# Patient Record
Sex: Male | Born: 1955 | Race: Black or African American | Hispanic: No | Marital: Single | Smoking: Former smoker
Health system: Southern US, Community
[De-identification: ages and names within clinical notes are randomized; demographics above are authoritative.]

## PROBLEM LIST (undated history)

## (undated) DIAGNOSIS — Z741 Need for assistance with personal care: Secondary | ICD-10-CM

## (undated) DIAGNOSIS — J154 Pneumonia due to other streptococci: Secondary | ICD-10-CM

## (undated) DIAGNOSIS — I739 Peripheral vascular disease, unspecified: Secondary | ICD-10-CM

## (undated) DIAGNOSIS — I509 Heart failure, unspecified: Secondary | ICD-10-CM

## (undated) DIAGNOSIS — E875 Hyperkalemia: Secondary | ICD-10-CM

## (undated) DIAGNOSIS — Z9119 Patient's noncompliance with other medical treatment and regimen: Secondary | ICD-10-CM

## (undated) DIAGNOSIS — I639 Cerebral infarction, unspecified: Secondary | ICD-10-CM

## (undated) DIAGNOSIS — N400 Enlarged prostate without lower urinary tract symptoms: Secondary | ICD-10-CM

## (undated) DIAGNOSIS — R339 Retention of urine, unspecified: Secondary | ICD-10-CM

## (undated) DIAGNOSIS — J189 Pneumonia, unspecified organism: Secondary | ICD-10-CM

## (undated) DIAGNOSIS — F1910 Other psychoactive substance abuse, uncomplicated: Secondary | ICD-10-CM

## (undated) DIAGNOSIS — N179 Acute kidney failure, unspecified: Secondary | ICD-10-CM

## (undated) DIAGNOSIS — Z993 Dependence on wheelchair: Secondary | ICD-10-CM

## (undated) DIAGNOSIS — Z91199 Patient's noncompliance with other medical treatment and regimen due to unspecified reason: Secondary | ICD-10-CM

## (undated) DIAGNOSIS — I10 Essential (primary) hypertension: Secondary | ICD-10-CM

## (undated) DIAGNOSIS — I96 Gangrene, not elsewhere classified: Secondary | ICD-10-CM

## (undated) DIAGNOSIS — D649 Anemia, unspecified: Secondary | ICD-10-CM

## (undated) DIAGNOSIS — E785 Hyperlipidemia, unspecified: Secondary | ICD-10-CM

## (undated) DIAGNOSIS — Z89619 Acquired absence of unspecified leg above knee: Secondary | ICD-10-CM

## (undated) HISTORY — DX: Retention of urine, unspecified: R33.9

## (undated) HISTORY — DX: Hyperkalemia: E87.5

## (undated) HISTORY — PX: JOINT REPLACEMENT: SHX530

## (undated) HISTORY — DX: Acute kidney failure, unspecified: N17.9

## (undated) HISTORY — DX: Gangrene, not elsewhere classified: I96

## (undated) HISTORY — DX: Anemia, unspecified: D64.9

## (undated) HISTORY — PX: TOTAL HIP ARTHROPLASTY: SHX124

## (undated) HISTORY — DX: Pneumonia due to other streptococci: J15.4

## (undated) HISTORY — DX: Hyperlipidemia, unspecified: E78.5

## (undated) HISTORY — DX: Peripheral vascular disease, unspecified: I73.9

---

## 2000-08-25 ENCOUNTER — Encounter: Admission: RE | Admit: 2000-08-25 | Discharge: 2000-08-25 | Payer: Self-pay | Admitting: Internal Medicine

## 2001-05-18 ENCOUNTER — Emergency Department (HOSPITAL_COMMUNITY): Admission: EM | Admit: 2001-05-18 | Discharge: 2001-05-18 | Payer: Self-pay | Admitting: Emergency Medicine

## 2002-03-18 ENCOUNTER — Emergency Department (HOSPITAL_COMMUNITY): Admission: EM | Admit: 2002-03-18 | Discharge: 2002-03-18 | Payer: Self-pay | Admitting: Emergency Medicine

## 2002-03-18 ENCOUNTER — Encounter: Payer: Self-pay | Admitting: Emergency Medicine

## 2003-03-03 ENCOUNTER — Emergency Department (HOSPITAL_COMMUNITY): Admission: EM | Admit: 2003-03-03 | Discharge: 2003-03-04 | Payer: Self-pay

## 2003-03-23 ENCOUNTER — Emergency Department (HOSPITAL_COMMUNITY): Admission: EM | Admit: 2003-03-23 | Discharge: 2003-03-23 | Payer: Self-pay | Admitting: Emergency Medicine

## 2003-10-31 ENCOUNTER — Emergency Department (HOSPITAL_COMMUNITY): Admission: EM | Admit: 2003-10-31 | Discharge: 2003-10-31 | Payer: Self-pay | Admitting: Emergency Medicine

## 2004-01-24 ENCOUNTER — Emergency Department (HOSPITAL_COMMUNITY): Admission: EM | Admit: 2004-01-24 | Discharge: 2004-01-24 | Payer: Self-pay | Admitting: Emergency Medicine

## 2006-03-13 ENCOUNTER — Emergency Department (HOSPITAL_COMMUNITY): Admission: EM | Admit: 2006-03-13 | Discharge: 2006-03-13 | Payer: Self-pay | Admitting: Emergency Medicine

## 2006-12-09 ENCOUNTER — Emergency Department (HOSPITAL_COMMUNITY): Admission: EM | Admit: 2006-12-09 | Discharge: 2006-12-09 | Payer: Self-pay | Admitting: Emergency Medicine

## 2007-01-03 ENCOUNTER — Emergency Department (HOSPITAL_COMMUNITY): Admission: EM | Admit: 2007-01-03 | Discharge: 2007-01-03 | Payer: Self-pay | Admitting: Family Medicine

## 2007-02-19 ENCOUNTER — Emergency Department (HOSPITAL_COMMUNITY): Admission: EM | Admit: 2007-02-19 | Discharge: 2007-02-19 | Payer: Self-pay | Admitting: Emergency Medicine

## 2007-03-02 ENCOUNTER — Emergency Department (HOSPITAL_COMMUNITY): Admission: EM | Admit: 2007-03-02 | Discharge: 2007-03-02 | Payer: Self-pay | Admitting: Family Medicine

## 2007-08-28 ENCOUNTER — Emergency Department (HOSPITAL_COMMUNITY): Admission: EM | Admit: 2007-08-28 | Discharge: 2007-08-28 | Payer: Self-pay | Admitting: Emergency Medicine

## 2007-11-28 ENCOUNTER — Emergency Department (HOSPITAL_COMMUNITY): Admission: EM | Admit: 2007-11-28 | Discharge: 2007-11-28 | Payer: Self-pay | Admitting: Emergency Medicine

## 2010-01-21 ENCOUNTER — Inpatient Hospital Stay (HOSPITAL_COMMUNITY)
Admission: EM | Admit: 2010-01-21 | Discharge: 2010-02-05 | Payer: Self-pay | Source: Home / Self Care | Attending: Internal Medicine | Admitting: Internal Medicine

## 2010-01-22 ENCOUNTER — Encounter (INDEPENDENT_AMBULATORY_CARE_PROVIDER_SITE_OTHER): Payer: Self-pay | Admitting: Internal Medicine

## 2010-03-02 NOTE — Consult Note (Addendum)
NAME:  Jeremy, Howell NO.:  [id number]  MEDICAL RECORD NO.:  [id number]           PATIENT TYPE:  LOCATION:                                 FACILITY:  PHYSICIAN:  Bevelyn Buckles. Bensimhon, MDDATE OF BIRTH:  Apr 04, 1955  DATE OF CONSULTATION:  01/22/2010 DATE OF DISCHARGE:                                CONSULTATION   PRIMARY CARE PHYSICIAN:  Fleet Contras, MD  PRIMARY CARDIOLOGIST:  None.  CHIEF COMPLAINT:  Possible MI.  HISTORY OF PRESENT ILLNESS:  Jeremy Howell is a 55 year old male with no previous history of coronary artery disease.  He has had an abnormal EKG for several years.  He was admitted yesterday with a CVA.  His troponin was elevated, so Cardiology was asked to evaluate him.  Jeremy Howell is awake and seems to say yes or no answers to questions. However, the answers are not necessarily responsive to the questions. He does deny chest pain or shortness of breath.  He denies any pain at all.  He responds to some simple commands.  Most of the data was obtained from the chart and some of it from the patient.  Family is not present.  PAST MEDICAL HISTORY: 1. Hypertension. 2. Polysubstance abuse with cocaine, heroin, THC, and alcohol. 3. History of tobacco use. 4. History of noncompliance with medications.  SURGICAL HISTORY:  Hip replacement.  ALLERGIES:  No known drug allergies per old records.  CURRENT MEDICATIONS: 1. Aspirin 325 mg daily. 2. Metoprolol 25 mg b.i.d. 3. Avelox 400 mg daily. 4. IV nitroglycerin at 45 mcg per minute. 5. Thiamine 50 mg IV daily.  SOCIAL HISTORY:  He lives in [place] with family.  He is unemployed. He has a greater than 30 pack-year history of tobacco use as well as ongoing alcohol or drug abuse.  FAMILY HISTORY:  The patient indicates that both his parents are deceased and neither of his parents nor any siblings have heart disease.  REVIEW OF SYSTEMS:  Essentially not obtainable because of the  patient's condition.  PHYSICAL EXAMINATION:  VITAL SIGNS:  Temperature 99.2, blood pressure 178/88, pulse 64, respiratory rate 14, O2 saturation 98% on room air. GENERAL:  He is a slender [place] American male who responds to verbal stimulus. HEENT:  Essentially normal with the exception of a possible left facial droop. NECK:  There is no lymphadenopathy, thyromegaly, or bruit, and JVD is approximately 8 cm. CVA:  His heart is regular in rate and rhythm with an S1 and S2 and no significant murmur, rub, or gallop is noted.  Distal pulses are intact in all four extremities. LUNGS:  Clear to auscultation anteriorly. SKIN:  No rashes or lesions are noted. ABDOMEN:  Soft and nontender with active bowel sounds. EXTREMITIES:  There is no cyanosis, clubbing, or edema noted. MUSCULOSKELETAL:  There is no joint deformity or effusions. NEUROLOGIC:  He is awake and moves all four extremities.  He indicated that he did not know where he was.  There is possibly a slight left facial droop and he seems to have left-sided weakness.  Chest x-ray, possible infiltrate in the left lower lobe but no  edema.  CT of the head shows a relatively acute infarct in the left lateral temporal lobes with mass effect as well as a small focus of encephalomalacia at the high right parietal lobe that likely reflects a prior infarct.  EKG, sinus rhythm, rate 60, with deeper T-wave inversions from an EKG dated 2009.  LABORATORY VALUES:  Hemoglobin 14.2, hematocrit 43.6, WBCs 3.7, platelets 282,000.  Sodium 135, potassium 3.5, chloride 104, CO2 24, BUN 11, creatinine 1.4, glucose 81.  EtOH 18.  Urine drug screen positive for cocaine, opiates, and THC.  CK-MB #1 85/4.0, then 79/2.8; troponin-I 0.38, then 0.54.  Total cholesterol 189, triglycerides 55, HDL 42 LDL 136.  IMPRESSION:  Jeremy Howell was seen today by Dr. Gala Romney, the patient evaluated and he data reviewed.  He is a 55 year old male with a history of  hypertension and polysubstance abuse who is admitted with an acute cerebrovascular accident.  His troponin is mildly elevated.  His EKG shows left ventricular hypertrophy with repolarization changes.  He has marked worsening of T-waves and we suspect that the troponin increase in the T-waves are secondary to the cerebrovascular accident though we cannot fully exclude some myocardial ischemia.  That said, he is chest pain free so we would not pursue invasive workup in the setting of a CVA.  He has been appropriately placed on aspirin.  A beta-blocker is also appropriate, but Coreg or labetalol would be better because they have both alpha and beta-blocking effects.  We will therefore change him to Coreg.  We will follow up on the echo that has already been ordered and add a statin.  Consideration can be given to a Lexiscan Myoview prior to discharge if he complains of ischemic symptoms or his enzymes show additional increases.    Jeremy Demark, PA-C   ______________________________ Bevelyn Buckles. Bensimhon, MD   RB/MEDQ  D:  01/22/2010  T:  01/23/2010  Job:  [id number]  Electronically Signed by Jeremy Demark PA-C on 02/20/2010 05:01:28 PM Electronically Signed by Arvilla Meres MD on 03/02/2010 07:12:46 PM

## 2010-04-14 ENCOUNTER — Ambulatory Visit: Payer: Medicare Other

## 2010-04-14 ENCOUNTER — Ambulatory Visit: Payer: Medicare Other | Attending: Neurology | Admitting: Rehabilitative and Restorative Service Providers"

## 2010-04-14 DIAGNOSIS — Z5189 Encounter for other specified aftercare: Secondary | ICD-10-CM | POA: Insufficient documentation

## 2010-04-14 DIAGNOSIS — M6281 Muscle weakness (generalized): Secondary | ICD-10-CM | POA: Insufficient documentation

## 2010-04-14 DIAGNOSIS — I69998 Other sequelae following unspecified cerebrovascular disease: Secondary | ICD-10-CM | POA: Insufficient documentation

## 2010-04-14 DIAGNOSIS — M2420 Disorder of ligament, unspecified site: Secondary | ICD-10-CM | POA: Insufficient documentation

## 2010-04-14 DIAGNOSIS — M2550 Pain in unspecified joint: Secondary | ICD-10-CM | POA: Insufficient documentation

## 2010-04-14 DIAGNOSIS — I69920 Aphasia following unspecified cerebrovascular disease: Secondary | ICD-10-CM | POA: Insufficient documentation

## 2010-04-14 DIAGNOSIS — R269 Unspecified abnormalities of gait and mobility: Secondary | ICD-10-CM | POA: Insufficient documentation

## 2010-04-14 DIAGNOSIS — M2560 Stiffness of unspecified joint, not elsewhere classified: Secondary | ICD-10-CM | POA: Insufficient documentation

## 2010-04-14 DIAGNOSIS — M629 Disorder of muscle, unspecified: Secondary | ICD-10-CM | POA: Insufficient documentation

## 2010-04-15 ENCOUNTER — Ambulatory Visit: Payer: Medicare Other | Admitting: Occupational Therapy

## 2010-04-21 LAB — BLOOD GAS, ARTERIAL
Acid-base deficit: 0.9 mmol/L (ref 0.0–2.0)
Drawn by: 129711
O2 Saturation: 97.3 %
TCO2: 23.5 mmol/L (ref 0–100)
pO2, Arterial: 90.9 mmHg (ref 80.0–100.0)

## 2010-04-21 LAB — GLUCOSE, CAPILLARY
Comment 2: 282828
Glucose-Capillary: 100 mg/dL — ABNORMAL HIGH (ref 70–99)
Glucose-Capillary: 101 mg/dL — ABNORMAL HIGH (ref 70–99)
Glucose-Capillary: 103 mg/dL — ABNORMAL HIGH (ref 70–99)
Glucose-Capillary: 107 mg/dL — ABNORMAL HIGH (ref 70–99)
Glucose-Capillary: 109 mg/dL — ABNORMAL HIGH (ref 70–99)
Glucose-Capillary: 112 mg/dL — ABNORMAL HIGH (ref 70–99)
Glucose-Capillary: 113 mg/dL — ABNORMAL HIGH (ref 70–99)
Glucose-Capillary: 114 mg/dL — ABNORMAL HIGH (ref 70–99)
Glucose-Capillary: 116 mg/dL — ABNORMAL HIGH (ref 70–99)
Glucose-Capillary: 117 mg/dL — ABNORMAL HIGH (ref 70–99)
Glucose-Capillary: 131 mg/dL — ABNORMAL HIGH (ref 70–99)
Glucose-Capillary: 154 mg/dL — ABNORMAL HIGH (ref 70–99)
Glucose-Capillary: 256 mg/dL — ABNORMAL HIGH (ref 70–99)
Glucose-Capillary: 74 mg/dL (ref 70–99)
Glucose-Capillary: 78 mg/dL (ref 70–99)
Glucose-Capillary: 82 mg/dL (ref 70–99)
Glucose-Capillary: 87 mg/dL (ref 70–99)
Glucose-Capillary: 90 mg/dL (ref 70–99)
Glucose-Capillary: 96 mg/dL (ref 70–99)
Glucose-Capillary: 97 mg/dL (ref 70–99)
Glucose-Capillary: 99 mg/dL (ref 70–99)

## 2010-04-21 LAB — DIFFERENTIAL
Basophils Absolute: 0 10*3/uL (ref 0.0–0.1)
Eosinophils Relative: 2 % (ref 0–5)
Lymphocytes Relative: 38 % (ref 12–46)
Lymphs Abs: 1.4 10*3/uL (ref 0.7–4.0)
Monocytes Absolute: 0.4 10*3/uL (ref 0.1–1.0)
Monocytes Relative: 12 % (ref 3–12)

## 2010-04-21 LAB — CARDIAC PANEL(CRET KIN+CKTOT+MB+TROPI)
CK, MB: 1.8 ng/mL (ref 0.3–4.0)
CK, MB: 1.9 ng/mL (ref 0.3–4.0)
CK, MB: 1.9 ng/mL (ref 0.3–4.0)
CK, MB: 2.8 ng/mL (ref 0.3–4.0)
Relative Index: INVALID (ref 0.0–2.5)
Relative Index: INVALID (ref 0.0–2.5)
Total CK: 78 U/L (ref 7–232)
Total CK: 79 U/L (ref 7–232)
Troponin I: 0.32 ng/mL — ABNORMAL HIGH (ref 0.00–0.06)
Troponin I: 0.54 ng/mL (ref 0.00–0.06)

## 2010-04-21 LAB — BASIC METABOLIC PANEL
BUN: 12 mg/dL (ref 6–23)
BUN: 26 mg/dL — ABNORMAL HIGH (ref 6–23)
BUN: 28 mg/dL — ABNORMAL HIGH (ref 6–23)
BUN: 8 mg/dL (ref 6–23)
CO2: 21 mEq/L (ref 19–32)
CO2: 21 mEq/L (ref 19–32)
CO2: 22 mEq/L (ref 19–32)
CO2: 24 mEq/L (ref 19–32)
CO2: 24 mEq/L (ref 19–32)
CO2: 24 mEq/L (ref 19–32)
CO2: 27 mEq/L (ref 19–32)
Calcium: 8.9 mg/dL (ref 8.4–10.5)
Calcium: 9.2 mg/dL (ref 8.4–10.5)
Calcium: 9.3 mg/dL (ref 8.4–10.5)
Calcium: 9.5 mg/dL (ref 8.4–10.5)
Calcium: 9.6 mg/dL (ref 8.4–10.5)
Calcium: 9.6 mg/dL (ref 8.4–10.5)
Chloride: 105 mEq/L (ref 96–112)
Chloride: 108 mEq/L (ref 96–112)
Chloride: 109 mEq/L (ref 96–112)
Chloride: 111 mEq/L (ref 96–112)
Chloride: 112 mEq/L (ref 96–112)
Creatinine, Ser: 1.03 mg/dL (ref 0.4–1.5)
Creatinine, Ser: 1.18 mg/dL (ref 0.4–1.5)
Creatinine, Ser: 1.22 mg/dL (ref 0.4–1.5)
Creatinine, Ser: 1.33 mg/dL (ref 0.4–1.5)
Creatinine, Ser: 1.36 mg/dL (ref 0.4–1.5)
Creatinine, Ser: 1.72 mg/dL — ABNORMAL HIGH (ref 0.4–1.5)
Creatinine, Ser: 1.76 mg/dL — ABNORMAL HIGH (ref 0.4–1.5)
GFR calc Af Amer: 49 mL/min — ABNORMAL LOW (ref >60)
GFR calc Af Amer: 50 mL/min — ABNORMAL LOW (ref >60)
GFR calc Af Amer: >60 mL/min (ref >60)
GFR calc Af Amer: >60 mL/min (ref >60)
GFR calc non Af Amer: 40 mL/min — ABNORMAL LOW (ref >60)
GFR calc non Af Amer: >60 mL/min (ref >60)
Glucose, Bld: 100 mg/dL — ABNORMAL HIGH (ref 70–99)
Glucose, Bld: 104 mg/dL — ABNORMAL HIGH (ref 70–99)
Glucose, Bld: 81 mg/dL (ref 70–99)
Glucose, Bld: 87 mg/dL (ref 70–99)
Glucose, Bld: 93 mg/dL (ref 70–99)
Glucose, Bld: 95 mg/dL (ref 70–99)
Potassium: 3.7 mEq/L (ref 3.5–5.1)
Potassium: 4.1 mEq/L (ref 3.5–5.1)
Sodium: 140 mEq/L (ref 135–145)
Sodium: 143 mEq/L (ref 135–145)
Sodium: 146 mEq/L — ABNORMAL HIGH (ref 135–145)

## 2010-04-21 LAB — CBC
HCT: 48.7 % (ref 39.0–52.0)
HCT: 50.2 % (ref 39.0–52.0)
HCT: 43.6 % (ref 39.0–52.0)
Hemoglobin: 16.8 g/dL (ref 13.0–17.0)
MCH: 27.1 pg (ref 26.0–34.0)
MCH: 27.2 pg (ref 26.0–34.0)
MCH: 27.4 pg (ref 26.0–34.0)
MCHC: 33.5 g/dL (ref 30.0–36.0)
MCHC: 34 g/dL (ref 30.0–36.0)
MCHC: 34.2 g/dL (ref 30.0–36.0)
MCHC: 34.2 g/dL (ref 30.0–36.0)
MCV: 80.1 fL (ref 78.0–100.0)
MCV: 80.2 fL (ref 78.0–100.0)
MCV: 80.3 fL (ref 78.0–100.0)
MCV: 81.6 fL (ref 78.0–100.0)
Platelets: 319 10*3/uL (ref 150–400)
Platelets: 337 10*3/uL (ref 150–400)
Platelets: 366 10*3/uL (ref 150–400)
Platelets: 282 10*3/uL (ref 150–400)
RBC: 5.83 MIL/uL — ABNORMAL HIGH (ref 4.22–5.81)
RBC: 6.2 MIL/uL — ABNORMAL HIGH (ref 4.22–5.81)
RBC: 5.34 MIL/uL (ref 4.22–5.81)
RDW: 14.4 % (ref 11.5–15.5)
RDW: 14.4 % (ref 11.5–15.5)
RDW: 14.5 % (ref 11.5–15.5)
WBC: 4.4 10*3/uL (ref 4.0–10.5)
WBC: 5.8 10*3/uL (ref 4.0–10.5)
WBC: 3.7 10*3/uL — ABNORMAL LOW (ref 4.0–10.5)

## 2010-04-21 LAB — COMPREHENSIVE METABOLIC PANEL
AST: 17 U/L (ref 0–37)
Albumin: 3.6 g/dL (ref 3.5–5.2)
Chloride: 104 mEq/L (ref 96–112)
Creatinine, Ser: 1.4 mg/dL (ref 0.4–1.5)
GFR calc Af Amer: >60 mL/min (ref >60)
Total Bilirubin: 0.4 mg/dL (ref 0.3–1.2)

## 2010-04-21 LAB — URINE CULTURE
Colony Count: NO GROWTH
Culture  Setup Time: 201112140049
Culture: NO GROWTH

## 2010-04-21 LAB — CULTURE, BLOOD (ROUTINE X 2)
Culture  Setup Time: 201112141949
Culture: NO GROWTH

## 2010-04-21 LAB — TROPONIN I: Troponin I: 0.38 ng/mL — ABNORMAL HIGH (ref 0.00–0.06)

## 2010-04-21 LAB — LACTIC ACID, PLASMA: Lactic Acid, Venous: 2 mmol/L (ref 0.5–2.2)

## 2010-04-21 LAB — APTT: aPTT: 31 seconds (ref 24–37)

## 2010-04-21 LAB — LIPID PANEL
HDL: 42 mg/dL (ref >39)
Total CHOL/HDL Ratio: 4.5 RATIO
VLDL: 11 mg/dL (ref 0–40)

## 2010-04-21 LAB — VITAMIN B12: Vitamin B-12: 1023 pg/mL — ABNORMAL HIGH (ref 211–911)

## 2010-04-21 LAB — RAPID URINE DRUG SCREEN, HOSP PERFORMED
Cocaine: POSITIVE — AB
Opiates: POSITIVE — AB

## 2010-04-21 LAB — POCT I-STAT 3, VENOUS BLOOD GAS (G3P V)
pCO2, Ven: 42 mmHg — ABNORMAL LOW (ref 45.0–50.0)
pH, Ven: 7.386 — ABNORMAL HIGH (ref 7.250–7.300)

## 2010-04-21 LAB — MRSA PCR SCREENING: MRSA by PCR: NEGATIVE

## 2010-04-21 LAB — URINALYSIS, ROUTINE W REFLEX MICROSCOPIC
Glucose, UA: NEGATIVE mg/dL
Ketones, ur: NEGATIVE mg/dL
pH: 5.5 (ref 5.0–8.0)

## 2010-04-21 LAB — MAGNESIUM: Magnesium: 2.3 mg/dL (ref 1.5–2.5)

## 2010-04-21 LAB — HEPATITIS C ANTIBODY: HCV Ab: NEGATIVE

## 2010-04-21 LAB — CK TOTAL AND CKMB (NOT AT ARMC): CK, MB: 4 ng/mL (ref 0.3–4.0)

## 2010-04-21 LAB — TSH: TSH: 1.296 u[IU]/mL (ref 0.350–4.500)

## 2010-04-21 LAB — HEPATITIS B CORE ANTIBODY, TOTAL: Hep B Core Total Ab: NEGATIVE

## 2010-04-25 ENCOUNTER — Ambulatory Visit (HOSPITAL_COMMUNITY)
Admission: RE | Admit: 2010-04-25 | Discharge: 2010-04-25 | Disposition: A | Discharge status: Home / Self Care | Payer: Medicare Other | Source: Ambulatory Visit | Attending: Internal Medicine | Admitting: Internal Medicine

## 2010-04-25 ENCOUNTER — Other Ambulatory Visit (HOSPITAL_COMMUNITY): Payer: Self-pay | Admitting: Internal Medicine

## 2010-04-25 DIAGNOSIS — M7989 Other specified soft tissue disorders: Secondary | ICD-10-CM | POA: Insufficient documentation

## 2010-04-25 DIAGNOSIS — M79609 Pain in unspecified limb: Secondary | ICD-10-CM | POA: Insufficient documentation

## 2010-04-25 DIAGNOSIS — R52 Pain, unspecified: Secondary | ICD-10-CM

## 2010-04-25 DIAGNOSIS — M2540 Effusion, unspecified joint: Secondary | ICD-10-CM

## 2010-04-28 ENCOUNTER — Ambulatory Visit: Payer: Medicare Other

## 2010-04-28 ENCOUNTER — Ambulatory Visit: Payer: Medicare Other | Admitting: Occupational Therapy

## 2010-04-28 ENCOUNTER — Ambulatory Visit: Payer: Medicare Other | Admitting: Rehabilitative and Restorative Service Providers"

## 2010-04-29 ENCOUNTER — Ambulatory Visit: Payer: Medicare Other

## 2010-04-29 ENCOUNTER — Ambulatory Visit: Payer: Medicare Other | Admitting: Rehabilitative and Restorative Service Providers"

## 2010-04-29 ENCOUNTER — Ambulatory Visit: Payer: Medicare Other | Admitting: Occupational Therapy

## 2010-05-05 ENCOUNTER — Ambulatory Visit: Payer: Medicare Other | Admitting: Rehabilitative and Restorative Service Providers"

## 2010-05-05 ENCOUNTER — Ambulatory Visit: Payer: Medicare Other | Admitting: Occupational Therapy

## 2010-05-05 ENCOUNTER — Ambulatory Visit: Payer: Medicare Other | Admitting: Speech Pathology

## 2010-05-06 ENCOUNTER — Ambulatory Visit: Payer: Medicare Other | Admitting: Rehabilitative and Restorative Service Providers"

## 2010-05-06 ENCOUNTER — Ambulatory Visit: Payer: Medicare Other

## 2010-05-06 ENCOUNTER — Ambulatory Visit: Payer: Medicare Other | Admitting: Occupational Therapy

## 2010-05-07 ENCOUNTER — Encounter: Payer: Medicare Other | Admitting: Occupational Therapy

## 2010-05-12 ENCOUNTER — Ambulatory Visit: Payer: Medicare Other

## 2010-05-12 ENCOUNTER — Ambulatory Visit: Payer: Medicare Other | Attending: Neurology | Admitting: Rehabilitative and Restorative Service Providers"

## 2010-05-12 ENCOUNTER — Ambulatory Visit: Payer: Medicare Other | Admitting: Occupational Therapy

## 2010-05-12 DIAGNOSIS — M2560 Stiffness of unspecified joint, not elsewhere classified: Secondary | ICD-10-CM | POA: Insufficient documentation

## 2010-05-12 DIAGNOSIS — M629 Disorder of muscle, unspecified: Secondary | ICD-10-CM | POA: Insufficient documentation

## 2010-05-12 DIAGNOSIS — Z5189 Encounter for other specified aftercare: Secondary | ICD-10-CM | POA: Insufficient documentation

## 2010-05-12 DIAGNOSIS — M2550 Pain in unspecified joint: Secondary | ICD-10-CM | POA: Insufficient documentation

## 2010-05-12 DIAGNOSIS — I69998 Other sequelae following unspecified cerebrovascular disease: Secondary | ICD-10-CM | POA: Insufficient documentation

## 2010-05-12 DIAGNOSIS — R269 Unspecified abnormalities of gait and mobility: Secondary | ICD-10-CM | POA: Insufficient documentation

## 2010-05-12 DIAGNOSIS — I69920 Aphasia following unspecified cerebrovascular disease: Secondary | ICD-10-CM | POA: Insufficient documentation

## 2010-05-12 DIAGNOSIS — M2420 Disorder of ligament, unspecified site: Secondary | ICD-10-CM | POA: Insufficient documentation

## 2010-05-12 DIAGNOSIS — M6281 Muscle weakness (generalized): Secondary | ICD-10-CM | POA: Insufficient documentation

## 2010-05-14 ENCOUNTER — Encounter: Payer: Medicare Other | Admitting: Occupational Therapy

## 2010-05-14 ENCOUNTER — Ambulatory Visit: Payer: Medicare Other | Admitting: Physical Therapy

## 2010-05-19 ENCOUNTER — Ambulatory Visit: Payer: Medicare Other | Admitting: Occupational Therapy

## 2010-05-19 ENCOUNTER — Ambulatory Visit: Payer: Medicare Other

## 2010-05-19 ENCOUNTER — Ambulatory Visit: Payer: Medicare Other | Admitting: Rehabilitative and Restorative Service Providers"

## 2010-05-27 ENCOUNTER — Encounter: Payer: Medicare Other | Admitting: Occupational Therapy

## 2010-05-27 ENCOUNTER — Ambulatory Visit: Payer: Medicare Other | Admitting: Rehabilitative and Restorative Service Providers"

## 2010-05-27 ENCOUNTER — Ambulatory Visit: Payer: Medicare Other

## 2010-05-27 ENCOUNTER — Ambulatory Visit: Payer: Medicare Other | Admitting: Occupational Therapy

## 2010-05-29 ENCOUNTER — Ambulatory Visit: Payer: Medicare Other

## 2010-05-29 ENCOUNTER — Ambulatory Visit: Payer: Medicare Other | Admitting: Occupational Therapy

## 2010-05-30 ENCOUNTER — Ambulatory Visit: Payer: Medicare Other | Admitting: Rehabilitative and Restorative Service Providers"

## 2010-06-03 ENCOUNTER — Ambulatory Visit: Payer: Medicare Other | Admitting: Rehabilitative and Restorative Service Providers"

## 2010-06-03 ENCOUNTER — Ambulatory Visit: Payer: Medicare Other | Admitting: Occupational Therapy

## 2010-06-03 ENCOUNTER — Ambulatory Visit: Payer: Medicare Other

## 2010-06-06 ENCOUNTER — Ambulatory Visit: Payer: Medicare Other | Admitting: Rehabilitative and Restorative Service Providers"

## 2010-06-06 ENCOUNTER — Ambulatory Visit: Payer: Medicare Other | Admitting: Occupational Therapy

## 2010-06-09 ENCOUNTER — Ambulatory Visit: Payer: Medicare Other | Admitting: Rehabilitative and Restorative Service Providers"

## 2010-06-09 ENCOUNTER — Ambulatory Visit: Payer: Medicare Other | Admitting: Speech Pathology

## 2010-06-09 ENCOUNTER — Encounter: Payer: Medicare Other | Admitting: Occupational Therapy

## 2010-06-13 ENCOUNTER — Ambulatory Visit: Payer: Medicare Other | Attending: Neurology

## 2010-06-13 ENCOUNTER — Encounter: Payer: Medicare Other | Admitting: Occupational Therapy

## 2010-06-13 ENCOUNTER — Ambulatory Visit: Payer: Medicare Other | Admitting: Rehabilitative and Restorative Service Providers"

## 2010-06-13 DIAGNOSIS — M2550 Pain in unspecified joint: Secondary | ICD-10-CM | POA: Insufficient documentation

## 2010-06-13 DIAGNOSIS — M2420 Disorder of ligament, unspecified site: Secondary | ICD-10-CM | POA: Insufficient documentation

## 2010-06-13 DIAGNOSIS — M629 Disorder of muscle, unspecified: Secondary | ICD-10-CM | POA: Insufficient documentation

## 2010-06-13 DIAGNOSIS — R269 Unspecified abnormalities of gait and mobility: Secondary | ICD-10-CM | POA: Insufficient documentation

## 2010-06-13 DIAGNOSIS — M2560 Stiffness of unspecified joint, not elsewhere classified: Secondary | ICD-10-CM | POA: Insufficient documentation

## 2010-06-13 DIAGNOSIS — I69998 Other sequelae following unspecified cerebrovascular disease: Secondary | ICD-10-CM | POA: Insufficient documentation

## 2010-06-13 DIAGNOSIS — I69920 Aphasia following unspecified cerebrovascular disease: Secondary | ICD-10-CM | POA: Insufficient documentation

## 2010-06-13 DIAGNOSIS — M6281 Muscle weakness (generalized): Secondary | ICD-10-CM | POA: Insufficient documentation

## 2010-06-13 DIAGNOSIS — Z5189 Encounter for other specified aftercare: Secondary | ICD-10-CM | POA: Insufficient documentation

## 2010-06-16 ENCOUNTER — Ambulatory Visit: Payer: Medicare Other | Admitting: Rehabilitative and Restorative Service Providers"

## 2010-06-16 ENCOUNTER — Encounter: Payer: Medicare Other | Admitting: Occupational Therapy

## 2010-06-16 ENCOUNTER — Ambulatory Visit: Payer: Medicare Other

## 2010-06-18 ENCOUNTER — Encounter: Payer: Medicare Other | Admitting: Occupational Therapy

## 2010-06-18 ENCOUNTER — Ambulatory Visit: Payer: Medicare Other | Admitting: Rehabilitative and Restorative Service Providers"

## 2010-06-18 ENCOUNTER — Ambulatory Visit: Payer: Medicare Other

## 2010-11-07 LAB — POCT I-STAT, CHEM 8
BUN: 9
Calcium, Ion: 1.12
Chloride: 107
HCT: 52
Potassium: 4
Sodium: 142

## 2010-11-07 LAB — CBC
Hemoglobin: 15.8
MCHC: 33.2
RDW: 15.5

## 2010-11-07 LAB — POCT CARDIAC MARKERS
CKMB, poc: <1 — ABNORMAL LOW
Myoglobin, poc: 31.4
Operator id: 294521
Troponin i, poc: <0.05

## 2010-11-07 LAB — DIFFERENTIAL
Basophils Absolute: 0
Basophils Relative: 1
Lymphocytes Relative: 33
Monocytes Absolute: 0.3
Neutro Abs: 2.5
Neutrophils Relative %: 55

## 2010-11-18 LAB — CBC
MCHC: 33.2
Platelets: 375
RDW: 14.2

## 2010-11-18 LAB — RAPID URINE DRUG SCREEN, HOSP PERFORMED
Amphetamines: NOT DETECTED
Barbiturates: NOT DETECTED
Benzodiazepines: NOT DETECTED
Cocaine: POSITIVE — AB
Opiates: POSITIVE — AB
Tetrahydrocannabinol: NOT DETECTED

## 2010-11-18 LAB — POCT I-STAT CREATININE: Operator id: 294501

## 2010-11-18 LAB — DIFFERENTIAL
Basophils Absolute: 0
Basophils Relative: 1
Lymphocytes Relative: 42
Monocytes Absolute: 0.4
Neutro Abs: 1.6 — ABNORMAL LOW
Neutrophils Relative %: 41 — ABNORMAL LOW

## 2010-11-18 LAB — URINALYSIS, ROUTINE W REFLEX MICROSCOPIC
Bilirubin Urine: NEGATIVE
Glucose, UA: NEGATIVE
Hgb urine dipstick: NEGATIVE
Specific Gravity, Urine: 1.018

## 2010-11-18 LAB — I-STAT 8, (EC8 V) (CONVERTED LAB)
BUN: 17
Bicarbonate: 29.2 — ABNORMAL HIGH
Glucose, Bld: 89
HCT: 47
Hemoglobin: 16
Operator id: 294501
Sodium: 140
TCO2: 31
pCO2, Ven: 56.4 — ABNORMAL HIGH

## 2010-11-18 LAB — POCT CARDIAC MARKERS
CKMB, poc: 1.1
Myoglobin, poc: 48.4
Operator id: 294501
Troponin i, poc: <0.05

## 2010-11-18 LAB — B-NATRIURETIC PEPTIDE (CONVERTED LAB): Pro B Natriuretic peptide (BNP): 131 — ABNORMAL HIGH

## 2011-02-11 ENCOUNTER — Other Ambulatory Visit: Payer: Self-pay

## 2011-02-11 ENCOUNTER — Encounter: Payer: Self-pay | Admitting: *Deleted

## 2011-02-11 ENCOUNTER — Emergency Department (HOSPITAL_COMMUNITY): Payer: Medicare Other

## 2011-02-11 ENCOUNTER — Inpatient Hospital Stay (HOSPITAL_COMMUNITY)
Admission: EM | Admit: 2011-02-11 | Discharge: 2011-02-15 | DRG: 305 | Disposition: A | Discharge status: Home / Self Care | Payer: Medicare Other | Source: Ambulatory Visit | Attending: Internal Medicine | Admitting: Internal Medicine

## 2011-02-11 DIAGNOSIS — F1210 Cannabis abuse, uncomplicated: Secondary | ICD-10-CM | POA: Diagnosis present

## 2011-02-11 DIAGNOSIS — R4701 Aphasia: Secondary | ICD-10-CM

## 2011-02-11 DIAGNOSIS — F1010 Alcohol abuse, uncomplicated: Secondary | ICD-10-CM | POA: Diagnosis present

## 2011-02-11 DIAGNOSIS — I69920 Aphasia following unspecified cerebrovascular disease: Secondary | ICD-10-CM

## 2011-02-11 DIAGNOSIS — I161 Hypertensive emergency: Secondary | ICD-10-CM

## 2011-02-11 DIAGNOSIS — R7303 Prediabetes: Secondary | ICD-10-CM

## 2011-02-11 DIAGNOSIS — E876 Hypokalemia: Secondary | ICD-10-CM | POA: Diagnosis not present

## 2011-02-11 DIAGNOSIS — F1110 Opioid abuse, uncomplicated: Secondary | ICD-10-CM | POA: Diagnosis present

## 2011-02-11 DIAGNOSIS — E119 Type 2 diabetes mellitus without complications: Secondary | ICD-10-CM | POA: Diagnosis present

## 2011-02-11 DIAGNOSIS — I69959 Hemiplegia and hemiparesis following unspecified cerebrovascular disease affecting unspecified side: Secondary | ICD-10-CM

## 2011-02-11 DIAGNOSIS — I10 Essential (primary) hypertension: Principal | ICD-10-CM | POA: Diagnosis present

## 2011-02-11 DIAGNOSIS — I69320 Aphasia following cerebral infarction: Secondary | ICD-10-CM

## 2011-02-11 DIAGNOSIS — E785 Hyperlipidemia, unspecified: Secondary | ICD-10-CM | POA: Diagnosis present

## 2011-02-11 DIAGNOSIS — Z9119 Patient's noncompliance with other medical treatment and regimen: Secondary | ICD-10-CM

## 2011-02-11 DIAGNOSIS — I2510 Atherosclerotic heart disease of native coronary artery without angina pectoris: Secondary | ICD-10-CM | POA: Diagnosis present

## 2011-02-11 DIAGNOSIS — I5042 Chronic combined systolic (congestive) and diastolic (congestive) heart failure: Secondary | ICD-10-CM

## 2011-02-11 DIAGNOSIS — I428 Other cardiomyopathies: Secondary | ICD-10-CM | POA: Diagnosis present

## 2011-02-11 DIAGNOSIS — F1410 Cocaine abuse, uncomplicated: Secondary | ICD-10-CM | POA: Diagnosis present

## 2011-02-11 DIAGNOSIS — Z91199 Patient's noncompliance with other medical treatment and regimen due to unspecified reason: Secondary | ICD-10-CM

## 2011-02-11 HISTORY — DX: Essential (primary) hypertension: I10

## 2011-02-11 HISTORY — DX: Cerebral infarction, unspecified: I63.9

## 2011-02-11 HISTORY — DX: Patient's noncompliance with other medical treatment and regimen: Z91.19

## 2011-02-11 HISTORY — DX: Patient's noncompliance with other medical treatment and regimen due to unspecified reason: Z91.199

## 2011-02-11 HISTORY — DX: Other psychoactive substance abuse, uncomplicated: F19.10

## 2011-02-11 LAB — CBC
HCT: 39 % (ref 39.0–52.0)
Hemoglobin: 13 g/dL (ref 13.0–17.0)
MCH: 26.7 pg (ref 26.0–34.0)
MCHC: 33.3 g/dL (ref 30.0–36.0)
MCV: 80.1 fL (ref 78.0–100.0)
Platelets: 313 10*3/uL (ref 150–400)
RBC: 5.35 MIL/uL (ref 4.22–5.81)
RDW: 15.1 % (ref 11.5–15.5)
WBC: 4.6 10*3/uL (ref 4.0–10.5)

## 2011-02-11 LAB — BASIC METABOLIC PANEL
BUN: 14 mg/dL (ref 6–23)
CO2: 27 mEq/L (ref 19–32)
Chloride: 102 mEq/L (ref 96–112)
Creatinine, Ser: 1.17 mg/dL (ref 0.50–1.35)
Glucose, Bld: 92 mg/dL (ref 70–99)
Potassium: 3.7 mEq/L (ref 3.5–5.1)

## 2011-02-11 LAB — ETHANOL: Alcohol, Ethyl (B): <11 mg/dL (ref 0–11)

## 2011-02-11 LAB — PROTIME-INR
INR: 0.96 (ref 0.00–1.49)
Prothrombin Time: 13 seconds (ref 11.6–15.2)

## 2011-02-11 LAB — DIFFERENTIAL
Basophils Relative: 1 % (ref 0–1)
Eosinophils Absolute: 0.3 10*3/uL (ref 0.0–0.7)
Eosinophils Relative: 5 % (ref 0–5)
Monocytes Absolute: 0.5 10*3/uL (ref 0.1–1.0)
Monocytes Relative: 8 % (ref 3–12)
Neutro Abs: 3.3 10*3/uL (ref 1.7–7.7)

## 2011-02-11 LAB — CREATININE, SERUM
Creatinine, Ser: 0.94 mg/dL (ref 0.50–1.35)
GFR calc Af Amer: >90 mL/min (ref >90)

## 2011-02-11 LAB — RAPID URINE DRUG SCREEN, HOSP PERFORMED: Opiates: POSITIVE — AB

## 2011-02-11 MED ORDER — NICARDIPINE HCL IN NACL 20-0.86 MG/200ML-% IV SOLN
5.0000 mg/h | INTRAVENOUS | Status: DC
  Administered 2011-02-11: 5 mg/h via INTRAVENOUS
  Administered 2011-02-12: 2.5 mg/h via INTRAVENOUS
  Administered 2011-02-12 (×2): 5 mg/h via INTRAVENOUS
  Administered 2011-02-12: 2.5 mg/h via INTRAVENOUS
  Administered 2011-02-13: 5 mg/h via INTRAVENOUS
  Filled 2011-02-11 (×6): qty 200

## 2011-02-11 MED ORDER — THIAMINE HCL 100 MG/ML IJ SOLN
INTRAVENOUS | Status: DC
  Administered 2011-02-11 – 2011-02-13 (×2): via INTRAVENOUS
  Filled 2011-02-11 (×8): qty 1000

## 2011-02-11 MED ORDER — NICARDIPINE HCL IN NACL 20-0.86 MG/200ML-% IV SOLN
5.0000 mg/h | Freq: Once | INTRAVENOUS | Status: AC
  Filled 2011-02-11: qty 200

## 2011-02-11 MED ORDER — SODIUM CHLORIDE 0.9 % IV SOLN: 250.0000 mL | INTRAVENOUS | Status: DC | PRN

## 2011-02-11 MED ORDER — HEPARIN SODIUM (PORCINE) 5000 UNIT/ML IJ SOLN
5000.0000 [IU] | Freq: Three times a day (TID) | INTRAMUSCULAR | Status: DC
  Administered 2011-02-12: 5000 [IU] via SUBCUTANEOUS
  Filled 2011-02-11 (×4): qty 1

## 2011-02-11 MED ORDER — ASPIRIN 325 MG PO TABS
325.0000 mg | ORAL_TABLET | Freq: Every day | ORAL | Status: DC
  Administered 2011-02-11 – 2011-02-14 (×4): 325 mg via ORAL
  Filled 2011-02-11 (×5): qty 1

## 2011-02-11 MED ORDER — ENOXAPARIN SODIUM 40 MG/0.4ML ~~LOC~~ SOLN
40.0000 mg | SUBCUTANEOUS | Status: DC
  Administered 2011-02-11 – 2011-02-14 (×4): 40 mg via SUBCUTANEOUS
  Filled 2011-02-11 (×5): qty 0.4

## 2011-02-11 MED ORDER — ASPIRIN 300 MG RE SUPP: 300.0000 mg | RECTAL | Status: DC

## 2011-02-11 MED ORDER — ASPIRIN 300 MG RE SUPP
300.0000 mg | Freq: Every day | RECTAL | Status: DC
  Filled 2011-02-11 (×2): qty 1

## 2011-02-11 MED ORDER — NICARDIPINE HCL IN NACL 20-0.86 MG/200ML-% IV SOLN
5.0000 mg/h | Freq: Once | INTRAVENOUS | Status: AC
  Administered 2011-02-11 (×2): 5 mg/h via INTRAVENOUS
  Filled 2011-02-11: qty 200

## 2011-02-11 MED ORDER — SENNOSIDES-DOCUSATE SODIUM 8.6-50 MG PO TABS
1.0000 | ORAL_TABLET | Freq: Every evening | ORAL | Status: DC | PRN
  Administered 2011-02-11: 1 via ORAL
  Filled 2011-02-11: qty 1

## 2011-02-11 MED ORDER — ASPIRIN 81 MG PO CHEW: 324.0000 mg | CHEWABLE_TABLET | ORAL | Status: DC

## 2011-02-11 MED ORDER — INSULIN ASPART 100 UNIT/ML ~~LOC~~ SOLN
0.0000 [IU] | Freq: Three times a day (TID) | SUBCUTANEOUS | Status: DC
  Filled 2011-02-11: qty 3

## 2011-02-11 NOTE — ED Provider Notes (Signed)
History     CSN: [id number]  Arrival date & time 02/11/11  4098   First MD Initiated Contact with Patient 02/11/11 1731      Chief Complaint  Patient presents with  . Altered Mental Status    (Consider location/radiation/quality/duration/timing/severity/associated sxs/prior treatment) HPI To ed for eval of confusion and ams per [facility]. Pt was there, per ems, for appt today and sent here for further eval. Pt has know right side cva with unknown deficits  Past Medical History  Diagnosis Date  . Coronary artery disease   . Stroke     History reviewed. No pertinent past surgical history.  History reviewed. No pertinent family history.  History  Substance Use Topics  . Smoking status: Not on file  . Smokeless tobacco: Not on file  . Alcohol Use:       Review of Systems  Unable to perform ROS: Unstable vital signs    Allergies  Review of patient's allergies indicates not on file.  Home Medications   Current Outpatient Rx  Name Route Sig Dispense Refill  . OXYCODONE-ACETAMINOPHEN 5-325 MG PO TABS Oral Take 1 tablet by mouth every 4 (four) hours as needed.      Marland Kitchen SPIRONOLACTONE 25 MG PO TABS Oral Take 25 mg by mouth daily.        BP 178/89  Pulse 91  Temp(Src) 97.9 F (36.6 C) (Oral)  Resp 16  SpO2 99%  Physical Exam  Nursing note and vitals reviewed. Constitutional: He appears well-developed and well-nourished. No distress.  HENT:  Head: Normocephalic and atraumatic.  Eyes: Pupils are equal, round, and reactive to light.  Neck: Normal range of motion.  Cardiovascular: Normal rate and intact distal pulses.          Date: 02/11/2011  Rate: 87  Rhythm: normal sinus rhythm  QRS Axis: normal  Intervals: normal  ST/T Wave abnormalities: nonspecific T wave changes  Conduction Disutrbances:none:   Old EKG Reviewed: unchanged        Pulmonary/Chest: No respiratory distress.  Abdominal: Normal appearance. He exhibits no distension.    Musculoskeletal: Normal range of motion.  Neurological: He is alert. GCS eye subscore is 4. GCS verbal subscore is 3. GCS motor subscore is 6.       The patient's speech pattern is in comprehensible.  He also has some right-sided weakness which appears to be old from his previous stroke.  Time onset of these findings is unknown  Skin: Skin is warm and dry. No rash noted.  Psychiatric: He has a normal mood and affect. His behavior is normal.    ED Course  Procedures (including critical care time) Patient started on nicardipine drip for blood pressure control Labs Reviewed  BASIC METABOLIC PANEL - Abnormal; Notable for the following:    GFR calc non Af Amer 68 (*)    GFR calc Af Amer 79 (*)    All other components within normal limits  CBC  DIFFERENTIAL  URINE RAPID DRUG SCREEN (HOSP PERFORMED)  PROTIME-INR  ETHANOL   Ct Head Wo Contrast  02/11/2011  *RADIOLOGY REPORT*  Clinical Data: Confusion with altered mental status and memory loss.  CT HEAD WITHOUT CONTRAST  Technique:  Contiguous axial images were obtained from the base of the skull through the vertex without contrast.  Comparison: 01/28/2010 and 01/28/2010 head CTs.  Findings: There is a large area of encephalomalacia within the left temporal and frontal lobes related to the previously demonstrated infarct.  This demonstrates expected evolution  without definite extension.  There is no evidence of acute intracranial hemorrhage, mass lesion, brain edema or extra-axial fluid collection.  There is mild ventriculomegaly.  No midline shift is demonstrated.  The visualized paranasal sinuses remain clear.  The calvarium is intact.  IMPRESSION:  1.  No acute intracranial findings identified. 2.  Left temporal frontal encephalomalacia related to previously demonstrated infarct.  Original Report Authenticated By: Gerrianne Scale, M.D.     1. Hypertensive emergency       MDM   CRITICAL CARE Performed by: Nelia Shi   Total  critical care time: 45 min  Critical care time was exclusive of separately billable procedures and treating other patients.  Critical care was necessary to treat or prevent imminent or life-threatening deterioration.  Critical care was time spent personally by me on the following activities: development of treatment plan with patient and/or surrogate as well as nursing, discussions with consultants, evaluation of patient's response to treatment, examination of patient, obtaining history from patient or surrogate, ordering and performing treatments and interventions, ordering and review of laboratory studies, ordering and review of radiographic studies, pulse oximetry and re-evaluation of patient's condition.       Nelia Shi, MD 02/14/11 1329

## 2011-02-11 NOTE — H&P (Signed)
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  Apparently he went to be seen at an outside clinic today for these symptoms and was sent here by EMS for further evaluation.  The onset of symptoms is unknonwn.  Upon arrival he was in good spirits but had difficulty communicating.  His BP was 212/122.  A stat head CT showed no new changes and he was started on a nicardipine gtt.  Lines / Drains:   Cultures / Sepsis markers:   Antibiotics:   Tests / Events: 02/11/11 CT Head: NAICP, left temporal frontal encephalomalacia from prior infarct     Past Medical History  Diagnosis Date  . Coronary artery disease   . Stroke    History reviewed. No pertinent past surgical history. Prior to Admission medications   Medication Sig Start Date End Date Taking? Authorizing Provider  oxyCODONE-acetaminophen (PERCOCET) 5-325 MG per tablet Take 1 tablet by mouth every 4 (four) hours as needed.     Yes Historical Provider, MD  spironolactone (ALDACTONE) 25 MG tablet Take 25 mg by mouth daily.     Yes Historical Provider, MD   Not on File History reviewed. No pertinent family history. Social History  does not have a smoking history on file. He does not have any smokeless tobacco history on file. His alcohol and drug histories not on file.  Review Of Systems   Cannot obtain due to aphasia  Vital Signs:   Filed Vitals:   02/11/11 1900 02/11/11 1915 02/11/11 1930 02/11/11 1945  BP: 170/94 182/89 195/93 178/89  Pulse: 84 94  91  Temp:      TempSrc:      Resp: 30 15 17 16   SpO2: 100% 100%  99%    Physical Examination: Gen: good spirits, no acute distress HEENT: NCAT, PERRL, EOMi, OP clear,  Neck: supple without masses PULM: CTA B CV: RRR, no mgr, no JVD AB: BS+, soft, nontender, no hsm Ext: warm, no edema, no  clubbing, no cyanosis Derm: no rash or skin breakdown Neuro: awake and alert, speaks in one word responses, cannot answer questions completely, the few words he does speak are clear, CN II-XII intact, 5/5 L arm and leg, 4/5 right Psyche: Normal mood and affect  Labs and Imaging:   CBC    Component Value Date/Time   WBC 5.8 02/11/2011 1734   RBC 4.87 02/11/2011 1734   HGB 13.0 02/11/2011 1734   HCT 39.0 02/11/2011 1734   PLT 336 02/11/2011 1734   MCV 80.1 02/11/2011 1734   MCH 26.7 02/11/2011 1734   MCHC 33.3 02/11/2011 1734   RDW 15.2 02/11/2011 1734   LYMPHSABS 1.6 02/11/2011 1734   MONOABS 0.5 02/11/2011 1734   EOSABS 0.3 02/11/2011 1734   BASOSABS 0.0 02/11/2011 1734    BMET    Component Value Date/Time   NA 139 02/11/2011 1734   K 3.7 02/11/2011 1734   CL 102 02/11/2011 1734   CO2 27 02/11/2011 1734   GLUCOSE 92 02/11/2011 1734   BUN 14 02/11/2011 1734   CREATININE 1.17 02/11/2011 1734   CALCIUM 9.5 02/11/2011 1734   GFRNONAA 68* 02/11/2011 1734   GFRAA 79* 02/11/2011 1734   UDS: negative for cocaine   Assessment and Plan:  56 y/o male with a history of HTN, DM, prior  L MCA stroke presented for evaluation of new onset aphasia of uncertain time of onset.  Found to be profoundly hypertensive.  Currently without clear new motor deficits.  Ddx of symptoms is hypertensive emergency vs. new ischaemic stroke.  Hypertensive emergency (02/11/2011)   Assessment: likely cause of neurologic symptoms, due to medication non-compliance   Plan:  -nicardipine gtt started in ED has BP at goal for now -maintain on nicardipine gtt overnight, then transition to oral meds in AM  Aphasia (02/11/2011)   Assessment: neurology consulting now,  Symptoms likely due to hypertensive emergency, will discuss possibility of new ischaemic stroke with them   Plan:  -per neurology -for now npo, will determine speech/pt/ot needs based on neurology recs  Diabetes mellitus (02/11/2011)   Assessment:    Plan:  -ssi and cbg  Cardiomyopathy  (02/11/2011)   Assessment: per notes due to alcohol abuse (LVEF 20-25% per 01/2010 study)   Plan:  -monitor volume status closely  Alcohol abuse   Assessment: not intoxicated or withdrawing now   Plan: -banana bag -monitor for withdrawal    Best practices / Disposition: ICU status on PCCM service, neurology consulting  Feeding/protein malnutrition: npo Analgesia: n/a Sedation: n/a Thromboprophylaxis: sub q hep HOB >30 degrees Ulcer prophylaxis: n/a Glucose control/hyperglycemia: ssi + cbg  The patient is critically ill with multiple organ systems failure and requires high complexity decision making for assessment and support, frequent evaluation and titration of therapies, application of advanced monitoring technologies and extensive interpretation of multiple databases. Critical Care Time devoted to patient care services described in this note is 45 minutes.  Heber Elmo, M.D. Pulmonary and Critical Care Medicine [facility] Pager: [phone]  02/11/2011, 8:20 PM

## 2011-02-11 NOTE — ED Notes (Signed)
Pateint is CAO to baseline.  Patient with previous stroke, understands what is being said to him, has some residual on the right side of body with some mild aphasia.  Does speak with yes and no answers.  Report received from [place], [place].

## 2011-02-11 NOTE — Consult Note (Signed)
STROKE CONSULT.  Reason for Consult: patient presented with aphasia and right arm weakness, clumsiness. Stroke patient, who was 12 month ago  admitted with  Left MCA stroke . New stroke or expansion of stroke ?  Referring Physician: ED, Dr .Radford Pax.   CC:  Patient is non fluent and points top his right arm, stuttering, repeatedly stating" It's not , not right'  HPI: Jeremy Howell is a afro-american patient with a history of stroke in i12-11, a  56 y.o. male , who resides now in a residential home. He presented with extreme hypertension. CT not indicative of new stroke, nor bleed. Patient is alert and cooperative.   Past Medical History  Diagnosis Date  . Coronary artery disease   . Stroke, left MCA Polysubstance abuse - cocaine, cannabis, ETOH and heroin  Hypertension, malignant       History reviewed. Last admission in 01-21-10 to Dr Concepcion Elk for malignant hypertension and left MCA stroke, no surgeries listed.  History reviewed. No family history is available, no member of the family here.   Social History:  does not have a smoking or history of smokeless tobacco history on file. His alcohol and drug histories is reported in the last admission .   Medications:  Incomplete information .  ROS:  patient is non-fluent and indicated by gestures that his right arm is weaker, denies pain. BP initially 212 mmHg  Systolic  ,. Lower now on IV cardene.   Blood pressure 178/89, pulse 91, temperature 97.9 F (36.6 C), temperature source Oral, resp. rate 16, SpO2 99.00%. Patient is well nourished , but has poor dentition, large gaps,  There is mild right facial drrop , no drooling.  can move neck and head , full ROM.  has LCTA and no edema, all peripheral pulses were present,. No rash.    Neurologic Examination: Mental Status: Alert, oriented, - Speech non  fluent -evidence of expressive  Aphasia, but follows verbal commands indicating  That no sensory a[phasia is present.  Head nodding and  shaking to questions seems appropriate . Able to follow 1-2  step commands without difficulty. Cranial Nerves: Discs flat bilaterally. Visual fields limited on the right , but has full Extraocular movements, intact pupillary  reaction bilaterally.  Smile is not symmetric. Right lower facial droop, not involving the forehead.   delayed gag, uvula deviated .  Right  shoulder shrug weaker , arm extension and grip weaker. Unable to extend at the elbow , keeps  arm flexed. Wrist drop. Weakness of finger extension.    tongue extension Motor:  Right upper extremity weakness with spasticity , likely  Long standing. Sensory: Pinprick and light touch intact throughout the left , seems to be extinct on the right . Deep Tendon Reflexes: 2+ right, 1 plus left , upgoing toe on the right .  Deferred gait and station.   Results for orders placed during the hospital encounter of 02/11/11 (from the past 48 hour(s))  BASIC METABOLIC PANEL     Status: Abnormal   Collection Time   02/11/11  5:34 PM      Component Value Range Comment   Sodium 139  135 - 145 (mEq/L)    Potassium 3.7  3.5 - 5.1 (mEq/L)    Chloride 102  96 - 112 (mEq/L)    CO2 27  19 - 32 (mEq/L)    Glucose, Bld 92  70 - 99 (mg/dL)    BUN 14  6 - 23 (mg/dL)    Creatinine, Ser  1.17  0.50 - 1.35 (mg/dL)    Calcium 9.5  8.4 - 10.5 (mg/dL)    GFR calc non Af Amer 68 (*) >90 (mL/min)    GFR calc Af Amer 79 (*) >90 (mL/min)   CBC     Status: Normal   Collection Time   02/11/11  5:34 PM      Component Value Range Comment   WBC 5.8  4.0 - 10.5 (K/uL)    RBC 4.87  4.22 - 5.81 (MIL/uL)    Hemoglobin 13.0  13.0 - 17.0 (g/dL)    HCT 16.1  09.6 - 04.5 (%)    MCV 80.1  78.0 - 100.0 (fL)    MCH 26.7  26.0 - 34.0 (pg)    MCHC 33.3  30.0 - 36.0 (g/dL)    RDW 40.9  81.1 - 91.4 (%)    Platelets 336  150 - 400 (K/uL)   DIFFERENTIAL     Status: Normal   Collection Time   02/11/11  5:34 PM      Component Value Range Comment   Neutrophils Relative 58  43 -  77 (%)    Neutro Abs 3.3  1.7 - 7.7 (K/uL)    Lymphocytes Relative 28  12 - 46 (%)    Lymphs Abs 1.6  0.7 - 4.0 (K/uL)    Monocytes Relative 8  3 - 12 (%)    Monocytes Absolute 0.5  0.1 - 1.0 (K/uL)    Eosinophils Relative 5  0 - 5 (%)    Eosinophils Absolute 0.3  0.0 - 0.7 (K/uL)    Basophils Relative 1  0 - 1 (%)    Basophils Absolute 0.0  0.0 - 0.1 (K/uL)   URINE RAPID DRUG SCREEN (HOSP PERFORMED)     Status: Abnormal   Collection Time   02/11/11  7:26 PM      Component Value Range Comment   Opiates POSITIVE (*) NONE DETECTED     Cocaine NONE DETECTED  NONE DETECTED     Benzodiazepines NONE DETECTED  NONE DETECTED     Amphetamines NONE DETECTED  NONE DETECTED     Tetrahydrocannabinol NONE DETECTED  NONE DETECTED     Barbiturates NONE DETECTED  NONE DETECTED      Ct Head Wo Contrast  02/11/2011  *RADIOLOGY REPORT*  Clinical Data: Confusion with altered mental status and memory loss.  CT HEAD WITHOUT CONTRAST  Technique:  Contiguous axial images were obtained from the base of the skull through the vertex without contrast.  Comparison: 01/28/2010 and 01/28/2010 head CTs.  Findings: There is a large area of encephalomalacia within the left temporal and frontal lobes related to the previously demonstrated infarct.  This demonstrates expected evolution without definite extension.  There is no evidence of acute intracranial hemorrhage, mass lesion, brain edema or extra-axial fluid collection.  There is mild ventriculomegaly.  No midline shift is demonstrated.  The visualized paranasal sinuses remain clear.  The calvarium is intact.  IMPRESSION:  1.  No acute intracranial findings identified. 2.  Left temporal frontal encephalomalacia related to previously demonstrated infarct.  Original Report Authenticated By: Gerrianne Scale, M.D.     Assessment/Plan: [facility].   Patient Active Hospital Problem List: Hypertensive emergency (02/11/2011)   Assessment:  keep on iv medication for BP control,  Goal is BP of 160 systolic or less, with  Transfer to oral medications. hypertensive crisis is  likely the cause for the exacerbation of his old  stroke symptoms.  Diabetes mellitus (02/11/2011)   Assessment: internal medicine - stroke risk factor.   Plan: carb modified diet . Cardiomyopathy (02/11/2011)   Assessment: order echocardiogram, consider TEE only  if MRI confirms a new stroke.    Aphasia (02/11/2011)   Assessment:  Preexisting , patient was non-fluent  at his last admission in Dec 2011 to Dr Concepcion Elk .    Plan: MRI needed to confirm no new stroke occurred  - otherwise ST assessment for home ST.     Amahia Madonia ,M.D.  [facility] for STROKE MD  02/11/2011, 8:37 PM

## 2011-02-11 NOTE — ED Notes (Signed)
To ed for eval of confusion and ams per [facility]. Pt was there, per ems, for appt today and sent here for further eval. Pt has know right side cva with unknown deficits.

## 2011-02-12 ENCOUNTER — Inpatient Hospital Stay (HOSPITAL_COMMUNITY): Payer: Medicare Other

## 2011-02-12 ENCOUNTER — Encounter (HOSPITAL_COMMUNITY): Payer: Self-pay

## 2011-02-12 DIAGNOSIS — A65 Nonvenereal syphilis: Secondary | ICD-10-CM

## 2011-02-12 LAB — GLUCOSE, CAPILLARY
Glucose-Capillary: 105 mg/dL — ABNORMAL HIGH (ref 70–99)
Glucose-Capillary: 96 mg/dL (ref 70–99)
Glucose-Capillary: 89 mg/dL (ref 70–99)

## 2011-02-12 LAB — BASIC METABOLIC PANEL
Calcium: 9.6 mg/dL (ref 8.4–10.5)
GFR calc Af Amer: >90 mL/min (ref >90)
GFR calc non Af Amer: >90 mL/min (ref >90)
Glucose, Bld: 84 mg/dL (ref 70–99)
Potassium: 3.4 mEq/L — ABNORMAL LOW (ref 3.5–5.1)
Sodium: 140 mEq/L (ref 135–145)

## 2011-02-12 LAB — CBC
Hemoglobin: 14.8 g/dL (ref 13.0–17.0)
MCH: 26.2 pg (ref 26.0–34.0)
MCHC: 32.9 g/dL (ref 30.0–36.0)
Platelets: 351 10*3/uL (ref 150–400)

## 2011-02-12 LAB — LIPID PANEL
LDL Cholesterol: 163 mg/dL — ABNORMAL HIGH (ref 0–99)
VLDL: 19 mg/dL (ref 0–40)

## 2011-02-12 LAB — CARDIAC PANEL(CRET KIN+CKTOT+MB+TROPI)
CK, MB: 3.2 ng/mL (ref 0.3–4.0)
CK, MB: 2.7 ng/mL (ref 0.3–4.0)
Relative Index: 2.1 (ref 0.0–2.5)
Relative Index: 2.9 — ABNORMAL HIGH (ref 0.0–2.5)
Total CK: 117 U/L (ref 7–232)
Troponin I: <0.3 ng/mL (ref <0.30)
Troponin I: <0.3 ng/mL (ref <0.30)
Troponin I: <0.3 ng/mL (ref <0.30)

## 2011-02-12 LAB — HEMOGLOBIN A1C: Hgb A1c MFr Bld: 5.9 % — ABNORMAL HIGH (ref <5.7)

## 2011-02-12 LAB — MRSA PCR SCREENING: MRSA by PCR: NEGATIVE

## 2011-02-12 MED ORDER — HYDROCHLOROTHIAZIDE 25 MG PO TABS
25.0000 mg | ORAL_TABLET | Freq: Every day | ORAL | Status: DC
  Administered 2011-02-12 (×2): 25 mg via ORAL
  Filled 2011-02-12 (×2): qty 1

## 2011-02-12 MED ORDER — HYDROCHLOROTHIAZIDE 25 MG PO TABS
25.0000 mg | ORAL_TABLET | Freq: Every day | ORAL | Status: DC
  Administered 2011-02-13: 25 mg via ORAL
  Filled 2011-02-12 (×2): qty 1

## 2011-02-12 MED ORDER — OLMESARTAN MEDOXOMIL 40 MG PO TABS
40.0000 mg | ORAL_TABLET | Freq: Every day | ORAL | Status: DC
  Administered 2011-02-12 – 2011-02-14 (×3): 40 mg via ORAL
  Filled 2011-02-12 (×4): qty 1

## 2011-02-12 MED ORDER — FOLIC ACID 1 MG PO TABS
1.0000 mg | ORAL_TABLET | Freq: Every day | ORAL | Status: DC
  Administered 2011-02-12 – 2011-02-14 (×3): 1 mg via ORAL
  Filled 2011-02-12 (×4): qty 1

## 2011-02-12 MED ORDER — OLMESARTAN-AMLODIPINE-HCTZ 40-10-25 MG PO TABS: 1.0000 | ORAL_TABLET | Freq: Every day | ORAL | Status: DC

## 2011-02-12 MED ORDER — CARVEDILOL 12.5 MG PO TABS
12.5000 mg | ORAL_TABLET | Freq: Two times a day (BID) | ORAL | Status: DC
  Administered 2011-02-12 – 2011-02-15 (×6): 12.5 mg via ORAL
  Filled 2011-02-12 (×9): qty 1

## 2011-02-12 MED ORDER — AMLODIPINE BESYLATE 10 MG PO TABS
10.0000 mg | ORAL_TABLET | Freq: Every day | ORAL | Status: DC
  Administered 2011-02-12 – 2011-02-14 (×3): 10 mg via ORAL
  Filled 2011-02-12 (×5): qty 1

## 2011-02-12 MED ORDER — FUROSEMIDE 20 MG PO TABS
20.0000 mg | ORAL_TABLET | Freq: Every day | ORAL | Status: DC
  Administered 2011-02-12 – 2011-02-14 (×3): 20 mg via ORAL
  Filled 2011-02-12 (×4): qty 1

## 2011-02-12 NOTE — Progress Notes (Signed)
  Echocardiogram 2D Echocardiogram has been performed.  Jeremy Howell 02/12/2011, 10:19 AM

## 2011-02-12 NOTE — Progress Notes (Signed)
Stroke Team Progress Note  SUBJECTIVE  Jeremy Howell is a 56 y.o. male whose reason for Consult: patient presented with aphasia and right arm weakness, clumsiness in the setting of significantly elevated blood pressure. 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 New stroke or expansion of stroke ?   His therapists are at the bedside. Overall he feels his condition is unchanged.his blood pressure has been controlled now on IV Cardizem drip. Physical therapy is currently evaluating him for swallow assessment   OBJECTIVE Most recent Vital Signs: Temp: 97.7 F (36.5 C) (01/03 0700) Temp src: Oral (01/03 0700) BP: 144/82 mmHg (01/03 0700) Pulse Rate: 76  (01/03 0700) Respiratory Rate: 15 O2 Saturation: 99%  CBG (last 3)  No results found for this basename: GLUCAP:3 in the last 72 hours Intake/Output from previous day: 01/02 0701 - 01/03 0700 In: 700 [I.V.:700] Out: 1050 [Urine:1050]  IV Fluid Intake:     . niCARDipine 5 mg/hr (02/12/11 0657)  . banana bag IV fluid 1000 mL 100 mL/hr at 02/12/11 0600   Medications   . aspirin  300 mg Rectal Daily   Or  . aspirin  325 mg Oral Daily  . enoxaparin  40 mg Subcutaneous Q24H  . heparin  5,000 Units Subcutaneous Q8H  . insulin aspart  0-15 Units Subcutaneous TID WC  . niCARDipine  5 mg/hr Intravenous Once  . niCARDipine  5 mg/hr Intravenous Once  . DISCONTD: aspirin  324 mg Oral NOW  . DISCONTD: aspirin  300 mg Rectal NOW   Diet:  NPO  Activity:  Up with assistance to BR DVT Prophylaxis:  Heparin 5000 units sq tid and Lovenox 40 mg sq daily   Studies: CBC    Component Value Date/Time   WBC 4.1 02/12/2011 0420   RBC 5.65 02/12/2011 0420   HGB 14.8 02/12/2011 0420   HCT 45.0 02/12/2011 0420   PLT 351 02/12/2011 0420   MCV 79.6 02/12/2011 0420   MCH 26.2 02/12/2011 0420   MCHC 32.9 02/12/2011 0420   RDW 15.2 02/12/2011 0420   LYMPHSABS 1.6 02/11/2011 1734   MONOABS 0.5 02/11/2011 1734   EOSABS 0.3 02/11/2011 1734   BASOSABS 0.0 02/11/2011 1734   CMP    Component Value Date/Time   NA 140 02/12/2011 0420   K 3.4* 02/12/2011 0420   CL 101 02/12/2011 0420   CO2 27 02/12/2011 0420   GLUCOSE 84 02/12/2011 0420   BUN 9 02/12/2011 0420   CREATININE 0.87 02/12/2011 0420   CALCIUM 9.6 02/12/2011 0420   PROT 7.3 01/21/2010 2303   ALBUMIN 3.6 01/21/2010 2303   AST 17 01/21/2010 2303   ALT 11 01/21/2010 2303   ALKPHOS 79 01/21/2010 2303   BILITOT 0.4 01/21/2010 2303   GFRNONAA >90 02/12/2011 0420   GFRAA >90 02/12/2011 0420   COAGS Lab Results  Component Value Date   INR 0.96 02/11/2011   INR 1.04 01/21/2010   Lipid Panel    Component Value Date/Time   CHOL 236* 02/12/2011 0420   TRIG 97 02/12/2011 0420   HDL 54 02/12/2011 0420   CHOLHDL 4.4 02/12/2011 0420   VLDL 19 02/12/2011 0420   LDLCALC 163* 02/12/2011 0420   HgbA1C  Lab Results  Component Value Date   HGBA1C  Value: 5.8 (NOTE)  According to the ADA Clinical Practice Recommendations for 2011, when HbA1c is used as a screening test:   >=6.5%   Diagnostic of Diabetes Mellitus           (if abnormal result  is confirmed)  5.7-6.4%   Increased risk of developing Diabetes Mellitus  References:Diagnosis and Classification of Diabetes Mellitus,Diabetes Care,2011,34(Suppl 1):S62-S69 and Standards of Medical Care in         Diabetes - 2011,Diabetes Care,2011,34  (Suppl 1):S11-S61.* 01/22/2010   Urine Drug Screen     Component Value Date/Time   LABOPIA POSITIVE* 02/11/2011 1926   COCAINSCRNUR NONE DETECTED 02/11/2011 1926   LABBENZ NONE DETECTED 02/11/2011 1926   AMPHETMU NONE DETECTED 02/11/2011 1926   THCU NONE DETECTED 02/11/2011 1926   LABBARB NONE DETECTED 02/11/2011 1926    Alcohol Level    Component Value Date/Time   ETH <11 02/11/2011 2014     CT of the brain   1.  No acute intracranial findings identified. 2.  Left temporal frontal encephalomalacia related to previously demonstrated infarct.   MRI of the  brain  ordered    MRA of the brain  ordered  2D Echocardiogram  ordered  Carotid Doppler  ordered    CXR   Progressive cardiomegaly.  No acute abnormality.  EKG ordered .   Physical Exam   Present middle-aged [place] American gentleman currently not in distress. Sitting up in bed. Afebrile. Neck is supple without bruit. Head is not traumatic. Cardiac exam no murmur or gallop. Lungs are clear to auscultation. Neurological Exam :  Awake alert significant expressive greater than receptive aphasia. Speaks only short sentences and a few words. Significant word hesitancy and paraphasic errors. Comprehension also significantly impaired. Unable to repeat on name.Eye movements are full range without nystagmus. Blinks to threat bilaterally.mild right lower facial weakness. Spastic right hemiparesis with grade 3/5 strength in the right upper extremity and 4/5 strength in right lower extremity. Increased tone on the right. Good strength on the left side. Coordination is impaired on the right. Sensation is preserved. Gait was not tested.  ASSESSMENT Jeremy Howell is a 56 y.o. male with a aphasia and right arm hemiparesis, secondary to new stroke versus worsening of old stroke symptoms in the setting of hypertensive emergency. On aspirin 325 mg orally every day for secondary stroke prevention.  Hypertensive urgency on Cardene drip  Hyperlipidemia  Stroke risk factors:  hyperlipidemia, hypertension and coronary artery disease and previous stroke  Hospital day # 1  TREATMENT/PLAN Continue aspirin 325 mg orally every day for secondary stroke prevention. Diet as tolerated. Out of bed. Recommend starting oral antihypertensives and weaning Cardene. Check MRI.physical, occupational and speech therapy consults. Transfer to the floor when he is off the Cardene drip  SHARON BIBY, AVNP, ANP-BC, GNP-BC [facility] Pager: [phone] 02/12/2011 8:13 AM  Dr. Delia Heady, Stroke Center  Medical Director, has personally reviewed chart, pertinent data, examined the patient and developed the plan of care.

## 2011-02-12 NOTE — Progress Notes (Signed)
Occupational Therapy Evaluation Patient Details Name: Jeremy Howell MRN: [id number] DOB: [birth year] Today's Date: 02/12/2011  Problem List:  Patient Active Problem List  Diagnoses  . Hypertensive emergency  . Diabetes mellitus  . Cardiomyopathy  . Aphasia    Past Medical History:  Past Medical History  Diagnosis Date  . Coronary artery disease   . Stroke    Past Surgical History: History reviewed. No pertinent past surgical history.  OT Assessment/Plan/Recommendation OT Assessment Clinical Impression Statement: 56 yo male s/p Lt MCA with Rt hemiplegia with worsing strength and aphagia due to hypertension. Pt could benefit from skilled OT acutely to maximize independence for d/c home. OT Recommendation/Assessment: Patient will need skilled OT in the acute care venue OT Problem List: Decreased strength;Decreased range of motion;Impaired balance (sitting and/or standing);Decreased activity tolerance;Decreased coordination;Decreased cognition;Decreased safety awareness;Decreased knowledge of precautions;Impaired UE functional use;Impaired tone OT Therapy Diagnosis : Generalized weakness;Hemiplegia dominant side OT Plan OT Frequency: Min 2X/week OT Treatment/Interventions: Self-care/ADL training;Therapeutic exercise;Neuromuscular education;DME and/or AE instruction;Therapeutic activities;Patient/family education;Balance training OT Recommendation Follow Up Recommendations: Home health OT Equipment Recommended: None recommended by PT Individuals Consulted Consulted and Agree with Results and Recommendations: Patient unable/family or caregiver not available OT Goals Acute Rehab OT Goals OT Goal Formulation: Patient unable to participate in goal setting Time For Goal Achievement: 2 weeks ADL Goals Pt Will Perform Grooming: with modified independence;Sitting at sink Pt Will Perform Upper Body Bathing: with modified independence;Sit to stand from chair;Supported Pt Will Perform Lower  Body Bathing: with set-up;Sit to stand from chair Miscellaneous OT Goals Miscellaneous OT Goal #1: Pt will perform bed mobility S level with HOB flat as precursor to ADLS  OT Evaluation Precautions/Restrictions  Precautions Precautions: Fall;Other (comment) (Left LE lift shoe.) Required Braces or Orthoses: No Other Brace/Splint: orthotic shoe Lt LE Restrictions Weight Bearing Restrictions: No Prior Functioning Home Living Lives With: Other (Comment) Romeo Apple and Steward Drone (unable to determine relationship to pt).) Receives Help From:  (Unable to determine secondary to aphasia.) Type of Home:  (Unable to determine secondary to aphasia.) Additional Comments: previous living situation unknown and pt unable to verbalize details due to dysphagic Prior Function Level of Independence:  (Unable to determine secondary to aphasia.) ADL ADL Eating/Feeding: Performed;Set up Where Assessed - Eating/Feeding: Edge of bed Grooming: Performed;Set up;Wash/dry face Grooming Details (indicate cue type and reason): pt using Lt UE  Where Assessed - Grooming: Supine, head of bed up Upper Body Dressing: Performed;Minimal assistance Where Assessed - Upper Body Dressing: Sitting, chair;Unsupported Toilet Transfer: Performed;Minimal assistance Toilet Transfer Method: Freight forwarder Equipment: Regular height toilet;Other (comment) (standing to attempt to void bladder) Toileting - Clothing Manipulation: Performed;Moderate assistance Where Assessed - Toileting Clothing Manipulation: Standing Equipment Used: Other (comment) (hand held A) ADL Comments: Pt requesting to void bladder. Pt unable to hold urinal with Rt UE. Pt agreeable to bathroom transfer. pt standing at toilet without void. Pt verbalizing and with facial expressions of frustration with inability to void. Pt with leg length discrepancy on Lt LE. Pt ambulating with limp Vision/Perception  Vision - History Baseline Vision: No visual  deficits Patient Visual Report: No change from baseline Vision - Assessment Eye Alignment: Within Functional Limits Vision Assessment: Vision tested Ocular Range of Motion: Within Functional Limits Visual Fields: No apparent deficits Additional Comments: pt required tactile cue to prevent head movement. pt following with eyes only with tactile input. Cognition Cognition Arousal/Alertness: Awake/alert Overall Cognitive Status: History of cognitive impairments History of Cognitive Impairment: Appears at baseline functioning Orientation Level: Oriented  to person;Oriented to situation Sensation/Coordination Sensation Light Touch: Not tested Stereognosis: Not tested Hot/Cold: Not tested Proprioception: Not tested Coordination Gross Motor Movements are Fluid and Coordinated: Yes Fine Motor Movements are Fluid and Coordinated: Not tested Extremity Assessment RUE Assessment RUE Assessment: Not tested RUE Tone RUE Tone: Moderate RUE Tone Comments: pt with gross grasp with digits in extension, wrist in neutral with deficits present. Pt PROM into wrist flexion  however PROm unable to achieve wrist extension. PROM digit flexion WFL, Pt with AROM 90 degrees shoulder flexion, Abduction AAROM 90 degrees LUE Assessment LUE Assessment: Not tested Mobility  Bed Mobility Bed Mobility: Yes Supine to Sit: 5: Supervision;HOB flat Supine to Sit Details (indicate cue type and reason): Verbal cues for sequence. Sitting - Scoot to Edge of Bed: 4: Min assist Sitting - Scoot to [place] of Bed Details (indicate cue type and reason): Assist to weight shift and reciprocally scoot to EOB. Transfers Transfers: Yes Sit to Stand: 4: Min assist;From bed;From chair/3-in-1 (2 trials.) Sit to Stand Details (indicate cue type and reason): Assist to shift weight anterior over BOS with guarding for balance.  Cues for hand placement. Stand to Sit: 4: Min assist;To chair/3-in-1 (2 trials.) Stand to Sit Details: Assist  to slow descent to chair with cues for sequence. Exercises   End of Session OT - End of Session Equipment Utilized During Treatment: Gait belt Activity Tolerance: Patient tolerated treatment well Patient left: in chair;with call bell in reach (pt able to demonstrate proper use of call bell) Nurse Communication: Mobility status for transfers;Mobility status for ambulation General Behavior During Session: [facility] for tasks performed Cognition: Impaired, at baseline   Lucile Shutters 02/12/2011, 9:19 AM  Pager: [phone]

## 2011-02-12 NOTE — Progress Notes (Signed)
Speech Language Pathology Clinical/Bedside Swallow Evaluation Patient Details  Name: Jeremy Howell MRN: [id number] DOB: 25-Mar-1955 Today's Date: 02/12/2011  Past Medical History:  Past Medical History  Diagnosis Date  . Coronary artery disease   . Stroke     HPI:  56 y/o male with DM, HTN and a history of an old L MCA stroke in 01/2010 presented today for aphasia.  Apparently he went to be seen at an outside clinic today for these symptoms and was sent to [facility] for further evaluation.      Assessment/Recommendations/Treatment Plan  SLP Assessment Clinical Impression Statement: Demonstrate a functional oral-pharyngeal swallow without any overt clinical indicators of a dysphagia and/or aspiration. Risk for Aspiration: None Other Related Risk Factors: Previous CVA  Recommendations 1.  Regular & Thin liquids 2.  Whole meds with liquid Supervision: Patient able to self feed;Intermittent supervision to cue for compensatory strategies Compensations: Slow rate;Small sips/bites Postural Changes and/or Swallow Maneuvers: Seated upright 90 degrees;Upright 30-60 min after meal Oral Care Recommendations: Oral care BID;Staff/trained caregiver to provide oral care Follow up Recommendations: None  Individuals Consulted Consulted and Agree with Results and Recommendations: Patient;MD;RN  Swallow Evaluation Prior Functional Status   General  Date of Onset: 02/11/11 Type of Study: Bedside swallow evaluation Diet Prior to this Study: NPO;IV Temperature Spikes Noted: No Respiratory Status: Room air History of Intubation: No Behavior/Cognition: Alert;Cooperative;Other (comment) (Baseline aphasia) Oral Cavity - Dentition: Poor condition;Missing dentition Vision: Functional for self-feeding Patient Positioning: Upright in bed Baseline Vocal Quality: Normal Volitional Cough: Strong Volitional Swallow: Unable to elicit (likely due to apraxia) Ice chips: Not tested  Oral Motor/Sensory  Function  Overall Oral Motor/Sensory Function: Appears within functional limits for tasks assessed  Consistency Results  Ice Chips Ice chips: Not tested  Thin Liquid Thin Liquid: Within functional limits Presentation: Cup;Self Fed;Straw  Nectar Thick Liquid Nectar Thick Liquid: Not tested  Honey Thick Liquid Honey Thick Liquid: Not tested  Puree Puree: Within functional limits Presentation: Self Fed;Spoon  Solid Solid: Within functional limits Presentation: Self Dewaine Oats, M.S.,CCC-SLP Pager [phone] 02/12/2011,8:54 AM

## 2011-02-12 NOTE — Progress Notes (Signed)
Physical Therapy Evaluation Patient Details Name: Jeremy Howell MRN: [id number] DOB: 22-Mar-1955 Today's Date: 02/12/2011  Problem List:  Patient Active Problem List  Diagnoses  . Hypertensive emergency  . Diabetes mellitus  . Cardiomyopathy  . Aphasia    Past Medical History:  Past Medical History  Diagnosis Date  . Coronary artery disease   . Stroke    Past Surgical History: History reviewed. No pertinent past surgical history.  PT Assessment/Plan/Recommendation PT Assessment Clinical Impression Statement: Pt is a 56 y/o male admitted with exaccerbation of premorbid aphasia and right sided strength deficits in the setting of hypertension.  Pt also has the below PT problem list and would benefit from acute PT to maximize independence prior to d/c. PT Recommendation/Assessment: Patient will need skilled PT in the acute care venue PT Problem List: Decreased activity tolerance;Decreased balance;Decreased mobility;Decreased cognition Barriers to Discharge: Other (comment) (Unsure of d/c environment and support.) PT Therapy Diagnosis : Hemiplegia non-dominant side PT Plan PT Frequency: Min 3X/week PT Treatment/Interventions: Gait training;Functional mobility training;Therapeutic activities;Balance training;Cognitive remediation;Neuromuscular re-education;Patient/family education PT Recommendation Follow Up Recommendations: Home health PT;24 hour supervision/assistance Equipment Recommended: None recommended by PT PT Goals  Acute Rehab PT Goals PT Goal Formulation: With patient Time For Goal Achievement: 7 days Pt will go Supine/Side to Sit: with modified independence PT Goal: Supine/Side to Sit - Progress: Not met Pt will go Sit to Supine/Side: with modified independence PT Goal: Sit to Supine/Side - Progress: Not met Pt will go Sit to Stand: with modified independence PT Goal: Sit to Stand - Progress: Not met Pt will go Stand to Sit: with modified independence PT Goal: Stand  to Sit - Progress: Not met Pt will Ambulate: >150 feet;with modified independence;with least restrictive assistive device PT Goal: Ambulate - Progress: Not met  PT Evaluation Precautions/Restrictions  Precautions Precautions: Fall;Other (comment) (Left LE lift shoe.) Required Braces or Orthoses: No Other Brace/Splint: orthotic shoe Lt LE Restrictions Weight Bearing Restrictions: No Prior Functioning  Home Living Lives With: Other (Comment) Romeo Apple and Steward Drone (unable to determine relationship to pt).) Receives Help From:  (Unable to determine secondary to aphasia.) Type of Home:  (Unable to determine secondary to aphasia.) Additional Comments: previous living situation unknown and pt unable to verbalize details due to dysphagic Prior Function Level of Independence:  (Unable to determine secondary to aphasia.) Cognition Cognition Arousal/Alertness: Awake/alert Overall Cognitive Status: History of cognitive impairments History of Cognitive Impairment: Appears at baseline functioning Orientation Level: Oriented to person;Oriented to situation Sensation/Coordination Sensation Light Touch: Not tested Stereognosis: Not tested Hot/Cold: Not tested Proprioception: Not tested Coordination Gross Motor Movements are Fluid and Coordinated: Yes Fine Motor Movements are Fluid and Coordinated: Not tested Extremity Assessment RUE Assessment RUE Assessment: Not tested LUE Assessment LUE Assessment: Not tested RLE Assessment RLE Assessment: Within Functional Limits LLE Assessment LLE Assessment: Within Functional Limits Pain 0/10 with evaluation. Mobility (including Balance) Bed Mobility Bed Mobility: Yes Supine to Sit: 5: Supervision;HOB flat Supine to Sit Details (indicate cue type and reason): Verbal cues for sequence. Sitting - Scoot to Edge of Bed: 4: Min assist Sitting - Scoot to [place] of Bed Details (indicate cue type and reason): Assist to weight shift and reciprocally scoot to  EOB. Transfers Transfers: Yes Sit to Stand: 4: Min assist;From bed;From chair/3-in-1 (2 trials.) Sit to Stand Details (indicate cue type and reason): Assist to shift weight anterior over BOS with guarding for balance.  Cues for hand placement. Stand to Sit: 4: Min assist;To chair/3-in-1 (2 trials.) Stand to  Sit Details: Assist to slow descent to chair with cues for sequence. Ambulation/Gait Ambulation/Gait: Yes Ambulation/Gait Assistance: 4: Min assist Ambulation/Gait Assistance Details (indicate cue type and reason): Assist for balance due to leg length discrepancy prior to knowledge of lift shoe.  Cues for posture and safety. Ambulation Distance (Feet): 22 Feet (2 feet and then 20 feet.) Assistive device: None Gait Pattern: Decreased step length - left;Decreased stance time - right;Right flexed knee in stance;Trunk flexed Stairs: No Wheelchair Mobility Wheelchair Mobility: No  Posture/Postural Control Posture/Postural Control: No significant limitations Balance Balance Assessed: Yes Static Sitting Balance Static Sitting - Balance Support: No upper extremity supported;Feet supported Static Sitting - Level of Assistance: 7: Independent Static Sitting - Comment/# of Minutes: 10 End of Session PT - End of Session Equipment Utilized During Treatment: Gait belt Activity Tolerance: Patient tolerated treatment well Patient left: in chair;with call bell in reach Nurse Communication: Mobility status for transfers;Mobility status for ambulation General Behavior During Session: [facility] for tasks performed Cognition: Impaired, at baseline Co-evaluation with OT.  Markelle Najarian M 02/12/2011, 9:12 AM  02/12/2011 Cephus Shelling, PT, DPT [phone]

## 2011-02-12 NOTE — H&P (Signed)
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  Apparently he went to be seen at an outside clinic today for these symptoms and was sent here by EMS for further evaluation.  The onset of symptoms is unknonwn.  Upon arrival he was in good spirits but had difficulty communicating.  His BP was 212/122.  A stat head CT showed no new changes and he was started on a nicardipine gtt.  Lines / Drains:   Cultures / Sepsis markers: Anti-infectives    None       Antibiotics: Results for orders placed during the hospital encounter of 02/11/11  MRSA PCR SCREENING     Status: Normal   Collection Time   02/11/11 11:05 PM      Component Value Range Status Comment   MRSA by PCR NEGATIVE  NEGATIVE  Final      Tests / Events: 02/11/11 CT Head: NAICP, left temporal frontal encephalomalacia from prior infarct    OVERNIGHT/INTERVAL HX Still on cardene gtt - bp rises when he comes off Still aphasic and communicating through gestures Taking POs - cleared by speehc for swallow  Vital Signs:   Filed Vitals:   02/12/11 1030 02/12/11 1100 02/12/11 1130 02/12/11 1200  BP: 137/95 188/98 149/84 135/93  Pulse: 88 132 97 76  Temp:  98.5 F (36.9 C)    TempSrc:  Oral    Resp: 15 23 18 15   Height:      Weight:      SpO2: 100% 99% 100% 100%    Physical Examination: Gen: good spirits, no acute distress HEENT: NCAT, PERRL, EOMi, OP clear,  Neck: supple without masses PULM: CTA B CV: RRR, no mgr, no JVD AB: BS+, soft, nontender, no hsm Ext: warm, no edema, no clubbing, no cyanosis Derm: no rash or skin breakdown Neuro: awake and alert, speaks in one word responses, cannot answer questions completely, the few words he does speak are clear, see deetaled exam by neuro Psyche: Normal mood and affect  Labs and Imaging:   CBC   Component Value Date/Time   WBC 4.1 02/12/2011 0420   RBC 5.65 02/12/2011 0420   HGB 14.8 02/12/2011 0420   HCT 45.0 02/12/2011 0420   PLT 351 02/12/2011 0420   MCV 79.6 02/12/2011 0420   MCH 26.2 02/12/2011 0420   MCHC 32.9 02/12/2011 0420   RDW 15.2 02/12/2011 0420   LYMPHSABS 1.6 02/11/2011 1734   MONOABS 0.5 02/11/2011 1734   EOSABS 0.3 02/11/2011 1734   BASOSABS 0.0 02/11/2011 1734    BMET    Component Value Date/Time   NA 140 02/12/2011 0420   K 3.4* 02/12/2011 0420   CL 101 02/12/2011 0420   CO2 27 02/12/2011 0420   GLUCOSE 84 02/12/2011 0420   BUN 9 02/12/2011 0420   CREATININE 0.87 02/12/2011 0420   CALCIUM 9.6 02/12/2011 0420   GFRNONAA >90 02/12/2011 0420   GFRAA >90 02/12/2011 0420   Lab Results  Component Value Date   CKTOTAL 111 02/12/2011   CKMB 3.2 02/12/2011   TROPONINI <0.30 02/12/2011     UDS: negative for cocaine  MRI pending today  Assessment and Plan:  56 y/o male with a history of HTN, DM, prior L MCA stroke presented for evaluation of new onset aphasia of uncertain time of  onset.  Found to be profoundly hypertensive.  Currently without clear new motor deficits.  Ddx of symptoms is hypertensive emergency vs. new ischaemic stroke.  Hypertensive emergency (02/11/2011)   Assessment: likely cause of neurologic symptoms, due to medication non-compliance   Plan:  -nicardipine gtt started in ED has BP at goal for now -maintain on nicardipine gtt overnight - start home oral meds first on 02/12/11 AM   Aphasia (02/11/2011)   Assessment: neurology consulting now,  Symptoms likely due to hypertensive emergency, will discuss possibility of new ischaemic stroke with them   Plan:  -per neurology -cleared to take pos  Diabetes mellitus (02/11/2011)   Assessment:    Plan:  -ssi and cbg  Cardiomyopathy (02/11/2011)   Assessment: per notes due to alcohol abuse (LVEF 20-25% per 01/2010 study). Trop negative\  .   Plan:  -monitor volume status closely -repeat echo  Alcohol abuse   Assessment: not  intoxicated or withdrawing now   Plan: -banana bag -monitor for withdrawal    Best practices / Disposition: ICU status on PCCM service, neurology consulting  Feeding/protein malnutrition: npo Analgesia: n/a Sedation: n/a Thromboprophylaxis: sub q hep HOB >30 degrees Ulcer prophylaxis: n/a Glucose control/hyperglycemia: ssi + cbg  The patient is critically ill with multiple organ systems failure and requires high complexity decision making for assessment and support, frequent evaluation and titration of therapies, application of advanced monitoring technologies and extensive interpretation of multiple databases. Critical Care Time devoted to patient care services described in this note is 35 minutes.   Dr. Kalman Shan, M.D., [facility].C.P Pulmonary and Critical Care Medicine Staff Physician [facility] Pager: [phone], If no answer or between  15:00h - 7:00h: call [phone]     02/12/2011, 12:46 PM

## 2011-02-12 NOTE — Progress Notes (Signed)
*  PRELIMINARY RESULTS*  Carotid Doppler has been performed. Bilateral:  No evidence of hemodynamically significant internal carotid artery stenosis.   Vertebral artery flow is antegrade.      Farrel Demark RDMS 02/12/2011, 10:03 AM

## 2011-02-13 ENCOUNTER — Encounter (HOSPITAL_COMMUNITY): Payer: Self-pay | Admitting: Physician Assistant

## 2011-02-13 DIAGNOSIS — I428 Other cardiomyopathies: Secondary | ICD-10-CM

## 2011-02-13 DIAGNOSIS — R4701 Aphasia: Secondary | ICD-10-CM

## 2011-02-13 DIAGNOSIS — I10 Essential (primary) hypertension: Secondary | ICD-10-CM

## 2011-02-13 LAB — BASIC METABOLIC PANEL
BUN: 13 mg/dL (ref 6–23)
CO2: 27 mEq/L (ref 19–32)
Calcium: 9.9 mg/dL (ref 8.4–10.5)
Chloride: 99 mEq/L (ref 96–112)
Creatinine, Ser: 1.06 mg/dL (ref 0.50–1.35)
Glucose, Bld: 88 mg/dL (ref 70–99)

## 2011-02-13 LAB — CBC
HCT: 46.5 % (ref 39.0–52.0)
MCHC: 34.6 g/dL (ref 30.0–36.0)
Platelets: 376 10*3/uL (ref 150–400)
RDW: 14.7 % (ref 11.5–15.5)
WBC: 4.3 10*3/uL (ref 4.0–10.5)

## 2011-02-13 LAB — GLUCOSE, CAPILLARY
Glucose-Capillary: 96 mg/dL (ref 70–99)
Glucose-Capillary: 87 mg/dL (ref 70–99)

## 2011-02-13 LAB — CARDIAC PANEL(CRET KIN+CKTOT+MB+TROPI)
CK, MB: 2.5 ng/mL (ref 0.3–4.0)
Total CK: 89 U/L (ref 7–232)
Troponin I: <0.3 ng/mL (ref <0.30)

## 2011-02-13 LAB — MAGNESIUM: Magnesium: 2.2 mg/dL (ref 1.5–2.5)

## 2011-02-13 MED ORDER — LABETALOL HCL 5 MG/ML IV SOLN
20.0000 mg | INTRAVENOUS | Status: DC | PRN
  Filled 2011-02-13: qty 4

## 2011-02-13 MED ORDER — NICARDIPINE HCL IN NACL 20-0.86 MG/200ML-% IV SOLN
5.0000 mg/h | INTRAVENOUS | Status: DC
  Filled 2011-02-13: qty 200

## 2011-02-13 MED ORDER — POTASSIUM CHLORIDE CRYS ER 20 MEQ PO TBCR
40.0000 meq | EXTENDED_RELEASE_TABLET | Freq: Once | ORAL | Status: AC
  Administered 2011-02-13: 40 meq via ORAL

## 2011-02-13 MED ORDER — POTASSIUM CHLORIDE CRYS ER 20 MEQ PO TBCR
EXTENDED_RELEASE_TABLET | ORAL | Status: AC
  Administered 2011-02-13: 40 meq via ORAL
  Filled 2011-02-13: qty 2

## 2011-02-13 MED ORDER — SPIRONOLACTONE 25 MG PO TABS
25.0000 mg | ORAL_TABLET | Freq: Every day | ORAL | Status: DC
  Administered 2011-02-14: 25 mg via ORAL
  Filled 2011-02-13 (×2): qty 1

## 2011-02-13 MED ORDER — THIAMINE HCL 100 MG/ML IJ SOLN
INTRAVENOUS | Status: DC
  Administered 2011-02-14: 02:00:00 via INTRAVENOUS
  Filled 2011-02-13 (×3): qty 1000

## 2011-02-13 MED ORDER — SODIUM CHLORIDE 0.9 % IV SOLN
INTRAVENOUS | Status: DC
  Administered 2011-02-13 – 2011-02-14 (×3): via INTRAVENOUS

## 2011-02-13 NOTE — Progress Notes (Signed)
Stroke Team Progress Note  SUBJECTIVE  Jeremy Howell is a 56 y.o. male whose reason for Consult: patient presented with aphasia and right arm weakness, clumsiness in the setting of significantly elevated blood pressure. 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 New stroke or expansion of stroke ?   His therapists are at the bedside. Overall he feels his condition is unchanged.his blood pressure has been controlled now on IV Cardizem drip.    OBJECTIVE Most recent Vital Signs: Temp: 98.3 F (36.8 C) (01/04 0700) Temp src: Oral (01/04 0700) BP: 140/102 mmHg (01/04 0700) Pulse Rate: 85  (01/04 0700) Respiratory Rate: 17 O2 Saturation: 96%  CBG (last 3)   Basename 02/12/11 2146 02/12/11 1719 02/12/11 1144  GLUCAP 89 96 105*   Intake/Output from previous day: 01/03 0701 - 01/04 0700 In: 2037.5 [P.O.:600; I.V.:1437.5] Out: 2575 [Urine:2575]  IV Fluid Intake:     . niCARDipine 5 mg/hr (02/13/11 0700)  . banana bag IV fluid 1000 mL 100 mL/hr at 02/13/11 0137   Medications   . amLODipine  10 mg Oral Daily  . aspirin  300 mg Rectal Daily   Or  . aspirin  325 mg Oral Daily  . carvedilol  12.5 mg Oral BID WC  . enoxaparin  40 mg Subcutaneous Q24H  . folic acid  1 mg Oral Daily  . furosemide  20 mg Oral Daily  . hydrochlorothiazide  25 mg Oral Daily  . hydrochlorothiazide  25 mg Oral Daily  . insulin aspart  0-15 Units Subcutaneous TID WC  . olmesartan  40 mg Oral Daily  . DISCONTD: heparin  5,000 Units Subcutaneous Q8H  . DISCONTD: Olmesartan-Amlodipine-HCTZ  1 tablet Oral Daily  PRN sodium chloride, senna-docusate  Diet:  Cardiac thin Activity:  Up with assistance DVT Prophylaxis:  Lovenox 40 mg sq daily   Studies: CBC    Component Value Date/Time   WBC 4.3 02/13/2011 0350   RBC 5.94* 02/13/2011 0350   HGB 16.1 02/13/2011 0350   HCT 46.5 02/13/2011 0350   PLT 376 02/13/2011 0350   MCV 78.3 02/13/2011 0350   MCH 27.1 02/13/2011 0350   MCHC 34.6  02/13/2011 0350   RDW 14.7 02/13/2011 0350   LYMPHSABS 1.6 02/11/2011 1734   MONOABS 0.5 02/11/2011 1734   EOSABS 0.3 02/11/2011 1734   BASOSABS 0.0 02/11/2011 1734   CMP    Component Value Date/Time   NA 137 02/13/2011 0350   K 3.0* 02/13/2011 0350   CL 99 02/13/2011 0350   CO2 27 02/13/2011 0350   GLUCOSE 88 02/13/2011 0350   BUN 13 02/13/2011 0350   CREATININE 1.06 02/13/2011 0350   CALCIUM 9.9 02/13/2011 0350   PROT 7.3 01/21/2010 2303   ALBUMIN 3.6 01/21/2010 2303   AST 17 01/21/2010 2303   ALT 11 01/21/2010 2303   ALKPHOS 79 01/21/2010 2303   BILITOT 0.4 01/21/2010 2303   GFRNONAA 77* 02/13/2011 0350   GFRAA 89* 02/13/2011 0350   COAGS Lab Results  Component Value Date   INR 0.96 02/11/2011   INR 1.04 01/21/2010   Lipid Panel    Component Value Date/Time   CHOL 236* 02/12/2011 0420   TRIG 97 02/12/2011 0420   HDL 54 02/12/2011 0420   CHOLHDL 4.4 02/12/2011 0420   VLDL 19 02/12/2011 0420   LDLCALC 163* 02/12/2011 0420   HgbA1C  Lab Results  Component Value Date   HGBA1C 5.9* 02/12/2011   Urine Drug Screen  Component Value Date/Time   LABOPIA POSITIVE* 02/11/2011 1926   COCAINSCRNUR NONE DETECTED 02/11/2011 1926   LABBENZ NONE DETECTED 02/11/2011 1926   AMPHETMU NONE DETECTED 02/11/2011 1926   THCU NONE DETECTED 02/11/2011 1926   LABBARB NONE DETECTED 02/11/2011 1926    Alcohol Level    Component Value Date/Time   ETH <11 02/11/2011 2014     Results for orders placed during the hospital encounter of 02/11/11 (from the past 24 hour(s))  CARDIAC PANEL(CRET KIN+CKTOT+MB+TROPI)     Status: Abnormal   Collection Time   02/12/11  9:00 AM      Component Value Range   Total CK 111  7 - 232 (U/L)   CK, MB 3.2  0.3 - 4.0 (ng/mL)   Troponin I <0.30  <0.30 (ng/mL)   Relative Index 2.9 (*) 0.0 - 2.5   GLUCOSE, CAPILLARY     Status: Abnormal   Collection Time   02/12/11 11:44 AM      Component Value Range   Glucose-Capillary 105 (*) 70 - 99 (mg/dL)  GLUCOSE, CAPILLARY     Status: Normal   Collection Time    02/12/11  5:19 PM      Component Value Range   Glucose-Capillary 96  70 - 99 (mg/dL)   Comment 1 Documented in Chart     Comment 2 Notify RN    CARDIAC PANEL(CRET KIN+CKTOT+MB+TROPI)     Status: Normal   Collection Time   02/12/11  9:12 PM      Component Value Range   Total CK 117  7 - 232 (U/L)   CK, MB 2.7  0.3 - 4.0 (ng/mL)   Troponin I <0.30  <0.30 (ng/mL)   Relative Index 2.3  0.0 - 2.5   GLUCOSE, CAPILLARY     Status: Normal   Collection Time   02/12/11  9:46 PM      Component Value Range   Glucose-Capillary 89  70 - 99 (mg/dL)   Comment 1 Notify RN     Comment 2 Documented in Chart    CBC     Status: Abnormal   Collection Time   02/13/11  3:50 AM      Component Value Range   WBC 4.3  4.0 - 10.5 (K/uL)   RBC 5.94 (*) 4.22 - 5.81 (MIL/uL)   Hemoglobin 16.1  13.0 - 17.0 (g/dL)   HCT 16.1  09.6 - 04.5 (%)   MCV 78.3  78.0 - 100.0 (fL)   MCH 27.1  26.0 - 34.0 (pg)   MCHC 34.6  30.0 - 36.0 (g/dL)   RDW 40.9  81.1 - 91.4 (%)   Platelets 376  150 - 400 (K/uL)  CARDIAC PANEL(CRET KIN+CKTOT+MB+TROPI)     Status: Normal   Collection Time   02/13/11  3:50 AM      Component Value Range   Total CK 89  7 - 232 (U/L)   CK, MB 2.5  0.3 - 4.0 (ng/mL)   Troponin I <0.30  <0.30 (ng/mL)   Relative Index RELATIVE INDEX IS INVALID  0.0 - 2.5   LACTIC ACID, PLASMA     Status: Normal   Collection Time   02/13/11  3:50 AM      Component Value Range   Lactic Acid, Venous 0.8  0.5 - 2.2 (mmol/L)  BASIC METABOLIC PANEL     Status: Abnormal   Collection Time   02/13/11  3:50 AM      Component Value Range   Sodium  137  135 - 145 (mEq/L)   Potassium 3.0 (*) 3.5 - 5.1 (mEq/L)   Chloride 99  96 - 112 (mEq/L)   CO2 27  19 - 32 (mEq/L)   Glucose, Bld 88  70 - 99 (mg/dL)   BUN 13  6 - 23 (mg/dL)   Creatinine, Ser 1.61  0.50 - 1.35 (mg/dL)   Calcium 9.9  8.4 - 09.6 (mg/dL)   GFR calc non Af Amer 77 (*) >90 (mL/min)   GFR calc Af Amer 89 (*) >90 (mL/min)  MAGNESIUM     Status: Normal   Collection Time    02/13/11  3:50 AM      Component Value Range   Magnesium 2.2  1.5 - 2.5 (mg/dL)  PHOSPHORUS     Status: Normal   Collection Time   02/13/11  3:50 AM      Component Value Range   Phosphorus 3.4  2.3 - 4.6 (mg/dL)   CT of the brain   1.  No acute intracranial findings identified. 2.  Left temporal frontal encephalomalacia related to previously demonstrated infarct.   MRI of the brain  No new stroke this admission. Motion degraded exam.  Previously noted large left hemispheric infarct has progressed as expected now with a broad area of encephalomalacia involving portion of the left temporal lobe, left periopercular region and left parietal lobe with mild dilation of the left lateral ventricle.  Questionable tiny acute infarct medial aspect of the right thalamus (series 4 image 13).  Remote right parietal lobe small infarct.  Mild small vessel disease type changes.   MRA of the brain   Exam limited by motion with suggestion of intracranial atherosclerotic type changes as detailed above.   2D Echocardiogram  EF 30-35% with no source of embolus.   Carotid Doppler  No internal carotid artery stenosis bilaterally. Vertebrals with antegrade flow bilaterally.    CXR   Progressive cardiomegaly.  No acute abnormality.  EKG normal sinus rhythm, ischemic changes noted in T wave, LVH, probable left atrial abnormality.  Physical Exam   Present middle-aged [place] American gentleman currently not in distress. Sitting up in bed. Afebrile. Neck is supple without bruit. Head is not traumatic. Cardiac exam no murmur or gallop. Lungs are clear to auscultation. Neurological Exam :  Awake alert significant expressive greater than receptive aphasia. Speaks only short sentences and a few words. Significant word hesitancy and paraphasic errors. Comprehension also significantly impaired. Unable to repeat on name.Eye movements are full range without nystagmus. Blinks to threat bilaterally.mild right lower facial  weakness. Spastic right hemiparesis with grade 3/5 strength in the right upper extremity and 4/5 strength in right lower extremity. Increased tone on the right. Good strength on the left side. Coordination is impaired on the right. Sensation is preserved. Gait was not tested.  ASSESSMENT [name] is a 56 y.o. male with a aphasia and right arm hemiparesis, secondary worsening of old deficits from large left brain infarct 12 mos ago in the setting of hypertensive emergency. No new stroke. On aspirin 325 mg orally every day for secondary stroke prevention.  Hypertensive urgency on Cardene drip  Hyperlipidemia  Stroke risk factors:  hyperlipidemia, hypertension and coronary artery disease and previous stroke  Hospital day # 2  TREATMENT/PLAN Continue aspirin 325 mg orally every day for secondary stroke prevention. Diet as tolerated. Out of bed. Recommend starting oral antihypertensives and weaning Cardene. Transfer to the floor when he is off the Cardene drip.Mobilize out of bed  and therapy consults.Sign off call for questions.  Joaquin Music, ANP-BC, GNP-BC [facility] Pager: [phone] 02/13/2011 8:19 AM  Dr. Delia Heady, Stroke Center Medical Director, has personally reviewed chart, pertinent data, examined the patient and developed the plan of care.

## 2011-02-13 NOTE — Consult Note (Signed)
CARDIOLOGY CONSULT NOTE  Patient ID: Jeremy Howell, MRN: [id number], DOB/AGE: 07-06-1955 56 y.o. Admit date: 02/11/2011 Date of Consult: 02/13/2011  Primary Physician: Dorrene German, MD, MD Primary Cardiologist: Never seen in office, but saw Dr. Gala Romney in consult in 2011 and would benefit from CHF MD  Chief Complaint: difficulty talking Reason for Consultation: assist with CHF regimen, currently clinically compensated, here for acute CVA and hypertensive urgency  HPI: 56 y/o M with hx of cardiomyopathy, polysubstance abuse, DM, CVA presented intiially to an outside clinic with difficulty speaking & was found at [facility] to have hypertensive urgency (212/122). CT head was negative for acute abnormality, however follow-up MRI showed worsening of old left brain infarct on MRI this admission. 2D echo was obtained showing EF of 30-35% with no RWMA. Previous EF was 20-25% in 01/2010. BNP is 182. He is currently felt to be euvolemic but we have been asked to see him regarding CHF control. Current regimen includes ASA, Norvasc, Coreg 12.5mg  bid, Lasix 20mg  daily, HCTZ 25mg  daily, Benicar 40mg  daily. Blood pressure has improved from admit to 139/98 today (diastolics still running 90's-1teens).  Past Medical History  Diagnosis Date  . Cardiomyopathy     EF 20-25% by echo 01/2010 with severe LVH felt secondary to substance abuse (no ischemic workup)  . 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 Diabetes mellitus   . HTN (hypertension)   . Polysubstance abuse     Reported hx of cocaine, THC, heroin, EtOH abuse  . Noncompliance       Surgical History: History reviewed. No pertinent past surgical history.   Home Meds: Medication Sig  carvedilol (COREG) 12.5 MG tablet Take 12.5 mg by mouth 2 (two) times daily with a meal.    cloNIDine (CATAPRES - DOSED IN MG/24 HR) 0.3 mg/24hr Place 1 patch onto the skin once a week.    folic acid  (FOLVITE) 1 MG tablet Take 1 mg by mouth daily.    furosemide (LASIX) 20 MG tablet Take 20 mg by mouth daily.    hydrochlorothiazide (HYDRODIURIL) 25 MG tablet Take 25 mg by mouth daily.    Olmesartan-Amlodipine-HCTZ (TRIBENZOR) 40-10-25 MG TABS Take 1 tablet by mouth daily.    oxyCODONE-acetaminophen (PERCOCET) 5-325 MG per tablet Take 1 tablet by mouth every 4 (four) hours as needed.   spironolactone (ALDACTONE) 25 MG tablet Take 25 mg by mouth daily.      Inpatient Medications:    . amLODipine  10 mg Oral Daily  . aspirin  325 mg Oral Daily  . carvedilol  12.5 mg Oral BID WC  . enoxaparin  40 mg Subcutaneous Q24H  . folic acid  1 mg Oral Daily  . furosemide  20 mg Oral Daily  . hydrochlorothiazide  25 mg Oral Daily  . hydrochlorothiazide  25 mg Oral Daily  . insulin aspart  0-15 Units Subcutaneous TID WC  . olmesartan  40 mg Oral Daily  . potassium chloride  40 mEq Oral Once  . banana bag IV fluid 1000 mL   Intravenous Q24H  . DISCONTD: aspirin  300 mg Rectal Daily    Allergies:  Allergies  Allergen Reactions  . Almond Oil Shortness Of Breath and Swelling    History   Social History  . Marital Status: Single    Spouse Name: N/A    Number of Children: N/A  . Years of Education: N/A  Occupational History  . Not on file.   Social History Main Topics  . Smoking status: Current Everyday Smoker    Types: Cigarettes  . Smokeless tobacco: Not on file   Comment: declined  . Alcohol Use: Yes  . Drug Use: Not on file     + previous history  . Sexually Active: Not on file   Fam Hx: Denies any known hx of heart disease in family   Review of Systems: difficult given aphasia but does answer "no" to the following: General: negative for chills, fever, or weight changes.  Cardiovascular: negative for chest pain, edema, orthopnea, palpitations, paroxysmal nocturnal dyspnea, shortness of breath or dyspnea on exertion Dermatological: negative for rash Respiratory: negative  for cough, wheezing Urologic: negative for hematuria Abdominal: negative for nausea, vomiting, diarrhea, bright red blood per rectum, melena, or hematemesis Neurologic: negative for visual changes, syncope. Otherwise see above All other systems reviewed and are otherwise negative except as noted above.  Labs:  [facility] 02/13/11 0350 02/12/11 2112 02/12/11 0900 02/11/11 2339  CKTOTAL 89 117 111 135  CKMB 2.5 2.7 3.2 2.9  TROPONINI <0.30 <0.30 <0.30 <0.30   Lab Results  Component Value Date   WBC 4.3 02/13/2011   HGB 16.1 02/13/2011   HCT 46.5 02/13/2011   MCV 78.3 02/13/2011   PLT 376 02/13/2011    Lab 02/13/11 0350  NA 137  K 3.0*  CL 99  CO2 27  BUN 13  CREATININE 1.06  CALCIUM 9.9  PROT --  BILITOT --  ALKPHOS --  ALT --  AST --  GLUCOSE 88   Lab Results  Component Value Date   CHOL 236* 02/12/2011   HDL 54 02/12/2011   LDLCALC 782* 02/12/2011   TRIG 97 02/12/2011   Radiology/Studies:  1. Chest 2 View 02/11/2011    IMPRESSION: Progressive cardiomegaly.  No acute abnormality.    2. Ct Head Wo Contrast 02/11/2011 IMPRESSION:  1.  No acute intracranial findings identified. 2.  Left temporal frontal encephalomalacia related to previously demonstrated infarct.    3. MR Brain Wo Contrast 02/12/2011   IMPRESSION: Motion degraded exam.  Previously noted large left hemispheric infarct has progressed as expected now with a broad area of encephalomalacia involving portion of the left temporal lobe, left periopercular region and left parietal lobe with mild dilation of the left lateral ventricle.  Questionable tiny acute infarct medial aspect of the right thalamus (series 4 image 13).  Remote right parietal lobe small infarct.  Mild small vessel disease type changes.    4. Mr Maxine Glenn Head/brain Wo Cm 02/12/2011  IMPRESSION: Exam limited by motion with suggestion of intracranial atherosclerotic type changes as detailed above.    5. 2D echo 02/12/11 - Left ventricle: The cavity size was normal. Wall  thickness was increased in a pattern of severe LVH. Systolic function was moderately to severely reduced. The estimated ejection fraction was in the range of 30% to 35%. Wall motion was normal; there were no regional wall motion abnormalities. Doppler parameters are consistent with abnormal left ventricular relaxation (grade 1 diastolic dysfunction). - Left atrium: The atrium was mildly dilated. Impressions: - No cardiac source of emboli was indentified.  EKG: NSR LVH TWI I, II, III, avF, V5-V6 with ST depression V5-V6. No prior to compare to but prior consult notes allude to previous deep TW inversion in precordial leads.  Physical Exam: Blood pressure 139/98, pulse 86, temperature 98.4 F (36.9 C), temperature source Oral, resp. rate 16, height 6' (1.829  m), weight 135 lb 9.3 oz (61.5 kg), SpO2 97.00%. General: Well developed thin AAM in no acute distress. Head: Normocephalic, atraumatic, sclera non-icteric, no xanthomas, nares are without discharge.  Neck: Negative for carotid bruits. JVD not elevated. Lungs: Clear bilaterally to auscultation without wheezes, rales, or rhonchi. Breathing is unlabored. Heart: RRR with S1 S2. No murmurs, rubs, or gallops appreciated. Abdomen: Soft, non-tender, non-distended with normoactive bowel sounds. No hepatomegaly. No rebound/guarding. No obvious abdominal masses. Msk:  Strength and tone appear normal for age. Extremities: No clubbing or cyanosis. No edema.  Distal pedal pulses are 2+ and equal bilaterally. Neuro: Appears A+Ox3 with use of writing the answers. He is aphasic and answers some questions inappropriately (i.e. With repetition or completely unrelated words) and others he is briefly understandable. He does fully follow commands correctly. Strength is decreased RUE but does move all extremities spontaneously.     Assessment and Plan:   1. Aphasia felt by neurology secondary to worsening of old deficits of prior large brain infarct of hypertensive  emergency - he has baseline aphasia. On ASA for secondary stroke prevention. No embolic source on TTE.  2. Hypertensive urgency - being titrated off nicardipine gtt with improved pressures from admission although diastolics are still high.  3. Cardiomyopathy - currently well compensated. Continue ARB, Lasix, BB (Coreg chosen previously with hx of cocaine but UDS negative this time - has room for uptitration given that he is not acutely decompensated). Consider substitution of spironolactone for HCTZ given decreased EF.   4. Hypokalemia - primary team has been managing this AM.  Signed, Ronie Spies PA-C 02/13/2011, 2:24 PM   As above; patient seen and examined; 56 yo male with PMH CVA, cardiomyopathy, hypertension, DM for evaluation of cardiomyopathy. Admitted with worsening aphasia and hypertensive urgency. Echo shows EF 30-35 (previous EF 20-25). Unable to obtain history as patient aphasic. Chest CTA; CV RRR s4 ext no edema. Enzymes negative. Would continue ARB and coreg (increase as needed for BP); dc HCTZ and add spironolactone 25 mg daily. Continue norvasc. If BP not controlled with above measures, add hydralazine nitrates. FU CHF clinic. Olga Millers

## 2011-02-13 NOTE — Progress Notes (Signed)
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  Apparently he went to be seen at an outside clinic today for these symptoms and was sent here by EMS for further evaluation.  The onset of symptoms is unknonwn.  Upon arrival he was in good spirits but had difficulty communicating.  His BP was 212/122.  A stat head CT showed no new changes and he was started on a nicardipine gtt.  Lines / Drains:   Cultures / Sepsis markers: Anti-infectives    None       Antibiotics: Results for orders placed during the hospital encounter of 02/11/11  MRSA PCR SCREENING     Status: Normal   Collection Time   02/11/11 11:05 PM      Component Value Range Status Comment   MRSA by PCR NEGATIVE  NEGATIVE  Final      Tests / Events: 02/11/11 CT Head: NAICP, left temporal frontal encephalomalacia from prior infarct 02/12/11 - still on cardene. Cleared by speech for swallo    OVERNIGHT/INTERVAL HX Off cardene gtt since 10am today On PO meds for bp with prn labetelol - bp ok right now Baseline aphasia + Swalliowing oka Wants to go home unclear why   Vital Signs:   Filed Vitals:   02/13/11 0900 02/13/11 0944 02/13/11 1000 02/13/11 1100  BP: 146/90 150/83 136/86 154/89  Pulse: 86  71 76  Temp:      TempSrc:      Resp: 18  16 16   Height:      Weight:      SpO2: 97%  99% 97%    Physical Examination: Gen: good spirits, no acute distress HEENT: NCAT, PERRL, EOMi, OP clear,  Neck: supple without masses PULM: CTA B CV: RRR, no mgr, no JVD AB: BS+, soft, nontender, no hsm Ext: warm, no edema, no clubbing, no cyanosis Derm: no rash or skin breakdown Neuro: awake and alert, speaks in one word responses, cannot answer questions completely, the few words he does speak are clear, see deetaled exam by neuro consult Psyche: Normal  mood and affect  Labs and Imaging:   CBC    Component Value Date/Time   WBC 4.3 02/13/2011 0350   RBC 5.94* 02/13/2011 0350   HGB 16.1 02/13/2011 0350   HCT 46.5 02/13/2011 0350   PLT 376 02/13/2011 0350   MCV 78.3 02/13/2011 0350   MCH 27.1 02/13/2011 0350   MCHC 34.6 02/13/2011 0350   RDW 14.7 02/13/2011 0350   LYMPHSABS 1.6 02/11/2011 1734   MONOABS 0.5 02/11/2011 1734   EOSABS 0.3 02/11/2011 1734   BASOSABS 0.0 02/11/2011 1734    BMET    Component Value Date/Time   NA 137 02/13/2011 0350   K 3.0* 02/13/2011 0350   CL 99 02/13/2011 0350   CO2 27 02/13/2011 0350   GLUCOSE 88 02/13/2011 0350   BUN 13 02/13/2011 0350   CREATININE 1.06 02/13/2011 0350   CALCIUM 9.9 02/13/2011 0350   GFRNONAA 77* 02/13/2011 0350   GFRAA 89* 02/13/2011 0350   Lab Results  Component Value Date   CKTOTAL 89 02/13/2011   CKMB 2.5 02/13/2011   TROPONINI <0.30 02/13/2011     UDS: negative for cocaine  MRI pending today  Assessment and Plan:  56 y/o male with a history of HTN,  DM, prior L MCA stroke presented for evaluation of new onset aphasia of uncertain time of onset.  Found to be profoundly hypertensive.  Currently without clear new motor deficits.  Ddx of symptoms is hypertensive emergency vs. new ischaemic stroke. Neuro consult feels is hypertensive emergency. APhasia is old  Hypertensive emergency (02/11/2011)   Assessment:  due to medication non-compliance. On 02/13/11 - off cardene gtt few hours aago   Plan:  - Po meds - prn iV labetelol  Aphasia (02/11/2011)   Assessment: neurology consulting No new stroke. Cleared to take po on 02/12/11. Neuro signed off 02/13/11 PLAn  monitor  Diabetes mellitus (02/11/2011)   Assessment:    Plan:  -ssi and cbg  Cardiomyopathy (02/11/2011)   Assessment: per notes due to alcohol abuse (LVEF 20-25% per 01/2010 study). Trop negative. ECHO 02/12/11:   Systolic function was moderately to severely reduced. The estimated ejection fraction was in the range of 30% to 35%. Wall motion was normal. It  appears this is baseline. Currently euvolemic, Known Gate patient .   Plan:  -cards consult [facility] called: might benefit from chf service wit bp and chf control  Alcohol abuse   Assessment: not intoxicated or withdrawing now   Plan: -banana bag -monitor for withdrawal    Best practices / Disposition: Counseled him against dc: he agrees to stay 1-2 more days. Move to step down. [facility] team 8 to pick up 02/14/11 - d/w Dr Butler [place]  Feeding/protein malnutrition: po feeds Analgesia: n/a Sedation: n/a Thromboprophylaxis: sub q lovenox HOB >30 degrees Ulcer prophylaxis: n/a Glucose control/hyperglycemia: ssi + cbg   Dr. Kalman Shan, M.D., [facility].C.P Pulmonary and Critical Care Medicine Staff Physician [facility] Pager: [phone], If no answer or between  15:00h - 7:00h: call [phone]     02/13/2011, 12:23 PM

## 2011-02-13 NOTE — Procedures (Signed)
EEG ID:  [id number]  HISTORY:  A 56 years old man referred for rule out seizures per report.  MEDICATIONS:  No anticonvulsant medications per report.  CONDITION OF RECORDING:  This 16-lead EEG was recorded with the patient in awake and drowsy states.  Background rhythms: background patterns in wakefulness were well organized with a well-sustained posterior dominant rhythm of 9.5 Hz, symmetrical and reactive to eye opening and closing. Drowsiness was associated with mild change in voltage and slowing frequencies.  Abnormal potentials: continuous left frontal slowing was noted.  No epileptiform activity was noted.  ACTIVATION PROCEDURES:  Hyperventilation was not performed.  Photic stimulation did not activate tracing.  EKG:  Single-channel of EKG monitoring was unremarkable.  IMPRESSION:  This is an abnormal awake and drowsy EEG due to the presence of continuous left frontal slowing.  The finding may be suggestive of an underlying structural or functional abnormality of an etiology in that head region.  Clinical correlation suggested.          ______________________________ Carmell Austria, MD    [id number] D:  02/12/2011 15:21:07  T:  02/12/2011 22:11:21  Job #:  [id number]

## 2011-02-13 NOTE — Progress Notes (Signed)
Speech Language/Pathology Speech Language Pathology Evaluation Patient Details Name: Jeremy Howell MRN: [id number] DOB: Oct 02, 1955 Today's Date: 02/13/2011  Problem List:  Patient Active Problem List  Diagnoses  . Hypertensive emergency  . Diabetes mellitus  . Cardiomyopathy  . Aphasia   Past Medical History:  Past Medical History  Diagnosis Date  . Coronary artery disease   . Stroke    Past Surgical History: History reviewed. No pertinent past surgical history.  SLP Assessment/Plan/Recommendation Assessment Clinical Impression Statement: Pt. exhibits mod-severe expressive (Broca's) aphasia with dysfluent speech, difficutly with visual confrontational naming and automatic speech tasks and receptive language with difficutly comprehending yes/no questions (biographical, environmental) and simple language/commands.  Pt. expression is stimulable with sentence and phonemic cues .  Written cues were not effective to assist in auditory comprehension.  SLP was unable to ascertain the severity of current language deficits compared to  CVA in Dec.Pt. would benefit from ST services. SLP Recommendation/Assessment: Patient will need skilled Speech Lanaguage Pathology Services in the acute care venue to address identified deficits Problem List: Reading comprehension;Auditory comprehension;Written expression;Verbal expression;Problem Solving Therapy Diagnosis: Aphasia;Cognitive Impairments (? oral apraxia) Type of Aphasia: Broca's (expressive) Plan Speech Therapy Frequency: min 2x/week Duration: 2 weeks Treatment/Interventions: Environmental controls;Language facilitation;Cueing hierarchy;Internal/external aids;Cognitive reorganization;Functional tasks;Patient/family education;Compensatory strategies;SLP instruction and feedback;Multimodal communcation approach Potential to Achieve Goals: Good SLP Recommendations Follow up Recommendations: Home health SLP Equipment Recommended: None recommended  by PT Individuals Consulted Consulted and Agree with Results and Recommendations: Patient  SLP Goals  SLP Goals Potential to Achieve Goals: Good SLP Goal #1: Pt. will use a variety of strategies to express basic wants/needs with mod verbal/visual cues SLP Goal #2: Pt. will improve accuracy of basic information (yes/no) with moder verbal/visual cues SLP Goal #3: Pt. increase comprehension for basic commands for functional ADL's with mod verbal/visual cues.  SLP Evaluation Prior Functioning Cognitive/Linguistic Baseline: Baseline deficits (STROKE 12/12.  SUSPECT POSSIBLE DEFICITS) Type of Home:  (Unable to determine secondary to aphasia.) Lives With: Other (Comment) Romeo Apple and Steward Drone (unable to determine relationship to pt).) Receives Help From:  (Unable to determine secondary to aphasia.)  Cognition Arousal/Alertness: Awake/alert Orientation Level: Oriented to person;Oriented to situation Attention: Focused;Sustained Focused Attention: Appears intact Sustained Attention: Appears intact Memory: Appears intact (NEED TO ASSESS FURTHER) Awareness: Impaired Awareness Impairment: Anticipatory impairment Problem Solving: Impaired Problem Solving Impairment: Other (comment) (SUSPECT DIFFICULTY WITH COMPLEX INFO) Behaviors: Other (comment) (MILD, APPROPRIATE FRUSTRATION WITH VERBAL DIFFICULTIES) Safety/Judgment: Other (comment) (NEED TO FURTHER ASSESS)  Comprehension Auditory Comprehension Overall Auditory Comprehension: Impaired Yes/No Questions: Impaired Basic Biographical Questions: 0-25% accurate Basic Immediate Environment Questions: 50-74% accurate Commands: Impaired One Step Basic Commands: Other (comment) (DIFFICULTY WITH VISUAL CUE/IMITATION) Conversation: Simple EffectiveTechniques: Extra processing time;Repetition;Visual/Gestural cues Visual Recognition/Discrimination Discrimination: Not tested Reading Comprehension Reading Status: Not  tested  Expression Expression Primary Mode of Expression: Verbal Verbal Expression Overall Verbal Expression: Other (comment) (RECENT CVA WITH EXPRESSIVE APHASIA) Initiation: No impairment Automatic Speech: Counting;Day of week;Month of year;Singing (STATED NAME INDEP, COUNT W/ INITIATION/IMITATION CUE) Level of Generative/Spontaneous Verbalization: Phrase;Word Repetition: Impaired Level of Impairment: Word level Naming: Impairment Responsive: 0-25% accurate Confrontation: Impaired (DIFFICULTY WITH VISUAL CONFRONTATION NAMING) Verbal Errors: Echolalia;Aware of errors;Other (comment) (AWARE OF ERRORS 75% OF THE TIME) Pragmatics: No impairment Effective Techniques: Sentence completion;Phonemic cues Other Verbal Expression Comments: Accurately stated first and last name.  Able to state " I have trouble talking sometimes"  with extra time, deliberate speech Written Expression Written Expression: Not tested  Oral/Motor Oral Motor/Sensory Function Overall Oral Motor/Sensory Function: Appears  within functional limits for tasks assessed Labial ROM: Reduced right Labial Symmetry: Abnormal symmetry right Lingual ROM: Other (Comment) (IMPRECISION) Motor Speech Respiration: Within functional limits Phonation: Normal Resonance: Within functional limits Articulation: Within functional limitis Intelligibility: Intelligible Motor Speech Errors: Inconsistent (DIFFICULTY WITH ORAL-MOTOR MVMTS)  Breck Coons Rozalia Dino M.Ed [employer] [phone]  02/13/2011

## 2011-02-14 LAB — BASIC METABOLIC PANEL
CO2: 26 mEq/L (ref 19–32)
Calcium: 9.5 mg/dL (ref 8.4–10.5)
Creatinine, Ser: 1.03 mg/dL (ref 0.50–1.35)
Glucose, Bld: 75 mg/dL (ref 70–99)
Sodium: 137 mEq/L (ref 135–145)

## 2011-02-14 LAB — PHOSPHORUS: Phosphorus: 3.2 mg/dL (ref 2.3–4.6)

## 2011-02-14 MED ORDER — ACETAMINOPHEN 325 MG PO TABS: 650.0000 mg | ORAL_TABLET | Freq: Four times a day (QID) | ORAL | Status: DC | PRN

## 2011-02-14 NOTE — Progress Notes (Signed)
Subjective: Not communicated with me. Not even nodding his head. RN states he is angry about not being able to leave the hospital. He complained of chest pain to RN earlier and and EKG was done. This is unchanged from prior EKG and currently he will not communicate to me whether his pain has resolved.   Objective: Blood pressure 140/97, pulse 71, temperature 98 F (36.7 C), temperature source Oral, resp. rate 13, height 6' (1.829 m), weight 61.3 kg (135 lb 2.3 oz), SpO2 98.00%. Weight change: -0.2 kg (-7.1 oz)  Intake/Output Summary (Last 24 hours) at 02/14/11 1416 Last data filed at 02/14/11 1300  Gross per 24 hour  Intake 2556.66 ml  Output   1625 ml  Net 931.66 ml    Physical Exam: General appearance: alert, no distress and uncooperative Lungs: clear to auscultation bilaterally Heart: regular rate and rhythm, S1, S2 normal, no murmur, click, rub or gallop Abdomen: soft, non-tender; bowel sounds normal; no masses,  no organomegaly Extremities: extremities normal, atraumatic, no cyanosis or edema  Lab Results:  Basename 02/14/11 0600 02/13/11 0350  NA 137 137  K 3.5 3.0*  CL 102 99  CO2 26 27  GLUCOSE 75 88  BUN 15 13  CREATININE 1.03 1.06  CALCIUM 9.5 9.9  MG 1.8 2.2  PHOS 3.2 3.4   No results found for this basename: AST:2,ALT:2,ALKPHOS:2,BILITOT:2,PROT:2,ALBUMIN:2 in the last 72 hours No results found for this basename: LIPASE:2,AMYLASE:2 in the last 72 hours  Basename 02/13/11 0350 02/12/11 0420 02/11/11 1734  WBC 4.3 4.1 --  NEUTROABS -- -- 3.3  HGB 16.1 14.8 --  HCT 46.5 45.0 --  MCV 78.3 79.6 --  PLT 376 351 --    Basename 02/13/11 0350 02/12/11 2112 02/12/11 0900  CKTOTAL 89 117 111  CKMB 2.5 2.7 3.2  CKMBINDEX -- -- --  TROPONINI <0.30 <0.30 <0.30   No components found with this basename: POCBNP:3 No results found for this basename: DDIMER:2 in the last 72 hours  Basename 02/12/11 0420  HGBA1C 5.9*    Basename 02/12/11 0420  CHOL 236*  HDL 54    LDLCALC 163*  TRIG 97  CHOLHDL 4.4  LDLDIRECT --   No results found for this basename: TSH,T4TOTAL,FREET3,T3FREE,THYROIDAB in the last 72 hours No results found for this basename: VITAMINB12:2,FOLATE:2,FERRITIN:2,TIBC:2,IRON:2,RETICCTPCT:2 in the last 72 hours  Micro Results: Recent Results (from the past 240 hour(s))  MRSA PCR SCREENING     Status: Normal   Collection Time   02/11/11 11:05 PM      Component Value Range Status Comment   MRSA by PCR NEGATIVE  NEGATIVE  Final     Studies/Results: Dg Chest 2 View  02/11/2011  *RADIOLOGY REPORT*  Clinical Data: Stroke.  Aphasia.  CHEST - 2 VIEW  Comparison: 01/25/2010.  Findings: Enlarged cardiac silhouette with an interval increase in size.  Clear lungs with normal vascularity.  Mild scoliosis.  IMPRESSION: Progressive cardiomegaly.  No acute abnormality.  Original Report Authenticated By: Darrol Angel, M.D.   Ct Head Wo Contrast  02/11/2011  *RADIOLOGY REPORT*  Clinical Data: Confusion with altered mental status and memory loss.  CT HEAD WITHOUT CONTRAST  Technique:  Contiguous axial images were obtained from the base of the skull through the vertex without contrast.  Comparison: 01/28/2010 and 01/28/2010 head CTs.  Findings: There is a large area of encephalomalacia within the left temporal and frontal lobes related to the previously demonstrated infarct.  This demonstrates expected evolution without definite extension.  There is  no evidence of acute intracranial hemorrhage, mass lesion, brain edema or extra-axial fluid collection.  There is mild ventriculomegaly.  No midline shift is demonstrated.  The visualized paranasal sinuses remain clear.  The calvarium is intact.  IMPRESSION:  1.  No acute intracranial findings identified. 2.  Left temporal frontal encephalomalacia related to previously demonstrated infarct.  Original Report Authenticated By: Gerrianne Scale, M.D.   Mr Brain Wo Contrast  02/12/2011  *RADIOLOGY REPORT*  Clinical Data:   Receptive aphasia.   Right-sided weakness.  MRI HEAD WITHOUT CONTRAST MRA HEAD WITHOUT CONTRAST  Technique: Multiplanar, multiecho pulse sequences of the brain and surrounding structures were obtained according to standard protocol without intravenous contrast.  Angiographic images of the head were obtained using MRA technique without contrast.  Comparison: 02/11/2011 head CT.  01/23/2010 brain MR.  MRI HEAD  Findings:  Motion degraded exam.  Previously noted large left hemispheric infarct has progressed as expected now with a broad area of encephalomalacia involving portion of the left temporal lobe, left periopercular region and left parietal lobe with mild dilation of the left lateral ventricle.  Questionable tiny acute infarct medial aspect of the right thalamus (series 4 image 13).  Remote right parietal lobe small infarct.  Mild small vessel disease type changes.  Global atrophy without hydrocephalus.  No intracranial mass lesion detected on this unenhanced motion degraded exam.  Evaluation of the cervical medullary junction and upper cervical spine is limited by motion.  Suggestion of mild spinal stenosis upper cervical spine.  IMPRESSION: Motion degraded exam.  Previously noted large left hemispheric infarct has progressed as expected now with a broad area of encephalomalacia involving portion of the left temporal lobe, left periopercular region and left parietal lobe with mild dilation of the left lateral ventricle.  Questionable tiny acute infarct medial aspect of the right thalamus (series 4 image 13).  Remote right parietal lobe small infarct.  Mild small vessel disease type changes.  MRA HEAD  Findings: Motion degraded exam.  Secondary to the motion, grading stenosis accurately or evaluating for a small aneurysm is limited.  Findings raise possibility of stenosis involving; portion of the cavernous and supraclinoid aspect of the internal carotid artery bilaterally, proximal M1 segment of the right  middle cerebral artery and A1/A2  segment of the left anterior cerebral artery.  Decreased number of visualized left middle cerebral artery branches consistent with the patient's remote left hemispheric infarct.  Markedly limited evaluation suggests stenosis of the distal vertebral arteries and proximal basilar artery.  Poor delineation of the PICAs and AICAs.  Moderate stenosis superior cerebellar artery bilaterally more notable on the left.  Moderate stenosis proximal left posterior cerebral artery.  IMPRESSION: Exam limited by motion with suggestion of intracranial atherosclerotic type changes as detailed above.  Original Report Authenticated By: Fuller Canada, M.D.   Mr Mra Head/brain Wo Cm  02/12/2011  *RADIOLOGY REPORT*  Clinical Data:  Receptive aphasia.   Right-sided weakness.  MRI HEAD WITHOUT CONTRAST MRA HEAD WITHOUT CONTRAST  Technique: Multiplanar, multiecho pulse sequences of the brain and surrounding structures were obtained according to standard protocol without intravenous contrast.  Angiographic images of the head were obtained using MRA technique without contrast.  Comparison: 02/11/2011 head CT.  01/23/2010 brain MR.  MRI HEAD  Findings:  Motion degraded exam.  Previously noted large left hemispheric infarct has progressed as expected now with a broad area of encephalomalacia involving portion of the left temporal lobe, left periopercular region and left parietal lobe with  mild dilation of the left lateral ventricle.  Questionable tiny acute infarct medial aspect of the right thalamus (series 4 image 13).  Remote right parietal lobe small infarct.  Mild small vessel disease type changes.  Global atrophy without hydrocephalus.  No intracranial mass lesion detected on this unenhanced motion degraded exam.  Evaluation of the cervical medullary junction and upper cervical spine is limited by motion.  Suggestion of mild spinal stenosis upper cervical spine.  IMPRESSION: Motion degraded exam.   Previously noted large left hemispheric infarct has progressed as expected now with a broad area of encephalomalacia involving portion of the left temporal lobe, left periopercular region and left parietal lobe with mild dilation of the left lateral ventricle.  Questionable tiny acute infarct medial aspect of the right thalamus (series 4 image 13).  Remote right parietal lobe small infarct.  Mild small vessel disease type changes.  MRA HEAD  Findings: Motion degraded exam.  Secondary to the motion, grading stenosis accurately or evaluating for a small aneurysm is limited.  Findings raise possibility of stenosis involving; portion of the cavernous and supraclinoid aspect of the internal carotid artery bilaterally, proximal M1 segment of the right middle cerebral artery and A1/A2  segment of the left anterior cerebral artery.  Decreased number of visualized left middle cerebral artery branches consistent with the patient's remote left hemispheric infarct.  Markedly limited evaluation suggests stenosis of the distal vertebral arteries and proximal basilar artery.  Poor delineation of the PICAs and AICAs.  Moderate stenosis superior cerebellar artery bilaterally more notable on the left.  Moderate stenosis proximal left posterior cerebral artery.  IMPRESSION: Exam limited by motion with suggestion of intracranial atherosclerotic type changes as detailed above.  Original Report Authenticated By: Fuller Canada, M.D.    Medications: Scheduled Meds:   . amLODipine  10 mg Oral Daily  . aspirin  325 mg Oral Daily  . carvedilol  12.5 mg Oral BID WC  . enoxaparin  40 mg Subcutaneous Q24H  . folic acid  1 mg Oral Daily  . furosemide  20 mg Oral Daily  . insulin aspart  0-15 Units Subcutaneous TID WC  . olmesartan  40 mg Oral Daily  . banana bag IV fluid 1000 mL   Intravenous Q24H  . spironolactone  25 mg Oral Daily  . DISCONTD: hydrochlorothiazide  25 mg Oral Daily  . DISCONTD: hydrochlorothiazide  25 mg Oral  Daily   Continuous Infusions:   . sodium chloride Stopped (02/14/11 0153)  . niCARDipine Stopped (02/13/11 1015)   PRN Meds:.sodium chloride, acetaminophen, labetalol, senna-docusate  Assessment/Plan:  56 y/o presenting to ER on 1/2 for aphasia. BP was 212/122. He was admitted to ICU and started on Nicardipine. He was transitioned off on 1/4 and transferred to the stepdown unit.    *Hypertensive emergency- Nicardipine drip stopped. Medications adjusted by Cardiology. BP currently stable.  If it remains stable over the night, he can go home tomorrow AM.    Diabetes mellitus?- A1c is 5.9. His sugars have been controlled. Will stop accuchecks.    Cardiomyopathy- EF 30-25% w/ Grade 1 Diastolic dysf and left atrial dilatation.    Aphasia and right hemiparesis- He has had a prior left sided CVA. Per Dr Pearlean Brownie, this appears to be a new infarct vs worsening of old CVA.  - Daily ASA 325 is recommended - TEE is negative.  -Carotid dopplers negative for significant stenosis.  - HHPT/OT/SLP recommended- will discuss w/ case manager.   LOS: 3 days  [facility] [phone] 02/14/2011, 2:16 PM

## 2011-02-14 NOTE — Progress Notes (Signed)
CARE MANAGEMENT NOTE 02/14/2011  Patient:  Jeremy Howell, Jeremy Howell   Account Number:  [id number]  Date Initiated:  02/12/2011  Documentation initiated by:  [facility]  Subjective/Objective Assessment:   Admitted with hypertensive crisis.     Action/Plan:   Anticipated DC Date:  02/16/2011   Anticipated DC Plan:  HOME W HOME HEALTH SERVICES      DC Planning Services  CM consult      Choice offered to / List presented to:             Status of service:  In process, will continue to follow Medicare Important Message given?   (If response is "NO", the following Medicare IM given date fields will be blank) Date Medicare IM given:   Date Additional Medicare IM given:    Discharge Disposition:    Per UR Regulation:  Reviewed for med. necessity/level of care/duration of stay  Comments:  02/14/2011 1700 Spoke to pt and states he lives at home with his wife. Gave permission to speak to wife. Contacted wife at [phone], and no answer. Will attempt call back to discuss HH needs. Isidoro Donning RN CCM Case Mgmt phone [phone]

## 2011-02-15 LAB — GLUCOSE, CAPILLARY: Glucose-Capillary: 87 mg/dL (ref 70–99)

## 2011-02-15 MED ORDER — AMLODIPINE BESYLATE 10 MG PO TABS: 10.0000 mg | ORAL_TABLET | Freq: Every day | ORAL | Status: DC

## 2011-02-15 MED ORDER — OLMESARTAN MEDOXOMIL 40 MG PO TABS: 40.0000 mg | ORAL_TABLET | Freq: Every day | ORAL | Status: DC

## 2011-02-15 MED ORDER — ASPIRIN 325 MG PO TABS: 325.0000 mg | ORAL_TABLET | Freq: Every day | ORAL | Status: DC

## 2011-02-15 NOTE — Progress Notes (Signed)
   CARE MANAGEMENT NOTE 02/15/2011  Patient:  Jeremy Howell, Jeremy Howell   Account Number:  [id number]  Date Initiated:  02/12/2011  Documentation initiated by:  [facility]  Subjective/Objective Assessment:   Admitted with hypertensive crisis.     Action/Plan:   lives at home mother, and sister, Steward Drone   Anticipated DC Date:  02/16/2011   Anticipated DC Plan:  HOME W HOME HEALTH SERVICES      DC Planning Services  CM consult      [facility] Choice  HOME HEALTH   Choice offered to / List presented to:  C-5 Sibling           Status of service:  In process, will continue to follow Medicare Important Message given?   (If response is "NO", the following Medicare IM given date fields will be blank) Date Medicare IM given:   Date Additional Medicare IM given:    Discharge Disposition:    Per UR Regulation:  Reviewed for med. necessity/level of care/duration of stay  Comments:  02/15/2011 1030 Contacted emergency contact, Glyn Ade. Rico Ala is his mother and she has dementia. Sister lives with both mother and pt. She is fine [facility] Will update EPIC. Sister, Anthony Tamburo will be first contact, [phone]. Isidoro Donning RN CCM Case Mgmt phone [phone]  02/14/2011 1700 Spoke to pt and states he lives at home with his wife. Gave permission to speak to wife. Contacted wife at [phone], and no answer. Will attempt call back to discuss HH needs. Isidoro Donning RN CCM Case Mgmt phone [phone]

## 2011-02-15 NOTE — Progress Notes (Signed)
CARE MANAGEMENT NOTE 02/15/2011  Patient:  Jeremy Howell, Jeremy Howell   Account Number:  [id number]  Date Initiated:  02/12/2011  Documentation initiated by:  [facility]  Subjective/Objective Assessment:   Admitted with hypertensive crisis.     Action/Plan:   lives at home mother, and sister, Jeremy Howell   Anticipated DC Date:  02/16/2011   Anticipated DC Plan:  HOME W HOME HEALTH SERVICES      DC Planning Services  CM consult      [facility] Choice  HOME HEALTH   Choice offered to / List presented to:  C-5 Sibling        HH arranged  HH-1 RN  HH-5 SPEECH THERAPY  HH-2 PT      [facility] agency  [facility]   Status of service:  In process, will continue to follow Medicare Important Message given?   (If response is "NO", the following Medicare IM given date fields will be blank) Date Medicare IM given:   Date Additional Medicare IM given:    Discharge Disposition:  HOME W HOME HEALTH SERVICES  Per UR Regulation:  Reviewed for med. necessity/level of care/duration of stay  Comments:  02/15/2011 1500 Contacted [facility] for [facility] services for scheduled d/c today. MD wants pt set up with HF clinic. CM will notify CHF CM on 02/16/2011 for appt with HF clinic. Isidoro Donning RN CCM Case Mgmt phone [phone]  02/15/2011 1030 Contacted emergency contact, Jeremy Howell. Rico Ala is his mother and she has dementia. Sister lives with both mother and pt. She is fine [facility] for [facility]. Will update EPIC. Sister, Jeremy Howell will be first contact, [phone]. Isidoro Donning RN CCM Case Mgmt phone [phone]  02/14/2011 1700 Spoke to pt and states he lives at home with his wife. Gave permission to speak to wife. Contacted wife at [phone], and no answer. Will attempt call back to discuss HH needs. Isidoro Donning RN CCM Case Mgmt phone [phone]

## 2011-02-15 NOTE — Progress Notes (Signed)
   Subjective: Awake, does not speak. Denies active chest pain by shaking his head.   Objective: Temp:  [97.7 F (36.5 C)-98.8 F (37.1 C)] 97.7 F (36.5 C) (01/06 0415) Pulse Rate:  [48-85] 66  (01/06 0756) Resp:  [12-17] 15  (01/06 0415) BP: (134-171)/(81-107) 152/99 mmHg (01/06 0415) SpO2:  [92 %-100 %] 97 % (01/06 0415) Weight:  [132 lb 11.5 oz (60.2 kg)] 132 lb 11.5 oz (60.2 kg) (01/06 0107)  I/O last 3 completed shifts: In: 3356.7 [P.O.:360; I.V.:2996.7] Out: 2750 [Urine:2750]  Exam -   General - NAD.  Lungs - Decreased but clear, nonlabored.  Cardiac - Regular rate and rhythm, no S3.  Extremities - No pitting.  Testing -   Lab Results  Component Value Date   WBC 4.3 02/13/2011   HGB 16.1 02/13/2011   HCT 46.5 02/13/2011   MCV 78.3 02/13/2011   PLT 376 02/13/2011    Lab Results  Component Value Date   CREATININE 1.03 02/14/2011   BUN 15 02/14/2011   NA 137 02/14/2011   K 3.5 02/14/2011   CL 102 02/14/2011   CO2 26 02/14/2011    Lab Results  Component Value Date   CKTOTAL 89 02/13/2011   CKMB 2.5 02/13/2011   TROPONINI <0.30 02/13/2011    ECG - Recent tracings noted, LVH with significant repolarization abnormalities that are chronic.   Current Medications    . amLODipine  10 mg Oral Daily  . aspirin  325 mg Oral Daily  . carvedilol  12.5 mg Oral BID WC  . enoxaparin  40 mg Subcutaneous Q24H  . folic acid  1 mg Oral Daily  . furosemide  20 mg Oral Daily  . olmesartan  40 mg Oral Daily  . spironolactone  25 mg Oral Daily  . DISCONTD: insulin aspart  0-15 Units Subcutaneous TID WC  . DISCONTD: banana bag IV fluid 1000 mL   Intravenous Q24H    Assessment:  1. Hypertensive urgency, blood pressure trend improving following medication adjustments.  2. Cardiomyopathy, LVEF 30-35%. Patient has had some chest pain although cardiac markers are normal. His ECG is most consistent with LVH and significant repolarization abnormalities which are old.  3. Aphasia felt by  neurology secondary to worsening of old deficits of prior large brain infarct of hypertensive emergency - he has baseline aphasia.   Plan:  Continue present cardiac regimen for now. If discharged, please arrange to have the patient seen in the CHF clinic for ongoing evaluation and further medication adjustments.  Jonelle Sidle, M.D., F.A.C.C.

## 2011-03-17 ENCOUNTER — Other Ambulatory Visit: Payer: Self-pay

## 2011-03-17 ENCOUNTER — Emergency Department (HOSPITAL_COMMUNITY)
Admission: EM | Admit: 2011-03-17 | Discharge: 2011-03-17 | Disposition: A | Discharge status: Home / Self Care | Payer: Medicare Other | Attending: Emergency Medicine | Admitting: Emergency Medicine

## 2011-03-17 ENCOUNTER — Encounter (HOSPITAL_COMMUNITY): Payer: Self-pay | Admitting: Emergency Medicine

## 2011-03-17 DIAGNOSIS — I10 Essential (primary) hypertension: Secondary | ICD-10-CM | POA: Insufficient documentation

## 2011-03-17 DIAGNOSIS — R4701 Aphasia: Secondary | ICD-10-CM | POA: Insufficient documentation

## 2011-03-17 DIAGNOSIS — E119 Type 2 diabetes mellitus without complications: Secondary | ICD-10-CM | POA: Insufficient documentation

## 2011-03-17 DIAGNOSIS — Z8673 Personal history of transient ischemic attack (TIA), and cerebral infarction without residual deficits: Secondary | ICD-10-CM | POA: Insufficient documentation

## 2011-03-17 LAB — GLUCOSE, CAPILLARY: Glucose-Capillary: 92 mg/dL (ref 70–99)

## 2011-03-17 MED ORDER — CLONIDINE HCL 0.1 MG PO TABS
0.1000 mg | ORAL_TABLET | Freq: Once | ORAL | Status: AC
  Administered 2011-03-17: 0.1 mg via ORAL
  Filled 2011-03-17: qty 1

## 2011-03-17 MED ORDER — CARVEDILOL 25 MG PO TABS: 25.0000 mg | ORAL_TABLET | Freq: Two times a day (BID) | ORAL | Status: DC

## 2011-03-17 MED ORDER — CARVEDILOL 25 MG PO TABS
25.0000 mg | ORAL_TABLET | Freq: Two times a day (BID) | ORAL | Status: DC
  Administered 2011-03-17: 25 mg via ORAL
  Filled 2011-03-17: qty 1

## 2011-03-17 MED ORDER — CLONIDINE HCL 0.2 MG PO TABS: ORAL_TABLET | ORAL | Status: DC

## 2011-03-17 NOTE — ED Notes (Signed)
[facility] called to come pick up pt.  

## 2011-03-17 NOTE — ED Notes (Signed)
Pt has expressive aphasia has missed several MDs appt

## 2011-03-17 NOTE — ED Notes (Signed)
Pt is coming from home he lives with sister and has been having hypertension. Has scheduled 2 appoints with his pcp and hhas not been able to get there to see him. Pt has a hx of htn, cva and per family this is baseline confused at times and weakness to rt side of body. Family states that he has been taking his medications,

## 2011-03-17 NOTE — ED Provider Notes (Signed)
History     CSN: [id number]  Arrival date & time 03/17/11  1522   First MD Initiated Contact with Patient 03/17/11 1529      Chief Complaint  Patient presents with  . Hypertension    (Consider location/radiation/quality/duration/timing/severity/associated sxs/prior treatment) HPI  Patient with hx of  HTN, DM, prior L MCA stroke was seen in ER and admitted to hospital on February 12, 2010 for new onset aphasia of uncertain time of onset who found to be profoundly hypertensive with question of hypertensive emergency vs. new ischaemic stroke with dc from hospital with improved BP but ongoing aphasia secondary to ? Stroke with instructions to follow up with PCP and CHF clinic but presents to ER with concern of elevated BP and inability to follow up with PCP or CHF clinic due to transportation issues. Patient has difficulty speaking and therefore writes down contact : Ben at [phone] to obtain remainder of hx. Per Romeo Apple, Patient has no complaints but has been noticing increasing BP once again and has not been able to follow up but that social worker as been working on transportation issues. Systolic BP has been as high as 200. Patient is able to understand questioning and shake head "no" when asked ROS. He denies CP, SOB, abdominal pain, HA, dizziness, n/v.  Patient states "yes" when asked if taking BP medications. There are no identified aggravating or alleviating factors.      Past Medical History  Diagnosis Date  . Cardiomyopathy     EF 20-25% by echo 01/2010 with severe LVH felt secondary to substance abuse (no ischemic workup)  . 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 Diabetes mellitus   . HTN (hypertension)   . Polysubstance abuse     Reported hx of cocaine, THC, heroin, EtOH abuse  . Noncompliance     History reviewed. No pertinent past surgical history.  No family history on file.  History  Substance Use Topics    . Smoking status: Current Everyday Smoker    Types: Cigarettes  . Smokeless tobacco: Not on file   Comment: declined  . Alcohol Use: Yes      Review of Systems  All other systems reviewed and are negative.    Allergies  Almond oil  Home Medications   Current Outpatient Rx  Name Route Sig Dispense Refill  . ASPIRIN 325 MG PO TABS Oral Take 1 tablet (325 mg total) by mouth daily.    Marland Kitchen CARVEDILOL 12.5 MG PO TABS Oral Take 12.5 mg by mouth 2 (two) times daily with a meal.      . FOLIC ACID 1 MG PO TABS Oral Take 1 mg by mouth daily.      . FUROSEMIDE 20 MG PO TABS Oral Take 20 mg by mouth daily.      Marland Kitchen OLMESARTAN MEDOXOMIL 40 MG PO TABS Oral Take 1 tablet (40 mg total) by mouth daily. 30 tablet 0    BP 184/98  Pulse 69  Temp(Src) 98.5 F (36.9 C) (Oral)  Resp 18  SpO2 99%  Physical Exam  Nursing note and vitals reviewed. Constitutional: He is oriented to person, place, and time. He appears well-developed and well-nourished. No distress.  HENT:  Head: Normocephalic and atraumatic.  Eyes: Conjunctivae are normal.  Neck: Normal range of motion. Neck supple.  Cardiovascular: Normal rate, regular rhythm, normal heart sounds and intact distal pulses.  Exam reveals no  gallop and no friction rub.   No murmur heard. Pulmonary/Chest: Effort normal and breath sounds normal. No respiratory distress. He has no wheezes. He has no rales. He exhibits no tenderness.  Abdominal: Soft. Bowel sounds are normal. He exhibits no distension and no mass. There is no tenderness. There is no rebound and no guarding.  Musculoskeletal: Normal range of motion. He exhibits no edema and no tenderness.       4/5 strength of RUE compared to 5/5 left.   Neurological: He is alert and oriented to person, place, and time.       Aphasic slur but remaining cranial nerves grossly intact.  Skin: Skin is warm and dry. No rash noted. He is not diaphoretic. No erythema.  Psychiatric: He has a normal mood and  affect.    ED Course  Procedures (including critical care time)  I spoke with Dr. Concepcion Elk who confirmed that patient has missed the last 3 appointments but rescheduled him an appointment for this Friday at 9:15 and requests that carvedilol is increased to 25 mg BID and to add clonidine 0.1mg  TID.   Labs Reviewed  GLUCOSE, CAPILLARY   No results found.   1. Hypertension   2. Aphasia       MDM  Patient has no physical complaint with baseline unchanged aphasia but denies symptoms with HTN. I spoke with PCP who will see patient in 3 days and gave recommendations for BP medication changes. Patient voices his understanding to treatment plan and is agreeable.         Jenness Corner, [place] 03/17/11 1821

## 2011-03-17 NOTE — Discharge Instructions (Signed)
It is VERY important to follow up with Dr. Concepcion Elk in his office at 9:15 on Friday morning. Your carvedilol has been increased to 25mg  twice a day and start a new medication, clonidine 0.1 mg three times a day. Return to ER for emergent changing or worsening of symptoms.   How to Take Your Blood Pressure  These instructions are only for electronic home blood pressure machines. You will need:   An automatic or semi-automatic blood pressure machine.   Fresh batteries for the blood pressure machine.  HOW DO I USE THESE TOOLS TO CHECK MY BLOOD PRESSURE?   There are 2 numbers that make up your blood pressure. For example: 120/80.   The first number (120 in our example) is called the "systolic pressure." It is a measure of the pressure in your blood vessels when your heart is pumping blood.   The second number (80 in our example) is called the "diastolic pressure." It is a measure of the pressure in your blood vessels when your heart is resting between beats.   Before you buy a home blood pressure machine, check the size of your arm so you can buy the right size cuff. Here is how to check the size of your arm:   Use a tape measure that shows both inches and centimeters.   Wrap the tape measure around the middle upper part of your arm. You may need someone to help you measure right.   Write down your arm measurement in both inches and centimeters.   To measure your blood pressure right, it is important to have the right size cuff.   If your arm is up to 13 inches (37 to 34 centimeters), get an adult cuff size.   If your arm is 13 to 17 inches (35 to 44 centimeters), get a large adult cuff size.   If your arm is 17 to 20 inches (45 to 52 centimeters), get an adult thigh cuff.   Try to rest or relax for at least 30 minutes before you check your blood pressure.   Do not smoke.   Do not have any drinks with caffeine, such as:   Pop.   Coffee.   Tea.   Check your blood pressure in a  quiet room.   Sit down and stretch out your arm on a table. Keep your arm at about the level of your heart. Let your arm relax.  GETTING BLOOD PRESSURE READINGS  Make sure you remove any tight-fighting clothing from your arm. Wrap the cuff around your upper arm. Wrap it just above the bend, and above where you felt the pulse. You should be able to slip a finger between the cuff and your arm. If you cannot slip a finger in the cuff, it is too tight and should be removed and rewrapped.   Some units requires you to manually pump up the arm cuff.   Automatic units inflate the cuff when you press a button.   Cuff deflation is automatic in both models.   After the cuff is inflated, the unit measures your blood pressure and pulse. The readings are displayed on a monitor. Hold still and breathe normally while the cuff is inflated.   Getting a reading takes less than a minute.   Some models store readings in a memory. Some provide a printout of readings.   Get readings at different times of the day. You should wait at least 5 minutes between readings. Take readings with you to  your next doctor's visit.  Document Released: 01/09/2008 Document Revised: 10/09/2010 Document Reviewed: 01/09/2008 [facility] Patient Information 2012 [place], [place].  Hypertension Information As your heart beats, it forces blood through your arteries. This force is your blood pressure. If the pressure is too high, it is called hypertension (HTN) or high blood pressure. HTN is dangerous because you may have it and not know it. High blood pressure may mean that your heart has to work harder to pump blood. Your arteries may be narrow or stiff. The extra work puts you at risk for heart disease, stroke, and other problems.  Blood pressure consists of two numbers, a higher number over a lower, 110/72, for example. It is stated as "110 over 72." The ideal is below 120 for the top number (systolic) and under 80 for the bottom  (diastolic).  You should pay close attention to your blood pressure if you have certain conditions such as:  Heart failure.   Prior heart attack.   Diabetes   Chronic kidney disease.   Prior stroke.   Multiple risk factors for heart disease.  To see if you have HTN, your blood pressure should be measured while you are seated with your arm held at the level of the heart. It should be measured at least twice. A one-time elevated blood pressure reading (especially in the Emergency Department) does not mean that you need treatment. There may be conditions in which the blood pressure is different between your right and left arms. It is important to see your caregiver soon for a recheck. Most people have essential hypertension which means that there is not a specific cause. This type of high blood pressure may be lowered by changing lifestyle factors such as:  Stress.   Smoking.   Lack of exercise.   Excessive weight.   Drug/tobacco/alcohol use.   Eating less salt.  Most people do not have symptoms from high blood pressure until it has caused damage to the body. Effective treatment can often prevent, delay or reduce that damage. TREATMENT  Treatment for high blood pressure, when a cause has been identified, is directed at the cause. There are a large number of medications to treat HTN. These fall into several categories, and your caregiver will help you select the medicines that are best for you. Medications may have side effects. You should review side effects with your caregiver. If your blood pressure stays high after you have made lifestyle changes or started on medicines,   Your medication(s) may need to be changed.   Other problems may need to be addressed.   Be certain you understand your prescriptions, and know how and when to take your medicine.   Be sure to follow up with your caregiver within the time frame advised (usually within two weeks) to have your blood pressure  rechecked and to review your medications.   If you are taking more than one medicine to lower your blood pressure, make sure you know how and at what times they should be taken. Taking two medicines at the same time can result in blood pressure that is too low.  Document Released: 03/31/2005 Document Revised: 10/08/2010 Document Reviewed: 04/07/2007 [facility] Patient Information 2012 [place], [place].

## 2011-03-17 NOTE — ED Notes (Signed)
EAV:[id number]> Expected date:03/17/11<BR> Expected time: 3:15 PM<BR> Means of arrival:Ambulance<BR> Comments:<BR> EMS 41 GC, 55 yom/ increased BP 148/p

## 2011-03-19 NOTE — ED Provider Notes (Signed)
Medical screening examination/treatment/procedure(s) were conducted as a shared visit with non-physician practitioner(s) and myself.  I personally evaluated the patient during the encounter Pt w htn, no new neuro findings on exam. No headache.   Suzi Roots, MD 03/19/11 [phone]

## 2011-04-01 NOTE — Discharge Summary (Signed)
DISCHARGE SUMMARY  Jeremy Howell  MR#: [id number]  DOB:1955-09-15  Date of Admission: 02/11/2011 Date of Discharge: 04/01/2011  Attending Physician:Waylen Depaolo  Patient's WUJ:WJXBJYN,WGNFA A, MD, MD  Consults:Treatment Team:  [name], MD Darcella Cheshire- Neurology [facility]   Discharge Diagnoses: Principal Problem:  *Hypertensive emergency Active Problems:  Diabetes mellitus  Cardiomyopathy  h/o CVA    Discharge Medications: Medication List  As of 04/01/2011  9:41 PM   STOP taking these medications         cloNIDine 0.3 mg/24hr      hydrochlorothiazide 25 MG tablet      TRIBENZOR 40-10-25 MG Tabs         TAKE these medications         aspirin 325 MG tablet   Take 1 tablet (325 mg total) by mouth daily.      carvedilol 12.5 MG tablet   Commonly known as: COREG   Take 12.5 mg by mouth 2 (two) times daily with a meal.      folic acid 1 MG tablet   Commonly known as: FOLVITE   Take 1 mg by mouth daily.      furosemide 20 MG tablet   Commonly known as: LASIX   Take 20 mg by mouth daily.      olmesartan 40 MG tablet   Commonly known as: BENICAR   Take 1 tablet (40 mg total) by mouth daily.            Ct Head Wo Contrast  02/11/2011 *RADIOLOGY REPORT* Clinical Data: Confusion with altered mental status and memory loss. CT HEAD WITHOUT CONTRAST Technique: Contiguous axial images were obtained from the base of the skull through the vertex without contrast. Comparison: 01/28/2010 and 01/28/2010 head CTs. Findings: There is a large area of encephalomalacia within the left temporal and frontal lobes related to the previously demonstrated infarct. This demonstrates expected evolution without definite extension. There is no evidence of acute intracranial hemorrhage, mass lesion, brain edema or extra-axial fluid collection. There is mild ventriculomegaly. No midline shift is demonstrated. The visualized paranasal sinuses remain clear. The calvarium  is intact. IMPRESSION: 1. No acute intracranial findings identified. 2. Left temporal frontal encephalomalacia related to previously demonstrated infarct. Original Report Authenticated By: Gerrianne Scale, M.D.   MRI HEAD  Findings: Motion degraded exam.  Previously noted large left hemispheric infarct has progressed as  expected now with a broad area of encephalomalacia involving  portion of the left temporal lobe, left periopercular region and  left parietal lobe with mild dilation of the left lateral  ventricle.  Questionable tiny acute infarct medial aspect of the right thalamus  (series 4 image 13).  Remote right parietal lobe small infarct.  Mild small vessel disease type changes.  Global atrophy without hydrocephalus.  No intracranial mass lesion detected on this unenhanced motion  degraded exam.  Evaluation of the cervical medullary junction and upper cervical  spine is limited by motion. Suggestion of mild spinal stenosis  upper cervical spine.  IMPRESSION:  Motion degraded exam.  Previously noted large left hemispheric infarct has progressed as  expected now with a broad area of encephalomalacia involving  portion of the left temporal lobe, left periopercular region and  left parietal lobe with mild dilation of the left lateral  ventricle.  Questionable tiny acute infarct medial aspect of the right thalamus  (series 4 image 13).  Remote right parietal lobe small infarct.  Mild small vessel disease type changes.   MRA HEAD  Findings: Motion degraded exam.  Secondary to the motion, grading stenosis accurately or evaluating  for a small aneurysm is limited.  Findings raise possibility of stenosis involving; portion of the  cavernous and supraclinoid aspect of the internal carotid artery  bilaterally, proximal M1 segment of the right middle cerebral  artery and A1/A2 segment of the left anterior cerebral artery.  Decreased number of visualized left middle cerebral  artery branches  consistent with the patient's remote left hemispheric infarct.  Markedly limited evaluation suggests stenosis of the distal  vertebral arteries and proximal basilar artery. Poor delineation  of the PICAs and AICAs.  Moderate stenosis superior cerebellar artery bilaterally more  notable on the left.  Moderate stenosis proximal left posterior cerebral artery.  IMPRESSION:  Exam limited by motion with suggestion of intracranial  atherosclerotic type changes as detailed above.     Hospital Course: Principal Problem:  *Hypertensive emergency Jeremy Howell is a 56 y.o. male with a h/o left MCA ischemic infarct in the past patient presented with aphasia and right arm weakness, clumsiness in the setting of significantly elevated blood pressure.Upon arrival he was noted to have an elevated BP and was started on a Nicardipine drip by PCCM.  A neurology consult was requested and it was concluded that the patient had not had a new infarct. His prior stroke symptoms were thought to have been exacerbated due to uncontrolled/ malignant HTN. As he BP came under control, the patient came back to baseline.  He has had his medications adjusted by cardiology and BP is now controlled. He is advised to cont a 325mg  Aspirin by the Neurologist.    Day of Discharge Physical Exam: BP 152/99  Pulse 66  Temp(Src) 97.7 F (36.5 C) (Oral)  Resp 15  Ht 6' (1.829 m)  Wt 60.2 kg (132 lb 11.5 oz)  BMI 18.00 kg/m2  SpO2 97% General appearance: alert, no distress and uncooperative  Lungs: clear to auscultation bilaterally  Heart: regular rate and rhythm, S1, S2 normal, no murmur, click, rub or gallop  Abdomen: soft, non-tender; bowel sounds normal; no masses, no organomegaly  Extremities: extremities normal, atraumatic, no cyanosis or edema   No results found for this or any previous visit (from the past 24 hour(s)).  Disposition: stable for discharge   Follow-up Appts: Discharge Orders      Future Orders Please Complete By Expires   Diet - low sodium heart healthy      Increase activity slowly      Discharge instructions      Comments:   Please follow up with the CHF clinic.   Home Health      Questions: Responses:   To provide the following care/treatments PT    SLP    RN   Face-to-face encounter      Comments:   I [facility] certify that this patient is under my care and that I, or a nurse practitioner or physician's assistant working with me, had a face-to-face encounter that meets the physician face-to-face encounter requirements with this patient on 02/15/2011.       Questions: Responses:   The encounter with the patient was in whole, or in part, for the following medical condition, which is the primary reason for home health care CVA   I certify that, based on my findings, the following services are medically necessary home health services Nursing    Physical therapy    Speech language pathology   My clinical findings support the need for the  above services Complex treatment plan/patient with lack knowledge disease process and treatment    High Risk for rehospitalization   Further, I certify that my clinical findings support that this patient is homebound (i.e. absences from home require considerable and taxing effort and are for medical reasons or religious services or infrequently or of short duration when for other reasons) Unable to leave home safely without assistance   To provide the following care/treatments PT    SLP    RN      Time on Discharge: >58min  Signed: Twan Harkin 04/01/2011, 9:41 PM

## 2011-07-21 ENCOUNTER — Emergency Department (HOSPITAL_COMMUNITY): Payer: Medicare Other

## 2011-07-21 ENCOUNTER — Emergency Department (HOSPITAL_COMMUNITY)
Admission: EM | Admit: 2011-07-21 | Discharge: 2011-07-24 | Disposition: A | Discharge status: Home / Self Care | Payer: Medicare Other | Attending: Emergency Medicine | Admitting: Emergency Medicine

## 2011-07-21 ENCOUNTER — Encounter (HOSPITAL_COMMUNITY): Payer: Self-pay | Admitting: *Deleted

## 2011-07-21 DIAGNOSIS — R454 Irritability and anger: Secondary | ICD-10-CM | POA: Insufficient documentation

## 2011-07-21 DIAGNOSIS — R29898 Other symptoms and signs involving the musculoskeletal system: Secondary | ICD-10-CM | POA: Insufficient documentation

## 2011-07-21 DIAGNOSIS — Z79899 Other long term (current) drug therapy: Secondary | ICD-10-CM | POA: Insufficient documentation

## 2011-07-21 DIAGNOSIS — E119 Type 2 diabetes mellitus without complications: Secondary | ICD-10-CM | POA: Insufficient documentation

## 2011-07-21 DIAGNOSIS — I69920 Aphasia following unspecified cerebrovascular disease: Secondary | ICD-10-CM | POA: Insufficient documentation

## 2011-07-21 DIAGNOSIS — R4182 Altered mental status, unspecified: Secondary | ICD-10-CM | POA: Insufficient documentation

## 2011-07-21 DIAGNOSIS — G9389 Other specified disorders of brain: Secondary | ICD-10-CM | POA: Insufficient documentation

## 2011-07-21 DIAGNOSIS — I10 Essential (primary) hypertension: Secondary | ICD-10-CM | POA: Insufficient documentation

## 2011-07-21 LAB — COMPREHENSIVE METABOLIC PANEL
ALT: 10 U/L (ref 0–53)
AST: 14 U/L (ref 0–37)
Alkaline Phosphatase: 78 U/L (ref 39–117)
Calcium: 9.4 mg/dL (ref 8.4–10.5)
Glucose, Bld: 95 mg/dL (ref 70–99)
Potassium: 3.3 mEq/L — ABNORMAL LOW (ref 3.5–5.1)
Sodium: 138 mEq/L (ref 135–145)
Total Protein: 7.4 g/dL (ref 6.0–8.3)

## 2011-07-21 LAB — RAPID URINE DRUG SCREEN, HOSP PERFORMED
Amphetamines: NOT DETECTED
Barbiturates: NOT DETECTED
Benzodiazepines: NOT DETECTED
Cocaine: NOT DETECTED
Tetrahydrocannabinol: NOT DETECTED

## 2011-07-21 LAB — CBC
Hemoglobin: 14.1 g/dL (ref 13.0–17.0)
MCH: 27.1 pg (ref 26.0–34.0)
MCHC: 34 g/dL (ref 30.0–36.0)
Platelets: 309 10*3/uL (ref 150–400)
RBC: 5.2 MIL/uL (ref 4.22–5.81)

## 2011-07-21 MED ORDER — ASPIRIN 325 MG PO TABS
325.0000 mg | ORAL_TABLET | Freq: Every day | ORAL | Status: DC
  Administered 2011-07-21 – 2011-07-24 (×4): 325 mg via ORAL
  Filled 2011-07-21 (×4): qty 1

## 2011-07-21 MED ORDER — CLONIDINE HCL 0.1 MG PO TABS: 0.1000 mg | ORAL_TABLET | Freq: Once | ORAL | Status: DC

## 2011-07-21 MED ORDER — CLONIDINE HCL 0.1 MG PO TABS
0.2000 mg | ORAL_TABLET | Freq: Two times a day (BID) | ORAL | Status: DC
  Administered 2011-07-21 – 2011-07-24 (×7): 0.2 mg via ORAL
  Filled 2011-07-21: qty 2
  Filled 2011-07-21: qty 1
  Filled 2011-07-21 (×5): qty 2
  Filled 2011-07-21: qty 1

## 2011-07-21 MED ORDER — FUROSEMIDE 20 MG PO TABS
20.0000 mg | ORAL_TABLET | Freq: Every day | ORAL | Status: DC
  Administered 2011-07-21 – 2011-07-24 (×4): 20 mg via ORAL
  Filled 2011-07-21 (×5): qty 1

## 2011-07-21 MED ORDER — CLONIDINE HCL 0.1 MG PO TABS
0.2000 mg | ORAL_TABLET | Freq: Once | ORAL | Status: AC
  Administered 2011-07-21: 0.2 mg via ORAL
  Filled 2011-07-21: qty 2

## 2011-07-21 MED ORDER — HYDRALAZINE HCL 20 MG/ML IJ SOLN
20.0000 mg | Freq: Once | INTRAMUSCULAR | Status: AC
  Administered 2011-07-21: 20 mg via INTRAVENOUS
  Filled 2011-07-21: qty 1

## 2011-07-21 MED ORDER — FOLIC ACID 1 MG PO TABS
1.0000 mg | ORAL_TABLET | Freq: Every day | ORAL | Status: DC
  Administered 2011-07-21 – 2011-07-24 (×4): 1 mg via ORAL
  Filled 2011-07-21 (×4): qty 1

## 2011-07-21 MED ORDER — IRBESARTAN 300 MG PO TABS
300.0000 mg | ORAL_TABLET | Freq: Every day | ORAL | Status: DC
  Administered 2011-07-21 – 2011-07-24 (×4): 300 mg via ORAL
  Filled 2011-07-21 (×6): qty 1

## 2011-07-21 NOTE — ED Notes (Signed)
Belongings bags x 2 placed in locker #27.  Labeled cane is at the nurse station b/c it wont fit in the locker.

## 2011-07-21 NOTE — ED Notes (Signed)
AOZ:[id number]<VH> Expected date:07/20/11<BR> Expected time: 9:25 AM<BR> Means of arrival:<BR> Comments:<BR> hold

## 2011-07-21 NOTE — ED Notes (Addendum)
Per Effie Shy, EDP, ACT team has reviewed pt's chart and feels that he is not an appropriate patient for them at this time. Awaiting Dr. Kassie Mends to assess pt for further plan of care. Pt will stay in acute ED due to being unsteady on feet without cane, not appropriate for Psych ED.

## 2011-07-21 NOTE — ED Provider Notes (Addendum)
The patient is having issues in his current living setting. He is psychiatrically unstable for placement into a different living setting. The patient will be evaluated today by the in-house psychiatrist. At that point a disposition plan can be determined.  Flint Melter, MD 07/21/11 1018  Flint Melter, MD 07/23/11 [phone]

## 2011-07-21 NOTE — ED Notes (Signed)
Went in to assess pt and pt was leaning over in bed as if in pain. I asked where was he hurting and he pointed to the back. Felt around area and asked him about pain and then he pointed to leg. Sitter at bedside as him where he hurt and he pointed to his backside again. When asked if he had to use the bathroom pt responded yes. Sitter helped pt use bed pan. Will go back to assess.
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
## 2011-07-21 NOTE — ED Notes (Signed)
Pt answering questions in phrases; keeps saying "doesn't know about that boy" when asked why he is here; pt conversation confused; states name is Jeremy Howell; denies pain; no facial drooping noted; walking normal per pt;

## 2011-07-21 NOTE — ED Notes (Signed)
Pt. Alert, cooperative.  Unable to speak or make needs known very well.  Uses urinal without difficulty.  Sitter at bedside.  No violent behavior noted.

## 2011-07-21 NOTE — ED Notes (Signed)
Pt changed into scrubs; belongings including cane removed; pt wanded by security; D[place]en PA notified of pt elevated bp

## 2011-07-22 DIAGNOSIS — F39 Unspecified mood [affective] disorder: Secondary | ICD-10-CM

## 2011-07-22 NOTE — ED Notes (Signed)
Dr. Dierdre Highman notified of BP 172/93 and stated that we would re-check at 7:00 am.
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
## 2011-07-22 NOTE — ED Notes (Signed)
Personal belongings placed in bags and locked in cabinet at bedside (Rm5).

## 2011-07-22 NOTE — ED Notes (Signed)
CSW spoke with the pt regarding possible ALF placement. Pt has difficulty communicating, however is agreeable to ALF placement if unable to return home. Pt wrote down that he receives $950 monthly in disability and pays his sister Steward Drone $400 per month in rent to stay in the home. Pt expressed concern that his wallet is at his home along with all his belongings. Pt states he will need to have these items before going to an ALF.   ED CSW's will attempt to place pt in an ALF. Pt is currently pending placement.

## 2011-07-22 NOTE — ED Provider Notes (Signed)
8:46 AM Watching TV. Calm and cooperative. No complaints this AM  Filed Vitals:   07/22/11 0708  BP: 166/101  Pulse:   Temp:   Resp:    Will await placement  Lyanne Co, MD 07/22/11 [phone]

## 2011-07-23 MED ORDER — LORAZEPAM 1 MG PO TABS: 1.0000 mg | ORAL_TABLET | Freq: Once | ORAL | Status: DC

## 2011-07-23 MED ORDER — TUBERCULIN PPD 5 UNIT/0.1ML ID SOLN
5.0000 [IU] | Freq: Once | INTRADERMAL | Status: AC
  Administered 2011-07-23: 5 [IU] via INTRADERMAL
  Filled 2011-07-23 (×2): qty 0.1

## 2011-07-23 NOTE — ED Notes (Signed)
Patient ambulated in hall with me. He walked 11ft. Patient was independent with cane.

## 2011-07-23 NOTE — ED Notes (Signed)
[id number]<JY> Expected date:<BR> Expected time:<BR> Means of arrival:<BR> Comments:<BR> Closed

## 2011-07-23 NOTE — ED Notes (Signed)
Report received from [place], [place]

## 2011-07-23 NOTE — ED Notes (Signed)
Denies any harmful ideation towards anyone or self.

## 2011-07-23 NOTE — Progress Notes (Signed)
CSW contacted EDP and requested a PT/OT evaluation for placement. Also asked Charge Nurse to have TB test placed.  Manson Passey Shequila Neglia ANN S , MSW, LCSWA 07/23/2011 11:15 AM [phone]

## 2011-07-23 NOTE — Evaluation (Signed)
Physical Therapy Evaluation Patient Details Name: Jeremy Howell MRN: [id number] DOB: Jan 18, 1956 Today's Date: 07/23/2011 Time: 1308-6578 PT Time Calculation (min): 8 min  PT Assessment / Plan / Recommendation Clinical Impression  Pt seen in ED.  Pt doing well with mobility.  Pt able to ambulate 120 feet with straight cane modified independent level.  Pt presents with expressive difficulties but able to communicate with motions and yes/no questions.  Pt will not require f/u PT.    PT Assessment  Patent does not need any further PT services    Follow Up Recommendations  No PT follow up    Barriers to Discharge        lEquipment Recommendations  None recommended by PT    Recommendations for Other Services     Frequency      Precautions / Restrictions     Pertinent Vitals/Pain No pain      Mobility  Bed Mobility Bed Mobility: Sit to Supine Sit to Supine: 7: Independent Transfers Transfers: Sit to Stand;Stand to Sit Sit to Stand: 6: Modified independent (Device/Increase time) Stand to Sit: 6: Modified independent (Device/Increase time) Ambulation/Gait Ambulation/Gait Assistance: 6: Modified independent (Device/Increase time) Ambulation Distance (Feet): 120 Feet Assistive device: Straight cane Ambulation/Gait Assistance Details: pt does well with straight cane, reports yes to ambulation feeling normal Gait Pattern: Step-through pattern;Decreased stride length General Gait Details: LLD more obvious during gait, pt motioned to L heel and states yes to having heel lift in shoe    Exercises     PT Diagnosis:    PT Problem List:   PT Treatment Interventions:     PT Goals    Visit Information  Last PT Received On: 07/23/11 Assistance Needed: +1    Subjective Data  Subjective: "see here." (pointing to L heel and able to communicate heel lift for shoe due to LLD   Prior Functioning  Home Living Additional Comments: Please see SW notes.  SW trying to place pt in  ALF. Prior Function Level of Independence: Independent with assistive device(s) Communication Communication: Expressive difficulties    Cognition  Overall Cognitive Status: Appears within functional limits for tasks assessed/performed Arousal/Alertness: Awake/alert    Extremity/Trunk Assessment Right Upper Extremity Assessment RUE ROM/Strength/Tone: Deficits RUE ROM/Strength/Tone Deficits: Deficits since previous L MCA CVA Left Upper Extremity Assessment LUE ROM/Strength/Tone: WFL for tasks assessed Right Lower Extremity Assessment RLE ROM/Strength/Tone: Deficits RLE ROM/Strength/Tone Deficits: pt states "yes" to weakness since CVA in 2011 Left Lower Extremity Assessment LLE ROM/Strength/Tone: [facility] for tasks assessed   Balance    End of Session PT - End of Session Activity Tolerance: Patient tolerated treatment well Patient left: in bed;with call bell/phone within reach;Other (comment) (with sitter)   Jinx Gilden,KATHrine E 07/23/2011, 4:33 PM Pager: [phone]

## 2011-07-23 NOTE — ED Provider Notes (Signed)
BP 144/89  Pulse 51  Temp 97.9 F (36.6 C) (Oral)  Resp 18  SpO2 97% No new issues overnight. Recommended D/C by psychiatrist. SW working on placement.     Loren Racer, MD 07/23/11 [phone]

## 2011-07-23 NOTE — Progress Notes (Signed)
CSW has left multiple messages for patient's sisters with no return call back.  Patient is interested in a facility, however, due to the nature of this ED visit, he would have to obtain a level II PASARR which will require a face to face assessment.  CSW also requested PT/OT consult to assess patient's SNF need.  CSW's plan for patient is to discharge him home with meds to aid with anxiety, and home health who will be able to assist with placement if needed.  Patient pays $400 per month in rent, and per [place] law, patient must be given a 30 day notice.  Manson Passey Klaira Pesci ANN S , MSW, LCSWA 07/23/2011  12:15 PM [phone]

## 2011-07-23 NOTE — ED Notes (Signed)
CSW called pt's sister, Ayeden Gladman, multiple times today in order to continue discussing pt's need to discharge home. After multiple attempts with no success, CSW contacted [employer] at approximately 3:30pm to complete a welfare check to see if the pt's family was at home to receive the pt. CSW received a return call at 3:54pm from Officer Gaynell Face who was at the pt's home with his sister, Elease Hashimoto who is visiting from out of town, and pt's mother Rico Ala. Elease Hashimoto stated that the pt was not able to return home and told the police officer with the CSW on the phone, that the pt is a threat of danger to the family and that he had threatened them with hidden weapons they found in his bedroom. Elease Hashimoto also went on to deny that the pt had ever given them money monthly for rent, etc. Elease Hashimoto stated that I would need to talk with Steward Drone who was currently at work.    Approximately 4:00pm: CSW contacted LaQuisha Minor at [facility] DSS/APS to inquire as to whether the pt's case had been open and to discuss whether APS would provide assistance. Viviano Simas stated that there was no reason at this time to open an APS case, however added that if the pt was discharged home, DSS would provide "community outreach."  4:45pm: CSW contacted LaQuisha at DSS to inquire if a DSS staff member was able to meet the pt and EMS at home this evening if pt was discharged. Viviano Simas stated that this would not be possible.   5:15pm: CSW then spoke with APS Supervisor, Gerre Scull [phone] who stated that a member would not be available to come out this evening. However if pt remained in the ED overnight, APS would be able to meet the pt with [employer] at his home between 10:30am-11:00am on 07/24/11. Margo Aye stated that APS could talk with family about providing foster placement and assisting in ALF placement from home. Margo Aye shared that he or Viviano Simas would need to be contacted around 8:15am on 07/24/11 by the covering CSW, in order to coordinate discharge  plans and to discuss when [facility] would be transporting pt home. According to the off-duty [employer] officer, the non-emergency number [phone]) will need to be contacted once PT[facility] arrives on 07/24/11 to have an officer meet APS and the pt home.

## 2011-07-24 ENCOUNTER — Observation Stay (HOSPITAL_COMMUNITY)
Admission: EM | Admit: 2011-07-24 | Discharge: 2011-07-24 | Disposition: A | Discharge status: Home / Self Care | Payer: Medicare Other | Attending: Emergency Medicine | Admitting: Emergency Medicine

## 2011-07-24 ENCOUNTER — Encounter (HOSPITAL_COMMUNITY): Payer: Self-pay | Admitting: Emergency Medicine

## 2011-07-24 DIAGNOSIS — Z79899 Other long term (current) drug therapy: Secondary | ICD-10-CM | POA: Insufficient documentation

## 2011-07-24 DIAGNOSIS — Z7982 Long term (current) use of aspirin: Secondary | ICD-10-CM | POA: Insufficient documentation

## 2011-07-24 DIAGNOSIS — Z91199 Patient's noncompliance with other medical treatment and regimen due to unspecified reason: Secondary | ICD-10-CM | POA: Insufficient documentation

## 2011-07-24 DIAGNOSIS — I428 Other cardiomyopathies: Secondary | ICD-10-CM | POA: Insufficient documentation

## 2011-07-24 DIAGNOSIS — I10 Essential (primary) hypertension: Secondary | ICD-10-CM | POA: Insufficient documentation

## 2011-07-24 DIAGNOSIS — IMO0002 Reserved for concepts with insufficient information to code with codable children: Secondary | ICD-10-CM | POA: Insufficient documentation

## 2011-07-24 DIAGNOSIS — F17200 Nicotine dependence, unspecified, uncomplicated: Secondary | ICD-10-CM | POA: Insufficient documentation

## 2011-07-24 DIAGNOSIS — E119 Type 2 diabetes mellitus without complications: Secondary | ICD-10-CM | POA: Insufficient documentation

## 2011-07-24 DIAGNOSIS — T7401XA Adult neglect or abandonment, confirmed, initial encounter: Principal | ICD-10-CM | POA: Insufficient documentation

## 2011-07-24 DIAGNOSIS — Z8673 Personal history of transient ischemic attack (TIA), and cerebral infarction without residual deficits: Secondary | ICD-10-CM | POA: Insufficient documentation

## 2011-07-24 DIAGNOSIS — Z9119 Patient's noncompliance with other medical treatment and regimen: Secondary | ICD-10-CM | POA: Insufficient documentation

## 2011-07-24 LAB — BASIC METABOLIC PANEL
CO2: 25 mEq/L (ref 19–32)
Calcium: 10.1 mg/dL (ref 8.4–10.5)
Creatinine, Ser: 1.21 mg/dL (ref 0.50–1.35)
Glucose, Bld: 95 mg/dL (ref 70–99)

## 2011-07-24 LAB — DIFFERENTIAL
Basophils Absolute: 0 10*3/uL (ref 0.0–0.1)
Eosinophils Relative: 3 % (ref 0–5)
Lymphocytes Relative: 38 % (ref 12–46)
Monocytes Absolute: 0.3 10*3/uL (ref 0.1–1.0)

## 2011-07-24 LAB — CBC
HCT: 50.3 % (ref 39.0–52.0)
MCH: 26.9 pg (ref 26.0–34.0)
MCV: 80.6 fL (ref 78.0–100.0)
RDW: 16.1 % — ABNORMAL HIGH (ref 11.5–15.5)
WBC: 3.8 10*3/uL — ABNORMAL LOW (ref 4.0–10.5)

## 2011-07-24 MED ORDER — PNEUMOCOCCAL VAC POLYVALENT 25 MCG/0.5ML IJ INJ: 0.5000 mL | INJECTION | INTRAMUSCULAR | Status: DC

## 2011-07-24 NOTE — BHH Counselor (Signed)
Contacted SW-Amanda to inform her that patient is in the ED and needs follow up regarding his placement and disposition. Marchelle Folks sts that she is aware and has made calls to APS to coordinate patients discharge. Marchelle Folks is awaiting a response from APS. Writer will follow up with SW staff throughout today's shift for updates.

## 2011-07-24 NOTE — Progress Notes (Signed)
Left 2nd message for APS workers [place] Minor and [facility].  CSW to continue to follow.  Providence Crosby, LCSWA Clinical Social Work [phone]

## 2011-07-24 NOTE — Evaluation (Signed)
Occupational Therapy Evaluation Patient Details Name: Jeremy Howell MRN: [id number] DOB: 1955-08-13 Today's Date: 07/24/2011 Time: 4098-1191 OT Time Calculation (min): 22 min  OT Assessment / Plan / Recommendation Clinical Impression  This 56 year old man was brought to ED by [employer].  He has a h/o CVA 6 months ago and has had some agitation/aggressive behavior.  He needs min A for LB bathing and max A for LB dressing as he was getting frustrated.  He stand at mod I level.  He would benefit from ALF with a little ADL support to meet these needs.    OT Assessment       Follow Up Recommendations   ALF with assist for ADLs (min LB bathing; max LB dressing).     Barriers to Discharge      Equipment Recommendations   May need 3:1, depending on toilet height    Recommendations for Other Services    Frequency       Precautions / Restrictions Precautions Precautions: Fall;Other (comment) (h/o agitation) Restrictions Weight Bearing Restrictions: No   Pertinent Vitals/Pain No pain noted    ADL  Eating/Feeding: Simulated;Set up Where Assessed - Eating/Feeding: Chair Grooming: Performed;Wash/dry hands;Wash/dry face Where Assessed - Grooming: Unsupported sitting Upper Body Bathing: Performed;Set up Where Assessed - Upper Body Bathing: Unsupported sitting Lower Body Bathing: Performed;Minimal assistance Where Assessed - Lower Body Bathing: Unsupported standing Upper Body Dressing: Performed;Set up Where Assessed - Upper Body Dressing: Unsupported sitting Lower Body Dressing: Performed;Other (comment);Maximal assistance (could not don socks; difficulty putting LLE in) Where Assessed - Lower Body Dressing: Unsupported sit to stand Toileting - Clothing Manipulation and Hygiene: Simulated;Modified independent Where Assessed - Toileting Clothing Manipulation and Hygiene: Standing Transfers/Ambulation Related to ADLs: sit to stand only, mod I ADL Comments: Pt was getting frustrated with LB  dressing:  assisted himi.  He uses bil hands but L primarily.  R is partially flexed during functional activities however he can use it to tie pants.  Difficulty reaching to feet    OT Diagnosis:    OT Problem List:   OT Treatment Interventions:     OT Goals   1.  Pt will perform LB bathing with set up 2.  Pt will perform LB dressing with min A  Visit Information  Assistance Needed: +1    Subjective Data  Subjective: Agreeable to ADL   Prior Functioning  Home Living Additional Comments: for ALFplacmeent Prior Function Level of Independence:  (uknown) Communication Communication: Receptive difficulties;Expressive difficulties Dominant Hand: Left    Cognition  Overall Cognitive Status:  (had some difficulty understanding, also HOH) Arousal/Alertness: Awake/alert Orientation Level:  (unable to test) Behavior During Session:  (cooperative but some frustration as noted)    Extremity/Trunk Assessment Right Upper Extremity Assessment RUE ROM/Strength/Tone: Deficits RUE ROM/Strength/Tone Deficits: AAROM WFLs; AROM to 90 flexion; positions hand in fist but can open Left Upper Extremity Assessment LUE ROM/Strength/Tone: WFL for tasks assessed   Mobility Bed Mobility Bed Mobility: Supine to Sit Supine to Sit: 7: Independent Transfers Sit to Stand: 6: Modified independent (Device/Increase time) Stand to Sit: 6: Modified independent (Device/Increase time)   Exercise    Balance    End of Session OT - End of Session Activity Tolerance: Patient tolerated treatment well Patient left: in bed;with call bell/phone within reach   [facility] 07/24/2011, 10:00 AM Marica Otter, OTR/L [phone] 07/24/2011

## 2011-07-24 NOTE — ED Notes (Signed)
Sitter at bedside. Pt stable. Asleep. In view of Nursing station. Will cont to monitor patient
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
## 2011-07-24 NOTE — Progress Notes (Signed)
Informed by MD that Pt had returned to ED due to family members refusing to allow Pt back into the home.  Per RN, Julieta Bellini, PTAR did not await [employer]'s arrival.  As such, the family refused him.  PTAR returned Pt to ED.  Message from APS worker, Mr. Margo Aye, stating that he attempted to facilitate the Pt's return into the home but the family refused.  Mr. Margo Aye stated that the family is working diligently to locate family members/friends who may be willing to have Pt.  If they are successful, they indicated to Mr. Margo Aye that these people will pick Pt up from the ED.  Notified supervisor, [facility], of the situation.  Supervisor and MD spoke re: situation.  Plan has been made for Pt to be placed on med floor under observation status.  SW Dept will assist Pt with possible ALF placement.  LM for PASARR worker re: Pt's current status.  ED CSW, Lyla Son, to follow.  Providence Crosby, LCSWA Clinical Social Work [phone]

## 2011-07-24 NOTE — ED Notes (Signed)
3:24pm CSW was contacted by Viviano Simas Minor at [facility]. DSS/APS who stated that pt's sister Steward Drone had contacted APS and stated that she was able to locate a safe place for the pt to stay for 2 weeks. According to [place], DSS is supporting this discharge to Felix Ahmadi, caregiver at [address] in [place] for approximately 2 weeks. After that time, the pt will return to his home, as his sister Elease Hashimoto will have left the pt's home. Pt will continue to be assisted by Loann Quill. DSS to assist with placement and adult foster care programs.   CSW has updated EDP King on the new events who is in agreement with this plan. Pt's sister, Steward Drone arrived to the hospital to transport the pt to [facility] home. Pt was agreeable to this plan. CSW provided the pt and sister a list of local ALF's to assist with long term placement. CSW reviewed how placement can be found using the PCP at [facility]. CSW also provided the family with information on the [facility], as they expressed a need to have assistance in the home with their aging mother. At this time, the pt and sister identified no further needs.   CSW is signing off at this time.

## 2011-07-24 NOTE — Discharge Instructions (Signed)
Abuse and Neglect The following are signs and symptoms that may be seen in someone being abused. If you think someone is being abused, get help from a caregiver or professional near you. You may be the only protection that person has against abuse that they can not stop. PHYSICAL ABUSE is when a person is hit, slapped, beaten, burned, or otherwise physically harmed. Like other forms of abuse, physical abuse usually continues for a long time.  SEXUAL ABUSE is when a person engages in a sexual situation without permission. Sometimes this means direct sexual contact, such as intercourse, other genital contact or touching. But it can also mean that the person is made to watch sexual acts, look at another person's genitals, look at pornography or be part of the production of pornography. Children and the elderly many times are not forced into the sexual situation, but rather they are persuaded, bribed, tricked or coerced.  EMOTIONAL/ MENTAL ABUSE is when a person is regularly threatened, yelled at, humiliated, ignored, blamed or otherwise emotionally mistreated. For example, making fun of a person, calling a person names, and always finding fault are forms of emotional/mental abuse. NEGLECT is when a child's basic needs are not met. These needs include nutritious food, adequate shelter, clothing, cleanliness, emotional support, love and affection, education, safety, and medical and dental care. The following are some of the signs and symptoms that may be seen in someone being abused or neglected. If someone you know has any signs or symptoms of abuse or neglect, get help from a caregiver or professional near you. You may be the only protection that person has against abuse that they cannot stop. SIGNS AND SYMPTOMS OF ABUSE OR NEGLECT Child  Any injury (bruising, swelling, burns, broken bones) that cannot be explained.   Complaints of abdominal pain.   Agitation, anxiety, fear, hesitation to talk openly.    Wary of adult contact.   Frequent or uncontrolled urination or bowel movements.   Behavior extremes (strange, withdrawn, fearful, overly pleasing).   Afraid to go home; frightened of parents.   Reports injuries by parents.   Torn, stained or bloody underwear.   Pain or itching of the genital area.   Venereal disease, especially in pre-teens.   Pregnancy.   Unusual sexual behavior or knowledge.   Reports sexual assault by caretaker.   Lack of friends; does not want to go to school.   Consistent hunger, poor hygiene, inappropriate dress.   Lack of supervision; States there is no caretaker.   Constant fatigue or listlessness.   Unattended physical needs or medical problems.   Abandonment.   Decayed or painful teeth.   Begging, stealing food.   Loss of hair.   Alcohol or drug abuse.   Attempted suicide.  Spousal  Any injury (bruising, swelling, burns, broken bones) that cannot be explained.   Reports of sexual abuse.   Public and private demeaning actions.   Frightened behavior when with the opposite sex.   Reports of injuries by batterer.   Afraid to go home.   Drastic behavior changes in presence of batterer.  Elder  Any injury (bruising, swelling, burns, broken bones) that cannot be explained.   Difficulty walking or sitting.   Frequent hospital shopping or frequent use of emergency rooms.   Implausible stories, ambivalence, etc.   Withdrawal, fantasy, or infantile behavior.   Guilty feelings, anxieties.   Injury that has not been cared for properly.   Dehydration and/or malnourishment without illness-related cause.   Weight   loss.   Frequent or uncontrolled urination or bowel movements.   Poor skin hygiene; soiled clothing.   Evidence of inadequate/inappropriate administration of medications.   Agitation, anxiety, fear, hesitation to talk openly.  STEPS YOU CAN TAKE  Take your child out of the home if you feel that violence is  going to occur. Learn the warning signs of danger. This varies with situations but may include: use of alcohol; weapon threats; threats to your child, yourself and other family members or pets; forced sexual contact; or less frequent apologies.   Tell your doctor or nurse. They are legally obligated to report all suspected cases of abuse to the police.   If you or anyone you know is attacked or beaten, report it to the police so the abuse is documented.   Find someone you can trust and tell them what is happening. It is very important to get a child out of an abusive situation as soon as possible. They cannot protect themselves and are in danger.   It is important to have a safety plan in case you or your child is threatened:   Keep extra clothing for yourself and your children, medicines, money, important phone numbers and papers, and an extra set of car and house keys at a friend's or neighbor's house.   Tell a supportive friend or family member that you may show up at any time of day or night in an emergency.   If you do not have a close friend or family member, make a list of other safe places to go (shelters, crisis centers, etc.). Keep an abuse hotline number available. They can help you.   Many victims do not leave bad situations because they do not have money or a job. Planning ahead may help you in the future. Try to save money in a safe place. Keep your job or try to get a job. If you cannot get a job, try to obtain training you may need to prepare you for one. Social services are equipped to help you and your child. Do not stay or leave your child in an abusive situation. The result may be fatal.  TELEPHONE NUMBERS TO KEEP CLOSE AT HAND INCLUDE YOUR:   Local Social Services.   Local safe house or shelter.   Local emergency service (911 in the [place]  SEEK MEDICAL CARE IF:   Your child has new problems because of injuries.   You feel the level of danger growing that you or your  child may be abused.  SEEK IMMEDIATE MEDICAL CARE IF:   You are afraid that you or your child, or someone you know may be beaten or abused. Call emergency services (911) for help.   You or your child receives new injuries related to abuse.  Document Released: 10/22/2004 Document Revised: 01/15/2011 Document Reviewed: 06/06/2008 Exit[employer] Patient Information 2012 [employer]. 

## 2011-07-24 NOTE — Progress Notes (Signed)
LM for Laquisha Minor and [facility].  CSW to continue to follow.  Providence Crosby, LCSWA Clinical Social Work [phone]

## 2011-07-24 NOTE — Progress Notes (Signed)
Spoke with Mrs. Minor, [place] APS.  Apprised her of the situation.  Mrs. Minor to notify her supervisor, Gerre Scull, and requested that CSW contact Mr. Hall at [phone] and [employer] when [facility] has arrived at ED to transport Pt home.  Notified Pt, RN and MD.  Arranged for transportation.  Notified [place], [place] APS, and [employer] that [facility] has arrived to transport Pt home.  Pt d/c'd.  Providence Crosby, LCSWA Clinical Social Work [phone]
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
## 2011-07-24 NOTE — ED Provider Notes (Addendum)
Filed Vitals:   07/24/11 0545  BP: 161/84  Pulse: 58  Temp: 97.7 F (36.5 C)  Resp: 18  Patient is resting comfortably. Remains stable overnight without any complaints. Awaiting formal disposition from psychiatry.  Labs reviewed.  Orders reviewed and appropriate    Dayton Bailiff, MD 07/24/11 [phone]  Patient to be discharged back to home.  Dayton Bailiff, MD 07/24/11 1125

## 2011-07-24 NOTE — Discharge Instructions (Signed)
Depression, Adolescent and Adult Depression is a true and treatable medical condition. In general there are two kinds of depression:  Depression we all experience in some form. For example depression from the death of a loved one, financial distress or natural disasters will trigger or increase depression.   Clinical depression, on the other hand, appears without an apparent cause or reason. This depression is a disease. Depression may be caused by chemical imbalance in the body and brain or may come as a response to a physical illness. Alcohol and other drugs can cause depression.  DIAGNOSIS  The diagnosis of depression is usually based upon symptoms and medical history. TREATMENT  Treatments for depression fall into three categories. These are:  Drug therapy. There are many medicines that treat depression. Responses may vary and sometimes trial and error is necessary to determine the best medicines and dosage for a particular patient.   Psychotherapy, also called talking treatments, helps people resolve their problems by looking at them from a different point of view and by giving people insight into their own personal makeup. Traditional psychotherapy looks at a childhood source of a problem. Other psychotherapy will look at current conflicts and move toward solving those. If the cause of depression is drug use, counseling is available to help abstain. In time the depression will usually improve. If there were underlying causes for the chemical use, they can be addressed.   ECT (electroconvulsive therapy) or shock treatment is not as commonly used today. It is a very effective treatment for severe suicidal depression. During ECT electrical impulses are applied to the head. These impulses cause a generalized seizure. It can be effective but causes a loss of memory for recent events. Sometimes this loss of memory may include the last several months.  Treat all depression or suicide threats as  serious. Obtain professional help. Do not wait to see if serious depression will get better over time without help. Seek help for yourself or those around you. In the [place] the number to the National Suicide Help Lines With 24 Hour Help Are: 1-800-SUICIDE 1-800-784-2433 Document Released: 01/24/2000 Document Revised: 01/15/2011 Document Reviewed: 09/14/2007 ExitCare Patient Information 2012 [employer].  RESOURCE GUIDE  Chronic Pain Problems: Contact [facility]  [phone] Patients need to be referred by their primary care doctor.  Insufficient Money for Medicine: Contact United Way:  call "211" or [facility] 271-5999.  No Primary Care Doctor: - Call [facility]  832-8000 - can help you locate a primary care doctor that  accepts your insurance, provides certain services, etc. - Physician Referral Service- 1-800-533-3463  Agencies that provide inexpensive medical care: - [facility]  [phone] - [facility]  [phone] - [facility]  [phone] - [facility]  [phone] - [facility] Parenthood  [phone] - [facility]  [phone]  Medicaid-accepting [place] Providers: - [facility]- [address]  [phone], Mon-Fri 9am-7pm, Sat 9am-1pm - [facility]- [address], [address]  [phone] - [facility]- [address], [address]  [phone] - [facility]- [address] [place] [address]  [phone] - Veita B[facility]- [address], [address], [phone]  Only accepts [place] Access Medicaid patients after they have their name  applied to their card  Self Pay (no insurance) in [place]: - Sickle Cell Patients: Dr Eric Dean, [facility]  [address], [phone] - [facility]- [address]  [phone]       -     [facility] [place]- [address]       -     [facility]- see information above (Speak to Pam H if you do not have insurance)       -  [facility]- [address], [phone]       -  [facility] [place]- [address],  [phone]       -  [facility]- [address] [place] [address], [phone]       -  Dr Osei-Bonsu-  [address], [address], [place], [phone]       -  [facility]- [address], [phone]       -  [facility]- [address] [place] [address], [phone], also [address], [phone]       -    [facility]- [address], [phone], 1st & 3rd Saturday   every month, 10am-1pm  1) Find a Doctor and Pay Out of Pocket Although you won't have to find out who is covered by your insurance plan, it is a good idea to ask around and get recommendations. You will then need to call the office and see if the doctor you have chosen will accept you as a new patient and what types of options they offer for patients who are self-pay. Some doctors offer discounts or will set up payment plans for their patients who do not have insurance, but you will need to ask so you aren't surprised when you get to your appointment.  2) Contact Your Local Health Department Not all health departments have doctors that can see patients for sick visits, but many do, so it is worth a call to see if yours does. If you don't know where your local health department is, you can check in your phone book. The CDC also has a tool to help you locate your state's health department, and many state websites also have listings of all of their local health departments.  3) Find a Walk-in Clinic If your illness is not likely to be very severe or complicated, you may want to try a walk in clinic. These are popping up all over the country in pharmacies, drugstores, and shopping centers. They're usually staffed by nurse practitioners or physician assistants that have been trained to treat  common illnesses and complaints. They're usually fairly quick and inexpensive. However, if you have serious medical issues or chronic medical problems, these are probably not your best option  STD Testing - [place] [facility] [place], [facility], [address], [place], phone [phone] or 1-877-539-9860.  Monday - Friday, call for an appointment. - [place] [facility] [place], [facility], [address], [place], phone [phone] or 1-877-539-9860.  Monday - Friday, call for an appointment.  Abuse/Neglect: - [place] Child Abuse Hotline (336) 641-3795 - [place] Child Abuse Hotline 800-378-5315 (After Hours)  Emergency Shelter:   [facility] (336) 271-5985  Maternity Homes: - [facility] (336) 275-9566 - [facility] (704) 372-4663  MRSA Hotline #:   832-7006  [place] Resources  [facility] [place]  [employer] [place] [facility] [address]                 [address]         [address]  [place]                                               [place]                              [place] Phone:  [phone]                                  Phone:  [phone]                   Phone:  [phone]  [place] [facility], [phone] - [place] Services - [facility]- 1-888-581-9988       -     [facility] in [place], [address],                                  336-349-4454, Insurance  [place] Child Abuse Hotline (336) 342-1394 or (336) 342-3537 (After Hours)   Behavioral Health Services  Substance Abuse Resources: - [facility]  336-882-2125 - [facility] 336-784-9470 - [facility] 336-285-9073 - [facility] 336-845-3988 - Residential & Outpatient Substance Abuse Program   800-659-3381  Psychological Services: - [facility]  832-9600 - [facility]  [phone] - [place] [facility], [address], [place], ACCESS LINE: 1-800-853-5163 or 336-641-4981, Http://www.[facility].com/services/adult.htm  Dental Assistance  If unable to pay or uninsured, contact:  [facility] or [place] [facility] to become qualified for the adult dental clinic.  Patients with Medicaid: [facility] [address], [phone], [phone]  If unable to pay, or uninsured, contact [facility] ([phone]) or [place] [facility] ([phone] in [place], [phone] in [place]) to become qualified for the adult dental clinic  Other Low-Cost Community Dental Services: - [facility]- [address], [place], [place], [address], [phone], [address], 2nd and 4th Thursday of the month at 6:30am.  10 clients each day by appointment, can sometimes see walk-in patients if someone does not show for an appointment. - [facility]- [address], [place], [place], [address], [phone] - [facility]- [address], [place], [place], [address], [phone] - [place] [facility]- [phone] - [facility]- [phone] - [facility]- [phone]     

## 2011-07-28 NOTE — Progress Notes (Signed)
UR completed for 07/21/11-07/24/11

## 2011-08-14 ENCOUNTER — Encounter (HOSPITAL_COMMUNITY): Payer: Self-pay

## 2011-08-14 ENCOUNTER — Emergency Department (HOSPITAL_COMMUNITY): Payer: Medicare Other

## 2011-08-14 ENCOUNTER — Inpatient Hospital Stay (HOSPITAL_COMMUNITY)
Admission: EM | Admit: 2011-08-14 | Discharge: 2011-08-19 | DRG: 239 | Disposition: A | Discharge status: Skilled Nursing Facility | Payer: Medicare Other | Attending: Internal Medicine | Admitting: Internal Medicine

## 2011-08-14 DIAGNOSIS — E872 Acidosis, unspecified: Secondary | ICD-10-CM | POA: Diagnosis present

## 2011-08-14 DIAGNOSIS — I69959 Hemiplegia and hemiparesis following unspecified cerebrovascular disease affecting unspecified side: Secondary | ICD-10-CM

## 2011-08-14 DIAGNOSIS — I5022 Chronic systolic (congestive) heart failure: Secondary | ICD-10-CM

## 2011-08-14 DIAGNOSIS — Z8673 Personal history of transient ischemic attack (TIA), and cerebral infarction without residual deficits: Secondary | ICD-10-CM | POA: Diagnosis present

## 2011-08-14 DIAGNOSIS — Z96649 Presence of unspecified artificial hip joint: Secondary | ICD-10-CM

## 2011-08-14 DIAGNOSIS — I1310 Hypertensive heart and chronic kidney disease without heart failure, with stage 1 through stage 4 chronic kidney disease, or unspecified chronic kidney disease: Secondary | ICD-10-CM | POA: Diagnosis present

## 2011-08-14 DIAGNOSIS — R4701 Aphasia: Secondary | ICD-10-CM

## 2011-08-14 DIAGNOSIS — E870 Hyperosmolality and hypernatremia: Secondary | ICD-10-CM | POA: Diagnosis not present

## 2011-08-14 DIAGNOSIS — I509 Heart failure, unspecified: Secondary | ICD-10-CM

## 2011-08-14 DIAGNOSIS — I5042 Chronic combined systolic (congestive) and diastolic (congestive) heart failure: Secondary | ICD-10-CM | POA: Diagnosis present

## 2011-08-14 DIAGNOSIS — Z7982 Long term (current) use of aspirin: Secondary | ICD-10-CM

## 2011-08-14 DIAGNOSIS — Z91199 Patient's noncompliance with other medical treatment and regimen due to unspecified reason: Secondary | ICD-10-CM

## 2011-08-14 DIAGNOSIS — I69920 Aphasia following unspecified cerebrovascular disease: Secondary | ICD-10-CM

## 2011-08-14 DIAGNOSIS — Z79899 Other long term (current) drug therapy: Secondary | ICD-10-CM

## 2011-08-14 DIAGNOSIS — I161 Hypertensive emergency: Secondary | ICD-10-CM

## 2011-08-14 DIAGNOSIS — I70269 Atherosclerosis of native arteries of extremities with gangrene, unspecified extremity: Principal | ICD-10-CM | POA: Diagnosis present

## 2011-08-14 DIAGNOSIS — N19 Unspecified kidney failure: Secondary | ICD-10-CM

## 2011-08-14 DIAGNOSIS — I428 Other cardiomyopathies: Secondary | ICD-10-CM

## 2011-08-14 DIAGNOSIS — E875 Hyperkalemia: Secondary | ICD-10-CM

## 2011-08-14 DIAGNOSIS — I429 Cardiomyopathy, unspecified: Secondary | ICD-10-CM

## 2011-08-14 DIAGNOSIS — I739 Peripheral vascular disease, unspecified: Secondary | ICD-10-CM | POA: Diagnosis present

## 2011-08-14 DIAGNOSIS — I639 Cerebral infarction, unspecified: Secondary | ICD-10-CM

## 2011-08-14 DIAGNOSIS — I69320 Aphasia following cerebral infarction: Secondary | ICD-10-CM | POA: Diagnosis present

## 2011-08-14 DIAGNOSIS — F17200 Nicotine dependence, unspecified, uncomplicated: Secondary | ICD-10-CM | POA: Diagnosis present

## 2011-08-14 DIAGNOSIS — I96 Gangrene, not elsewhere classified: Secondary | ICD-10-CM

## 2011-08-14 DIAGNOSIS — Z9119 Patient's noncompliance with other medical treatment and regimen: Secondary | ICD-10-CM

## 2011-08-14 DIAGNOSIS — I10 Essential (primary) hypertension: Secondary | ICD-10-CM | POA: Diagnosis present

## 2011-08-14 DIAGNOSIS — R7303 Prediabetes: Secondary | ICD-10-CM | POA: Diagnosis present

## 2011-08-14 DIAGNOSIS — F1010 Alcohol abuse, uncomplicated: Secondary | ICD-10-CM | POA: Diagnosis present

## 2011-08-14 DIAGNOSIS — E876 Hypokalemia: Secondary | ICD-10-CM | POA: Diagnosis not present

## 2011-08-14 DIAGNOSIS — N170 Acute kidney failure with tubular necrosis: Secondary | ICD-10-CM | POA: Diagnosis present

## 2011-08-14 DIAGNOSIS — N179 Acute kidney failure, unspecified: Secondary | ICD-10-CM

## 2011-08-14 DIAGNOSIS — N183 Chronic kidney disease, stage 3 unspecified: Secondary | ICD-10-CM | POA: Diagnosis present

## 2011-08-14 DIAGNOSIS — M6282 Rhabdomyolysis: Secondary | ICD-10-CM | POA: Diagnosis present

## 2011-08-14 DIAGNOSIS — E119 Type 2 diabetes mellitus without complications: Secondary | ICD-10-CM

## 2011-08-14 LAB — URINALYSIS, ROUTINE W REFLEX MICROSCOPIC
Ketones, ur: NEGATIVE mg/dL
Nitrite: NEGATIVE
Protein, ur: 30 mg/dL — AB

## 2011-08-14 LAB — COMPREHENSIVE METABOLIC PANEL
Alkaline Phosphatase: 87 U/L (ref 39–117)
BUN: 142 mg/dL — ABNORMAL HIGH (ref 6–23)
Chloride: 98 mEq/L (ref 96–112)
GFR calc Af Amer: 5 mL/min — ABNORMAL LOW (ref >90)
GFR calc non Af Amer: 4 mL/min — ABNORMAL LOW (ref >90)
Glucose, Bld: 108 mg/dL — ABNORMAL HIGH (ref 70–99)
Potassium: >7.5 mEq/L (ref 3.5–5.1)
Total Bilirubin: 0.4 mg/dL (ref 0.3–1.2)

## 2011-08-14 LAB — PROTIME-INR: Prothrombin Time: 15.9 seconds — ABNORMAL HIGH (ref 11.6–15.2)

## 2011-08-14 LAB — DIFFERENTIAL
Eosinophils Absolute: 0 10*3/uL (ref 0.0–0.7)
Eosinophils Relative: 0 % (ref 0–5)
Lymphs Abs: 1.4 10*3/uL (ref 0.7–4.0)
Monocytes Relative: 14 % — ABNORMAL HIGH (ref 3–12)
Neutrophils Relative %: 76 % (ref 43–77)

## 2011-08-14 LAB — BASIC METABOLIC PANEL
BUN: 150 mg/dL — ABNORMAL HIGH (ref 6–23)
CO2: 14 mEq/L — ABNORMAL LOW (ref 19–32)
Calcium: 9.3 mg/dL (ref 8.4–10.5)
Creatinine, Ser: 11.68 mg/dL — ABNORMAL HIGH (ref 0.50–1.35)
GFR calc Af Amer: 5 mL/min — ABNORMAL LOW (ref >90)
GFR calc non Af Amer: 4 mL/min — ABNORMAL LOW (ref >90)
Glucose, Bld: 192 mg/dL — ABNORMAL HIGH (ref 70–99)
Potassium: >7.5 mEq/L (ref 3.5–5.1)
Sodium: 144 mEq/L (ref 135–145)

## 2011-08-14 LAB — CBC
Hemoglobin: 16.8 g/dL (ref 13.0–17.0)
MCH: 27.4 pg (ref 26.0–34.0)
MCV: 79.2 fL (ref 78.0–100.0)
RBC: 6.14 MIL/uL — ABNORMAL HIGH (ref 4.22–5.81)

## 2011-08-14 LAB — TROPONIN I: Troponin I: 0.3 ng/mL (ref <0.30)

## 2011-08-14 LAB — LACTIC ACID, PLASMA: Lactic Acid, Venous: 2.6 mmol/L — ABNORMAL HIGH (ref 0.5–2.2)

## 2011-08-14 MED ORDER — SODIUM BICARBONATE 8.4 % IV SOLN
100.0000 meq | Freq: Once | INTRAVENOUS | Status: AC
  Administered 2011-08-14: 100 meq via INTRAVENOUS
  Filled 2011-08-14 (×2): qty 50

## 2011-08-14 MED ORDER — SODIUM CHLORIDE 0.9 % IV BOLUS (SEPSIS)
500.0000 mL | Freq: Once | INTRAVENOUS | Status: AC
  Administered 2011-08-14: 500 mL via INTRAVENOUS

## 2011-08-14 MED ORDER — SODIUM BICARBONATE 8.4 % IV SOLN
50.0000 meq | Freq: Once | INTRAVENOUS | Status: AC
  Administered 2011-08-14: 50 meq via INTRAVENOUS

## 2011-08-14 MED ORDER — SODIUM CHLORIDE 0.9 % IJ SOLN
3.0000 mL | Freq: Two times a day (BID) | INTRAMUSCULAR | Status: DC
  Administered 2011-08-15 – 2011-08-19 (×8): 3 mL via INTRAVENOUS

## 2011-08-14 MED ORDER — INSULIN ASPART 100 UNIT/ML ~~LOC~~ SOLN
10.0000 [IU] | Freq: Once | SUBCUTANEOUS | Status: AC
  Administered 2011-08-14: 10 [IU] via SUBCUTANEOUS

## 2011-08-14 MED ORDER — DEXTROSE 50 % IV SOLN
1.0000 | Freq: Once | INTRAVENOUS | Status: AC
  Administered 2011-08-14: 50 mL via INTRAVENOUS
  Filled 2011-08-14: qty 50

## 2011-08-14 MED ORDER — SODIUM CHLORIDE 0.9 % IV SOLN: INTRAVENOUS | Status: DC

## 2011-08-14 MED ORDER — ACETAMINOPHEN 325 MG PO TABS
650.0000 mg | ORAL_TABLET | Freq: Four times a day (QID) | ORAL | Status: DC | PRN
  Administered 2011-08-15: 650 mg via ORAL
  Filled 2011-08-14: qty 2

## 2011-08-14 MED ORDER — ASPIRIN 325 MG PO TABS
325.0000 mg | ORAL_TABLET | Freq: Every day | ORAL | Status: DC
  Administered 2011-08-16 – 2011-08-19 (×4): 325 mg via ORAL
  Filled 2011-08-14 (×5): qty 1

## 2011-08-14 MED ORDER — HYDRALAZINE HCL 20 MG/ML IJ SOLN
10.0000 mg | Freq: Four times a day (QID) | INTRAMUSCULAR | Status: DC | PRN
  Administered 2011-08-15 – 2011-08-18 (×5): 10 mg via INTRAVENOUS
  Filled 2011-08-14 (×3): qty 0.5

## 2011-08-14 MED ORDER — MORPHINE SULFATE 2 MG/ML IJ SOLN
2.0000 mg | INTRAMUSCULAR | Status: DC | PRN
  Administered 2011-08-14 – 2011-08-15 (×2): 2 mg via INTRAVENOUS
  Filled 2011-08-14 (×2): qty 1

## 2011-08-14 MED ORDER — INSULIN ASPART 100 UNIT/ML ~~LOC~~ SOLN
10.0000 [IU] | Freq: Once | SUBCUTANEOUS | Status: AC
  Administered 2011-08-14: 10 [IU] via INTRAVENOUS
  Filled 2011-08-14: qty 1

## 2011-08-14 MED ORDER — DEXTROSE 50 % IV SOLN
INTRAVENOUS | Status: AC
  Administered 2011-08-14: 50 mL via INTRAVENOUS
  Filled 2011-08-14: qty 50

## 2011-08-14 MED ORDER — HEPARIN SODIUM (PORCINE) 5000 UNIT/ML IJ SOLN
5000.0000 [IU] | Freq: Three times a day (TID) | INTRAMUSCULAR | Status: DC
  Administered 2011-08-14: 5000 [IU] via SUBCUTANEOUS
  Filled 2011-08-14 (×5): qty 1

## 2011-08-14 MED ORDER — DEXTROSE 50 % IV SOLN
1.0000 | Freq: Once | INTRAVENOUS | Status: AC
  Administered 2011-08-14: 50 mL via INTRAVENOUS

## 2011-08-14 MED ORDER — FOLIC ACID 1 MG PO TABS
1.0000 mg | ORAL_TABLET | Freq: Every day | ORAL | Status: DC
  Administered 2011-08-14 – 2011-08-19 (×5): 1 mg via ORAL
  Filled 2011-08-14 (×6): qty 1

## 2011-08-14 MED ORDER — INSULIN ASPART 100 UNIT/ML ~~LOC~~ SOLN
SUBCUTANEOUS | Status: AC
  Filled 2011-08-14: qty 1

## 2011-08-14 MED ORDER — SODIUM CHLORIDE 0.9 % IV SOLN
INTRAVENOUS | Status: AC
  Filled 2011-08-14: qty 100

## 2011-08-14 MED ORDER — SODIUM CHLORIDE 0.9 % IV SOLN
Freq: Once | INTRAVENOUS | Status: AC
  Administered 2011-08-14: 500 mL via INTRAVENOUS

## 2011-08-14 MED ORDER — SODIUM BICARBONATE 8.4 % IV SOLN
INTRAVENOUS | Status: AC
  Filled 2011-08-14: qty 50

## 2011-08-14 MED ORDER — ALBUTEROL SULFATE (5 MG/ML) 0.5% IN NEBU: 2.5000 mg | INHALATION_SOLUTION | RESPIRATORY_TRACT | Status: DC | PRN

## 2011-08-14 MED ORDER — SODIUM POLYSTYRENE SULFONATE 15 GM/60ML PO SUSP
50.0000 g | Freq: Once | ORAL | Status: AC
  Administered 2011-08-14: 50 g via ORAL
  Filled 2011-08-14: qty 240

## 2011-08-14 MED ORDER — SODIUM CHLORIDE 0.9 % IV SOLN
INTRAVENOUS | Status: DC
  Administered 2011-08-14 – 2011-08-15 (×2): via INTRAVENOUS

## 2011-08-14 MED ORDER — SODIUM POLYSTYRENE SULFONATE 15 GM/60ML PO SUSP
30.0000 g | Freq: Once | ORAL | Status: AC
  Administered 2011-08-14: 30 g via ORAL
  Filled 2011-08-14: qty 180

## 2011-08-14 MED ORDER — SODIUM CHLORIDE 0.9 % IV SOLN
1.0000 g | Freq: Once | INTRAVENOUS | Status: AC
  Administered 2011-08-14: 1 g via INTRAVENOUS
  Filled 2011-08-14: qty 10

## 2011-08-14 MED ORDER — ACETAMINOPHEN 650 MG RE SUPP: 650.0000 mg | Freq: Four times a day (QID) | RECTAL | Status: DC | PRN

## 2011-08-14 MED ORDER — SODIUM BICARBONATE 8.4 % IV SOLN
INTRAVENOUS | Status: DC
  Administered 2011-08-14: 18:00:00 via INTRAVENOUS
  Filled 2011-08-14 (×4): qty 150

## 2011-08-14 MED ORDER — CALCIUM GLUCONATE 10 % IV SOLN
INTRAVENOUS | Status: AC
  Filled 2011-08-14: qty 10

## 2011-08-14 NOTE — Progress Notes (Signed)
VASCULAR LAB PRELIMINARY  PRELIMINARY  PRELIMINARY  PRELIMINARY  Lower extremity arterial Doppler and duplex completed.    Preliminary report: Right duplex and Doppler reveal moderate to severe disease in the leg.  Duplex imaging demonstrates diseased common femoral, superficial femoral and profunda femoral arteries.  All appear patent with the proximal popliteal artery, the distal popliteal artery appears occluded with collaterals distally.  Right ABI in the 0.45 to 0.60 range.  LEFT: The common femoral artery is patent as is the proximal profunda femoral artery.  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  No audible flow noted below the common femoral.  Pedal arteries not insonated.  Left leg exam consistent with critical, limb threatening ischemia.  Tensley Wery,  RVT 08/14/2011, 4:52 PM

## 2011-08-14 NOTE — ED Notes (Signed)
Pt on stretcher, nad noted, abc intact, will monitor pt while awaiting furthur orders. Family at bedside, and dr Lavera Guise and Georgette Dover at bedside.

## 2011-08-14 NOTE — ED Notes (Signed)
Lab called CBC clotting phlebotomy will have to re draw.

## 2011-08-14 NOTE — ED Notes (Signed)
First contact with pt. Family at bedside. Pt sitting with eyes open acknowledges when spoken to words incompreshensible. Left toes to mid foot cold dark no pp pulse. Pt ready for vascular study.

## 2011-08-14 NOTE — Consult Note (Addendum)
[facility] [place] Consult    History of Present Illness:  Patient is a 56 y.o. year old male who presents for evaluation of ischemic left foot.  Pt apparently fell several days ago.  Sister noted foot was cool with areas of "bruising" today.  Other medical problems include prior stroke with some aphasia according to sister who is his caretaker and provided most of her history. Pt complains of pain if you touch his foot or knee.  Apparently was ambulatory prior to fall but not now. He now has renal failure as well.  Other medical problems include cardiomyopathy, prior CVA with right side weakness, hypertension, diabetes.  Past Medical History  Diagnosis Date  . Cardiomyopathy     EF 20-25% by echo 01/2010 with severe LVH felt secondary to substance abuse (no ischemic workup)  . 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 Diabetes mellitus   . HTN (hypertension)   . Polysubstance abuse     Reported hx of cocaine, THC, heroin, EtOH abuse  . Noncompliance     Past Surgical History  Procedure Date  . Total hip arthroplasty      Social History History  Substance Use Topics  . Smoking status: Current Everyday Smoker -- 0.5 packs/day for 15 years    Types: Cigarettes  . Smokeless tobacco: Current User   Comment: declined  . Alcohol Use: 1.0 oz/week    2 drink(s) per week    Family History Family History  Problem Relation Age of Onset  . Stroke Brother     Allergies  Allergies  Allergen Reactions  . Almond Oil Shortness Of Breath and Swelling     Current Facility-Administered Medications  Medication Dose Route Frequency Provider Last Rate Last Dose  . 0.9 %  sodium chloride infusion   Intravenous Once Toy Baker, MD 125 mL/hr at 08/14/11 1205 500 mL at 08/14/11 1205  . 0.9 %  sodium chloride infusion   Intravenous Continuous Sorin C Laza, MD      . calcium gluconate 1 g in sodium  chloride 0.9 % 100 mL IVPB  1 g Intravenous Once Toy Baker, MD   1 g at 08/14/11 1425  . calcium gluconate 10 % injection           . dextrose 50 % solution 50 mL  1 ampule Intravenous Once Toy Baker, MD   50 mL at 08/14/11 1424  . insulin aspart (novoLOG) injection 10 Units  10 Units Subcutaneous Once Toy Baker, MD   10 Units at 08/14/11 1409  . sodium bicarbonate 150 mEq in dextrose 5 % 1,000 mL infusion   Intravenous Continuous Sorin C Laza, MD      . sodium bicarbonate injection 50 mEq  50 mEq Intravenous Once Toy Baker, MD   50 mEq at 08/14/11 1404  . sodium chloride 0.9 % bolus 500 mL  500 mL Intravenous Once Sorin C Laza, MD      . sodium chloride 0.9 % infusion           . sodium polystyrene (KAYEXALATE) 15 GM/60ML suspension 30 g  30 g Oral Once Toy Baker, MD   30 g at 08/14/11 1435   Current Outpatient Prescriptions  Medication Sig Dispense Refill  . aspirin 325 MG tablet Take 1 tablet (325 mg total) by mouth daily.      Marland Kitchen  atenolol (TENORMIN) 50 MG tablet Take 50 mg by mouth daily.      . carvedilol (COREG) 25 MG tablet Take 25 mg by mouth 2 (two) times daily with a meal.      . cloNIDine (CATAPRES) 0.1 MG tablet Take 0.1 mg by mouth 3 (three) times daily.      Marland Kitchen HYDROcodone-acetaminophen (VICODIN) 5-500 MG per tablet Take 1 tablet by mouth every 6 (six) hours as needed. pain      . spironolactone (ALDACTONE) 25 MG tablet Take 25 mg by mouth daily.      . folic acid (FOLVITE) 1 MG tablet Take 1 mg by mouth daily.        . furosemide (LASIX) 20 MG tablet Take 20 mg by mouth daily.          ROS:   General:  No weight loss, Fever, chills  HEENT: No recent headaches, no nasal bleeding, no visual changes, no sore throat  Neurologic: No dizziness, blackouts, seizures. No recent symptoms of stroke or mini- stroke. No recent episodes of slurred speech, or temporary blindness.  Cardiac: No recent episodes of chest pain/pressure, no shortness of breath at  rest.    Vascular: No history of rest pain in feet.  No history of claudication.  No history of non-healing ulcer, No history of DVT   Pulmonary: No home oxygen, no productive cough, no hemoptysis,  No asthma or wheezing  Musculoskeletal:  [ ]  Arthritis, [ ]  Low back pain,  [x ] Joint pain  Hematologic:No history of hypercoagulable state.  No history of easy bleeding.  No history of anemia  Gastrointestinal: No hematochezia or melena,  No gastroesophageal reflux, no trouble swallowing  Urinary: [ ]  chronic Kidney disease, [ ]  on HD - [ ]  MWF or [ ]  TTHS, [ ]  Burning with urination, [ ]  Frequent urination, [ ]  Difficulty urinating;   Skin: No rashes  Psychological: No history of anxiety,  No history of depression   Physical Examination  Filed Vitals:   08/14/11 1415 08/14/11 1430 08/14/11 1515 08/14/11 1545  BP: 165/87 144/83 161/87 144/94  Pulse: 70 71 69 72  Temp:      TempSrc:      Resp: 10 15 14 10   SpO2: 98% 96% 96% 97%    There is no height or weight on file to calculate BMI.  General:  Alert and oriented, no acute distress HEENT: Normal Neck: No bruit or JVD Pulmonary: Clear to auscultation bilaterally Cardiac: Regular Rate and Rhythm without murmur Abdomen: thin, Soft, non-tender, non-distended, no mass, no scars Skin: No rash, left foot mottled and cool with some mottling in calf Extremity Pulses:  2+ radial, brachial, femoral, absent dorsalis pedis, posterior tibial pulses bilaterally Musculoskeletal: No deformity or edema  Neurologic: left foot paralyzed, unable to flex toes or move ankle   ASSESSMENT: Infarcted left foot, non salvageable.  Spoke with pt's mother and sister and discussed with them that foot is not salvageable and he will required AKA.  Increased CK potassium lactate can probably all be explained by his foot.   PLAN:  Needs left AKA when hyperkalemia controlled              Will follow  [name], MD [facility]  [place] Office: [phone] Pager: [phone]   08/15/11  Left foot cool and ischemic Hyperkalemia improved For left AKA and diatek today  [name], MD [facility] [place] Office: [phone] Pager: [phone]

## 2011-08-14 NOTE — ED Provider Notes (Signed)
History     CSN: [id number]  Arrival date & time 08/14/11  1129   First MD Initiated Contact with Patient 08/14/11 1144      Chief Complaint  Patient presents with  . Fall    (Consider location/radiation/quality/duration/timing/severity/associated sxs/prior treatment) Patient is a 56 y.o. male presenting with fall. The history is provided by the patient.  Fall   patient here after a fall 3 days ago and has been unable to get out of the bed since then. Falls witnessed by family. Patient complains of left hip pain. He does have a prior history of CVA with resultant right-sided deficits. Patient also has minimal verbal skills from the prior CVA. Notes pain with movement of his left hip or left foot. Has had anorexia and decreased intake. EMS called and patient given pain medication due to his worsening left hip pain  Past Medical History  Diagnosis Date  . Cardiomyopathy     EF 20-25% by echo 01/2010 with severe LVH felt secondary to substance abuse (no ischemic workup)  . 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 Diabetes mellitus   . HTN (hypertension)   . Polysubstance abuse     Reported hx of cocaine, THC, heroin, EtOH abuse  . Noncompliance     No past surgical history on file.  No family history on file.  History  Substance Use Topics  . Smoking status: Current Everyday Smoker -- 0.5 packs/day for 15 years    Types: Cigarettes  . Smokeless tobacco: Current User   Comment: declined  . Alcohol Use: No      Review of Systems  Unable to perform ROS   Allergies  Almond oil  Home Medications   Current Outpatient Rx  Name Route Sig Dispense Refill  . ASPIRIN 325 MG PO TABS Oral Take 1 tablet (325 mg total) by mouth daily.    Marland Kitchen CARVEDILOL 12.5 MG PO TABS Oral Take 12.5 mg by mouth 2 (two) times daily with a meal.      . CLONIDINE HCL 0.2 MG PO TABS  One tablet by mouth three times a day 30 tablet 0   . FOLIC ACID 1 MG PO TABS Oral Take 1 mg by mouth daily.      . FUROSEMIDE 20 MG PO TABS Oral Take 20 mg by mouth daily.      Marland Kitchen OLMESARTAN MEDOXOMIL 40 MG PO TABS Oral Take 1 tablet (40 mg total) by mouth daily. 30 tablet 0    BP 142/78  Pulse 71  Temp 97.7 F (36.5 C) (Oral)  Resp 18  SpO2 98%  Physical Exam  Nursing note and vitals reviewed. Constitutional: He appears well-nourished. He appears cachectic.  Non-toxic appearance. He has a sickly appearance. He appears ill. No distress.  HENT:  Head: Normocephalic and atraumatic.  Eyes: Conjunctivae, EOM and lids are normal. Pupils are equal, round, and reactive to light.  Neck: Normal range of motion. Neck supple. No tracheal deviation present. No mass present.  Cardiovascular: Normal rate, regular rhythm and normal heart sounds.  Exam reveals no gallop.   No murmur heard. Pulmonary/Chest: Effort normal and breath sounds normal. No stridor. No respiratory distress. He has no decreased breath sounds. He has no wheezes. He has no rhonchi. He has no rales.  Abdominal: Soft. Normal appearance and bowel sounds are normal. He exhibits no distension. There is no tenderness. There is no  rebound and no CVA tenderness.  Musculoskeletal: Normal range of motion. He exhibits no edema and no tenderness.       Left foot bruising with cyansois and cool to touch, popliteal pulse palpable, sensation intact  Neurological: He is alert. A sensory deficit is present. No cranial nerve deficit. GCS eye subscore is 4. GCS verbal subscore is 5. GCS motor subscore is 6.       Baseline right sided weakness from prior cva  Skin: Skin is warm and dry. No abrasion and no rash noted.  Psychiatric: He has a normal mood and affect. His speech is normal and behavior is normal.    ED Course  Procedures (including critical care time)   Labs Reviewed  PROTIME-INR  APTT  CBC WITH DIFFERENTIAL  COMPREHENSIVE METABOLIC PANEL  URINALYSIS, ROUTINE W REFLEX  MICROSCOPIC  URINE CULTURE  CK  TROPONIN I  LACTIC ACID, PLASMA   No results found.   No diagnosis found.    MDM   Date: 08/14/2011  Rate:     Rhythm: normal sinus rhythm  QRS Axis: normal  Intervals: normal  ST/T Wave abnormalities: peaked t-waves  Conduction Disutrbances:nonspecific interventricular conduction delay  Narrative Interpretation:   Old EKG Reviewed: changes noted  Patient's potassium noted  And patient treated with insulin, glucose, bicarbonate, Kayexalate, calcium gluconate. Patient rechecked multiple times and is stable. Spoke with [facility] hospitalist and patient will be admitted to step down  CRITICAL CARE Performed by: Toy Baker   Total critical care time: 75  Critical care time was exclusive of separately billable procedures and treating other patients.  Critical care was necessary to treat or prevent imminent or life-threatening deterioration.  Critical care was time spent personally by me on the following activities: development of treatment plan with patient and/or surrogate as well as nursing, discussions with consultants, evaluation of patient's response to treatment, examination of patient, obtaining history from patient or surrogate, ordering and performing treatments and interventions, ordering and review of laboratory studies, ordering and review of radiographic studies, pulse oximetry and re-evaluation of patient's condition.          Toy Baker, MD 08/14/11 [phone]
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
## 2011-08-14 NOTE — ED Notes (Signed)
Report called to floor pt in doppler at this time.

## 2011-08-14 NOTE — Progress Notes (Signed)
HPI: I was asked by Dr. Lavera Guise to see Jeremy Howell who is a 57 y.o. male who presented to the emergency room with altered mental status and weakness; and was found to have a cold left lower extremity, anion gap acidosis, hyperkalemia to 7.5 meq/l and a Ck of over 13,000.  We were asked to assist with management.  Past Medical History  Diagnosis Date  . Cardiomyopathy     EF 20-25% by echo 01/2010 with severe LVH felt secondary to substance abuse (no ischemic workup)  . 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 Diabetes mellitus   . HTN (hypertension)   . Polysubstance abuse     Reported hx of cocaine, THC, heroin, EtOH abuse  . Noncompliance    Past Surgical History  Procedure Date  . Total hip arthroplasty    Social History:  reports that he has been smoking Cigarettes.  He has a 7.5 pack-year smoking history. He uses smokeless tobacco. He reports that he drinks about one ounce of alcohol per week. He reports that he does not use illicit drugs. Allergies:  Allergies  Allergen Reactions  . Almond Oil Shortness Of Breath and Swelling   Family History  Problem Relation Age of Onset  . Stroke Brother     Medications:  Scheduled:   . sodium chloride   Intravenous Once  . calcium gluconate 1 GM IV  1 g Intravenous Once  . calcium gluconate      . dextrose  1 ampule Intravenous Once  . dextrose  1 ampule Intravenous Once  . insulin aspart  10 Units Subcutaneous Once  . insulin aspart  10 Units Intravenous Once  . sodium bicarbonate  100 mEq Intravenous Once  . sodium bicarbonate  50 mEq Intravenous Once  . sodium chloride  500 mL Intravenous Once  . sodium chloride      . sodium polystyrene  30 g Oral Once   ROS: not obtainable from patient Blood pressure 144/94, pulse 72, temperature 97.9 F (36.6 C), temperature source Oral, resp. rate 10, SpO2 97.00%.  General appearance: cachectic, slowed mentation and  toxic Head: sunken features Eyes: negative Nose: no discharge Throat: lips, mucosa, and tongue normal; teeth and gums normal and poor dentition Resp: clear to auscultation bilaterally Chest wall: no tenderness Cardio: regular rate and rhythm, S1, S2 normal, no murmur, click, rub or gallop GI: soft, non-tender; bowel sounds normal; no masses,  no organomegaly Extremities: left leg cold to knee Skin: discoloration with cyanosis of LLE, reduced skin turgor Neurologic: lethargic Results for orders placed during the hospital encounter of 08/14/11 (from the past 48 hour(s))  PROTIME-INR     Status: Abnormal   Collection Time   08/14/11 12:29 PM      Component Value Range Comment   Prothrombin Time 15.9 (*) 11.6 - 15.2 seconds    INR 1.24  0.00 - 1.49   APTT     Status: Normal   Collection Time   08/14/11 12:29 PM      Component Value Range Comment   aPTT 24  24 - 37 seconds   COMPREHENSIVE METABOLIC PANEL     Status: Abnormal   Collection Time   08/14/11 12:29 PM      Component Value Range Comment   Sodium 136  135 - 145 mEq/L    Potassium >7.5 (*) 3.5 - 5.1 mEq/L    Chloride 98  96 - 112 mEq/L    CO2 13 (*) 19 - 32 mEq/L    Glucose, Bld 108 (*) 70 - 99 mg/dL    BUN 454 (*) 6 - 23 mg/dL    Creatinine, Ser 09.81 (*) 0.50 - 1.35 mg/dL    Calcium 9.8  8.4 - 19.1 mg/dL    Total Protein 8.2  6.0 - 8.3 g/dL    Albumin 3.4 (*) 3.5 - 5.2 g/dL    AST 478 (*) 0 - 37 U/L    ALT 120 (*) 0 - 53 U/L    Alkaline Phosphatase 87  39 - 117 U/L    Total Bilirubin 0.4  0.3 - 1.2 mg/dL    GFR calc non Af Amer 4 (*) >90 mL/min    GFR calc Af Amer 5 (*) >90 mL/min   CK     Status: Abnormal   Collection Time   08/14/11 12:29 PM      Component Value Range Comment   Total CK 13842 (*) 7 - 232 U/L RESULTS CONFIRMED BY MANUAL DILUTION  LACTIC ACID, PLASMA     Status: Abnormal   Collection Time   08/14/11 12:29 PM      Component Value Range Comment   Lactic Acid, Venous 2.6 (*) 0.5 - 2.2 mmol/L     URINALYSIS, ROUTINE W REFLEX MICROSCOPIC     Status: Abnormal   Collection Time   08/14/11 12:39 PM      Component Value Range Comment   Color, Urine YELLOW  YELLOW    APPearance CLEAR  CLEAR    Specific Gravity, Urine 1.021  1.005 - 1.030    pH 5.0  5.0 - 8.0    Glucose, UA NEGATIVE  NEGATIVE mg/dL    Hgb urine dipstick LARGE (*) NEGATIVE    Bilirubin Urine SMALL (*) NEGATIVE    Ketones, ur NEGATIVE  NEGATIVE mg/dL    Protein, ur 30 (*) NEGATIVE mg/dL    Urobilinogen, UA 0.2  0.0 - 1.0 mg/dL    Nitrite NEGATIVE  NEGATIVE    Leukocytes, UA TRACE (*) NEGATIVE   URINE MICROSCOPIC-ADD ON     Status: Abnormal   Collection Time   08/14/11 12:39 PM      Component Value Range Comment   Squamous Epithelial / LPF FEW (*) RARE    WBC, UA 0-2  <3 WBC/hpf    RBC / HPF 0-2  <3 RBC/hpf    Bacteria, UA RARE  RARE    Urine-Other AMORPHOUS URATES/PHOSPHATES     TROPONIN I     Status: Abnormal   Collection Time   08/14/11 12:45 PM      Component Value Range Comment   Troponin I 0.30 (*) <0.30 ng/mL   CBC     Status: Abnormal   Collection Time   08/14/11  1:35 PM      Component Value Range Comment   WBC 13.5 (*) 4.0 - 10.5 K/uL    RBC 6.14 (*) 4.22 - 5.81 MIL/uL    Hemoglobin 16.8  13.0 - 17.0 g/dL    HCT 29.5  62.1 - 30.8 %    MCV 79.2  78.0 - 100.0 fL    MCH 27.4  26.0 - 34.0 pg    MCHC 34.6  30.0 - 36.0 g/dL    RDW 65.7 (*) 84.6 - 15.5 %    Platelets 179  150 - 400 K/uL   DIFFERENTIAL     Status: Abnormal   Collection Time  08/14/11  1:35 PM      Component Value Range Comment   Neutrophils Relative 76  43 - 77 %    Neutro Abs 10.3 (*) 1.7 - 7.7 K/uL    Lymphocytes Relative 10 (*) 12 - 46 %    Lymphs Abs 1.4  0.7 - 4.0 K/uL    Monocytes Relative 14 (*) 3 - 12 %    Monocytes Absolute 1.9 (*) 0.1 - 1.0 K/uL    Eosinophils Relative 0  0 - 5 %    Eosinophils Absolute 0.0  0.0 - 0.7 K/uL    Basophils Relative 0  0 - 1 %    Basophils Absolute 0.0  0.0 - 0.1 K/uL   BASIC METABOLIC PANEL      Status: Abnormal   Collection Time   08/14/11  3:00 PM      Component Value Range Comment   Sodium 137  135 - 145 mEq/L    Potassium >7.5 (*) 3.5 - 5.1 mEq/L    Chloride 98  96 - 112 mEq/L    CO2 14 (*) 19 - 32 mEq/L    Glucose, Bld 192 (*) 70 - 99 mg/dL    BUN 161 (*) 6 - 23 mg/dL    Creatinine, Ser 09.60 (*) 0.50 - 1.35 mg/dL    Calcium 9.3  8.4 - 45.4 mg/dL    GFR calc non Af Amer 4 (*) >90 mL/min    GFR calc Af Amer 5 (*) >90 mL/min    Dg Chest 1 View  08/14/2011  *RADIOLOGY REPORT*  Clinical Data: Injury  CHEST - 1 VIEW  Comparison: 02/11/2011  Findings: Mild cardiomegaly.  Clear lungs.  No definite acute bony deformity.  Lytic bone lesion in the proximal humerus is not significantly changed.  IMPRESSION: Cardiomegaly without edema.  No active cardiopulmonary disease.  Original Report Authenticated By: Donavan Burnet, M.D.   Dg Pelvis 1-2 Views  08/14/2011  *RADIOLOGY REPORT*  Clinical Data: Pelvic pain  PELVIS - 1-2 VIEW  Comparison: 03/23/2003  Findings: There is been previous acetabular reconstruction and ORIF of a proximal humeral fracture.  There is advanced degenerative arthropathy of the hip and the hip joint may be actually fused.  I do not see any acute finding in that region.  The right hip shows small marginal osteophytes but no significant disc space narrowing. Sacroiliac joints and symphysis pubis appear normal.  IMPRESSION: No acute finding.  Postsurgical and post-traumatic deformity of the left hip region with degenerative change and possible fusion.  Original Report Authenticated By: Thomasenia Sales, M.D.    Assessment:  1 Hyperkalemia due to ischemia 2 Acute Renal Failure due to dehydration and rhabdomyolysis 3 Metabolic acidosis, pos anion gap 4 Limb ischemia, LLE  Plan: 1 Emergent dialysis if medical therapy fails to control hyperkalemia 2 I have spoken to Dr. Darrick Penna.  I anticipate patient will require dialysis post op and a perm-cath placed in the OR is  advisable. 3 Amputation is planned  Nyashia Raney C 08/14/2011, 6:00 PM

## 2011-08-14 NOTE — ED Notes (Signed)
Pt here by ems from home pt reproted to fall three days ago and since then has been in bed and not moved, pt covered in dried urine on arrival. Pt has not taken meds in three days, pt complains of left hip pain with movment and palpation of left leg, left foot pulseless and cool to touch, dr Freida Busman aware immediately of this. Patchy Discoloration noted also. Pt has old hx of stroke with right side deficits.

## 2011-08-14 NOTE — H&P (Signed)
[facility] History and Physical  Shaylon Aden ZOX:[id number] DOB: Jul 04, 1955 DOA: 08/14/2011   PCP: Dorrene German, MD   Chief Complaint:  Chief Complaint  Patient presents with  . Fall     HPI:  56 year old gentleman who has suffered a stroke back in January, was brought in by his family for decreased level of consciousness and falls at home. He normally lives with his sister but he was visiting some other relatives up to yesterday. His family noticed that after return from the visit he was just more lethargic and weaker than before. The patient himself was left with significant aphasia after a stroke and he cannot provide history. In the emergency room the patient was found to have a cold left foot, acute renal failure, significant rhabdomyolysis and he was referred for admission.  Review of Systems:  Unobtainable. According to sister the patient complains of chronic left hip pain and chronic left knee pain   Past Medical History  Diagnosis Date  . Cardiomyopathy     EF 20-25% by echo 01/2010 with severe LVH felt secondary to substance abuse (no ischemic workup)  . 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 Diabetes mellitus   . HTN (hypertension)   . Polysubstance abuse     Reported hx of cocaine, THC, heroin, EtOH abuse  . Noncompliance    Past Surgical History  Procedure Date  . Total hip arthroplasty    Social History:  reports that he has been smoking Cigarettes.  He has a 7.5 pack-year smoking history. He uses smokeless tobacco. He reports that he drinks about one ounce of alcohol per week. He reports that he does not use illicit drugs.  Allergies  Allergen Reactions  . Almond Oil Shortness Of Breath and Swelling    Family History  Problem Relation Age of Onset  . Stroke Brother     Prior to Admission medications   Medication Sig Start Date End Date Taking? Authorizing Provider    aspirin 325 MG tablet Take 1 tablet (325 mg total) by mouth daily. 02/15/11 02/15/12 Yes Calvert Cantor, MD  atenolol (TENORMIN) 50 MG tablet Take 50 mg by mouth daily.   Yes Historical Provider, MD  carvedilol (COREG) 25 MG tablet Take 25 mg by mouth 2 (two) times daily with a meal.   Yes Historical Provider, MD  cloNIDine (CATAPRES) 0.1 MG tablet Take 0.1 mg by mouth 3 (three) times daily.   Yes Historical Provider, MD  HYDROcodone-acetaminophen (VICODIN) 5-500 MG per tablet Take 1 tablet by mouth every 6 (six) hours as needed. pain   Yes Historical Provider, MD  spironolactone (ALDACTONE) 25 MG tablet Take 25 mg by mouth daily.   Yes Historical Provider, MD  folic acid (FOLVITE) 1 MG tablet Take 1 mg by mouth daily.      Historical Provider, MD  furosemide (LASIX) 20 MG tablet Take 20 mg by mouth daily.      Historical Provider, MD   Physical Exam: Filed Vitals:   08/14/11 1345 08/14/11 1400 08/14/11 1415 08/14/11 1430  BP: 161/83 159/88 165/87 144/83  Pulse: 66 65 70 71  Temp:      TempSrc:      Resp:  10 10 15   SpO2: 98% 97% 98% 96%     General:  Patient is awake and follows commands is able to say a couple words, is able to say yes and no  Eyes: Pupil equal round react and accommodation  ENT: No pharyngeal exudate  Neck: No JVD  Cardiovascular: Regular rate and rhythm without murmurs rubs or gallops  Respiratory: Clear to auscultation bilaterally  Abdomen: Fairly firm, bowel sounds diminished, some guarding  Skin: Left foot has purplish discoloration with areas of black discoloration of the toes  Musculoskeletal: Significant diminished muscle mass throughout the body  Psychiatric: Essentially with vascular dementia  Neurologic: Barely able to move all 4 extremities due to generalized weakness  Labs on Admission:  Basic Metabolic Panel:  Lab 08/14/11 6213  NA 136  K >7.5*  CL 98  CO2 13*  GLUCOSE 108*  BUN 142*  CREATININE 12.00*  CALCIUM 9.8  MG --  PHOS --    Liver Function Tests:  Lab 08/14/11 1229  AST 232*  ALT 120*  ALKPHOS 87  BILITOT 0.4  PROT 8.2  ALBUMIN 3.4*   No results found for this basename: LIPASE:5,AMYLASE:5 in the last 168 hours No results found for this basename: AMMONIA:5 in the last 168 hours CBC:  Lab 08/14/11 1335  WBC 13.5*  NEUTROABS 10.3*  HGB 16.8  HCT 48.6  MCV 79.2  PLT 179   Cardiac Enzymes:  Lab 08/14/11 1245 08/14/11 1229  CKTOTAL -- 13842*  CKMB -- --  CKMBINDEX -- --  TROPONINI 0.30* --   BNP: No components found with this basename: POCBNP:5 CBG: No results found for this basename: GLUCAP:5 in the last 168 hours  Radiological Exams on Admission: Dg Chest 1 View  08/14/2011  *RADIOLOGY REPORT*  Clinical Data: Injury  CHEST - 1 VIEW  Comparison: 02/11/2011  Findings: Mild cardiomegaly.  Clear lungs.  No definite acute bony deformity.  Lytic bone lesion in the proximal humerus is not significantly changed.  IMPRESSION: Cardiomegaly without edema.  No active cardiopulmonary disease.  Original Report Authenticated By: Donavan Burnet, M.D.   Dg Pelvis 1-2 Views  08/14/2011  *RADIOLOGY REPORT*  Clinical Data: Pelvic pain  PELVIS - 1-2 VIEW  Comparison: 03/23/2003  Findings: There is been previous acetabular reconstruction and ORIF of a proximal humeral fracture.  There is advanced degenerative arthropathy of the hip and the hip joint may be actually fused.  I do not see any acute finding in that region.  The right hip shows small marginal osteophytes but no significant disc space narrowing. Sacroiliac joints and symphysis pubis appear normal.  IMPRESSION: No acute finding.  Postsurgical and post-traumatic deformity of the left hip region with degenerative change and possible fusion.  Original Report Authenticated By: Thomasenia Sales, M.D.    EKG: Independently reviewed. Initial EKG with peaked T waves, repeat EKG with results peaked T waves  Assessment/Plan Principal Problem:  *ARF (acute renal  failure) Active Problems:  Diabetes mellitus  Cardiomyopathy  Aphasia  Gangrene of foot  PVD (peripheral vascular disease)  Metabolic acidosis  CVA (cerebral infarction)  Rhabdomyolysis  Chronic systolic CHF (congestive heart failure)  HTN (hypertension)   1. Acute renal failure-most likely due to dehydration, rhabdomyolysis, in the setting of severe peripheral vascular disease and probably gangrene of the left foot. Patient is making urine. He does not seem to be in shock respiratory distress. We will initiate IV fluids of normal saline, IV bicarbonate and monitor closely  basic metabolic profile. Consultation with [facility] has been obtained 2. Hyperkalemia due to renal failure and rhabdomyolysis-patient was treated emergency room with Kayexalate, bicarbonate, dextrose, insulin and a repeat basic metabolic profile is pending. EKG shows resolution  of the peaked T waves 3. Severe peripheral vascular disease with gangrene of the left foot-emergent arterial Dopplers have been ordered. Emergent vascular surgical consultation has been obtained 4. Diabetes mellitus type 2-we'll proceed with CBGs every 4 hours in size chemistry NovoLog 5. Chronic systolic congestive heart failure-we'll monitor fluid status closely. We'll hold beta blockers for now  Code Status: Full Family Communication: [phone]  Disposition Plan: Vickii Chafe, MD  [facility] Pager [phone]  If 7PM-7AM, please contact night-coverage [email or web address] Password [facility] 08/14/2011, 3:32 PM

## 2011-08-14 NOTE — ED Notes (Signed)
Lab called critical value K greater than 7.5

## 2011-08-15 ENCOUNTER — Encounter (HOSPITAL_COMMUNITY)
Admission: EM | Disposition: A | Discharge status: Skilled Nursing Facility | Payer: Self-pay | Source: Home / Self Care | Attending: Internal Medicine

## 2011-08-15 ENCOUNTER — Encounter (HOSPITAL_COMMUNITY): Payer: Self-pay | Admitting: Anesthesiology

## 2011-08-15 ENCOUNTER — Inpatient Hospital Stay (HOSPITAL_COMMUNITY): Payer: Medicare Other | Admitting: Anesthesiology

## 2011-08-15 DIAGNOSIS — E119 Type 2 diabetes mellitus without complications: Secondary | ICD-10-CM

## 2011-08-15 DIAGNOSIS — N19 Unspecified kidney failure: Secondary | ICD-10-CM

## 2011-08-15 HISTORY — PX: AMPUTATION: SHX166

## 2011-08-15 LAB — CBC
HCT: 40 % (ref 39.0–52.0)
HCT: 39.4 % (ref 39.0–52.0)
Hemoglobin: 14.1 g/dL (ref 13.0–17.0)
Hemoglobin: 13.6 g/dL (ref 13.0–17.0)
MCHC: 35.3 g/dL (ref 30.0–36.0)
MCHC: 34.5 g/dL (ref 30.0–36.0)
RBC: 5.21 MIL/uL (ref 4.22–5.81)
RBC: 5.05 MIL/uL (ref 4.22–5.81)
WBC: 8.3 10*3/uL (ref 4.0–10.5)

## 2011-08-15 LAB — COMPREHENSIVE METABOLIC PANEL
ALT: 96 U/L — ABNORMAL HIGH (ref 0–53)
Calcium: 8.3 mg/dL — ABNORMAL LOW (ref 8.4–10.5)
Creatinine, Ser: 9.97 mg/dL — ABNORMAL HIGH (ref 0.50–1.35)
GFR calc Af Amer: 6 mL/min — ABNORMAL LOW (ref >90)
GFR calc non Af Amer: 5 mL/min — ABNORMAL LOW (ref >90)
Glucose, Bld: 137 mg/dL — ABNORMAL HIGH (ref 70–99)
Sodium: 149 mEq/L — ABNORMAL HIGH (ref 135–145)
Total Protein: 7 g/dL (ref 6.0–8.3)

## 2011-08-15 LAB — GLUCOSE, CAPILLARY
Glucose-Capillary: 121 mg/dL — ABNORMAL HIGH (ref 70–99)
Glucose-Capillary: 136 mg/dL — ABNORMAL HIGH (ref 70–99)
Glucose-Capillary: 149 mg/dL — ABNORMAL HIGH (ref 70–99)
Glucose-Capillary: 153 mg/dL — ABNORMAL HIGH (ref 70–99)
Glucose-Capillary: 166 mg/dL — ABNORMAL HIGH (ref 70–99)

## 2011-08-15 LAB — BASIC METABOLIC PANEL
BUN: 135 mg/dL — ABNORMAL HIGH (ref 6–23)
Chloride: 106 mEq/L (ref 96–112)
GFR calc Af Amer: 7 mL/min — ABNORMAL LOW (ref >90)
GFR calc non Af Amer: 6 mL/min — ABNORMAL LOW (ref >90)
Potassium: 4.3 mEq/L (ref 3.5–5.1)
Sodium: 149 mEq/L — ABNORMAL HIGH (ref 135–145)

## 2011-08-15 LAB — ABO/RH: ABO/RH(D): B POS

## 2011-08-15 LAB — TYPE AND SCREEN: ABO/RH(D): B POS

## 2011-08-15 LAB — PROTIME-INR: Prothrombin Time: 15.6 seconds — ABNORMAL HIGH (ref 11.6–15.2)

## 2011-08-15 LAB — MRSA PCR SCREENING: MRSA by PCR: NEGATIVE

## 2011-08-15 SURGERY — AMPUTATION, ABOVE KNEE
Anesthesia: General | Site: Leg Upper | Laterality: Left | Wound class: Contaminated

## 2011-08-15 MED ORDER — ETOMIDATE 2 MG/ML IV SOLN
INTRAVENOUS | Status: DC | PRN
  Administered 2011-08-15: 4 mg via INTRAVENOUS
  Administered 2011-08-15: 16 mg via INTRAVENOUS

## 2011-08-15 MED ORDER — GLYCOPYRROLATE 0.2 MG/ML IJ SOLN
INTRAMUSCULAR | Status: DC | PRN
  Administered 2011-08-15: .6 mg via INTRAVENOUS

## 2011-08-15 MED ORDER — LIDOCAINE HCL 1 % IJ SOLN
INTRAMUSCULAR | Status: DC | PRN
  Administered 2011-08-15: 40 mg via INTRADERMAL

## 2011-08-15 MED ORDER — ONDANSETRON HCL 4 MG/2ML IJ SOLN: 4.0000 mg | Freq: Four times a day (QID) | INTRAMUSCULAR | Status: DC | PRN

## 2011-08-15 MED ORDER — CEFAZOLIN SODIUM 1-5 GM-% IV SOLN
INTRAVENOUS | Status: AC
  Filled 2011-08-15: qty 50

## 2011-08-15 MED ORDER — METOPROLOL TARTRATE 1 MG/ML IV SOLN
2.0000 mg | INTRAVENOUS | Status: DC | PRN
  Filled 2011-08-15: qty 5

## 2011-08-15 MED ORDER — ACETAMINOPHEN 650 MG RE SUPP: 325.0000 mg | RECTAL | Status: DC | PRN

## 2011-08-15 MED ORDER — PANTOPRAZOLE SODIUM 40 MG PO TBEC
40.0000 mg | DELAYED_RELEASE_TABLET | Freq: Every day | ORAL | Status: DC
  Administered 2011-08-16 – 2011-08-19 (×4): 40 mg via ORAL
  Filled 2011-08-15 (×3): qty 1

## 2011-08-15 MED ORDER — MORPHINE SULFATE 2 MG/ML IJ SOLN
2.0000 mg | INTRAMUSCULAR | Status: DC | PRN
  Administered 2011-08-16 (×2): 4 mg via INTRAVENOUS
  Administered 2011-08-16: 2 mg via INTRAVENOUS
  Administered 2011-08-16: 4 mg via INTRAVENOUS
  Filled 2011-08-15: qty 2
  Filled 2011-08-15: qty 1
  Filled 2011-08-15 (×2): qty 2

## 2011-08-15 MED ORDER — LABETALOL HCL 5 MG/ML IV SOLN
10.0000 mg | INTRAVENOUS | Status: DC | PRN
  Administered 2011-08-16: 10 mg via INTRAVENOUS
  Filled 2011-08-15: qty 4

## 2011-08-15 MED ORDER — OXYCODONE HCL 5 MG PO TABS
5.0000 mg | ORAL_TABLET | ORAL | Status: DC | PRN
  Administered 2011-08-15 – 2011-08-19 (×7): 5 mg via ORAL
  Filled 2011-08-15 (×7): qty 1

## 2011-08-15 MED ORDER — ROCURONIUM BROMIDE 100 MG/10ML IV SOLN
INTRAVENOUS | Status: DC | PRN
  Administered 2011-08-15: 20 mg via INTRAVENOUS
  Administered 2011-08-15: 10 mg via INTRAVENOUS

## 2011-08-15 MED ORDER — SODIUM BICARBONATE 8.4 % IV SOLN
INTRAVENOUS | Status: DC
  Administered 2011-08-15 (×2): via INTRAVENOUS
  Filled 2011-08-15 (×5): qty 75

## 2011-08-15 MED ORDER — ACETAMINOPHEN 10 MG/ML IV SOLN: 1000.0000 mg | Freq: Once | INTRAVENOUS | Status: DC | PRN

## 2011-08-15 MED ORDER — DEXTROSE 5 % IV SOLN
1.5000 g | Freq: Two times a day (BID) | INTRAVENOUS | Status: AC
  Administered 2011-08-15: 1.5 g via INTRAVENOUS
  Filled 2011-08-15: qty 1.5

## 2011-08-15 MED ORDER — SODIUM CHLORIDE 0.9 % IV SOLN
INTRAVENOUS | Status: DC | PRN
  Administered 2011-08-15: 07:00:00 via INTRAVENOUS

## 2011-08-15 MED ORDER — FENTANYL CITRATE 0.05 MG/ML IJ SOLN
INTRAMUSCULAR | Status: DC | PRN
  Administered 2011-08-15 (×3): 50 ug via INTRAVENOUS

## 2011-08-15 MED ORDER — CARVEDILOL 3.125 MG PO TABS
3.1250 mg | ORAL_TABLET | Freq: Two times a day (BID) | ORAL | Status: DC
  Administered 2011-08-15 – 2011-08-16 (×2): 3.125 mg via ORAL
  Filled 2011-08-15 (×4): qty 1

## 2011-08-15 MED ORDER — ACETAMINOPHEN 325 MG PO TABS: 325.0000 mg | ORAL_TABLET | ORAL | Status: DC | PRN

## 2011-08-15 MED ORDER — MIDAZOLAM HCL 2 MG/2ML IJ SOLN: 1.0000 mg | INTRAMUSCULAR | Status: DC | PRN

## 2011-08-15 MED ORDER — ALUM & MAG HYDROXIDE-SIMETH 200-200-20 MG/5ML PO SUSP: 15.0000 mL | ORAL | Status: DC | PRN

## 2011-08-15 MED ORDER — SUCCINYLCHOLINE CHLORIDE 20 MG/ML IJ SOLN
INTRAMUSCULAR | Status: DC | PRN
  Administered 2011-08-15: 100 mg via INTRAVENOUS

## 2011-08-15 MED ORDER — CEFAZOLIN SODIUM 1-5 GM-% IV SOLN
INTRAVENOUS | Status: DC | PRN
  Administered 2011-08-15: 1 g via INTRAVENOUS

## 2011-08-15 MED ORDER — NEOSTIGMINE METHYLSULFATE 1 MG/ML IJ SOLN
INTRAMUSCULAR | Status: DC | PRN
  Administered 2011-08-15: 4 mg via INTRAVENOUS

## 2011-08-15 MED ORDER — DOCUSATE SODIUM 100 MG PO CAPS
100.0000 mg | ORAL_CAPSULE | Freq: Every day | ORAL | Status: DC
  Administered 2011-08-16 – 2011-08-19 (×4): 100 mg via ORAL
  Filled 2011-08-15 (×4): qty 1

## 2011-08-15 MED ORDER — ONDANSETRON HCL 4 MG/2ML IJ SOLN
INTRAMUSCULAR | Status: DC | PRN
  Administered 2011-08-15: 4 mg via INTRAVENOUS

## 2011-08-15 MED ORDER — HYDROMORPHONE HCL PF 1 MG/ML IJ SOLN: 0.2500 mg | INTRAMUSCULAR | Status: DC | PRN

## 2011-08-15 MED ORDER — ONDANSETRON HCL 4 MG/2ML IJ SOLN: 4.0000 mg | Freq: Once | INTRAMUSCULAR | Status: DC | PRN

## 2011-08-15 MED ORDER — FENTANYL CITRATE 0.05 MG/ML IJ SOLN: 50.0000 ug | INTRAMUSCULAR | Status: DC | PRN

## 2011-08-15 MED ORDER — PHENYLEPHRINE HCL 10 MG/ML IJ SOLN
INTRAMUSCULAR | Status: DC | PRN
  Administered 2011-08-15: 80 ug via INTRAVENOUS
  Administered 2011-08-15: 40 ug via INTRAVENOUS
  Administered 2011-08-15 (×5): 80 ug via INTRAVENOUS
  Administered 2011-08-15: 40 ug via INTRAVENOUS
  Administered 2011-08-15 (×3): 80 ug via INTRAVENOUS

## 2011-08-15 MED ORDER — BIOTENE DRY MOUTH MT LIQD
15.0000 mL | Freq: Two times a day (BID) | OROMUCOSAL | Status: DC
  Administered 2011-08-15 – 2011-08-18 (×6): 15 mL via OROMUCOSAL

## 2011-08-15 MED ORDER — HYDRALAZINE HCL 20 MG/ML IJ SOLN
10.0000 mg | INTRAMUSCULAR | Status: DC | PRN
  Filled 2011-08-15 (×2): qty 0.5

## 2011-08-15 MED ORDER — HEPARIN SODIUM (PORCINE) 5000 UNIT/ML IJ SOLN
5000.0000 [IU] | Freq: Three times a day (TID) | INTRAMUSCULAR | Status: DC
  Administered 2011-08-15 – 2011-08-19 (×12): 5000 [IU] via SUBCUTANEOUS
  Filled 2011-08-15 (×14): qty 1

## 2011-08-15 SURGICAL SUPPLY — 45 items
ARROW STEAM TAMPER FOR GENESI (STERILIZATION PRODUCTS) IMPLANT
BANDAGE ELASTIC 6 VELCRO ST LF (GAUZE/BANDAGES/DRESSINGS) ×2 IMPLANT
BANDAGE GAUZE ELAST BULKY 4 IN (GAUZE/BANDAGES/DRESSINGS) ×2 IMPLANT
BLADE SAW RECIP 87.9 MT (BLADE) ×2 IMPLANT
BNDG COHESIVE 4X5 TAN STRL (GAUZE/BANDAGES/DRESSINGS) ×2 IMPLANT
BNDG COHESIVE 6X5 TAN STRL LF (GAUZE/BANDAGES/DRESSINGS) ×2 IMPLANT
CANISTER SUCTION 2500CC (MISCELLANEOUS) ×2 IMPLANT
CATH CANNON HEMO 15FR 19 (HEMODIALYSIS SUPPLIES) ×2 IMPLANT
CLIP TI MEDIUM 6 (CLIP) ×2 IMPLANT
CLOTH BEACON ORANGE TIMEOUT ST (SAFETY) ×2 IMPLANT
COVER SURGICAL LIGHT HANDLE (MISCELLANEOUS) ×4 IMPLANT
COVER TABLE BACK 60X90 (DRAPES) ×2 IMPLANT
DRAIN CHANNEL 19F RND (DRAIN) IMPLANT
DRAPE ORTHO SPLIT 77X108 STRL (DRAPES) ×2
DRAPE PROXIMA HALF (DRAPES) ×2 IMPLANT
DRAPE SURG ORHT 6 SPLT 77X108 (DRAPES) ×2 IMPLANT
DRSG ADAPTIC 3X8 NADH LF (GAUZE/BANDAGES/DRESSINGS) ×2 IMPLANT
ELECT REM PT RETURN 9FT ADLT (ELECTROSURGICAL) ×2
ELECTRODE REM PT RTRN 9FT ADLT (ELECTROSURGICAL) ×1 IMPLANT
EVACUATOR SILICONE 100CC (DRAIN) IMPLANT
GLOVE BIO SURGEON STRL SZ 6.5 (GLOVE) ×6 IMPLANT
GLOVE BIO SURGEON STRL SZ7.5 (GLOVE) ×8 IMPLANT
GLOVE BIOGEL PI IND STRL 7.5 (GLOVE) ×1 IMPLANT
GLOVE BIOGEL PI INDICATOR 7.5 (GLOVE) ×1
GOWN PREVENTION PLUS XLARGE (GOWN DISPOSABLE) ×2 IMPLANT
GOWN STRL NON-REIN LRG LVL3 (GOWN DISPOSABLE) ×6 IMPLANT
KIT BASIN OR (CUSTOM PROCEDURE TRAY) ×2 IMPLANT
KIT ROOM TURNOVER OR (KITS) ×2 IMPLANT
NS IRRIG 1000ML POUR BTL (IV SOLUTION) ×2 IMPLANT
PACK GENERAL/GYN (CUSTOM PROCEDURE TRAY) ×2 IMPLANT
PAD ARMBOARD 7.5X6 YLW CONV (MISCELLANEOUS) ×4 IMPLANT
SPONGE GAUZE 4X4 12PLY (GAUZE/BANDAGES/DRESSINGS) ×2 IMPLANT
STAPLER VISISTAT 35W (STAPLE) ×2 IMPLANT
STOCKINETTE IMPERVIOUS LG (DRAPES) ×2 IMPLANT
SUT ETHILON 3 0 PS 1 (SUTURE) IMPLANT
SUT SILK 2 0 SH (SUTURE) IMPLANT
SUT SILK 2 0 SH CR/8 (SUTURE) ×4 IMPLANT
SUT SILK 2 0 TIES 10X30 (SUTURE) ×2 IMPLANT
SUT VIC AB 2-0 SH 18 (SUTURE) ×4 IMPLANT
SUT VIC AB 3-0 SH 27 (SUTURE) ×2
SUT VIC AB 3-0 SH 27X BRD (SUTURE) ×2 IMPLANT
TOWEL OR 17X24 6PK STRL BLUE (TOWEL DISPOSABLE) ×2 IMPLANT
TOWEL OR 17X26 10 PK STRL BLUE (TOWEL DISPOSABLE) ×2 IMPLANT
UNDERPAD 30X30 INCONTINENT (UNDERPADS AND DIAPERS) ×2 IMPLANT
WATER STERILE IRR 1000ML POUR (IV SOLUTION) ×2 IMPLANT

## 2011-08-15 NOTE — Op Note (Signed)
[facility] OPERATIVE NOTE    Procedure: Left above knee amputation  Surgeon(s): Sherren Kerns, MD  ASSISTANT: Towanda Octave, RNFA  Anesthesia: General  Specimens: Left leg  PROCEDURE DETAIL: After obtaining informed consent, the patient was taken to the operating room. The patient was placed in supine position the operating room table. After induction of general anesthesia and endotracheal intubation the patient's entire left lower extremity was prepped and draped in usual sterile fashion. A circumferential incision was made on the left leg just above the knee. The incision was carried down into the sucutaneous tissues down to level the saphenous vein. This was ligated and divided between silk ties. Soft tissues were taken down as well as the muscle and fascia with cautery. The superficial femoral artery and vein were dissected free circumferentially clamped and divided. These were suture ligated proximally. Remainder of the soft tissues were taken down with cautery. The periosteum was raised on the femur approximately 5 cm above the skin edge. The femur was divided at this level. The leg was passed off the table as a specimen. Hemostasis was obtained. The wound was thoroughly irrigated with normal saline solution. The fascial edges were reapproximated using interrupted 2 0 Vicryl sutures. The subcutaneous tissues reapproximated using a running 3-0 Vicryl suture. The skin was closed staples. Patient tolerated procedure well and there were no complications. Instrument sponge and needle counts correct in the case. Patient was taken to recovery in stable condition.  [name], MD [facility] Office: [phone] Pager: [phone]

## 2011-08-15 NOTE — Preoperative (Signed)
Beta Blockers   Reason not to administer Beta Blockers:Hold beta blocker due to other -pt unable to verbalize medication allergies per chart. Family unavailable. Primary MD hold meds at this time.

## 2011-08-15 NOTE — Progress Notes (Signed)
2100: Pt unable to tell this nurse if MD has spoken to him about surgery. Pt mental status unable to be assess, pt only answers "yes, no, i don't know". Read MD noted, pt has expressive aphasia.  Pt also swears. Unable to obtain consent from pt. Jeremy Coons, NP notified of trending BP's 160-190/80-110. Orders received. Will continue to monitor closely.

## 2011-08-15 NOTE — Transfer of Care (Signed)
Immediate Anesthesia Transfer of Care Note  Patient: Jeremy Howell  Procedure(s) Performed: Procedure(s) (LRB): AMPUTATION ABOVE KNEE (Left)  Patient Location: PACU  Anesthesia Type: General  Level of Consciousness: awake, alert , oriented and patient cooperative  Airway & Oxygen Therapy: Patient Spontanous Breathing and Patient connected to face mask oxygen  Post-op Assessment: Report given to PACU RN and Post -op Vital signs reviewed and stable  Post vital signs: Reviewed and stable  Complications: No apparent anesthesia complications

## 2011-08-15 NOTE — Anesthesia Postprocedure Evaluation (Signed)
  Anesthesia Post-op Note  Patient: Jeremy Howell  Procedure(s) Performed: Procedure(s) (LRB): AMPUTATION ABOVE KNEE (Left)  Patient Location: PACU  Anesthesia Type: General  Level of Consciousness: awake and confused  Airway and Oxygen Therapy: Patient Spontanous Breathing and Patient connected to nasal cannula oxygen  Post-op Pain: none  Post-op Assessment: Post-op Vital signs reviewed, Patient's Cardiovascular Status Stable and RESPIRATORY FUNCTION UNSTABLE  Post-op Vital Signs: stable  Complications: No apparent anesthesia complications

## 2011-08-15 NOTE — Progress Notes (Signed)
[facility] PROGRESS NOTE  Welden Hausmann NWG:[id number] DOB: 08-06-1955 DOA: 08/14/2011 PCP: Dorrene German, MD  Assessment/Plan: 1. Acute renal failure-most likely due to dehydration, rhabdomyolysis, in the setting of critical limb ischemia and  gangrene of the left foot. Patient is making urine. He does not seem to be in shock or respiratory distress. Patient was started on iv NS and iv bicarbonate from admission .  Continue to monitor. Appreciate management by Dr. Lowell Guitar 2. Hyperkalemia k>7.5 on admission due to renal failure and rhabdomyolysis-patient was treated in  emergency room with Kayexalate, bicarbonate, dextrose, insulin and a repeat basic metabolic profile continued to show elevated potassium. Bicarbonate, dextrose and insulin were given again and we were able to control the hyperkalemia without need for HD 3. 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 . Emergent vascular surgical consultation has been obtained on 08/14/11 and the patient was taken to OR for AKA on 08/15/11 after his metabolic abnormalities have been corrected.  4. Diabetes mellitus type 2- NovoLog SSI  5. Chronic systolic congestive heart failure-we'll monitor fluid status closely. Coreg 3.125 Bid resumed.     Principal Problem:  *ARF (acute renal failure) Active Problems:  Diabetes mellitus  Cardiomyopathy  Aphasia  Gangrene of foot  PVD (peripheral vascular disease)  Metabolic acidosis  CVA (cerebral infarction)  Rhabdomyolysis  Chronic systolic CHF (congestive heart failure)  HTN (hypertension)  Code Status: Full  Family Communication: [phone]  Disposition Plan: Vickii Chafe, MD  [facility] Pager [phone]  If 7PM-7AM, please contact night-coverage [email or web address] Password [email or web address] 08/15/2011, 12:23 PM   LOS: 1 day      Consultants:  VVS  CKA  Procedures:  Left AKA 7/6  Antibiotics:    HPI/Subjective: Some mild post op pain  Objective: Filed Vitals:   08/15/11 1015 08/15/11 1017 08/15/11 1051 08/15/11 1100  BP:   146/74 136/73  Pulse: 81 80 77 79  Temp: 97.4 F (36.3 C)  97.3 F (36.3 C)   TempSrc:   Oral   Resp: 12 11 12 10   Height:      Weight:      SpO2: 96% 96% 97% 98%    Intake/Output Summary (Last 24 hours) at 08/15/11 1223 Last data filed at 08/15/11 1136  Gross per 24 hour  Intake   2150 ml  Output   1465 ml  Net    685 ml    Exam:   General:  axox3  Cardiovascular: RRR  Respiratory: CTAB  Abdomen: remains firm with guargding  Data Reviewed: Basic Metabolic Panel:  Lab 08/15/11 2952 08/14/11 1833 08/14/11 1500 08/14/11 1229  NA 149* 144 137 136  K 4.1 5.4* >7.5* >7.5*  CL 106 102 98 98  CO2 22 18* 14* 13*  GLUCOSE 137* 120* 192* 108*  BUN 142* 147* 150* 142*  CREATININE 9.97* 11.68* 11.91* 12.00*  CALCIUM 8.3* 9.3 9.3 9.8  MG -- -- -- --  PHOS -- -- -- --   Liver Function Tests:  Lab 08/15/11 0430 08/14/11 1229  AST 160* 232*  ALT 96* 120*  ALKPHOS 74 87  BILITOT 0.3 0.4  PROT 7.0 8.2  ALBUMIN 2.9* 3.4*   No results found for this basename: LIPASE:5,AMYLASE:5 in the last 168 hours No results found for this basename: AMMONIA:5 in the last 168 hours CBC:  Lab 08/15/11 1130 08/15/11 0430 08/14/11 1335  WBC 8.3 9.5  13.5*  NEUTROABS -- -- 10.3*  HGB 13.6 14.1 16.8  HCT 39.4 40.0 48.6  MCV 78.0 76.8* 79.2  PLT 182 193 179   Cardiac Enzymes:  Lab 08/14/11 1245 08/14/11 1229  CKTOTAL -- 13842*  CKMB -- --  CKMBINDEX -- --  TROPONINI 0.30* --   BNP (last 3 results)  Basename 02/14/11 0500 02/13/11 0830  PROBNP 104.1 182.0*   CBG:  Lab 08/15/11 0935 08/15/11 0017  GLUCAP 153* 149*    Recent Results (from the past 240 hour(s))  MRSA PCR SCREENING     Status: Normal   Collection Time   08/14/11  9:41 PM      Component  Value Range Status Comment   MRSA by PCR NEGATIVE  NEGATIVE Final      Studies: Dg Chest 1 View  08/14/2011  *RADIOLOGY REPORT*  Clinical Data: Injury  CHEST - 1 VIEW  Comparison: 02/11/2011  Findings: Mild cardiomegaly.  Clear lungs.  No definite acute bony deformity.  Lytic bone lesion in the proximal humerus is not significantly changed.  IMPRESSION: Cardiomegaly without edema.  No active cardiopulmonary disease.  Original Report Authenticated By: Donavan Burnet, M.D.   Dg Pelvis 1-2 Views  08/14/2011  *RADIOLOGY REPORT*  Clinical Data: Pelvic pain  PELVIS - 1-2 VIEW  Comparison: 03/23/2003  Findings: There is been previous acetabular reconstruction and ORIF of a proximal humeral fracture.  There is advanced degenerative arthropathy of the hip and the hip joint may be actually fused.  I do not see any acute finding in that region.  The right hip shows small marginal osteophytes but no significant disc space narrowing. Sacroiliac joints and symphysis pubis appear normal.  IMPRESSION: No acute finding.  Postsurgical and post-traumatic deformity of the left hip region with degenerative change and possible fusion.  Original Report Authenticated By: Thomasenia Sales, M.D.    Scheduled Meds:   . aspirin  325 mg Oral Daily  . calcium gluconate 1 GM IV  1 g Intravenous Once  . calcium gluconate      . cefUROXime (ZINACEF)  IV  1.5 g Intravenous Q12H  . dextrose  1 ampule Intravenous Once  . dextrose  1 ampule Intravenous Once  . docusate sodium  100 mg Oral Daily  . folic acid  1 mg Oral Daily  . heparin  5,000 Units Subcutaneous Q8H  . insulin aspart  10 Units Subcutaneous Once  . insulin aspart  10 Units Intravenous Once  . pantoprazole  40 mg Oral Q1200  . sodium bicarbonate  100 mEq Intravenous Once  . sodium bicarbonate  50 mEq Intravenous Once  . sodium chloride  500 mL Intravenous Once  . sodium chloride  3 mL Intravenous Q12H  . sodium polystyrene  30 g Oral Once  . sodium polystyrene   50 g Oral Once  . DISCONTD: sodium chloride   Intravenous STAT  . DISCONTD: heparin  5,000 Units Subcutaneous Q8H   Continuous Infusions:   .  sodium bicarbonate infusion 1000 mL    . DISCONTD: sodium chloride 150 mL/hr at 08/15/11 0550  . DISCONTD:  sodium bicarbonate infusion 1000 mL 100 mL/hr at 08/14/11 1732

## 2011-08-15 NOTE — Anesthesia Procedure Notes (Signed)
Procedure Name: Intubation Date/Time: 08/15/2011 7:42 AM Performed by: Leona Singleton A Pre-anesthesia Checklist: Patient identified Patient Re-evaluated:Patient Re-evaluated prior to inductionOxygen Delivery Method: Circle system utilized Preoxygenation: Pre-oxygenation with 100% oxygen Intubation Type: IV induction and Cricoid Pressure applied Ventilation: Mask ventilation without difficulty Laryngoscope Size: Miller and 2 Grade View: Grade I Tube type: Oral Tube size: 7.5 mm Number of attempts: 1 Airway Equipment and Method: Stylet Placement Confirmation: ETT inserted through vocal cords under direct vision,  positive ETCO2 and breath sounds checked- equal and bilateral Secured at: 21 cm Tube secured with: Tape Dental Injury: Teeth and Oropharynx as per pre-operative assessment

## 2011-08-15 NOTE — Progress Notes (Signed)
Assessment:  1 Hyperkalemia due to ischemia, treated  2 Acute Renal Failure due to dehydration and rhabdomyolysis  3 Metabolic acidosis, pos anion gap  4 Limb ischemia, LLE; s/p Amputation  Plan:  1 IVF.   2 Hold on HD catheter since pt making urine.   Subjective: Interval History: s/p AKA on left  Objective: Vital signs in last 24 hours: Temp:  [97.3 F (36.3 C)-98.3 F (36.8 C)] 97.3 F (36.3 C) (07/06 1051) Pulse Rate:  [64-88] 77  (07/06 1051) Resp:  [10-20] 12  (07/06 1051) BP: (109-194)/(70-111) 146/74 mmHg (07/06 1051) SpO2:  [85 %-100 %] 97 % (07/06 1051) Weight:  [59.5 kg (131 lb 2.8 oz)] 59.5 kg (131 lb 2.8 oz) (07/05 1831) Weight change:   Intake/Output from previous day: 07/05 0701 - 07/06 0700 In: 1450 [I.V.:1450] Out: 1075 [Urine:1075] Intake/Output this shift: Total I/O In: 700 [I.V.:700] Out: 240 [Urine:140; Blood:100]  General appearance: no distress and slowed mentation Resp: clear to auscultation bilaterally GI: soft, non-tender; bowel sounds normal; no masses,  no organomegaly Extremities: left AKA post op, rle w/o edema  Lab Results:  Basename 08/15/11 0430 08/14/11 1335  WBC 9.5 13.5*  HGB 14.1 16.8  HCT 40.0 48.6  PLT 193 179   BMET:  Basename 08/15/11 0430 08/14/11 1833  NA 149* 144  K 4.1 5.4*  CL 106 102  CO2 22 18*  GLUCOSE 137* 120*  BUN 142* 147*  CREATININE 9.97* 11.68*  CALCIUM 8.3* 9.3   No results found for this basename: PTH:2 in the last 72 hours Iron Studies: No results found for this basename: IRON,TIBC,TRANSFERRIN,FERRITIN in the last 72 hours Studies/Results: Dg Chest 1 View  08/14/2011  *RADIOLOGY REPORT*  Clinical Data: Injury  CHEST - 1 VIEW  Comparison: 02/11/2011  Findings: Mild cardiomegaly.  Clear lungs.  No definite acute bony deformity.  Lytic bone lesion in the proximal humerus is not significantly changed.  IMPRESSION: Cardiomegaly without edema.  No active cardiopulmonary disease.  Original Report  Authenticated By: Donavan Burnet, M.D.   Dg Pelvis 1-2 Views  08/14/2011  *RADIOLOGY REPORT*  Clinical Data: Pelvic pain  PELVIS - 1-2 VIEW  Comparison: 03/23/2003  Findings: There is been previous acetabular reconstruction and ORIF of a proximal humeral fracture.  There is advanced degenerative arthropathy of the hip and the hip joint may be actually fused.  I do not see any acute finding in that region.  The right hip shows small marginal osteophytes but no significant disc space narrowing. Sacroiliac joints and symphysis pubis appear normal.  IMPRESSION: No acute finding.  Postsurgical and post-traumatic deformity of the left hip region with degenerative change and possible fusion.  Original Report Authenticated By: Thomasenia Sales, M.D.    Scheduled:   . sodium chloride   Intravenous Once  . aspirin  325 mg Oral Daily  . calcium gluconate 1 GM IV  1 g Intravenous Once  . calcium gluconate      . cefUROXime (ZINACEF)  IV  1.5 g Intravenous Q12H  . dextrose  1 ampule Intravenous Once  . dextrose  1 ampule Intravenous Once  . docusate sodium  100 mg Oral Daily  . folic acid  1 mg Oral Daily  . heparin  5,000 Units Subcutaneous Q8H  . insulin aspart  10 Units Subcutaneous Once  . insulin aspart  10 Units Intravenous Once  . pantoprazole  40 mg Oral Q1200  . sodium bicarbonate  100 mEq Intravenous Once  . sodium  bicarbonate  50 mEq Intravenous Once  . sodium chloride  500 mL Intravenous Once  . sodium chloride  3 mL Intravenous Q12H  . sodium polystyrene  30 g Oral Once  . sodium polystyrene  50 g Oral Once  . DISCONTD: sodium chloride   Intravenous STAT  . DISCONTD: heparin  5,000 Units Subcutaneous Q8H     LOS: 1 day   Burwell Bethel C 08/15/2011,11:33 AM

## 2011-08-15 NOTE — Progress Notes (Signed)
For left AKA and diatek today Hyperkalemia improved Left foot severely ischemic  .[name], MD [facility] Office: [phone] Pager: [phone]

## 2011-08-15 NOTE — Anesthesia Preprocedure Evaluation (Addendum)
Anesthesia Evaluation  Patient identified by MRN, date of birth, ID band Patient confused    Reviewed: Allergy & Precautions, H&P , NPO status   Airway Mallampati: III TM Distance: >3 FB Neck ROM: Full    Dental  (+) Poor Dentition and Dental Advisory Given   Pulmonary Current Smoker,    + decreased breath sounds      Cardiovascular Exercise Tolerance: Poor hypertension, Pt. 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, THC, heroin, EtOH abuse  )  alcohol use, cocaine use and marijuana use,   Endo/Other  Diabetes mellitus-, Poorly Controlled, Type 2  Renal/GU ESRFRenal diseasePt admit with 1 Hyperkalemia due to ischemia 2 Acute Renal Failure due to dehydration and rhabdomyolysis 3 Metabolic acidosis, pos anion gap 4 Limb ischemia, LLE      Musculoskeletal   Abdominal   Peds  Hematology   Anesthesia Other Findings   Reproductive/Obstetrics                      Anesthesia Physical Anesthesia Plan  ASA: IV and Emergent  Anesthesia Plan: General   Post-op Pain Management:    Induction: Intravenous  Airway Management Planned: Oral ETT  Additional Equipment:   Intra-op Plan:   Post-operative Plan: Extubation in OR and Possible Post-op intubation/ventilation  Informed Consent: I have reviewed the patients History and Physical, chart,  labs and discussed the procedure including the risks, benefits and alternatives for the proposed anesthesia with the patient or authorized representative who has indicated his/her understanding and acceptance.     Plan Discussed with:   Anesthesia Plan Comments: (Gangrene L. LE Renal Failure has received kayexylate k-4.1 S/P CVA L. MCA 01/28/10 with aphasia Type 2 DM glucose 139 H/O CHF EF 20-25% Severe Hypertension  Plan GA with ETT,   Kipp Brood, MD  )        Anesthesia Quick Evaluation

## 2011-08-16 LAB — GLUCOSE, CAPILLARY
Glucose-Capillary: 107 mg/dL — ABNORMAL HIGH (ref 70–99)
Glucose-Capillary: 112 mg/dL — ABNORMAL HIGH (ref 70–99)
Glucose-Capillary: 150 mg/dL — ABNORMAL HIGH (ref 70–99)
Glucose-Capillary: 155 mg/dL — ABNORMAL HIGH (ref 70–99)

## 2011-08-16 LAB — BASIC METABOLIC PANEL
Calcium: 7.7 mg/dL — ABNORMAL LOW (ref 8.4–10.5)
Creatinine, Ser: 5.59 mg/dL — ABNORMAL HIGH (ref 0.50–1.35)
GFR calc Af Amer: 12 mL/min — ABNORMAL LOW (ref >90)
GFR calc non Af Amer: 10 mL/min — ABNORMAL LOW (ref >90)
Sodium: 149 mEq/L — ABNORMAL HIGH (ref 135–145)

## 2011-08-16 LAB — CBC
Platelets: 173 10*3/uL (ref 150–400)
RBC: 4.78 MIL/uL (ref 4.22–5.81)
RDW: 15.2 % (ref 11.5–15.5)
WBC: 7.5 10*3/uL (ref 4.0–10.5)

## 2011-08-16 MED ORDER — CARVEDILOL 6.25 MG PO TABS
6.2500 mg | ORAL_TABLET | Freq: Two times a day (BID) | ORAL | Status: DC
  Administered 2011-08-16 – 2011-08-17 (×2): 6.25 mg via ORAL
  Filled 2011-08-16 (×4): qty 1

## 2011-08-16 MED ORDER — SODIUM CHLORIDE 0.45 % IV SOLN
INTRAVENOUS | Status: DC
  Administered 2011-08-16 (×3): via INTRAVENOUS

## 2011-08-16 MED ORDER — INSULIN ASPART 100 UNIT/ML ~~LOC~~ SOLN
0.0000 [IU] | Freq: Three times a day (TID) | SUBCUTANEOUS | Status: DC
  Administered 2011-08-17: 1 [IU] via SUBCUTANEOUS

## 2011-08-16 MED ORDER — INSULIN GLARGINE 100 UNIT/ML ~~LOC~~ SOLN
5.0000 [IU] | Freq: Every day | SUBCUTANEOUS | Status: DC
  Administered 2011-08-16 – 2011-08-18 (×3): 5 [IU] via SUBCUTANEOUS

## 2011-08-16 NOTE — Progress Notes (Addendum)
CSW consulted to meet with patient to access for SNF. At this time patient is waiting to meet with OT and Physical Therapy to decide if SNF is an appropriate referral. Will pursue SNF if needed. Please refer to CSW if psychosocial concerns arise.  Lia Foyer, LCSWA [facility] Clinical Social Worker Contact #: [phone] (weekend)

## 2011-08-16 NOTE — Progress Notes (Signed)
[facility] PROGRESS NOTE  Jamori Biggar AVW:[id number] DOB: 04-22-1955 DOA: 08/14/2011 PCP: Dorrene German, MD  Assessment/Plan: 1. Acute renal failure-most likely due to dehydration, rhabdomyolysis, in the setting of critical limb ischemia and  gangrene of the left leg and  foot. Patient is making urine. He does not seem to be in shock or respiratory distress. Patient was started on iv NS and iv bicarbonate from admission and received these type of fluids for 48 hrs.  Switched to 1/2 NS on 08/16/11 Continue to monitor. Appreciate management by Dr. Lowell Guitar 2. Hyperkalemia k>7.5 on admission due to renal failure and rhabdomyolysis-patient was treated in  emergency room with Kayexalate, bicarbonate, dextrose, insulin and a repeat basic metabolic profile continued to show elevated potassium. Bicarbonate, dextrose and insulin were given again and we were able to control the hyperkalemia without need for HD 3. 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 . Emergent vascular surgical consultation has been obtained on 08/14/11 and the patient was taken to OR for AKA on 08/15/11 after his metabolic abnormalities have been corrected. Will need PT?OT eval and rehab post amputation  4. Diabetes mellitus type 2- NovoLog SSI . Added lantus 5 units qhs 08/16/11  5. Chronic systolic congestive heart failure-we'll monitor fluid status closely. Coreg 3.125 Bid resumed on 08/15/11. Titrate up Coreg to 6/25 BID 7/7.     Principal Problem:  *ARF (acute renal failure) Active Problems:  Diabetes mellitus  Cardiomyopathy  Aphasia  Gangrene of foot  PVD (peripheral vascular disease)  Metabolic acidosis  CVA (cerebral infarction)  Rhabdomyolysis  Chronic systolic CHF (congestive heart failure)  HTN (hypertension)  Code Status: Full  Family Communication: [phone]  Disposition  Plan: Vickii Chafe, MD  [facility] Pager [phone]  If 7PM-7AM, please contact night-coverage [email or web address] Password [email or web address] 08/16/2011, 7:57 AM   LOS: 2 days     Consultants:  VVS  CKA  Procedures:  Left AKA 7/6  Antibiotics:    HPI/Subjective: Feels great  Objective: Filed Vitals:   08/15/11 2007 08/15/11 2354 08/16/11 0402 08/16/11 0700  BP: 141/78 141/84 170/77 148/72  Pulse: 81 74 83 78  Temp: 98.6 F (37 C) 99.3 F (37.4 C) 98.3 F (36.8 C)   TempSrc: Oral Oral Oral   Resp: 12 13 12 12   Height:      Weight:   55 kg (121 lb 4.1 oz)   SpO2: 97% 97% 95% 94%    Intake/Output Summary (Last 24 hours) at 08/16/11 0757 Last data filed at 08/16/11 0700  Gross per 24 hour  Intake   3560 ml  Output   2040 ml  Net   1520 ml    Exam:   Alert able to follow simple commands and answer yes/no   CVS: RRR  RS: CTAB  Abdomen : firm Non tender  Data Reviewed: Basic Metabolic Panel:  Lab 08/16/11 3086 08/15/11 1130 08/15/11 0430 08/14/11 1833 08/14/11 1500  NA 149* 149* 149* 144 137  K 3.5 4.3 4.1 5.4* >7.5*  CL 104 106 106 102 98  CO2 31 25 22  18* 14*  GLUCOSE 157* 138* 137* 120* 192*  BUN 106* 135* 142* 147* 150*  CREATININE 5.59* 8.69* 9.97* 11.68* 11.91*  CALCIUM 7.7* 7.8* 8.3* 9.3 9.3  MG -- -- -- -- --  PHOS -- -- -- -- --   Liver Function Tests:  Lab 08/15/11 0430 08/14/11  1229  AST 160* 232*  ALT 96* 120*  ALKPHOS 74 87  BILITOT 0.3 0.4  PROT 7.0 8.2  ALBUMIN 2.9* 3.4*   No results found for this basename: LIPASE:5,AMYLASE:5 in the last 168 hours No results found for this basename: AMMONIA:5 in the last 168 hours CBC:  Lab 08/16/11 0415 08/15/11 1130 08/15/11 0430 08/14/11 1335  WBC 7.5 8.3 9.5 13.5*  NEUTROABS -- -- -- 10.3*  HGB 13.0 13.6 14.1 16.8  HCT 37.3* 39.4 40.0 48.6  MCV 78.0 78.0 76.8* 79.2  PLT 173 182 193 179   Cardiac Enzymes:  Lab 08/14/11 1245 08/14/11 1229  CKTOTAL -- 13842*    CKMB -- --  CKMBINDEX -- --  TROPONINI 0.30* --   BNP (last 3 results)  Basename 02/14/11 0500 02/13/11 0830  PROBNP 104.1 182.0*   CBG:  Lab 08/16/11 0604 08/16/11 0012 08/15/11 1724 08/15/11 1133 08/15/11 0935  GLUCAP 150* 155* 166* 121* 153*    Recent Results (from the past 240 hour(s))  URINE CULTURE     Status: Normal   Collection Time   08/14/11 12:39 PM      Component Value Range Status Comment   Specimen Description URINE, CATHETERIZED   Final    Special Requests NONE   Final    Culture  Setup Time 08/14/2011 13:16   Final    Colony Count NO GROWTH   Final    Culture NO GROWTH   Final    Report Status 08/15/2011 FINAL   Final   MRSA PCR SCREENING     Status: Normal   Collection Time   08/14/11  9:41 PM      Component Value Range Status Comment   MRSA by PCR NEGATIVE  NEGATIVE Final      Studies: Dg Chest 1 View  08/14/2011  *RADIOLOGY REPORT*  Clinical Data: Injury  CHEST - 1 VIEW  Comparison: 02/11/2011  Findings: Mild cardiomegaly.  Clear lungs.  No definite acute bony deformity.  Lytic bone lesion in the proximal humerus is not significantly changed.  IMPRESSION: Cardiomegaly without edema.  No active cardiopulmonary disease.  Original Report Authenticated By: Donavan Burnet, M.D.   Dg Pelvis 1-2 Views  08/14/2011  *RADIOLOGY REPORT*  Clinical Data: Pelvic pain  PELVIS - 1-2 VIEW  Comparison: 03/23/2003  Findings: There is been previous acetabular reconstruction and ORIF of a proximal humeral fracture.  There is advanced degenerative arthropathy of the hip and the hip joint may be actually fused.  I do not see any acute finding in that region.  The right hip shows small marginal osteophytes but no significant disc space narrowing. Sacroiliac joints and symphysis pubis appear normal.  IMPRESSION: No acute finding.  Postsurgical and post-traumatic deformity of the left hip region with degenerative change and possible fusion.  Original Report Authenticated By: Thomasenia Sales, M.D.    Scheduled Meds:    . antiseptic oral rinse  15 mL Mouth Rinse BID  . aspirin  325 mg Oral Daily  . carvedilol  3.125 mg Oral BID WC  . cefUROXime (ZINACEF)  IV  1.5 g Intravenous Q12H  . docusate sodium  100 mg Oral Daily  . folic acid  1 mg Oral Daily  . heparin  5,000 Units Subcutaneous Q8H  . pantoprazole  40 mg Oral Q1200  . sodium chloride  3 mL Intravenous Q12H  . DISCONTD: heparin  5,000 Units Subcutaneous Q8H   Continuous Infusions:    . sodium chloride    .  DISCONTD: sodium chloride 150 mL/hr at 08/15/11 0550  . DISCONTD:  sodium bicarbonate infusion 1000 mL 100 mL/hr at 08/14/11 1732  . DISCONTD:  sodium bicarbonate infusion 1000 mL 125 mL/hr at 08/15/11 2216

## 2011-08-16 NOTE — Progress Notes (Addendum)
Clinical Social Work Department CLINICAL SOCIAL WORK PLACEMENT NOTE 08/16/2011  Patient:  Jeremy Howell, Jeremy Howell  Account Number:  [id number] Admit date:  08/14/2011  Clinical Social Worker:  Skip Mayer  Date/time:  08/16/2011 04:04 PM  Clinical Social Work is seeking post-discharge placement for this patient at the following level of care:   SKILLED NURSING   (*CSW will update this form in Epic as items are completed)   08/16/2011  Patient/family provided with [facility] Department of Clinical Social Work's list of facilities offering this level of care within the geographic area requested by the patient (or if unable, by the patient's family).  08/16/2011  Patient/family informed of their freedom to choose among providers that offer the needed level of care, that participate in Medicare, Medicaid or managed care program needed by the patient, have an available bed and are willing to accept the patient.  08/16/2011  Patient/family informed of [facility]' ownership interest in [facility], as well as of the fact that they are under no obligation to receive care at this facility.  PASARR submitted to EDS on  PASARR number received from EDS on   FL2 transmitted to all facilities in geographic area requested by pt/family on  08/16/2011 FL2 transmitted to all facilities within larger geographic area on   Patient informed that his/her managed care company has contracts with or will negotiate with  certain facilities, including the following:     Patient/family informed of bed offers received: 08/18/11  Patient chooses bed at [facility] Physician recommends and patient chooses bed at   [facility]   Patient to be transferred to  on  08/19/11 Patient to be transferred to facility by [facility] The following physician request were entered in Epic:   Additional Comments: Sabino Niemann, MSW, LCSWA [phone]

## 2011-08-16 NOTE — Evaluation (Signed)
Physical Therapy Evaluation Patient Details Name: Jeremy Howell MRN: [id number] DOB: 1955-08-24 Today's Date: 08/16/2011 Time: 2130-8657 PT Time Calculation (min): 15 min  PT Assessment / Plan / Recommendation Clinical Impression  Patient is a 56 yo male now s/p left AKA.  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  Patient required +2 assist with transfers today.  Was able to bear weight on RLE.  Recommend ST-SNF at discharge for continued therapy.  Will benefit from acute PT for functional mobility training.      PT Assessment  Patient needs continued PT services    Follow Up Recommendations  Skilled nursing facility    Barriers to Discharge        Equipment Recommendations  Defer to next venue    Recommendations for Other Services     Frequency Min 3X/week    Precautions / Restrictions Precautions Precautions: Fall Restrictions Weight Bearing Restrictions: No   Pertinent Vitals/Pain Pain limiting session.  Patient moaning with movement      Mobility  Bed Mobility Bed Mobility: Supine to Sit;Sitting - Scoot to Edge of Bed Supine to Sit: 1: +2 Total assist;HOB flat Supine to Sit: Patient Percentage: 40% Sitting - Scoot to Edge of Bed: 1: +1 Total assist Details for Bed Mobility Assistance: Patient pushing posteriorly into extension with trunk/hips. Increased extensor tone RLE.  Verbal and tactile cues for technique. Transfers Transfers: Sit to Stand;Stand to Sit;Stand Pivot Transfers Sit to Stand: 1: +2 Total assist;With upper extremity assist;From bed Sit to Stand: Patient Percentage: 50% Stand to Sit: 1: +2 Total assist;To chair/3-in-1 Stand to Sit: Patient Percentage: 50% Stand Pivot Transfers: 1: +2 Total assist Stand Pivot Transfers: Patient Percentage: 50% Details for Transfer Assistance: Verbal and tactile cues for technique for transfers.  Difficulty with hip/trunk flexion when sitting down into chair.      Exercises     PT Diagnosis:  Acute pain;Generalized weakness;Difficulty walking  PT Problem List: Decreased strength;Decreased activity tolerance;Decreased balance;Decreased mobility;Pain;Decreased cognition PT Treatment Interventions: DME instruction;Functional mobility training;Therapeutic activities;Therapeutic exercise;Balance training;Patient/family education   PT Goals Acute Rehab PT Goals PT Goal Formulation: Patient unable to participate in goal setting Time For Goal Achievement: 08/30/11 Potential to Achieve Goals: Good Pt will go Supine/Side to Sit: with min assist;with rail;with HOB 0 degrees PT Goal: Supine/Side to Sit - Progress: Goal set today Pt will go Sit to Supine/Side: with min assist;with HOB 0 degrees;with rail PT Goal: Sit to Supine/Side - Progress: Goal set today Pt will go Sit to Stand: with min assist;with upper extremity assist PT Goal: Sit to Stand - Progress: Goal set today Pt will go Stand to Sit: with min assist;with upper extremity assist PT Goal: Stand to Sit - Progress: Goal set today Pt will Transfer Bed to Chair/Chair to Bed: with min assist PT Transfer Goal: Bed to Chair/Chair to Bed - Progress: Goal set today  Visit Information  Last PT Received On: 08/16/11 Assistance Needed: +2    Subjective Data  Subjective: "Yeah"  Single word responses Patient Stated Goal: None stated   Prior Functioning  Home Living Lives With: Family (sister per chart) Available Help at Discharge: Family (Unable to gather additional information from patient) Prior Function Level of Independence:  (Unknown) Communication Communication: Expressive difficulties;Receptive difficulties    Cognition  Overall Cognitive Status: Difficult to assess Difficult to assess due to: Impaired communication Arousal/Alertness: Awake/alert Orientation Level: Disoriented to;Time;Place;Situation Behavior During Session: [facility] for tasks performed    Extremity/Trunk Assessment Right Upper Extremity  Assessment RUE  ROM/Strength/Tone: Deficits RUE ROM/Strength/Tone Deficits:  (Hand fisted; Strength 3-/5) Left Upper Extremity Assessment LUE ROM/Strength/Tone: WFL for tasks assessed Right Lower Extremity Assessment RLE ROM/Strength/Tone: Deficits RLE ROM/Strength/Tone Deficits:  (Strength grossly 3+/5; increased extensor tone) Left Lower Extremity Assessment LLE ROM/Strength/Tone: Deficits LLE ROM/Strength/Tone Deficits: Hip movement 3/5; AKA   Balance Balance Balance Assessed: Yes Static Sitting Balance Static Sitting - Balance Support: Left upper extremity supported;Feet unsupported Static Sitting - Level of Assistance: 3: Mod assist Static Sitting - Comment/# of Minutes: Patient required mod assist to maintain upright sitting balance/posture.  Was able to hold static sitting with supervision for 30 seconds.  Patient tends to push into extension at trunk/hips.  Worked on moving trunk forward over hips and using LUE for support.  End of Session PT - End of Session Equipment Utilized During Treatment: Gait belt Activity Tolerance: Patient limited by pain Patient left: in chair;with call bell/phone within reach Nurse Communication: Mobility status  GP     Vena Austria 08/16/2011, 10:49 AM  Durenda Hurt. Renaldo Fiddler, [facility] Acute Rehab Services Pager [phone]

## 2011-08-16 NOTE — Progress Notes (Signed)
[facility]  Subjective  - POD 1 left AKA, some pain overnight but better today  Objective 155/82 76 98.3 F (36.8 C) (Oral) 10 96%  Intake/Output Summary (Last 24 hours) at 08/16/11 0940 Last data filed at 08/16/11 1610  Gross per 24 hour  Intake   2910 ml  Output   1800 ml  Net   1110 ml   Left AKA dressing intact, right foot warm  Assessment/Planning: PT/OT consults pending Social work to discuss d/c planning with family Renal function continues to improve  Jeremy Howell,Jeremy Howell 08/16/2011 9:40 AM --  Laboratory Lab Results:  Basename 08/16/11 0415 08/15/11 1130  WBC 7.5 8.3  HGB 13.0 13.6  HCT 37.3* 39.4  PLT 173 182   BMET  Basename 08/16/11 0415 08/15/11 1130  NA 149* 149*  K 3.5 4.3  CL 104 106  CO2 31 25  GLUCOSE 157* 138*  BUN 106* 135*  CREATININE 5.59* 8.69*  CALCIUM 7.7* 7.8*    COAG Lab Results  Component Value Date   INR 1.21 08/15/2011   INR 1.24 08/14/2011   INR 0.96 02/11/2011   No results found for this basename: PTT    Antibiotics Anti-infectives     Start     Dose/Rate Route Frequency Ordered Stop   08/15/11 2000   cefUROXime (ZINACEF) 1.5 g in dextrose 5 % 50 mL IVPB        1.5 g 100 mL/hr over 30 Minutes Intravenous Every 12 hours 08/15/11 1048 08/15/11 2050

## 2011-08-16 NOTE — Progress Notes (Signed)
Clinical Social Work Department BRIEF PSYCHOSOCIAL ASSESSMENT 08/16/2011  Patient:  Jeremy Howell, Jeremy Howell     Account Number:  [id number]     Admit date:  08/14/2011  Clinical Social Worker:  Skip Mayer  Date/Time:  08/16/2011 03:51 PM Telephone contact and note transcribed by Lia Foyer, LCSWA.  Referred by:  Physician  Date Referred:  08/16/2011 Referred for  SNF Placement   Other Referral:   Interview type:  Family Other interview type:    PSYCHOSOCIAL DATA Living Status:  FAMILY Admitted from facility:   Level of care:   Primary support name:  Jeremy Howell Primary support relationship to patient:  SIBLING Degree of support available:   Strong and vested.    CURRENT CONCERNS Current Concerns  Post-Acute Placement   Other Concerns:    SOCIAL WORK ASSESSMENT / PLAN CSW consulted with patient's sister, Jeremy Howell, the decision maker per physician request. Physical therapy recommended a skilled nursing facility placement and Jeremy Howell understood and agreed on the patient's discharge plan. CSW completed an FL2 and faxed out the information to SNF's in [facility]. Jeremy Howell provided patient's brother as an adittional contact person in case she was unable to be reached: Jeremy Howell [phone].   Assessment/plan status:   Other assessment/ plan:   Information/referral to community resources:    PATIENT'S/FAMILY'S RESPONSE TO PLAN OF CARE: Patient's sister is agreeable with discharge plan and thanked the CSW for support.   Lia Foyer, LCSWA [facility] Clinical Social Worker Contact #: [phone] (weekend)

## 2011-08-16 NOTE — Progress Notes (Signed)
Assessment:  1 Hyperkalemia due to ischemia, treated & resolved 2 Acute Renal Failure due to dehydration and rhabdomyolysis, never treated w/ dialysis, nonoliguric and recovering 3 Metabolic acidosis, pos anion gap, resolved 4 Limb ischemia, LLE; s/p AKA Amputation  5 Hypernatremia, developing Suggest:  1 IVF to continue at higher reate & increase free H20.    We will sign off. Thanks     Subjective: Interval History: No complaints  Objective: Vital signs in last 24 hours: Temp:  [97.3 F (36.3 C)-99.3 F (37.4 C)] 98.3 F (36.8 C) (07/07 0834) Pulse Rate:  [74-95] 76  (07/07 0834) Resp:  [10-16] 10  (07/07 0834) BP: (129-170)/(70-92) 155/82 mmHg (07/07 0834) SpO2:  [85 %-100 %] 96 % (07/07 0834) Weight:  [55 kg (121 lb 4.1 oz)] 55 kg (121 lb 4.1 oz) (07/07 0402) Weight change: -4.5 kg (-9 lb 14.7 oz)  Intake/Output from previous day: 07/06 0701 - 07/07 0700 In: 3560 [P.O.:610; I.V.:2950] Out: 2040 [Urine:1940; Blood:100] Intake/Output this shift:    General appearance: alert and cooperative Head: Normocephalic, without obvious abnormality, atraumatic Resp: clear to auscultation bilaterally Chest wall: no tenderness Cardio: regular rate and rhythm, S1, S2 normal, no murmur, click, rub or gallop GI: soft, non-tender; bowel sounds normal; no masses,  no organomegaly Extremities: left AKA, no edema on right but dark toes Neuro limited communication ability  Lab Results:  Basename 08/16/11 0415 08/15/11 1130  WBC 7.5 8.3  HGB 13.0 13.6  HCT 37.3* 39.4  PLT 173 182   BMET:  Basename 08/16/11 0415 08/15/11 1130  NA 149* 149*  K 3.5 4.3  CL 104 106  CO2 31 25  GLUCOSE 157* 138*  BUN 106* 135*  CREATININE 5.59* 8.69*  CALCIUM 7.7* 7.8*   No results found for this basename: PTH:2 in the last 72 hours Iron Studies: No results found for this basename: IRON,TIBC,TRANSFERRIN,FERRITIN in the last 72 hours Studies/Results: Dg Chest 1 View  08/14/2011  *RADIOLOGY  REPORT*  Clinical Data: Injury  CHEST - 1 VIEW  Comparison: 02/11/2011  Findings: Mild cardiomegaly.  Clear lungs.  No definite acute bony deformity.  Lytic bone lesion in the proximal humerus is not significantly changed.  IMPRESSION: Cardiomegaly without edema.  No active cardiopulmonary disease.  Original Report Authenticated By: Donavan Burnet, M.D.   Dg Pelvis 1-2 Views  08/14/2011  *RADIOLOGY REPORT*  Clinical Data: Pelvic pain  PELVIS - 1-2 VIEW  Comparison: 03/23/2003  Findings: There is been previous acetabular reconstruction and ORIF of a proximal humeral fracture.  There is advanced degenerative arthropathy of the hip and the hip joint may be actually fused.  I do not see any acute finding in that region.  The right hip shows small marginal osteophytes but no significant disc space narrowing. Sacroiliac joints and symphysis pubis appear normal.  IMPRESSION: No acute finding.  Postsurgical and post-traumatic deformity of the left hip region with degenerative change and possible fusion.  Original Report Authenticated By: Thomasenia Sales, M.D.    Scheduled:   . antiseptic oral rinse  15 mL Mouth Rinse BID  . aspirin  325 mg Oral Daily  . carvedilol  3.125 mg Oral BID WC  . cefUROXime (ZINACEF)  IV  1.5 g Intravenous Q12H  . docusate sodium  100 mg Oral Daily  . folic acid  1 mg Oral Daily  . heparin  5,000 Units Subcutaneous Q8H  . pantoprazole  40 mg Oral Q1200  . sodium chloride  3 mL Intravenous Q12H  .  DISCONTD: heparin  5,000 Units Subcutaneous Q8H     LOS: 2 days   Karena Kinker C 08/16/2011,8:41 AM

## 2011-08-16 NOTE — Progress Notes (Signed)
Patient transferred to 4715 on wheelchair, stable.attempted to call patient sister Ms Damyan Corne to inform her about the transfer.Voice message left.

## 2011-08-17 ENCOUNTER — Encounter (HOSPITAL_COMMUNITY): Payer: Self-pay | Admitting: Vascular Surgery

## 2011-08-17 LAB — GLUCOSE, CAPILLARY: Glucose-Capillary: 148 mg/dL — ABNORMAL HIGH (ref 70–99)

## 2011-08-17 LAB — CBC
Hemoglobin: 13.1 g/dL (ref 13.0–17.0)
MCHC: 33.3 g/dL (ref 30.0–36.0)
Platelets: 203 10*3/uL (ref 150–400)
RBC: 4.93 MIL/uL (ref 4.22–5.81)

## 2011-08-17 LAB — BASIC METABOLIC PANEL
Calcium: 8.3 mg/dL — ABNORMAL LOW (ref 8.4–10.5)
GFR calc Af Amer: 31 mL/min — ABNORMAL LOW (ref >90)
GFR calc non Af Amer: 27 mL/min — ABNORMAL LOW (ref >90)
Potassium: 3.5 mEq/L (ref 3.5–5.1)
Sodium: 143 mEq/L (ref 135–145)

## 2011-08-17 MED ORDER — CARVEDILOL 12.5 MG PO TABS
12.5000 mg | ORAL_TABLET | Freq: Two times a day (BID) | ORAL | Status: DC
  Administered 2011-08-17 – 2011-08-18 (×2): 12.5 mg via ORAL
  Filled 2011-08-17 (×4): qty 1

## 2011-08-17 MED ORDER — JUVEN PO PACK
1.0000 | PACK | Freq: Two times a day (BID) | ORAL | Status: DC
  Administered 2011-08-17 – 2011-08-19 (×4): 1 via ORAL
  Filled 2011-08-17 (×5): qty 1

## 2011-08-17 MED ORDER — AMLODIPINE BESYLATE 10 MG PO TABS
10.0000 mg | ORAL_TABLET | Freq: Every day | ORAL | Status: DC
  Administered 2011-08-17 – 2011-08-19 (×3): 10 mg via ORAL
  Filled 2011-08-17 (×3): qty 1

## 2011-08-17 NOTE — Progress Notes (Signed)
[facility] PROGRESS NOTE  Jeremy Howell BJY:[id number] DOB: 09/03/1955 DOA: 08/14/2011 PCP: Dorrene German, MD  Assessment/Plan: 1. Acute renal failure-most likely due to dehydration, rhabdomyolysis, in the setting of critical limb ischemia and  gangrene of the left leg and  foot. Patient is making urine. He does not seem to be in shock or respiratory distress. Patient was started on iv NS and iv bicarbonate from admission and received these type of fluids for 48 hrs.  Switched to 1/2 NS on 08/16/11. Creatinine continued to improve and iv fluids were stopped on 08/17/11. Monitoring now for postATN polyuria . Appreciate management by Dr. Lowell Guitar and associates  2. Hyperkalemia k>7.5 on admission due to renal failure and rhabdomyolysis-patient was treated in  emergency room with Kayexalate, bicarbonate, dextrose, insulin and a repeat basic metabolic profile continued to show elevated potassium. Bicarbonate, dextrose and insulin were given again and we were able to control the hyperkalemia without need for HD 3. 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 . Emergent vascular surgical consultation has been obtained on 08/14/11 and the patient was taken to OR for AKA on 08/15/11 after his metabolic abnormalities have been corrected. Will need PT/OT eval and rehab post amputation  4. Diabetes mellitus type 2- NovoLog SSI . Added lantus 5 units qhs 08/16/11  5. Chronic systolic congestive heart failure-we'll monitor fluid status closely. Coreg 3.125 Bid resumed on 08/15/11. Titrate up Coreg to 6/25 BID 7/7 then 12.5 BID on 7/8  6. HTN - initially we had to hold all meds. We are gradually titrating up Coreg - see above. Started also Norvasc 10 mg daily on 7/8. Would prefer to avoid clonidine.     Principal Problem:  *ARF (acute renal failure) Active Problems:  Diabetes mellitus  Cardiomyopathy  Aphasia  Gangrene of foot  PVD (peripheral vascular disease)  Metabolic acidosis  CVA (cerebral infarction)  Rhabdomyolysis  Chronic systolic CHF (congestive heart failure)  HTN (hypertension)  Code Status: Full  Family Communication: [phone]  Disposition Plan: Skilled nursing home    Gwyndolyn Guilford, MD  [facility] Pager [phone]  If 7PM-7AM, please contact night-coverage [email or web address] Password [email or web address] 08/17/2011, 11:39 AM   LOS: 3 days     Consultants:  VVS  CKA  Procedures:  Left AKA 7/6  Antibiotics:    HPI/Subjective: Looks and feels great  Objective: Filed Vitals:   08/16/11 2107 08/17/11 0307 08/17/11 0439 08/17/11 1039  BP: 220/102 218/110 187/86 142/94  Pulse: 78  91   Temp: 98.8 F (37.1 C)  98.1 F (36.7 C)   TempSrc: Oral  Oral   Resp: 16  16   Height:      Weight:   52.8 kg (116 lb 6.5 oz)   SpO2: 97%  97%     Intake/Output Summary (Last 24 hours) at 08/17/11 1139 Last data filed at 08/17/11 1030  Gross per 24 hour  Intake   1228 ml  Output   3200 ml  Net  -1972 ml    Exam:   Alert, answering short questions with yes and no  Heart regular rate and rhythm without murmurs rubs gallops  Lungs clear to auscultation bilaterally  Abdomen softer  Data Reviewed: Basic Metabolic Panel:  Lab 08/17/11 6962 08/16/11 0415 08/15/11 1130 08/15/11 0430 08/14/11 1833  NA 143 149* 149* 149* 144  K 3.5 3.5 4.3 4.1 5.4*  CL 99 104 106 106 102  CO2 28 31 25 22  18*  GLUCOSE 97 157* 138* 137* 120*  BUN 64* 106* 135* 142* 147*  CREATININE 2.53* 5.59* 8.69* 9.97* 11.68*  CALCIUM 8.3* 7.7* 7.8* 8.3* 9.3  MG -- -- -- -- --  PHOS -- -- -- -- --   Liver Function Tests:  Lab 08/15/11 0430 08/14/11 1229  AST 160* 232*  ALT 96* 120*  ALKPHOS 74 87  BILITOT 0.3 0.4  PROT 7.0 8.2  ALBUMIN 2.9* 3.4*   No results found for this basename: LIPASE:5,AMYLASE:5 in the last 168 hours No results found for  this basename: AMMONIA:5 in the last 168 hours CBC:  Lab 08/17/11 0600 08/16/11 0415 08/15/11 1130 08/15/11 0430 08/14/11 1335  WBC 8.6 7.5 8.3 9.5 13.5*  NEUTROABS -- -- -- -- 10.3*  HGB 13.1 13.0 13.6 14.1 16.8  HCT 39.3 37.3* 39.4 40.0 48.6  MCV 79.7 78.0 78.0 76.8* 79.2  PLT 203 173 182 193 179   Cardiac Enzymes:  Lab 08/14/11 1245 08/14/11 1229  CKTOTAL -- 13842*  CKMB -- --  CKMBINDEX -- --  TROPONINI 0.30* --   BNP (last 3 results)  Basename 02/14/11 0500 02/13/11 0830  PROBNP 104.1 182.0*   CBG:  Lab 08/17/11 1050 08/17/11 0550 08/16/11 2109 08/16/11 1626 08/16/11 1141  GLUCAP 146* 102* 148* 107* 112*    Recent Results (from the past 240 hour(s))  URINE CULTURE     Status: Normal   Collection Time   08/14/11 12:39 PM      Component Value Range Status Comment   Specimen Description URINE, CATHETERIZED   Final    Special Requests NONE   Final    Culture  Setup Time 08/14/2011 13:16   Final    Colony Count NO GROWTH   Final    Culture NO GROWTH   Final    Report Status 08/15/2011 FINAL   Final   MRSA PCR SCREENING     Status: Normal   Collection Time   08/14/11  9:41 PM      Component Value Range Status Comment   MRSA by PCR NEGATIVE  NEGATIVE Final      Studies: No results found.  Scheduled Meds:    . amLODipine  10 mg Oral Daily  . antiseptic oral rinse  15 mL Mouth Rinse BID  . aspirin  325 mg Oral Daily  . carvedilol  12.5 mg Oral BID WC  . docusate sodium  100 mg Oral Daily  . folic acid  1 mg Oral Daily  . heparin  5,000 Units Subcutaneous Q8H  . insulin aspart  0-9 Units Subcutaneous TID WC  . insulin glargine  5 Units Subcutaneous QHS  . pantoprazole  40 mg Oral Q1200  . sodium chloride  3 mL Intravenous Q12H  . DISCONTD: carvedilol  3.125 mg Oral BID WC  . DISCONTD: carvedilol  6.25 mg Oral BID WC   Continuous Infusions:    . DISCONTD: sodium chloride Stopped (08/17/11 0858)

## 2011-08-17 NOTE — Evaluation (Signed)
Occupational Therapy Evaluation Patient Details Name: Jeremy Howell MRN: [id number] DOB: July 13, 1955 Today's Date: 08/17/2011 Time: 5366-4403 OT Time Calculation (min): 25 min  OT Assessment / Plan / Recommendation Clinical Impression  Pleasant 56 yr old man admitted for new left AKA.  Has history of prior CVA with hemiparesis.  Now with significant increase in dependence for basic selfcare tasks and functional transfers.  Will benefit from acute OT services to address these issues and increase overall independence.  Anticipate pt will need SNF level rehab before returning home with sister.       OT Assessment  Patient needs continued OT Services    Follow Up Recommendations  Skilled nursing facility    Barriers to Discharge Decreased caregiver support    Equipment Recommendations  Defer to next venue       Frequency  Min 2X/week    Precautions / Restrictions Precautions Precautions: Fall Precaution Comments: History of right hemiparesis and expressive aphasia from previous CVA.   Restrictions Weight Bearing Restrictions: No   Pertinent Vitals/Pain Pt with 4/10 pain in the LLE based on the faces scale    ADL  Eating/Feeding: Simulated;Set up (with LUE) Where Assessed - Eating/Feeding: Chair Grooming: Performed;Wash/dry face;Teeth care;Set up Where Assessed - Grooming: Supported sitting Upper Body Bathing: Simulated;Minimal assistance Where Assessed - Upper Body Bathing: Supported sitting Lower Body Bathing: Simulated;+2 Total assistance Lower Body Bathing: Patient Percentage: 40% Where Assessed - Lower Body Bathing: Supported sit to stand Upper Body Dressing: Simulated;Set up Where Assessed - Upper Body Dressing: Supported sitting Lower Body Dressing: Simulated;+2 Total assistance Lower Body Dressing: Patient Percentage: 30% Where Assessed - Lower Body Dressing: Supported sit to stand Toilet Transfer: Simulated;Maximal assistance Toilet Transfer Method: Scientist, product/process development: Other (comment) (TO EOB from bedside chair.  Pt still with cathetor.) Toileting - Clothing Manipulation and Hygiene: Simulated;+2 Total assistance Toileting - Clothing Manipulation and Hygiene: Patient Percentage: 30% Where Assessed - Glass blower/designer Manipulation and Hygiene: Other (comment) (Sit to stand from EOB.) Tub/Shower Transfer Method: Not assessed Transfers/Ambulation Related to ADLs: Pt not able to ambulate currently.  Needs max asist to perform stand/squat pivot transfers to the right and left. ADL Comments: Pt with increased posterior lean in sitting unsupported.  Demonstrates decreased forward flexion for sit to stand and attempting LB selfcare tasks.  Attempts to use the RUE as a gross assist for opening containers with supervision.    OT Diagnosis: Generalized weakness;Acute pain;Hemiplegia non-dominant side  OT Problem List: Decreased strength;Impaired balance (sitting and/or standing);Decreased safety awareness;Decreased knowledge of use of DME or AE;Impaired tone;Pain;Impaired UE functional use OT Treatment Interventions: Self-care/ADL training;Therapeutic exercise;Neuromuscular education;DME and/or AE instruction;Manual therapy;Patient/family education;Balance training;Therapeutic activities   OT Goals Acute Rehab OT Goals OT Goal Formulation: With patient Time For Goal Achievement: 08/31/11 ADL Goals Pt Will Perform Lower Body Bathing: with max assist;Sit to stand from chair;Sit to stand from bed ADL Goal: Lower Body Bathing - Progress: Goal set today Pt Will Perform Lower Body Dressing: with max assist;Sit to stand from chair;Sit to stand from bed Pt Will Transfer to Toilet: with mod assist;Stand pivot transfer;Drop arm 3-in-1 ADL Goal: Toilet Transfer - Progress: Goal set today Pt Will Perform Toileting - Clothing Manipulation: with max assist;Sitting on 3-in-1 or toilet;Standing ADL Goal: Toileting - Clothing Manipulation - Progress:  Goal set today Pt Will Perform Toileting - Hygiene: with set-up;Sitting on 3-in-1 or toilet ADL Goal: Toileting - Hygiene - Progress: Goal set today  Visit Information  Last OT Received On:  08/17/11 Assistance Needed: +1    Subjective Data  Subjective: Pt expressively aphasic Patient Stated Goal: Did not state but agreeable to participate in therpay.   Prior Functioning  Home Living Lives With: Family (sister per chart) Available Help at Discharge: Family (Unable to gather additional information from patient) Additional Comments: Pt will need SNF for follow-up therapy. Prior Function Level of Independence:  (Unknown) Communication Communication: Expressive difficulties;Receptive difficulties    Cognition  Overall Cognitive Status: Difficult to assess Difficult to assess due to: Impaired communication (Pt with history of expressive aphasia.)    Extremity/Trunk Assessment Right Upper Extremity Assessment RUE ROM/Strength/Tone: Deficits RUE ROM/Strength/Tone Deficits: Pt with history of hemiparesis secondary to CVA.  currently Brunnstrum stage V in the hand and arm.  Increased tone in the shoulder, pectoral, biceps and finger flexors however pt is able to activate shoulder movements and demonstrates isolated elbow flexion and extension.  Limited PROM at the wrist and demonstrates only gross grasp and release with limiited active thumb movement.  RUE Sensation:  (Unable to determine secondary to aphasia.) RUE Coordination: Deficits RUE Coordination Deficits: see above Left Upper Extremity Assessment LUE ROM/Strength/Tone: Within functional levels LUE Sensation: WFL - Light Touch LUE Coordination: WFL - gross/fine motor Trunk Assessment Trunk Assessment: Other exceptions Trunk Exceptions: posterior plevic tilt   Mobility Transfers Transfers: Sit to Stand Sit to Stand: 2: Max assist;With upper extremity assist;From chair/3-in-1;From bed;Other (comment) (decreased forward trunk  flexion) Stand to Sit: 2: Max assist      Higher education careers adviser Sitting Balance Static Sitting - Balance Support: Right upper extremity supported Static Sitting - Level of Assistance: 4: Min assist  End of Session OT - End of Session Activity Tolerance: Patient tolerated treatment well Patient left: in chair;with call bell/phone within reach Nurse Communication: Mobility status     Kanika Bungert OTR/L 08/17/2011, 4:13 PM Pager number [phone]

## 2011-08-17 NOTE — Progress Notes (Signed)
Put pt back to bed with the help of the nurse tech.  Pt did not tolerate well.  Will continue to monitor and give pain medications as needed.

## 2011-08-17 NOTE — Progress Notes (Signed)
INITIAL ADULT NUTRITION ASSESSMENT Date: 08/17/2011   Time: 12:27 PM Reason for Assessment: Nutrition Risk  INTERVENTION: 1. Juven, 1 packet BID, provides 80 kcal and 14 gm amino acids per packet.  2. RD will continue to follow   ASSESSMENT: Male 56 y.o.  Dx: ARF (acute renal failure)  Hx:  Past Medical History  Diagnosis Date  . Cardiomyopathy     EF 20-25% by echo 01/2010 with severe LVH felt secondary to substance abuse (no ischemic workup)  . 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 Diabetes mellitus   . HTN (hypertension)   . Polysubstance abuse     Reported hx of cocaine, THC, heroin, EtOH abuse  . Noncompliance    Past Surgical History  Procedure Date  . 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 amLODipine  10 mg Oral Daily  . antiseptic oral rinse  15 mL Mouth Rinse BID  . aspirin  325 mg Oral Daily  . carvedilol  12.5 mg Oral BID WC  . docusate sodium  100 mg Oral Daily  . folic acid  1 mg Oral Daily  . heparin  5,000 Units Subcutaneous Q8H  . insulin aspart  0-9 Units Subcutaneous TID WC  . insulin glargine  5 Units Subcutaneous QHS  . pantoprazole  40 mg Oral Q1200  . sodium chloride  3 mL Intravenous Q12H  . DISCONTD: carvedilol  3.125 mg Oral BID WC  . DISCONTD: carvedilol  6.25 mg Oral BID WC     Ht: 5\' 5"  (165.1 cm)  Wt: 116 lb 6.5 oz (52.8 kg) (bed scale)  Ideal Wt: 52.5 kg adj for AKA % Ideal Wt: 100%  Usual Wt:  Wt Readings from Last 10 Encounters:  08/17/11 116 lb 6.5 oz (52.8 kg)  08/17/11 116 lb 6.5 oz (52.8 kg)  02/15/11 132 lb 11.5 oz (60.2 kg)  08/16/11 0402    121 lbs (55 kg) 7/713 1540     117 lbs (53 kg) *weight loss with amputation  % Usual Wt: 92%  BMI= 22.8 kg/(m^2) adj for AKA  Food/Nutrition Related Hx: unintentional weight loss per nutrition risk report completed on admission.   Labs:  CMP     Component Value Date/Time   NA 143  08/17/2011 0600   K 3.5 08/17/2011 0600   CL 99 08/17/2011 0600   CO2 28 08/17/2011 0600   GLUCOSE 97 08/17/2011 0600   BUN 64* 08/17/2011 0600   CREATININE 2.53* 08/17/2011 0600   CALCIUM 8.3* 08/17/2011 0600   PROT 7.0 08/15/2011 0430   ALBUMIN 2.9* 08/15/2011 0430   AST 160* 08/15/2011 0430   ALT 96* 08/15/2011 0430   ALKPHOS 74 08/15/2011 0430   BILITOT 0.3 08/15/2011 0430   GFRNONAA 27* 08/17/2011 0600   GFRAA 31* 08/17/2011 0600     Intake/Output Summary (Last 24 hours) at 08/17/11 1235 Last data filed at 08/17/11 1030  Gross per 24 hour  Intake   1178 ml  Output   3200 ml  Net  -2022 ml     Diet Order: Dysphagia 3 thin liquids  Supplements/Tube Feeding: none   IVF:    DISCONTD: sodium chloride Last Rate: Stopped (08/17/11 0858)    Estimated Nutritional Needs:   Kcal: 1800-2000 Protein: 70-80 gm  Fluid:  1.8-2 L   Pt with some weight loss, pt was 132 lbs  earlier this year down to 121 lbs prior to amputation, 11 lbs lost (8.3% ) in 6 months, not significant. Pt unable to give any weight hx, reports he may have lost weight. States his appetite has been good and eating well. Sometimes drinks nutrition supplements.  Pt with AKA at this admission, healing well per notes. 20-25% intake per documentation. Pt willing to add nutrition supplements for increased protein intake.   NUTRITION DIAGNOSIS: -Inadequate oral intake (NI-2.1).  Status: Ongoing  RELATED TO: etiology unknown   AS EVIDENCE BY: weight loss   MONITORING/EVALUATION(Goals): Goal: PO intake of meals and supplements will promote wound healing Monitor: PO intake, weight, labs, healing  EDUCATION NEEDS: -No education needs identified at this time    DOCUMENTATION CODES Per approved criteria  -Not Applicable    Clarene Duke RD, LDN Pager [phone] After Hours pager [phone]

## 2011-08-17 NOTE — Progress Notes (Signed)
Need order to continue or d/c foley cath.

## 2011-08-17 NOTE — Progress Notes (Addendum)
[facility] [place]  Postoperative Visit - Amputation  Date of Surgery: 08/14/2011 - 08/15/2011 Procedure(s): AMPUTATION ABOVE KNEE Left Surgeon: Surgeon(s): Sherren Kerns, MD POD: 2 Days Post-Op  Subjective Jeremy Howell is a 56 y.o. male who is S/P Left Procedure(s): AMPUTATION ABOVE KNEE.  Pt.reports increased pain in the stump. The patient notes pain is well controlled. Pt. denies phantom pain.   Pt is A&Ox3  WDWN male with no complaints  Left amputation wound is clean, dry, no drainage and healing well.  There is good bone coverage in the stump Stump is warm and well perfused, without drainage; without erythema   Assessment/plan:  Jeremy Howell is a 56 y.o. male who is s/p Left Procedure(s): AMPUTATION ABOVE KNEE  The patient's stump is clean, dry, intact or viable.  Follow-up 4 weeks from surgery D/C foley cont to watch I & Doran Heater [facility] 7:24 AM 08/17/[phone]    Pain seems well controlled Doing well from AKA   [name], MD [facility] [place] Office: [phone] Pager: [phone]

## 2011-08-17 NOTE — Progress Notes (Signed)
Physical Therapy Treatment Patient Details Name: Jeremy Howell MRN: [id number] DOB: October 13, 1955 Today's Date: 08/17/2011 Time: 1000-1023 PT Time Calculation (min): 23 min  PT Assessment / Plan / Recommendation Comments on Treatment Session  Pt has difficulty following single and multiple step commands secondary to Aphasia. Difficult communicating with pt.    Follow Up Recommendations  Skilled nursing facility    Barriers to Discharge        Equipment Recommendations  Defer to next venue    Recommendations for Other Services    Frequency Min 3X/week   Plan Discharge plan remains appropriate;Frequency remains appropriate    Precautions / Restrictions Precautions Precautions: Fall Restrictions Weight Bearing Restrictions: No   Pertinent Vitals/Pain Pt presents with pain in Left LE residual limb with movement.  Pt presents with R knee pain with knee extension.   RN notified.    Mobility  Bed Mobility Bed Mobility: Supine to Sit;Sitting - Scoot to Edge of Bed Supine to Sit: 1: +2 Total assist;HOB flat Supine to Sit: Patient Percentage: 30% Sitting - Scoot to Edge of Bed: 1: +1 Total assist Details for Bed Mobility Assistance: Patient pushing posteriorly into extension with trunk/hips. Increased extensor tone RLE.  Verbal and tactile cues for technique. Transfers Transfers: Sit to Stand;Stand Pivot Transfers;Stand to Sit Sit to Stand: 2: Max assist;From bed;With upper extremity assist Sit to Stand: Patient Percentage: 40% Stand to Sit: 2: Max assist Stand to Sit: Patient Percentage: 40% Stand Pivot Transfers: 1: +2 Total assist Stand Pivot Transfers: Patient Percentage: 40% Details for Transfer Assistance: From EOB pt able to stand pulling on therpist with his left  UE and support his weight through is R LE.  Assist to intiate anterior wt shift and tactile cueing for Trunk extension. Pt unable to achieve full extension of R LE. (c/o pain in supine when RLE fully  extended.) Ambulation/Gait Ambulation/Gait Assistance: Not tested (comment) Wheelchair Mobility Wheelchair Mobility: No    Exercises Total Joint Exercises Heel Slides: AAROM;Right;5 reps;Supine   PT Diagnosis:    PT Problem List:   PT Treatment Interventions:     PT Goals Acute Rehab PT Goals PT Goal Formulation: Patient unable to participate in goal setting Time For Goal Achievement: 08/30/11 Potential to Achieve Goals: Good Pt will go Supine/Side to Sit: with min assist;with rail;with HOB 0 degrees PT Goal: Supine/Side to Sit - Progress: Progressing toward goal Pt will Sit at [facility] of Bed: with supervision;3-5 min;with unilateral upper extremity support PT Goal: Sit at Edge Of Bed - Progress: Goal set today Pt will go Sit to Supine/Side: with min assist;with HOB 0 degrees;with rail PT Goal: Sit to Supine/Side - Progress: Progressing toward goal Pt will go Sit to Stand: with min assist;with upper extremity assist PT Goal: Sit to Stand - Progress: Progressing toward goal Pt will go Stand to Sit: with min assist;with upper extremity assist PT Goal: Stand to Sit - Progress: Progressing toward goal Pt will Transfer Bed to Chair/Chair to Bed: with min assist PT Transfer Goal: Bed to Chair/Chair to Bed - Progress: Progressing toward goal  Visit Information  Last PT Received On: 08/17/11 Assistance Needed: +2    Subjective Data  Subjective: "Yeah"  Single word responses (Pt aphasic. ) Patient Stated Goal: None stated   Cognition  Overall Cognitive Status: Difficult to assess Difficult to assess due to: Impaired communication Arousal/Alertness: Awake/alert Orientation Level: Disoriented to;Time;Place;Situation Behavior During Session: [facility] for tasks performed    Balance  Balance Balance Assessed: Yes Static Sitting Balance  Static Sitting - Balance Support: Left upper extremity supported;Feet unsupported Static Sitting - Level of Assistance: 3: Mod assist;4: Min  assist Static Sitting - Comment/# of Minutes: Initially pt required max assist to maintain upright sitting position.  Tactile cues through posterior shoulders to encourage hip flexion resulted in increased trunk extension.  Discontinued tactile cues through posterior shoulders and applied downward force through pt's right knee to approximate RLE/foot to the floor.  This improved pt ability to maintain sitting posture without posterior Lean. Pt also able to use LUE (holding onto rail of chair) to prevent posteirior lean.  End of Session PT - End of Session Equipment Utilized During Treatment: Gait belt Activity Tolerance: Patient limited by pain;Treatment limited secondary to medical complications (Comment) (Difficulty following commands secondary to aphasia.) Patient left: in chair;with call bell/phone within reach Nurse Communication: Mobility status   GP     Korie Brabson 08/17/2011, 11:51 AM Theron Arista L. Asako Saliba DPT [phone]

## 2011-08-18 ENCOUNTER — Telehealth: Payer: Self-pay | Admitting: Vascular Surgery

## 2011-08-18 LAB — GLUCOSE, CAPILLARY
Glucose-Capillary: 112 mg/dL — ABNORMAL HIGH (ref 70–99)
Glucose-Capillary: 96 mg/dL (ref 70–99)
Glucose-Capillary: 113 mg/dL — ABNORMAL HIGH (ref 70–99)
Glucose-Capillary: 126 mg/dL — ABNORMAL HIGH (ref 70–99)

## 2011-08-18 LAB — BASIC METABOLIC PANEL
Calcium: 8.4 mg/dL (ref 8.4–10.5)
GFR calc non Af Amer: 46 mL/min — ABNORMAL LOW (ref >90)
Glucose, Bld: 91 mg/dL (ref 70–99)
Potassium: 2.8 mEq/L — ABNORMAL LOW (ref 3.5–5.1)
Sodium: 140 mEq/L (ref 135–145)

## 2011-08-18 MED ORDER — POTASSIUM CHLORIDE CRYS ER 20 MEQ PO TBCR
40.0000 meq | EXTENDED_RELEASE_TABLET | Freq: Two times a day (BID) | ORAL | Status: AC
  Administered 2011-08-18 (×2): 40 meq via ORAL
  Filled 2011-08-18 (×2): qty 2

## 2011-08-18 MED ORDER — AMLODIPINE BESYLATE 10 MG PO TABS: 10.0000 mg | ORAL_TABLET | Freq: Every day | ORAL | Status: DC

## 2011-08-18 MED ORDER — CARVEDILOL 25 MG PO TABS
25.0000 mg | ORAL_TABLET | Freq: Two times a day (BID) | ORAL | Status: DC
  Administered 2011-08-18 – 2011-08-19 (×2): 25 mg via ORAL
  Filled 2011-08-18 (×4): qty 1

## 2011-08-18 MED ORDER — INSULIN GLARGINE 100 UNIT/ML ~~LOC~~ SOLN: 5.0000 [IU] | Freq: Every day | SUBCUTANEOUS | Status: DC

## 2011-08-18 MED ORDER — INSULIN ASPART 100 UNIT/ML ~~LOC~~ SOLN: 0.0000 [IU] | Freq: Three times a day (TID) | SUBCUTANEOUS | Status: DC

## 2011-08-18 NOTE — Telephone Encounter (Signed)
Message copied by Fredrich Birks on Tue Aug 18, 2011  4:13 PM ------      Message from: Phillips Odor      Created: Tue Aug 18, 2011  1:06 PM                   ----- Message -----         From: Marlowe Shores, PA         Sent: 08/18/2011  12:35 PM           To: Melene Plan, RN            4 week F/U left [name] - has Cherlynn Polo

## 2011-08-18 NOTE — Progress Notes (Signed)
[facility] PROGRESS NOTE  Jeremy Howell UJW:[id number] DOB: 1955/10/22 DOA: 08/14/2011 PCP: Dorrene German, MD  Assessment/Plan: 1. Acute renal failure-most likely due to dehydration, rhabdomyolysis, in the setting of critical limb ischemia and  gangrene of the left leg and  foot. Patient is making urine. He does not seem to be in shock or respiratory distress. Patient was started on iv NS and iv bicarbonate from admission and received these type of fluids for 48 hrs.  Switched to 1/2 NS on 08/16/11. Creatinine continued to improve and iv fluids were stopped on 08/17/11. Monitoring now for postATN polyuria . Last creatinine is 1.6 on 08/18/11. Appreciate management by Dr. Lowell Guitar and associates  2. Hyperkalemia k>7.5 on admission due to renal failure and rhabdomyolysis-patient was treated in  emergency room with Kayexalate, bicarbonate, dextrose, insulin and a repeat basic metabolic profile continued to show elevated potassium. Bicarbonate, dextrose and insulin were given again and we were able to control the hyperkalemia without need for HD. 3. 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 . Emergent vascular surgical consultation has been obtained on 08/14/11 and the patient was taken to OR for AKA on 08/15/11 after his metabolic abnormalities have been corrected. Will need PT/OT eval and rehab post amputation  4. Diabetes mellitus type 2- NovoLog SSI . Added lantus 5 units qhs 08/16/11  5. Chronic systolic congestive heart failure-we'll monitor fluid status closely. Coreg 3.125 Bid resumed on 08/15/11. Titrate up Coreg to 6/25 BID 7/7 then 12.5 BID on 7/8 then 25 mg BID his home dose on 7/9.  6. HTN - initially we had to hold all meds. We are gradually titrating up Coreg - see above. Started also Norvasc 10 mg daily on 7/8. Would prefer to avoid clonidine.  7. Hypokalemia due to  postATN polyuria - developed on 08/18/11 - plan for potassium replacement.     Principal Problem:  *ARF (acute renal failure) Active Problems:  Diabetes mellitus  Cardiomyopathy  Aphasia  Gangrene of foot  PVD (peripheral vascular disease)  Metabolic acidosis  CVA (cerebral infarction)  Rhabdomyolysis  Chronic systolic CHF (congestive heart failure)  HTN (hypertension)  Code Status: Full  Family Communication: [phone]  Disposition Plan: Skilled nursing home    Gilberto Streck, MD  [facility] Pager [phone]  If 7PM-7AM, please contact night-coverage [email or web address] Password [email or web address] 08/18/2011, 11:00 AM   LOS: 4 days     Consultants:  VVS  CKA  Procedures:  Left AKA 7/6  Antibiotics:    HPI/Subjective: Looks and feels great  Objective: Filed Vitals:   08/17/11 2042 08/18/11 0549 08/18/11 0639 08/18/11 0927  BP: 148/91 174/100 168/94 136/90  Pulse: 98 98    Temp: 99.8 F (37.7 C) 98.1 F (36.7 C)    TempSrc: Oral Oral    Resp: 18 18    Height:      Weight:  52.3 kg (115 lb 4.8 oz)    SpO2: 97% 96%      Intake/Output Summary (Last 24 hours) at 08/18/11 1100 Last data filed at 08/18/11 0900  Gross per 24 hour  Intake    720 ml  Output   2375 ml  Net  -1655 ml    Exam:   Alert, answering short questions with yes and no  Heart regular rate and rhythm without murmurs rubs gallops  Lungs clear to auscultation bilaterally  Abdomen soft, NT  Data Reviewed: Basic Metabolic  Panel:  Lab 08/18/11 0525 08/17/11 0600 08/16/11 0415 08/15/11 1130 08/15/11 0430  NA 140 143 149* 149* 149*  K 2.8* 3.5 3.5 4.3 4.1  CL 95* 99 104 106 106  CO2 29 28 31 25 22   GLUCOSE 91 97 157* 138* 137*  BUN 45* 64* 106* 135* 142*  CREATININE 1.63* 2.53* 5.59* 8.69* 9.97*  CALCIUM 8.4 8.3* 7.7* 7.8* 8.3*  MG -- -- -- -- --  PHOS -- -- -- -- --   Liver Function Tests:  Lab 08/15/11 0430 08/14/11 1229  AST 160* 232*  ALT 96* 120*  ALKPHOS  74 87  BILITOT 0.3 0.4  PROT 7.0 8.2  ALBUMIN 2.9* 3.4*   No results found for this basename: LIPASE:5,AMYLASE:5 in the last 168 hours No results found for this basename: AMMONIA:5 in the last 168 hours CBC:  Lab 08/17/11 0600 08/16/11 0415 08/15/11 1130 08/15/11 0430 08/14/11 1335  WBC 8.6 7.5 8.3 9.5 13.5*  NEUTROABS -- -- -- -- 10.3*  HGB 13.1 13.0 13.6 14.1 16.8  HCT 39.3 37.3* 39.4 40.0 48.6  MCV 79.7 78.0 78.0 76.8* 79.2  PLT 203 173 182 193 179   Cardiac Enzymes:  Lab 08/14/11 1245 08/14/11 1229  CKTOTAL -- 13842*  CKMB -- --  CKMBINDEX -- --  TROPONINI 0.30* --   BNP (last 3 results)  Basename 02/14/11 0500 02/13/11 0830  PROBNP 104.1 182.0*   CBG:  Lab 08/18/11 0606 08/17/11 2120 08/17/11 1611 08/17/11 1050 08/17/11 0550  GLUCAP 96 112* 106* 146* 102*    Recent Results (from the past 240 hour(s))  URINE CULTURE     Status: Normal   Collection Time   08/14/11 12:39 PM      Component Value Range Status Comment   Specimen Description URINE, CATHETERIZED   Final    Special Requests NONE   Final    Culture  Setup Time 08/14/2011 13:16   Final    Colony Count NO GROWTH   Final    Culture NO GROWTH   Final    Report Status 08/15/2011 FINAL   Final   MRSA PCR SCREENING     Status: Normal   Collection Time   08/14/11  9:41 PM      Component Value Range Status Comment   MRSA by PCR NEGATIVE  NEGATIVE Final      Studies: No results found.  Scheduled Meds:    . amLODipine  10 mg Oral Daily  . antiseptic oral rinse  15 mL Mouth Rinse BID  . aspirin  325 mg Oral Daily  . carvedilol  12.5 mg Oral BID WC  . docusate sodium  100 mg Oral Daily  . folic acid  1 mg Oral Daily  . heparin  5,000 Units Subcutaneous Q8H  . insulin aspart  0-9 Units Subcutaneous TID WC  . insulin glargine  5 Units Subcutaneous QHS  . nutrition supplement  1 packet Oral BID BM  . pantoprazole  40 mg Oral Q1200  . potassium chloride  40 mEq Oral BID  . sodium chloride  3 mL  Intravenous Q12H   Continuous Infusions:

## 2011-08-18 NOTE — Progress Notes (Signed)
   CARE MANAGEMENT NOTE 08/18/2011  Patient:  Jeremy Howell, Jeremy Howell   Account Number:  [id number]  Date Initiated:  08/18/2011  Documentation initiated by:  Tera Mater  Subjective/Objective Assessment:   56yo male admitted with gangrenous left foot.     Action/Plan:   Probable dc to SNF for rehab   Anticipated DC Date:  08/22/2011   Anticipated DC Plan:  SKILLED NURSING FACILITY  In-house referral  Clinical Social Worker      DC Planning Services  CM consult      Choice offered to / List presented to:             Status of service:  In process, will continue to follow Medicare Important Message given?  NO (If response is "NO", the following Medicare IM given date fields will be blank) Date Medicare IM given:   Date Additional Medicare IM given:    Discharge Disposition:    Per UR Regulation:  Reviewed for med. necessity/level of care/duration of stay  If discussed at Long Length of Stay Meetings, dates discussed:    Comments:  08/18/11 0955 Had amputation of left above knee amputation on 7/6.  PT has recommended SNF post d/c.  CSW consulted. Tera Mater, RN, BSN NCM [phone]

## 2011-08-18 NOTE — Progress Notes (Signed)
Orthopedic Tech Progress Note Patient Details:  Jeremy Howell Apr 12, 1955 [id number]  Patient ID: Jeremy Howell, male   DOB: [birth year], 56 y.o.   MRN: [id number]   [name] 08/18/2011, 9:13 AM Called bio tech for 6 inch sockinet

## 2011-08-18 NOTE — Telephone Encounter (Signed)
No vm, sent letter, dpm

## 2011-08-18 NOTE — Progress Notes (Addendum)
[facility] [place]  Postoperative Visit - Amputation  Date of Surgery: 08/14/2011 - 08/15/2011 Procedure(s): AMPUTATION ABOVE KNEE Left Surgeon: Surgeon(s): Sherren Kerns, MD POD: 3 Days Post-Op  Subjective Yuval Rubens is a 56 y.o. male who is S/P Left Procedure(s): AMPUTATION ABOVE KNEE.  Pt.denies increased pain in the stump. The patient notes pain is well controlled. Pt. denies phantom pain.  Significant Diagnostic Studies: CBC Lab Results  Component Value Date   WBC 8.6 08/17/2011   HGB 13.1 08/17/2011   HCT 39.3 08/17/2011   MCV 79.7 08/17/2011   PLT 203 08/17/2011    BMET    Component Value Date/Time   NA 140 08/18/2011 0525   K 2.8* 08/18/2011 0525   CL 95* 08/18/2011 0525   CO2 29 08/18/2011 0525   GLUCOSE 91 08/18/2011 0525   BUN 45* 08/18/2011 0525   CREATININE 1.63* 08/18/2011 0525   CALCIUM 8.4 08/18/2011 0525   GFRNONAA 46* 08/18/2011 0525   GFRAA 53* 08/18/2011 0525    COAG Lab Results  Component Value Date   INR 1.21 08/15/2011   INR 1.24 08/14/2011   INR 0.96 02/11/2011   No results found for this basename: PTT     Intake/Output Summary (Last 24 hours) at 08/18/11 0757 Last data filed at 08/18/11 0549  Gross per 24 hour  Intake    843 ml  Output   2500 ml  Net  -1657 ml   No data found.    Physical Examination  BP Readings from Last 3 Encounters:  08/18/11 168/94  08/18/11 168/94  07/24/11 155/84   Temp Readings from Last 3 Encounters:  08/18/11 98.1 F (36.7 C) Oral  08/18/11 98.1 F (36.7 C) Oral  07/24/11 98.4 F (36.9 C) Oral   SpO2 Readings from Last 3 Encounters:  08/18/11 96%  08/18/11 96%  07/24/11 100%   Pulse Readings from Last 3 Encounters:  08/18/11 98  08/18/11 98  07/24/11 68    Pt is A&Ox3  WDWN male with no complaints  Left amputation wound is healing well.  There is good bone coverage in the stump Stump is warm and well perfused, without drainage; without erythema   Assessment/plan:  Wayman Hoard  is a 56 y.o. male who is s/p Left Procedure(s): AMPUTATION ABOVE KNEE  The patient's stump is viable.  Follow-up 4 weeks from surgery for staple removal  Pt on PO pain meds -tolerating well  To SNF when medically stable.  Daily dressing change to left leg- may leave dressing off or put stockinette over Left AKA  ROCZNIAK,REGINA J 7:57 AM 08/18/[phone]  Stephens Shire continuing to heal Will sign off Follow up 4 weeks  [name], MD [facility] [place] Office: [phone] Pager: [phone]

## 2011-08-18 NOTE — Progress Notes (Addendum)
CSW met with family and provided bed offers. Patient appeared in good spirits and reported no abuse at this time.  Patient had multiple family members in the room. Patient's brother reported that they are going to try and get the patient an apartment of his own when he is more mobile and able to take care of himself. CSW will attempt to speak with patient alone when family is not present. CSW will continue to follow and assist. With discharge needs.  Patient was offered a bed at [facility] which was requested by family.    Sabino Niemann, MSW, [employer] [phone]

## 2011-08-18 NOTE — Discharge Summary (Signed)
Physician Discharge Summary  Jeremy Howell GNF:[id number] DOB: 1955-08-13 DOA: 08/14/2011  PCP: Dorrene German, MD  Admit date: 08/14/2011 Discharge date: 08/18/2011  Discharge Diagnoses:  Principal Problem:  *ARF (acute renal failure) Active Problems:  Diabetes mellitus  Cardiomyopathy  Aphasia  Gangrene of foot  PVD (peripheral vascular disease)  Metabolic acidosis  CVA (cerebral infarction)  Rhabdomyolysis  Chronic systolic CHF (congestive heart failure)  HTN (hypertension)   Discharge Condition: fair  Diet recommendation: carb modified  History of present illness:  56 yo man with hx of stroke presented to the ED with ams, gangrene, ARF , rhabdo  Hospital Course:  1. Acute renal failure-most likely due to dehydration, rhabdomyolysis, in the setting of critical limb ischemia and gangrene of the left leg and foot. Patient is making urine. He did not develop shock or respiratory distress. Patient was started on iv NS and iv bicarbonate from admission and received these type of fluids for 48 hrs. Switched to 1/2 NS on 08/16/11. Creatinine continued to improve and iv fluids were stopped on 08/17/11.  2. Hyperkalemia k>7.5 on admission due to renal failure and rhabdomyolysis-patient was treated in emergency room with Kayexalate, bicarbonate, dextrose, insulin and a repeat basic metabolic profile continued to show elevated potassium. Bicarbonate, dextrose and insulin were given again and we were able to control the hyperkalemia without need for HD. 3. 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 . Emergent vascular surgical consultation has been obtained on 08/14/11 and the patient was taken to OR for AKA on 08/15/11 after his metabolic abnormalities have been corrected. Patient is transferring to snf for rehab post aka 4. Diabetes mellitus type 2- patient was treated with   NovoLog SSI . Added lantus 5 units qhs 08/16/11  5. Chronic systolic congestive heart failure- remained asymptomatic. Coreg 3.125 Bid resumed on 08/15/11. Titrate up Coreg to 6/25 BID 7/7 then 12.5 BID on 7/8 then 25 mg BID his home dose on 7/9.  6. HTN - initially we had to hold all meds. We are gradually titrating up Coreg - see above. Started also Norvasc 10 mg daily on 7/8. 7. Hypokalemia due to postATN polyuria - developed on 08/18/11 -  potassium was replaced  Procedures:  Left AKA  Consultations:  Renal  [facility]  Discharge Exam: Filed Vitals:   08/18/11 1359  BP: 138/85  Pulse: 101  Temp: 98.5 F (36.9 C)  Resp: 19   Filed Vitals:   08/18/11 0549 08/18/11 0639 08/18/11 0927 08/18/11 1359  BP: 174/100 168/94 136/90 138/85  Pulse: 98   101  Temp: 98.1 F (36.7 C)   98.5 F (36.9 C)  TempSrc: Oral   Oral  Resp: 18   19  Height:      Weight: 52.3 kg (115 lb 4.8 oz)     SpO2: 96%   97%   General: axox3 Cardiovascular: rrr Respiratory: ctab  Discharge Instructions  Discharge Orders    Future Appointments: Provider: Department: Dept Phone: Center:   09/24/2011 10:15 AM Sherren Kerns, MD [facility] [phone] [facility]     Medication List  As of 08/18/2011  5:53 PM   STOP taking these medications         atenolol 50 MG tablet      cloNIDine 0.1 MG tablet      furosemide 20 MG tablet      HYDROcodone-acetaminophen 5-500 MG per tablet  spironolactone 25 MG tablet         TAKE these medications         amLODipine 10 MG tablet   Commonly known as: NORVASC   Take 1 tablet (10 mg total) by mouth daily.      aspirin 325 MG tablet   Take 1 tablet (325 mg total) by mouth daily.      carvedilol 25 MG tablet   Commonly known as: COREG   Take 25 mg by mouth 2 (two) times daily with a meal.      folic acid 1 MG tablet   Commonly known as: FOLVITE   Take 1 mg by mouth daily.      insulin aspart 100 UNIT/ML injection   Commonly known as: novoLOG   Inject  0-9 Units into the skin 3 (three) times daily with meals.      insulin glargine 100 UNIT/ML injection   Commonly known as: LANTUS   Inject 5 Units into the skin at bedtime.           Follow-up Information    Follow up with Sherren Kerns, MD in 4 weeks. (office will arrange/sent)    Contact information:   [address] [place] [address] [phone]           The results of significant diagnostics from this hospitalization (including imaging, microbiology, ancillary and laboratory) are listed below for reference.    Significant Diagnostic Studies: Dg Chest 1 View  08/14/2011  *RADIOLOGY REPORT*  Clinical Data: Injury  CHEST - 1 VIEW  Comparison: 02/11/2011  Findings: Mild cardiomegaly.  Clear lungs.  No definite acute bony deformity.  Lytic bone lesion in the proximal humerus is not significantly changed.  IMPRESSION: Cardiomegaly without edema.  No active cardiopulmonary disease.  Original Report Authenticated By: Donavan Burnet, M.D.   Dg Pelvis 1-2 Views  08/14/2011  *RADIOLOGY REPORT*  Clinical Data: Pelvic pain  PELVIS - 1-2 VIEW  Comparison: 03/23/2003  Findings: There is been previous acetabular reconstruction and ORIF of a proximal humeral fracture.  There is advanced degenerative arthropathy of the hip and the hip joint may be actually fused.  I do not see any acute finding in that region.  The right hip shows small marginal osteophytes but no significant disc space narrowing. Sacroiliac joints and symphysis pubis appear normal.  IMPRESSION: No acute finding.  Postsurgical and post-traumatic deformity of the left hip region with degenerative change and possible fusion.  Original Report Authenticated By: Thomasenia Sales, M.D.   Ct Head Wo Contrast  07/21/2011  *RADIOLOGY REPORT*  Clinical Data: Altered mental status, hypertension.  CT HEAD WITHOUT CONTRAST  Technique:  Contiguous axial images were obtained from the base of the skull through the vertex without  contrast.  Comparison: 02/11/2011  Findings: Left frontal temporal encephalomalacia.  Focal right parietal lobe hypoattenuation is unchanged and may reflect remote infarct. Periventricular and subcortical white matter hypodensities are most in keeping with chronic microangiopathic change. There is no evidence for acute hemorrhage, hydrocephalus, mass lesion, or abnormal extra-axial fluid collection.  No definite CT evidence for acute infarction.  The visualized paranasal sinuses and mastoid air cells are predominately clear.  IMPRESSION: Remote infarctions and white matter changes.  No definite acute intracranial abnormality.  Original Report Authenticated By: Waneta Martins, M.D.    Microbiology: Recent Results (from the past 240 hour(s))  URINE CULTURE     Status: Normal   Collection Time   08/14/11 12:39 PM  Component Value Range Status Comment   Specimen Description URINE, CATHETERIZED   Final    Special Requests NONE   Final    Culture  Setup Time 08/14/2011 13:16   Final    Colony Count NO GROWTH   Final    Culture NO GROWTH   Final    Report Status 08/15/2011 FINAL   Final   MRSA PCR SCREENING     Status: Normal   Collection Time   08/14/11  9:41 PM      Component Value Range Status Comment   MRSA by PCR NEGATIVE  NEGATIVE Final      Labs: Basic Metabolic Panel:  Lab 08/18/11 1610 08/17/11 0600 08/16/11 0415 08/15/11 1130 08/15/11 0430  NA 140 143 149* 149* 149*  K 2.8* 3.5 3.5 4.3 4.1  CL 95* 99 104 106 106  CO2 29 28 31 25 22   GLUCOSE 91 97 157* 138* 137*  BUN 45* 64* 106* 135* 142*  CREATININE 1.63* 2.53* 5.59* 8.69* 9.97*  CALCIUM 8.4 8.3* 7.7* 7.8* 8.3*  MG -- -- -- -- --  PHOS -- -- -- -- --   Liver Function Tests:  Lab 08/15/11 0430 08/14/11 1229  AST 160* 232*  ALT 96* 120*  ALKPHOS 74 87  BILITOT 0.3 0.4  PROT 7.0 8.2  ALBUMIN 2.9* 3.4*   No results found for this basename: LIPASE:5,AMYLASE:5 in the last 168 hours No results found for this basename:  AMMONIA:5 in the last 168 hours CBC:  Lab 08/17/11 0600 08/16/11 0415 08/15/11 1130 08/15/11 0430 08/14/11 1335  WBC 8.6 7.5 8.3 9.5 13.5*  NEUTROABS -- -- -- -- 10.3*  HGB 13.1 13.0 13.6 14.1 16.8  HCT 39.3 37.3* 39.4 40.0 48.6  MCV 79.7 78.0 78.0 76.8* 79.2  PLT 203 173 182 193 179   Cardiac Enzymes:  Lab 08/14/11 1245 08/14/11 1229  CKTOTAL -- 13842*  CKMB -- --  CKMBINDEX -- --  TROPONINI 0.30* --   BNP: BNP (last 3 results)  Basename 02/14/11 0500 02/13/11 0830  PROBNP 104.1 182.0*   CBG:  Lab 08/18/11 1629 08/18/11 1145 08/18/11 0606 08/17/11 2120 08/17/11 1611  GLUCAP 109* 113* 96 112* 106*    Time coordinating discharge: 1 hour  Signed:  Mikah Poss  [facility] 08/18/2011, 5:53 PM

## 2011-08-19 DIAGNOSIS — E875 Hyperkalemia: Secondary | ICD-10-CM

## 2011-08-19 LAB — BASIC METABOLIC PANEL
BUN: 33 mg/dL — ABNORMAL HIGH (ref 6–23)
CO2: 29 mEq/L (ref 19–32)
Chloride: 100 mEq/L (ref 96–112)
Creatinine, Ser: 1.37 mg/dL — ABNORMAL HIGH (ref 0.50–1.35)
Glucose, Bld: 101 mg/dL — ABNORMAL HIGH (ref 70–99)
Potassium: 3.5 mEq/L (ref 3.5–5.1)

## 2011-08-19 LAB — GLUCOSE, CAPILLARY

## 2011-08-19 MED ORDER — OXYCODONE HCL 5 MG PO TABS: 5.0000 mg | ORAL_TABLET | Freq: Three times a day (TID) | ORAL | Status: AC | PRN

## 2011-08-19 MED ORDER — INSULIN ASPART 100 UNIT/ML ~~LOC~~ SOLN: SUBCUTANEOUS | Status: DC

## 2011-08-19 NOTE — Progress Notes (Signed)
[facility] PROGRESS NOTE  Jeremy Howell ZOX:[id number] DOB: [birth year] DOA: 08/14/2011 PCP: Dorrene German, MD   Followup recommendations: 1. CMP in 4-5 days from hospital discharge. 2. Followup with Dr. [name] in 4 weeks from surgery for staple removal.   Assessment/Plan: Principal Problem:  *ARF (acute renal failure) Active Problems:  Diabetes mellitus  Cardiomyopathy  Aphasia  Gangrene of foot  PVD (peripheral vascular disease)  Metabolic acidosis  CVA (cerebral infarction)  Rhabdomyolysis  Chronic systolic CHF (congestive heart failure)  HTN (hypertension)  1. Acute renal failure-most likely due to dehydration, rhabdomyolysis, in the setting of critical limb ischemia and gangrene of the left leg and foot. Patient is nonoliguric. He was treated conservatively with IV fluids and did not require hemodialysis. His creatinine improved from 12 on 08/14/11 to 1.37 on 08/19/11. His acute renal failure is almost resolved. 2. Hyperkalemia k>7.5 on admission: due to renal failure and rhabdomyolysis-patient was treated in emergency room with Kayexalate, bicarbonate, dextrose, insulin and a repeat basic metabolic profile continued to show elevated potassium. Bicarbonate, dextrose and insulin were given again and we were able to control the hyperkalemia without need for HD. Resolved 3. 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 . Emergent vascular surgical consultation was obtained on 08/14/11 and the patient was taken to OR for AKA on 08/15/11 after his metabolic abnormalities  were corrected. Patient is transferring to snf for rehab post aka. 4. Diabetes mellitus type 2- patient was treated with NovoLog SSI . Added lantus 5 units qhs 08/16/11. Good inpatient control.  5. Chronic systolic congestive heart failure- remained asymptomatic. Coreg 3.125 Bid resumed on  08/15/11. Titrated up to 25 mg BID his home dose on 7/9.  6. HTN - initially we had to held all meds. Coreg resumed. Started also Norvasc 10 mg daily on 7/8. 7. Hypokalemia due to postATN polyuria - developed on 08/18/11 - potassium was replaced 8. Mild transaminitis: Likely secondary to rhabdomyolysis. Improving. 9. 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  Code Status: Full Family Communication:  Disposition Plan: Discharge to skilled nursing facility in stable condition.  Brief narrative: 56 year old gentleman who has suffered a stroke back in January and has residual apahsia, was brought in by his family for decreased level of consciousness, lethargic and weaker than before. In the emergency room the patient was found to have a cold left foot, acute renal failure, significant rhabdomyolysis and he was referred for admission   Consultants:  Nephrology.  Vascular surgery.  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  Antibiotics:  A single dose of IV cefuroxime was used perioperatively.  Diet:  Dysphagia 3 diet with diabetic and heart healthy modifications.  Dressing:  Daily dressing change to left leg- may leave dressing off or put stockinette over Left AKA   HPI/Subjective: Patient denies complaints. Denies pain or dyspnea. No acute events as per nursing.  Objective: Filed Vitals:   08/18/11 1359 08/18/11 2046 08/19/11 0454 08/19/11 1034  BP: 138/85 130/82 146/84 133/87  Pulse: 101 94 87   Temp: 98.5 F (36.9 C) 98.9 F (37.2 C) 98.7 F (37.1 C)   TempSrc: Oral Oral Oral   Resp: 19 18 16    Height:      Weight:   51.8 kg (114 lb 3.2 oz)   SpO2: 97% 98% 99%     Intake/Output Summary (Last 24 hours) at 08/19/11 1211 Last data  filed at 08/19/11 1036  Gross per 24 hour  Intake    839 ml  Output    850 ml  Net    -11 ml    Exam:   General:  Comfortable.  Cardiovascular: First and second heart sounds heard, regular rate  and rhythm. Telemetry shows mostly sinus rhythm and occasional sinus tachycardia with activity. No JVD, pedal edema or murmurs.  Respiratory: Clear to auscultation. No increased work of breathing  Abdomen: Nondistended, nontender, soft and normal bowel sounds heard.  Central nervous system: Alert and oriented. Aphasia (not new).  Extremities: Right extremities grade 4/5 power.? Contracture of right fingers. Left AKA site is clean, dry and intact.  Data Reviewed: Basic Metabolic Panel:  Lab 08/19/11 6962 08/18/11 0525 08/17/11 0600 08/16/11 0415 08/15/11 1130  NA 141 140 143 149* 149*  K 3.5 2.8* 3.5 3.5 4.3  CL 100 95* 99 104 106  CO2 29 29 28 31 25   GLUCOSE 101* 91 97 157* 138*  BUN 33* 45* 64* 106* 135*  CREATININE 1.37* 1.63* 2.53* 5.59* 8.69*  CALCIUM 8.3* 8.4 8.3* 7.7* 7.8*  MG -- -- -- -- --  PHOS -- -- -- -- --   Liver Function Tests:  Lab 08/15/11 0430 08/14/11 1229  AST 160* 232*  ALT 96* 120*  ALKPHOS 74 87  BILITOT 0.3 0.4  PROT 7.0 8.2  ALBUMIN 2.9* 3.4*   No results found for this basename: LIPASE:5,AMYLASE:5 in the last 168 hours No results found for this basename: AMMONIA:5 in the last 168 hours CBC:  Lab 08/17/11 0600 08/16/11 0415 08/15/11 1130 08/15/11 0430 08/14/11 1335  WBC 8.6 7.5 8.3 9.5 13.5*  NEUTROABS -- -- -- -- 10.3*  HGB 13.1 13.0 13.6 14.1 16.8  HCT 39.3 37.3* 39.4 40.0 48.6  MCV 79.7 78.0 78.0 76.8* 79.2  PLT 203 173 182 193 179   Cardiac Enzymes:  Lab 08/14/11 1245 08/14/11 1229  CKTOTAL -- 13842*  CKMB -- --  CKMBINDEX -- --  TROPONINI 0.30* --   BNP (last 3 results)  Basename 02/14/11 0500 02/13/11 0830  PROBNP 104.1 182.0*   CBG:  Lab 08/19/11 1103 08/19/11 0545 08/18/11 2049 08/18/11 1629 08/18/11 1145  GLUCAP 110* 95 126* 109* 113*    Recent Results (from the past 240 hour(s))  URINE CULTURE     Status: Normal   Collection Time   08/14/11 12:39 PM      Component Value Range Status Comment   Specimen Description  URINE, CATHETERIZED   Final    Special Requests NONE   Final    Culture  Setup Time 08/14/2011 13:16   Final    Colony Count NO GROWTH   Final    Culture NO GROWTH   Final    Report Status 08/15/2011 FINAL   Final   MRSA PCR SCREENING     Status: Normal   Collection Time   08/14/11  9:41 PM      Component Value Range Status Comment   MRSA by PCR NEGATIVE  NEGATIVE Final      Studies: No results found.   Radiology:  1. Chest x-ray on 08/14/11: IMPRESSION:  Cardiomegaly without edema. No active cardiopulmonary disease 2. X-ray of the pelvis on 08/14/11: IMPRESSION: No acute finding. Postsurgical and post-traumatic deformity of the  left hip region with degenerative change and possible fusion.   Scheduled Meds:    . amLODipine  10 mg Oral Daily  . antiseptic oral rinse  15 mL  Mouth Rinse BID  . aspirin  325 mg Oral Daily  . carvedilol  25 mg Oral BID WC  . docusate sodium  100 mg Oral Daily  . folic acid  1 mg Oral Daily  . heparin  5,000 Units Subcutaneous Q8H  . insulin aspart  0-9 Units Subcutaneous TID WC  . insulin glargine  5 Units Subcutaneous QHS  . nutrition supplement  1 packet Oral BID BM  . pantoprazole  40 mg Oral Q1200  . potassium chloride  40 mEq Oral BID  . sodium chloride  3 mL Intravenous Q12H   Continuous Infusions:    [facility] Pager [phone]   If 7PM-7AM, please contact night-coverage [email or web address] Password [email or web address] 08/19/2011, 12:11 PM   LOS: 5 days

## 2011-08-20 NOTE — Progress Notes (Signed)
Clinical social worker assisted with patient discharge to skilled nursing facility, [facility].  CSW addressed all family questions and concerns. CSW copied chart and added all important documents. CSW also set up patient transportation with [facility]. Clinical Social Worker will sign off for now as social work intervention is no longer needed.   Osmar Howton, MSW, LCSWA [phone] 
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
## 2011-08-20 NOTE — Progress Notes (Signed)
Progress Note  08/19/11 1817  PT Visit Information  Last PT Received On 08/20/11  Assistance Needed +1  Precautions  Precautions Fall  Restrictions  Weight Bearing Restrictions No  Cognition  Overall Cognitive Status Difficult to assess  Difficult to assess due to Impaired communication  Arousal/Alertness Awake/alert  Orientation Level Disoriented to;Time;Place;Situation  Behavior During Session [facility] for tasks performed  Bed Mobility  Bed Mobility Supine to Sit;Sitting - Scoot to Edge of Bed  Supine to Sit 2: Max assist  Supine to Sit: Patient Percentage 30%  Sitting - Scoot to Edge of Bed 2: Max assist  Sit to Supine Not Tested (comment)  Details for Bed Mobility Assistance Pt continues to push into extension with trunk.   Transfers  Transfers Sit to Stand;Stand to [employer] Transfers  Sit to Stand 2: Max assist;With upper extremity assist;From chair/3-in-1;From bed;Other (comment)  Sit to Stand: Patient Percentage 40%  Stand to Sit 2: Max assist  Stand to Sit: Patient Percentage 40%  Stand Pivot Transfers 1: +2 Total assist  Stand Pivot Transfers: Patient Percentage 40%  Details for Transfer Assistance From EOB pt able to stand pulling on therpist with his left  UE and support his weight through is R LE.  Assist to intiate anterior wt shift and tactile cueing for Trunk extension. Pt unable to achieve full extension of R LE. (c/o pain in supine when RLE fully extended.)  Ambulation/Gait  Ambulation/Gait Assistance Not tested (comment)  Wheelchair Mobility  Wheelchair Mobility No  Balance  Balance Assessed No  PT - End of Session  Equipment Utilized During Treatment Gait belt  Activity Tolerance Patient limited by pain;Treatment limited secondary to medical complications (Comment)  Patient left in chair;with call bell/phone within reach  Nurse Communication Mobility status  PT - Assessment/Plan  PT Plan Discharge plan remains appropriate;Frequency remains appropriate    PT Frequency Min 3X/week  Follow Up Recommendations Skilled nursing facility  Equipment Recommended Defer to next venue  Acute Rehab PT Goals  PT Goal Formulation Patient unable to participate in goal setting  Time For Goal Achievement 08/30/11  Potential to Achieve Goals Good  Pt will go Supine/Side to Sit with min assist;with rail;with HOB 0 degrees  PT Goal: Supine/Side to Sit - Progress Progressing toward goal  Pt will Sit at [facility] of Bed with supervision;3-5 min;with unilateral upper extremity support  PT Goal: Sit at Edge Of Bed - Progress Progressing toward goal  Pt will go Sit to Supine/Side with min assist;with HOB 0 degrees;with rail  PT General Charges  $$ ACUTE PT VISIT 1 Procedure  PT Treatments  $Therapeutic Activity 8-22 mins   Dahlila Pfahler L. Nayelis Bonito DPT [phone]

## 2011-08-24 ENCOUNTER — Telehealth: Payer: Self-pay | Admitting: Vascular Surgery

## 2011-08-24 NOTE — Telephone Encounter (Addendum)
Message copied by Shari Prows on Mon Aug 24, 2011  4:20 PM ------      Message from: Melene Plan      Created: Mon Aug 24, 2011  2:24 PM                   ----- Message -----         From: [place], [place]         Sent: 08/18/2011  12:35 PM           To: Melene Plan, RN            4 week F/U left [name] - has Staples  Pt has an appt scheduled for 09/24/11 at 10:15am w/ [facility]. Attempted to leave message but person answering phone was unable to take message/mailed appt letter/awt

## 2011-09-23 ENCOUNTER — Encounter: Payer: Self-pay | Admitting: Vascular Surgery

## 2011-09-24 ENCOUNTER — Encounter: Payer: Self-pay | Admitting: Vascular Surgery

## 2011-09-24 ENCOUNTER — Ambulatory Visit (INDEPENDENT_AMBULATORY_CARE_PROVIDER_SITE_OTHER): Payer: Medicare Other | Admitting: Vascular Surgery

## 2011-09-24 VITALS — BP 144/78 | HR 77 | Temp 98.2°F | Resp 18

## 2011-09-24 DIAGNOSIS — I70269 Atherosclerosis of native arteries of extremities with gangrene, unspecified extremity: Secondary | ICD-10-CM | POA: Insufficient documentation

## 2011-09-24 DIAGNOSIS — Z48812 Encounter for surgical aftercare following surgery on the circulatory system: Secondary | ICD-10-CM | POA: Insufficient documentation

## 2011-09-24 NOTE — Progress Notes (Addendum)
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 He returns today for further followup. He has some aphasia from a prior stroke.  He has a family member with him here today to help communicate for him. He is working on transferring bed to chair. He is not currently ambulatory. He does have some baseline right-sided weakness from his prior stroke. He complains of pain intermittently in the right first toe. He had severe renal dysfunction, rhabdomyolysis during his previous hospital admission. His renal function has apparently improved at this point.  Physical exam: Filed Vitals:   09/24/11 1034  BP: 144/78  Pulse: 77  Temp: 98.2 F (36.8 C)  TempSrc: Oral  Resp: 18  SpO2: 98%   Extremities: 2+ femoral pulses bilaterally, well-healed left above-knee amputation staples removed today, popliteal and pedal pulses non-palpable right foot  Skin: No ulcer or rash  Neuro: The patient is able to fully extend and flex his right lower extremity. He has 4/5 motor strength.  Assessment: Healing left above-knee amputation, signs and symptoms of possible early rest pain right foot with pulse exam significant for possible ischemia  Plan: The patient will be scheduled for aortogram with runoff to evaluate his right lower extremity in the near future. We will give him a few more weeks to continue to recover from his amputation as well as allow his kidneys to continue their recovery.  Risks benefits possible complications and procedure details of the arteriogram were trying to the patient and his family today.  [name], MD [facility] Office: [phone] Pager: [phone]   Vein mapping on 10/22/11 shows greater than 3mm vein to knee then small in the calf but > 2mm.  Will have conduit but probably will need some PTFE for full length requiring composite.  [name], MD [facility] [place] Office:  [phone] Pager: [phone]

## 2011-09-25 ENCOUNTER — Other Ambulatory Visit: Payer: Self-pay | Admitting: *Deleted

## 2011-09-25 ENCOUNTER — Encounter: Payer: Self-pay | Admitting: *Deleted

## 2011-10-01 ENCOUNTER — Encounter (HOSPITAL_COMMUNITY): Payer: Self-pay | Admitting: Pharmacy Technician

## 2011-10-16 ENCOUNTER — Ambulatory Visit (HOSPITAL_COMMUNITY)
Admission: RE | Admit: 2011-10-16 | Discharge: 2011-10-16 | Disposition: A | Discharge status: Home / Self Care | Payer: Medicare Other | Source: Ambulatory Visit | Attending: Vascular Surgery | Admitting: Vascular Surgery

## 2011-10-16 ENCOUNTER — Other Ambulatory Visit: Payer: Self-pay | Admitting: *Deleted

## 2011-10-16 ENCOUNTER — Encounter (HOSPITAL_COMMUNITY)
Admission: RE | Disposition: A | Discharge status: Home / Self Care | Payer: Self-pay | Source: Ambulatory Visit | Attending: Vascular Surgery

## 2011-10-16 DIAGNOSIS — I739 Peripheral vascular disease, unspecified: Secondary | ICD-10-CM

## 2011-10-16 DIAGNOSIS — S78119A Complete traumatic amputation at level between unspecified hip and knee, initial encounter: Secondary | ICD-10-CM | POA: Insufficient documentation

## 2011-10-16 DIAGNOSIS — M79609 Pain in unspecified limb: Secondary | ICD-10-CM | POA: Insufficient documentation

## 2011-10-16 DIAGNOSIS — Z01818 Encounter for other preprocedural examination: Secondary | ICD-10-CM

## 2011-10-16 DIAGNOSIS — I70229 Atherosclerosis of native arteries of extremities with rest pain, unspecified extremity: Secondary | ICD-10-CM

## 2011-10-16 DIAGNOSIS — Z01810 Encounter for preprocedural cardiovascular examination: Secondary | ICD-10-CM

## 2011-10-16 HISTORY — PX: ABDOMINAL AORTAGRAM: SHX5454

## 2011-10-16 LAB — POCT I-STAT, CHEM 8
Chloride: 106 mEq/L (ref 96–112)
Glucose, Bld: 83 mg/dL (ref 70–99)
HCT: 44 % (ref 39.0–52.0)
Potassium: 4.5 mEq/L (ref 3.5–5.1)
Sodium: 141 mEq/L (ref 135–145)

## 2011-10-16 SURGERY — ABDOMINAL AORTAGRAM
Anesthesia: LOCAL

## 2011-10-16 MED ORDER — SODIUM CHLORIDE 0.9 % IV SOLN
INTRAVENOUS | Status: DC
  Administered 2011-10-16: 07:00:00 via INTRAVENOUS

## 2011-10-16 MED ORDER — HEPARIN (PORCINE) IN NACL 2-0.9 UNIT/ML-% IJ SOLN
INTRAMUSCULAR | Status: AC
  Filled 2011-10-16: qty 1000

## 2011-10-16 MED ORDER — LIDOCAINE HCL (PF) 1 % IJ SOLN
INTRAMUSCULAR | Status: AC
  Filled 2011-10-16: qty 30

## 2011-10-16 NOTE — Op Note (Signed)
Procedure: Aortogram with right lower extremity runoff  Preoperative diagnosis: Rest pain right foot  Postoperative diagnosis: Same  Anesthesia: Local  Operative findings: #1 patent aortoiliac system #2 right popliteal artery occlusion #3 two-vessel runoff via the peroneal and posterior tibial arteries with the posterior tibial artery being the dominant vessel  Operative details: After informed consent, the patient was taken to the PV lab. The patient was placed in supine position on the Angio table. Both groins were prepped and draped in the usual sterile fashion. Local anesthesia was infiltrated over the left common femoral artery. An introducer needle was used to cannulate the left common femoral artery without difficulty. An 16 Versacore wire was then threaded and the abdominal aorta under fluoroscopic guidance. The 5 French sheath was thoroughly flushed with heparinized saline. A 5 French pigtail catheter was then placed over the guidewire and advanced into the abdominal aorta under fluoroscopic guidance. Abdominal aortogram was obtained in an AP projection. The left and right renal arteries are patent. The infrarenal abdominal aorta is irregular but no significant flow-limiting stenosis. The left and right common iliac arteries are patent. The left and right external and internal iliac arteries are patent.  Next the catheter was pulled down just above the aortic bifurcation and an oblique view of the pelvis was obtained to further visualize the left external iliac due to overlying orthopedic hardware. The left and right external iliac arteries are widely patent. The left and right common iliac arteries are widely patent.  Next the pigtail catheter was removed over a guidewire. A 5 French crossover catheter was brought up on the operative field and an attempt was made to selectively catheterize the right common iliac artery. However I was unable to get the catheter to engage fully in the right  common iliac artery. Therefore the crossover catheter was exchanged over a guidewire for a 5 [place] SOS catheter. This was then used to selectively catheterize the right common iliac artery with minimal difficulty. The Versacore wire was advanced deep down into the right external iliac artery. The SOS catheter was removed over a guidewire and replaced with a 5 French straight catheter. This was advanced down into the distal right external iliac artery.  Next the right lower extremity runoff was obtained. The right common femoral artery is patent. The right profunda femoris artery is patent. The right superficial femoral artery is patent but there is approximately 30% narrowing and very irregular at the proximal aspect of the superficial femoral artery. There is a 70% stenosis of the midsegment of the right superficial femoral artery. The right popliteal artery is occluded. There is reconstitution of the peroneal artery with very sluggish flow. There is also reconstitution of the posterior tibial artery which is the dominant runoff vessel to the right foot. The anterior tibial artery is occluded.  At this point the 5 French straight catheter was removed and the 5 [place] sheath thoroughly flushed with heparin saline. The patient tolerated procedure well and there were no complications. The sheath was left in place to be pulled in the holding area.  Operative management: The patient will be scheduled in the near future for a right femoral to posterior tibial artery bypass after evaluation for cardiac risk stratification.  [name], MD [facility] Office: [phone] Pager: [phone]

## 2011-10-16 NOTE — H&P (View-Only) (Signed)
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 He returns today for further followup. He has some aphasia from a prior stroke.  He has a family member with him here today to help communicate for him. He is working on transferring bed to chair. He is not currently ambulatory. He does have some baseline right-sided weakness from his prior stroke. He complains of pain intermittently in the right first toe. He had severe renal dysfunction, rhabdomyolysis during his previous hospital admission. His renal function has apparently improved at this point.  Physical exam: Filed Vitals:   09/24/11 1034  BP: 144/78  Pulse: 77  Temp: 98.2 F (36.8 C)  TempSrc: Oral  Resp: 18  SpO2: 98%   Extremities: 2+ femoral pulses bilaterally, well-healed left above-knee amputation staples removed today, popliteal and pedal pulses non-palpable right foot  Skin: No ulcer or rash  Neuro: The patient is able to fully extend and flex his right lower extremity. He has 4/5 motor strength.  Assessment: Healing left above-knee amputation, signs and symptoms of possible early rest pain right foot with pulse exam significant for possible ischemia  Plan: The patient will be scheduled for aortogram with runoff to evaluate his right lower extremity in the near future. We will give him a few more weeks to continue to recover from his amputation as well as allow his kidneys to continue their recovery.  Risks benefits possible complications and procedure details of the arteriogram were trying to the patient and his family today.  [name], MD [facility] Office: [phone] Pager: [phone]

## 2011-10-16 NOTE — Interval H&P Note (Signed)
History and Physical Interval Note:  10/16/2011 7:39 AM  Jeremy Howell  has presented today for surgery, with the diagnosis of PVD  The various methods of treatment have been discussed with the patient and family. After consideration of risks, benefits and other options for treatment, the patient has consented to  Procedure(s) (LRB) with comments: ABDOMINAL AORTAGRAM (N/A) as a surgical intervention .  The patient's history has been reviewed, patient examined, no change in status, stable for surgery.  I have reviewed the patient's chart and labs.  Questions were answered to the patient's satisfaction.     Aidian Salomon,Okey E

## 2011-10-21 ENCOUNTER — Ambulatory Visit (INDEPENDENT_AMBULATORY_CARE_PROVIDER_SITE_OTHER): Payer: Medicare Other | Admitting: Cardiology

## 2011-10-21 ENCOUNTER — Encounter: Payer: Self-pay | Admitting: Cardiology

## 2011-10-21 VITALS — BP 130/80 | HR 78

## 2011-10-21 DIAGNOSIS — I10 Essential (primary) hypertension: Secondary | ICD-10-CM

## 2011-10-21 DIAGNOSIS — Z01810 Encounter for preprocedural cardiovascular examination: Secondary | ICD-10-CM

## 2011-10-21 DIAGNOSIS — I428 Other cardiomyopathies: Secondary | ICD-10-CM

## 2011-10-21 DIAGNOSIS — I429 Cardiomyopathy, unspecified: Secondary | ICD-10-CM

## 2011-10-21 DIAGNOSIS — I739 Peripheral vascular disease, unspecified: Secondary | ICD-10-CM

## 2011-10-21 MED ORDER — PRAVASTATIN SODIUM 40 MG PO TABS: 40.0000 mg | ORAL_TABLET | Freq: Every evening | ORAL | Status: DC

## 2011-10-21 NOTE — Progress Notes (Signed)
HPI: 56 year old male for preoperative evaluation prior to peripheral vascular surgery. Patient has a history of cardiomyopathy felt possibly secondary to hypertension. Echocardiogram in January of 2013 showed an ejection fraction of 30-35%. There was severe left ventricular hypertrophy, mild left atrial enlargement and grade 1 diastolic dysfunction. Carotid Dopplers in January 2013 were normal. Patient admitted in July of 2013 with critical limb ischemia and rhabdomyolysis. 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 His renal function improved with therapy. He was noted to have severe peripheral vascular disease and had followup arteriogram. He will require surgical intervention and cardiology was asked to evaluate preoperatively. He denies dyspnea, chest pain or syncope. Note his initial laboratories showed a BUN of 142 and a creatinine of 9.97. This improved and his most recent BUN and creatinine were 23 and 1.20. This was performed on 10/16/2011.  Current Outpatient Prescriptions  Medication Sig Dispense Refill  . amLODipine (NORVASC) 10 MG tablet Take 1 tablet (10 mg total) by mouth daily.      Marland Kitchen aspirin 325 MG tablet Take 1 tablet (325 mg total) by mouth daily.      . carvedilol (COREG) 25 MG tablet Take 25 mg by mouth 2 (two) times daily with a meal.      . folic acid (FOLVITE) 1 MG tablet Take 1 mg by mouth daily.        . insulin aspart (NOVOLOG) 100 UNIT/ML injection 0-9 Units, Subcutaneous, 3 times daily with meals,  CBG < 70: implement hypoglycemia protocol CBG 70 - 120: 0 units CBG 121 - 150: 1 unit CBG 151 - 200: 2 units CBG 201 - 250: 3 units CBG 251 - 300: 5 units CBG 301 - 350: 7 units CBG 351 - 400: 9 units CBG > 400: call MD.      . insulin glargine (LANTUS) 100 UNIT/ML injection Inject 5 Units into the skin at bedtime.  10 mL  0  . nicotine (NICODERM CQ - DOSED IN MG/24 HOURS) 21 mg/24hr patch Place 1 patch onto the skin daily.         Past Medical  History  Diagnosis Date  . Cardiomyopathy     EF 20-25% by echo 01/2010 with severe LVH felt secondary to substance abuse (no ischemic workup)  . 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 Diabetes mellitus   . HTN (hypertension)   . Polysubstance abuse     Reported hx of cocaine, THC, heroin, EtOH abuse  . Noncompliance   . Acute renal failure     Past Surgical History  Procedure Date  . Total hip arthroplasty   . Amputation 08/15/2011    Procedure: AMPUTATION ABOVE KNEE;  Surgeon: Sherren Kerns, MD;  Location: [facility] OR;  Service: Vascular;  Laterality: Left;  Amputation End:  1610    History   Social History  . Marital Status: Single    Spouse Name: N/A    Number of Children: N/A  . Years of Education: N/A   Occupational History  . Not on file.   Social History Main Topics  . Smoking status: Current Every Day Smoker -- 0.5 packs/day for 15 years    Types: Cigarettes  . Smokeless tobacco: Current User   Comment: declined  . Alcohol Use: 1.0 oz/week    2 drink(s) per week  . Drug Use: No     + previous  history  . Sexually Active: Not on file   Other Topics Concern  . Not on file   Social History Narrative   Lives with sister     ROS: no fevers or chills, productive cough, hemoptysis, dysphasia, odynophagia, melena, hematochezia, dysuria, hematuria, rash, seizure activity, orthopnea, PND, pedal edema, claudication. Remaining systems are negative.  Physical Exam: Well-developed well-nourished in no acute distress.  Skin is warm and dry.  HEENT is normal.  Neck is supple.  Chest is clear to auscultation with normal expansion.  Cardiovascular exam is regular rate and rhythm.  Abdominal exam nontender or distended. No masses palpated. Extremities show no edema. Status post left AKA neuro decreased strength in right upper extremity and dysarthria.   08/14/2011-sinus rhythm, IVCD, nonspecific ST  changes.

## 2011-10-21 NOTE — Assessment & Plan Note (Signed)
Continue aspirin. Add statin. Begin Pravachol 40 mg daily and check lipids and liver in 6 weeks. 

## 2011-10-21 NOTE — Patient Instructions (Addendum)
Your physician recommends that you schedule a follow-up appointment in: 6 WEEKS WITH DR CRENSHAW IN [place]  Your physician has requested that you have a lexiscan myoview. For further information please visit [email or web address] Please follow instruction sheet, as given.    START PRAVASTATIN 40 MG ONCE DAILY AT BEDTIME

## 2011-10-21 NOTE — Assessment & Plan Note (Signed)
Patient with multiple risk factors and also known cardiomyopathy without previous ischemia evaluation. He is undergoing a high risk surgical procedure. Ordinarily I would most likely proceed with cardiac catheterization to exclude coronary disease particularly in light of his reduced LV function. I am hesitant to do this at this point given his recent acute rhabdomyolysis and renal insufficiency. Note his renal function has improved. I will plan to proceed with a Lexiscan myoview. We will have a low threshold for cardiac catheterization.

## 2011-10-21 NOTE — Assessment & Plan Note (Signed)
Blood pressure controlled. Continue present medications. 

## 2011-10-21 NOTE — Assessment & Plan Note (Signed)
Most likely related to hypertension. Continue beta blocker and Norvasc. I will plan to repeat laboratories when he returns in 6 weeks. If his potassium and renal function continued to be normal we will consider an ACE inhibitor or ARB at that time. Plan repeat echocardiogram in the future once blood pressure controlled to see if LV function improves.

## 2011-10-22 ENCOUNTER — Encounter (INDEPENDENT_AMBULATORY_CARE_PROVIDER_SITE_OTHER): Payer: Medicare Other | Admitting: *Deleted

## 2011-10-22 ENCOUNTER — Inpatient Hospital Stay (HOSPITAL_COMMUNITY)
Admission: EM | Admit: 2011-10-22 | Discharge: 2011-10-29 | DRG: 871 | Disposition: A | Discharge status: Skilled Nursing Facility | Payer: Medicare Other | Attending: Internal Medicine | Admitting: Internal Medicine

## 2011-10-22 ENCOUNTER — Encounter (HOSPITAL_COMMUNITY): Payer: Self-pay | Admitting: *Deleted

## 2011-10-22 ENCOUNTER — Emergency Department (HOSPITAL_COMMUNITY): Payer: Medicare Other

## 2011-10-22 DIAGNOSIS — I739 Peripheral vascular disease, unspecified: Secondary | ICD-10-CM

## 2011-10-22 DIAGNOSIS — R4701 Aphasia: Secondary | ICD-10-CM

## 2011-10-22 DIAGNOSIS — E872 Acidosis, unspecified: Secondary | ICD-10-CM

## 2011-10-22 DIAGNOSIS — Z9119 Patient's noncompliance with other medical treatment and regimen: Secondary | ICD-10-CM

## 2011-10-22 DIAGNOSIS — R4182 Altered mental status, unspecified: Secondary | ICD-10-CM

## 2011-10-22 DIAGNOSIS — I161 Hypertensive emergency: Secondary | ICD-10-CM

## 2011-10-22 DIAGNOSIS — N183 Chronic kidney disease, stage 3 unspecified: Secondary | ICD-10-CM | POA: Diagnosis present

## 2011-10-22 DIAGNOSIS — Z96649 Presence of unspecified artificial hip joint: Secondary | ICD-10-CM

## 2011-10-22 DIAGNOSIS — I5022 Chronic systolic (congestive) heart failure: Secondary | ICD-10-CM | POA: Diagnosis present

## 2011-10-22 DIAGNOSIS — I1310 Hypertensive heart and chronic kidney disease without heart failure, with stage 1 through stage 4 chronic kidney disease, or unspecified chronic kidney disease: Secondary | ICD-10-CM | POA: Diagnosis present

## 2011-10-22 DIAGNOSIS — M6282 Rhabdomyolysis: Secondary | ICD-10-CM

## 2011-10-22 DIAGNOSIS — Z8673 Personal history of transient ischemic attack (TIA), and cerebral infarction without residual deficits: Secondary | ICD-10-CM

## 2011-10-22 DIAGNOSIS — I69320 Aphasia following cerebral infarction: Secondary | ICD-10-CM | POA: Diagnosis present

## 2011-10-22 DIAGNOSIS — B958 Unspecified staphylococcus as the cause of diseases classified elsewhere: Secondary | ICD-10-CM

## 2011-10-22 DIAGNOSIS — R7303 Prediabetes: Secondary | ICD-10-CM | POA: Diagnosis present

## 2011-10-22 DIAGNOSIS — I10 Essential (primary) hypertension: Secondary | ICD-10-CM

## 2011-10-22 DIAGNOSIS — N179 Acute kidney failure, unspecified: Secondary | ICD-10-CM

## 2011-10-22 DIAGNOSIS — I70269 Atherosclerosis of native arteries of extremities with gangrene, unspecified extremity: Secondary | ICD-10-CM

## 2011-10-22 DIAGNOSIS — I69920 Aphasia following unspecified cerebrovascular disease: Secondary | ICD-10-CM

## 2011-10-22 DIAGNOSIS — I428 Other cardiomyopathies: Secondary | ICD-10-CM | POA: Diagnosis present

## 2011-10-22 DIAGNOSIS — J189 Pneumonia, unspecified organism: Secondary | ICD-10-CM | POA: Diagnosis present

## 2011-10-22 DIAGNOSIS — Z91199 Patient's noncompliance with other medical treatment and regimen due to unspecified reason: Secondary | ICD-10-CM

## 2011-10-22 DIAGNOSIS — Z01810 Encounter for preprocedural cardiovascular examination: Secondary | ICD-10-CM

## 2011-10-22 DIAGNOSIS — R0902 Hypoxemia: Secondary | ICD-10-CM

## 2011-10-22 DIAGNOSIS — J154 Pneumonia due to other streptococci: Secondary | ICD-10-CM | POA: Diagnosis present

## 2011-10-22 DIAGNOSIS — B9561 Methicillin susceptible Staphylococcus aureus infection as the cause of diseases classified elsewhere: Secondary | ICD-10-CM | POA: Diagnosis present

## 2011-10-22 DIAGNOSIS — J9600 Acute respiratory failure, unspecified whether with hypoxia or hypercapnia: Secondary | ICD-10-CM | POA: Diagnosis not present

## 2011-10-22 DIAGNOSIS — J9601 Acute respiratory failure with hypoxia: Secondary | ICD-10-CM

## 2011-10-22 DIAGNOSIS — A419 Sepsis, unspecified organism: Principal | ICD-10-CM | POA: Diagnosis present

## 2011-10-22 DIAGNOSIS — D72819 Decreased white blood cell count, unspecified: Secondary | ICD-10-CM

## 2011-10-22 DIAGNOSIS — I509 Heart failure, unspecified: Secondary | ICD-10-CM | POA: Diagnosis present

## 2011-10-22 DIAGNOSIS — D703 Neutropenia due to infection: Secondary | ICD-10-CM | POA: Diagnosis present

## 2011-10-22 DIAGNOSIS — R6510 Systemic inflammatory response syndrome (SIRS) of non-infectious origin without acute organ dysfunction: Secondary | ICD-10-CM | POA: Diagnosis present

## 2011-10-22 DIAGNOSIS — G9340 Encephalopathy, unspecified: Secondary | ICD-10-CM | POA: Diagnosis present

## 2011-10-22 DIAGNOSIS — I96 Gangrene, not elsewhere classified: Secondary | ICD-10-CM

## 2011-10-22 DIAGNOSIS — S78119A Complete traumatic amputation at level between unspecified hip and knee, initial encounter: Secondary | ICD-10-CM

## 2011-10-22 DIAGNOSIS — R509 Fever, unspecified: Secondary | ICD-10-CM

## 2011-10-22 DIAGNOSIS — E860 Dehydration: Secondary | ICD-10-CM

## 2011-10-22 DIAGNOSIS — Z48812 Encounter for surgical aftercare following surgery on the circulatory system: Secondary | ICD-10-CM

## 2011-10-22 DIAGNOSIS — F1190 Opioid use, unspecified, uncomplicated: Secondary | ICD-10-CM

## 2011-10-22 DIAGNOSIS — E119 Type 2 diabetes mellitus without complications: Secondary | ICD-10-CM | POA: Diagnosis present

## 2011-10-22 DIAGNOSIS — R7881 Bacteremia: Secondary | ICD-10-CM

## 2011-10-22 DIAGNOSIS — I5042 Chronic combined systolic (congestive) and diastolic (congestive) heart failure: Secondary | ICD-10-CM | POA: Diagnosis present

## 2011-10-22 LAB — COMPREHENSIVE METABOLIC PANEL
AST: 15 U/L (ref 0–37)
Albumin: 3.2 g/dL — ABNORMAL LOW (ref 3.5–5.2)
BUN: 20 mg/dL (ref 6–23)
Calcium: 9.6 mg/dL (ref 8.4–10.5)
Creatinine, Ser: 0.98 mg/dL (ref 0.50–1.35)

## 2011-10-22 LAB — POCT I-STAT 3, ART BLOOD GAS (G3+)
Bicarbonate: 25.6 mEq/L — ABNORMAL HIGH (ref 20.0–24.0)
pCO2 arterial: 44.9 mmHg (ref 35.0–45.0)
pO2, Arterial: 71 mmHg — ABNORMAL LOW (ref 80.0–100.0)

## 2011-10-22 LAB — POCT I-STAT, CHEM 8
BUN: 21 mg/dL (ref 6–23)
Calcium, Ion: 1.26 mmol/L — ABNORMAL HIGH (ref 1.12–1.23)
Chloride: 107 mEq/L (ref 96–112)
HCT: 41 % (ref 39.0–52.0)
Potassium: 4.2 mEq/L (ref 3.5–5.1)
Sodium: 142 mEq/L (ref 135–145)

## 2011-10-22 LAB — CBC WITH DIFFERENTIAL/PLATELET
Basophils Absolute: 0 10*3/uL (ref 0.0–0.1)
Basophils Relative: 0 % (ref 0–1)
Eosinophils Absolute: 0.1 10*3/uL (ref 0.0–0.7)
Eosinophils Relative: 3 % (ref 0–5)
HCT: 39.9 % (ref 39.0–52.0)
MCH: 26.8 pg (ref 26.0–34.0)
MCHC: 32.8 g/dL (ref 30.0–36.0)
MCV: 81.8 fL (ref 78.0–100.0)
Monocytes Absolute: 0.2 10*3/uL (ref 0.1–1.0)
Monocytes Relative: 8 % (ref 3–12)
RDW: 14.7 % (ref 11.5–15.5)

## 2011-10-22 LAB — URINALYSIS, ROUTINE W REFLEX MICROSCOPIC
Bilirubin Urine: NEGATIVE
Hgb urine dipstick: NEGATIVE
Nitrite: NEGATIVE
Protein, ur: NEGATIVE mg/dL
Specific Gravity, Urine: 1.007 (ref 1.005–1.030)
Urobilinogen, UA: 0.2 mg/dL (ref 0.0–1.0)

## 2011-10-22 LAB — PROTIME-INR
INR: 1 (ref 0.00–1.49)
Prothrombin Time: 13.4 seconds (ref 11.6–15.2)

## 2011-10-22 LAB — POCT I-STAT TROPONIN I

## 2011-10-22 LAB — LACTIC ACID, PLASMA: Lactic Acid, Venous: 1.5 mmol/L (ref 0.5–2.2)

## 2011-10-22 LAB — PRO B NATRIURETIC PEPTIDE: Pro B Natriuretic peptide (BNP): 69.3 pg/mL (ref 0–125)

## 2011-10-22 MED ORDER — NALOXONE HCL 0.4 MG/ML IJ SOLN
INTRAMUSCULAR | Status: AC
  Administered 2011-10-22: 0.4 mg
  Filled 2011-10-22: qty 1

## 2011-10-22 MED ORDER — NALOXONE HCL 0.4 MG/ML IJ SOLN
INTRAMUSCULAR | Status: AC
  Administered 2011-10-22: 0.4 mg
  Filled 2011-10-22: qty 4

## 2011-10-22 NOTE — ED Notes (Signed)
Lab at bedside

## 2011-10-22 NOTE — ED Notes (Signed)
Patient found unresponsive at facility, patient last seen normal at approx 1700.  Patient arriving with assisted respirations via BVM , nasal airway in place, +gag reflex, FSBS per EMS 182

## 2011-10-22 NOTE — ED Notes (Signed)
Warming blanket applied at this time, patient rectal temp. 96.4

## 2011-10-22 NOTE — ED Notes (Signed)
Pt responds to verbal stimuli.  Pt stated he felt warm.  Bair hugger removed.

## 2011-10-22 NOTE — ED Notes (Signed)
RT performing ABG at this time.

## 2011-10-22 NOTE — ED Provider Notes (Signed)
History     CSN: [id number]  Arrival date & time 10/22/11  2040   First MD Initiated Contact with Patient 10/22/11 2049        Chief Complaint  Patient presents with  . Loss of Consciousness   Patient is a 56 year old male who presents to emergency department via EMS due to altered mental status. EMS reports they were called for unresponsiveness. Upon their arrival they noticed the patient was nonresponsive and required bag-valve-mask for assistance with respirations. He was then brought to the emergency department for further evaluation. Family members state that at 5 PM patient was in his normal state of health. However, when family returned at 8 PM they noted that patient was unresponsive.  Patient is unable to provide history, and family members state of patient's had no complaints prior to event. Upon arrival in the emergency department, patient was noted to have a GCS of 3, however after receiving Narcan patient slowly became more responsive.   (Consider location/radiation/quality/duration/timing/severity/associated sxs/prior treatment) Patient is a 56 y.o. male presenting with altered mental status.  Altered Mental Status This is a new problem. The current episode started today. The problem occurs constantly. The problem has been unchanged. Nothing aggravates the symptoms. He has tried nothing for the symptoms. The treatment provided no relief.    Past Medical History  Diagnosis Date  . Cardiomyopathy     EF 20-25% by echo 01/2010 with severe LVH felt secondary to substance abuse (no ischemic workup)  . 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 Diabetes mellitus   . HTN (hypertension)   . Polysubstance abuse     Reported hx of cocaine, THC, heroin, EtOH abuse  . Noncompliance   . Acute renal failure     Past Surgical History  Procedure Date  . Total hip arthroplasty   . Amputation 08/15/2011    Procedure:  AMPUTATION ABOVE KNEE;  Surgeon: Sherren Kerns, MD;  Location: [facility] OR;  Service: Vascular;  Laterality: Left;  Amputation End:  0855    Family History  Problem Relation Age of Onset  . Stroke Brother     History  Substance Use Topics  . Smoking status: Current Every Day Smoker -- 0.5 packs/day for 15 years    Types: Cigarettes  . Smokeless tobacco: Current User   Comment: declined  . Alcohol Use: 1.0 oz/week    2 drink(s) per week      Review of Systems  Unable to perform ROS Psychiatric/Behavioral: Positive for altered mental status.    Allergies  Almond oil  Home Medications   Current Outpatient Rx  Name Route Sig Dispense Refill  . AMLODIPINE BESYLATE 10 MG PO TABS Oral Take 1 tablet (10 mg total) by mouth daily.    . ASPIRIN 325 MG PO TABS Oral Take 1 tablet (325 mg total) by mouth daily.    Marland Kitchen CARVEDILOL 25 MG PO TABS Oral Take 25 mg by mouth 2 (two) times daily with a meal.    . FOLIC ACID 1 MG PO TABS Oral Take 1 mg by mouth daily.      . INSULIN ASPART 100 UNIT/ML [place] SOLN  0-9 Units, Subcutaneous, 3 times daily with meals,  CBG < 70: implement hypoglycemia protocol CBG 70 - 120: 0 units CBG 121 - 150: 1 unit CBG 151 - 200: 2 units CBG 201 - 250: 3 units CBG 251 - 300:  5 units CBG 301 - 350: 7 units CBG 351 - 400: 9 units CBG > 400: call MD.    . INSULIN GLARGINE 100 UNIT/ML [place] SOLN Subcutaneous Inject 5 Units into the skin at bedtime. 10 mL 0  . NICOTINE 21 MG/24HR TD PT24 Transdermal Place 1 patch onto the skin daily.    Marland Kitchen PRAVASTATIN SODIUM 40 MG PO TABS Oral Take 1 tablet (40 mg total) by mouth every evening. 30 tablet 12    BP 173/93  Resp 15  SpO2 98%  Physical Exam  Nursing note and vitals reviewed. Constitutional: He is oriented to person, place, and time. He appears well-developed and well-nourished.  HENT:  Head: Normocephalic and atraumatic.  Right Ear: External ear normal.  Left Ear: External ear normal.  Nose: Nose normal.    Mouth/Throat: Oropharynx is clear and moist.  Eyes: Conjunctivae normal and EOM are normal. Pupils are equal, round, and reactive to light.  Neck: Normal range of motion. Neck supple.  Cardiovascular: Normal rate, regular rhythm, normal heart sounds and intact distal pulses.  Exam reveals no gallop and no friction rub.   No murmur heard. Pulmonary/Chest:       Upon arrival, BVM was being used for assistance with respirations.  Patient noted to be spontaneously breathing and thus BVM was switched to NRB.  100% and RR of 15.  Abdominal: Soft. Bowel sounds are normal. He exhibits no distension and no mass. There is no rebound and no guarding.  Musculoskeletal: Normal range of motion. He exhibits no edema and no tenderness.       Left AKA present  Neurological: He is alert and oriented to person, place, and time. He has normal reflexes.  Skin: Skin is warm and dry.  Psychiatric:       Unable to assess    ED Course  Procedures (including critical care time)  Labs Reviewed  CBC WITH DIFFERENTIAL - Abnormal; Notable for the following:    WBC 2.7 (*)     All other components within normal limits  COMPREHENSIVE METABOLIC PANEL - Abnormal; Notable for the following:    Glucose, Bld 140 (*)     Albumin 3.2 (*)     Total Bilirubin 0.1 (*)     All other components within normal limits  POCT I-STAT 3, BLOOD GAS (G3+) - Abnormal; Notable for the following:    pO2, Arterial 71.0 (*)     Bicarbonate 25.6 (*)     All other components within normal limits  POCT I-STAT, CHEM 8 - Abnormal; Notable for the following:    Glucose, Bld 132 (*)     Calcium, Ion 1.26 (*)     All other components within normal limits  URINE RAPID DRUG SCREEN (HOSP PERFORMED) - Abnormal; Notable for the following:    Opiates POSITIVE (*)     All other components within normal limits  PRO B NATRIURETIC PEPTIDE  LACTIC ACID, PLASMA  PROTIME-INR  URINALYSIS, ROUTINE W REFLEX MICROSCOPIC  POCT I-STAT TROPONIN I  BLOOD  GAS, ARTERIAL  CULTURE, BLOOD (ROUTINE X 2)  CULTURE, BLOOD (ROUTINE X 2)  URINE CULTURE   Dg Chest Port 1 View  10/22/2011  *RADIOLOGY REPORT*  Clinical Data: 56 year old male altered mental status.  PORTABLE CHEST - 1 VIEW  Comparison: 08/14/2011 and earlier.  Findings: Semi upright AP portable view 2110 hours.  Chronic cardiomegaly. Other mediastinal contours are within normal limits. Visualized tracheal air column is within normal limits. Chronically low lung volumes.  No pneumothorax, pulmonary edema, or pleural effusion.  No acute pulmonary opacity.  IMPRESSION: No acute cardiopulmonary abnormality.   Original Report Authenticated By: Harley Hallmark, M.D.     Date: 10/23/2011  Rate: 63  Rhythm: normal sinus rhythm  QRS Axis: normal  Intervals: normal  ST/T Wave abnormalities: nonspecific T wave changes  Conduction Disutrbances: incomplete left bundle branch block  Narrative Interpretation:   Old EKG Reviewed: changes noted    1. Hypoxia   2. Altered mental state   3. Leukopenia   4. Narcotic drug use   5. Fever       MDM    Patient is a 56 year old male who presents with altered mental status. Upon arrival in the emergency department patient's temperature noted to be 96.4. Other vital signs remarkable for patient requiring nonrebreather to maintain oxygen saturations above 90%.  Heart rate noted to be in the 50s and blood pressure of 170s over 90s. On exam originally patient with GCS of 3, however after given Narcan patient became more responsive and ultimately had a GCS of 15.  Rest of physical exam as above and relevant for left AKA with dressing in place.  Due to inability to obtain adequate history as to the events surrounding patient's presentation it was felt that possible infectious versus metabolic workup needed be completed.  Review of patient's labs remarkable for white blood cell count 2.7 and a UDS showing positive opiates.  Chest x-ray was read as no acute  cardiopulmonary abnormality.  Attempt to decrease the amount of supplemental oxygen requirement patient was completed, immediately after removing arboretum mask patient with desaturations into the 60s. With hypoxia and AMS, it was felt that a CT PE study and head CT was warranted.  Tolerating PE study repeat temperature revealed patient was febrile to 100.9. Thus blood cultures drawn and given vancomycin and Zosyn. While awaiting results of imaging, critical care consulted and awaiting placement on their service or to step down unit.           Johnney Ou, MD 10/23/11 [phone]

## 2011-10-22 NOTE — Progress Notes (Signed)
Paged to ED for patient's family. Patient's oldest brother Alinda Money. He serves as the Orthoptist at [employer]. His wife Pattricia Boss also works at [employer]. Alinda Money said more family will be arriving. Will continue to support family. Presence; listening; prayer.

## 2011-10-23 ENCOUNTER — Emergency Department (HOSPITAL_COMMUNITY): Payer: Medicare Other

## 2011-10-23 ENCOUNTER — Encounter (HOSPITAL_COMMUNITY): Payer: Self-pay

## 2011-10-23 DIAGNOSIS — R6510 Systemic inflammatory response syndrome (SIRS) of non-infectious origin without acute organ dysfunction: Secondary | ICD-10-CM | POA: Diagnosis present

## 2011-10-23 DIAGNOSIS — J9601 Acute respiratory failure with hypoxia: Secondary | ICD-10-CM | POA: Diagnosis present

## 2011-10-23 DIAGNOSIS — R4701 Aphasia: Secondary | ICD-10-CM

## 2011-10-23 DIAGNOSIS — R0902 Hypoxemia: Secondary | ICD-10-CM

## 2011-10-23 DIAGNOSIS — E860 Dehydration: Secondary | ICD-10-CM | POA: Diagnosis present

## 2011-10-23 DIAGNOSIS — J154 Pneumonia due to other streptococci: Secondary | ICD-10-CM | POA: Diagnosis present

## 2011-10-23 DIAGNOSIS — D703 Neutropenia due to infection: Secondary | ICD-10-CM | POA: Diagnosis present

## 2011-10-23 DIAGNOSIS — J189 Pneumonia, unspecified organism: Secondary | ICD-10-CM | POA: Diagnosis present

## 2011-10-23 DIAGNOSIS — F1310 Sedative, hypnotic or anxiolytic abuse, uncomplicated: Secondary | ICD-10-CM

## 2011-10-23 DIAGNOSIS — R4182 Altered mental status, unspecified: Secondary | ICD-10-CM

## 2011-10-23 DIAGNOSIS — R6889 Other general symptoms and signs: Secondary | ICD-10-CM

## 2011-10-23 DIAGNOSIS — Z48812 Encounter for surgical aftercare following surgery on the circulatory system: Secondary | ICD-10-CM

## 2011-10-23 LAB — GLUCOSE, CAPILLARY
Glucose-Capillary: 109 mg/dL — ABNORMAL HIGH (ref 70–99)
Glucose-Capillary: 114 mg/dL — ABNORMAL HIGH (ref 70–99)
Glucose-Capillary: 108 mg/dL — ABNORMAL HIGH (ref 70–99)
Glucose-Capillary: 76 mg/dL (ref 70–99)

## 2011-10-23 LAB — BASIC METABOLIC PANEL
BUN: 24 mg/dL — ABNORMAL HIGH (ref 6–23)
Calcium: 9.4 mg/dL (ref 8.4–10.5)
GFR calc non Af Amer: 76 mL/min — ABNORMAL LOW (ref >90)
Glucose, Bld: 108 mg/dL — ABNORMAL HIGH (ref 70–99)
Sodium: 139 mEq/L (ref 135–145)

## 2011-10-23 LAB — CBC
MCH: 26.8 pg (ref 26.0–34.0)
MCHC: 32.4 g/dL (ref 30.0–36.0)
Platelets: 262 10*3/uL (ref 150–400)
RDW: 14.8 % (ref 11.5–15.5)

## 2011-10-23 LAB — RAPID URINE DRUG SCREEN, HOSP PERFORMED
Amphetamines: NOT DETECTED
Benzodiazepines: NOT DETECTED
Cocaine: NOT DETECTED
Opiates: POSITIVE — AB
Tetrahydrocannabinol: NOT DETECTED

## 2011-10-23 LAB — HIV ANTIBODY (ROUTINE TESTING W REFLEX): HIV: NONREACTIVE

## 2011-10-23 LAB — PROCALCITONIN: Procalcitonin: 28.17 ng/mL

## 2011-10-23 MED ORDER — VANCOMYCIN HCL 500 MG IV SOLR
500.0000 mg | Freq: Two times a day (BID) | INTRAVENOUS | Status: DC
  Filled 2011-10-23: qty 500

## 2011-10-23 MED ORDER — MORPHINE SULFATE 2 MG/ML IJ SOLN
1.0000 mg | INTRAMUSCULAR | Status: DC | PRN
  Administered 2011-10-25: 1 mg via INTRAVENOUS
  Filled 2011-10-23: qty 1

## 2011-10-23 MED ORDER — INSULIN ASPART 100 UNIT/ML ~~LOC~~ SOLN: 0.0000 [IU] | Freq: Every day | SUBCUTANEOUS | Status: DC

## 2011-10-23 MED ORDER — SODIUM CHLORIDE 0.9 % IV SOLN: 250.0000 mL | INTRAVENOUS | Status: DC | PRN

## 2011-10-23 MED ORDER — IOHEXOL 350 MG/ML SOLN
80.0000 mL | Freq: Once | INTRAVENOUS | Status: AC | PRN
  Administered 2011-10-23: 80 mL via INTRAVENOUS

## 2011-10-23 MED ORDER — SODIUM CHLORIDE 0.9 % IJ SOLN: 3.0000 mL | INTRAMUSCULAR | Status: DC | PRN

## 2011-10-23 MED ORDER — ENOXAPARIN SODIUM 40 MG/0.4ML ~~LOC~~ SOLN
40.0000 mg | SUBCUTANEOUS | Status: DC
  Administered 2011-10-24 – 2011-10-29 (×6): 40 mg via SUBCUTANEOUS
  Filled 2011-10-23 (×6): qty 0.4

## 2011-10-23 MED ORDER — INSULIN ASPART 100 UNIT/ML ~~LOC~~ SOLN
0.0000 [IU] | Freq: Three times a day (TID) | SUBCUTANEOUS | Status: DC
  Administered 2011-10-26 – 2011-10-28 (×2): 1 [IU] via SUBCUTANEOUS

## 2011-10-23 MED ORDER — LEVOFLOXACIN IN D5W 750 MG/150ML IV SOLN
750.0000 mg | INTRAVENOUS | Status: DC
  Administered 2011-10-24 – 2011-10-25 (×2): 750 mg via INTRAVENOUS
  Filled 2011-10-23 (×2): qty 150

## 2011-10-23 MED ORDER — FOLIC ACID 1 MG PO TABS
1.0000 mg | ORAL_TABLET | Freq: Every day | ORAL | Status: DC
  Administered 2011-10-23 – 2011-10-29 (×7): 1 mg via ORAL
  Filled 2011-10-23 (×7): qty 1

## 2011-10-23 MED ORDER — PNEUMOCOCCAL VAC POLYVALENT 25 MCG/0.5ML IJ INJ
0.5000 mL | INJECTION | INTRAMUSCULAR | Status: AC
  Administered 2011-10-24: 0.5 mL via INTRAMUSCULAR
  Filled 2011-10-23: qty 0.5

## 2011-10-23 MED ORDER — HYDROCODONE-ACETAMINOPHEN 5-325 MG PO TABS
1.0000 | ORAL_TABLET | ORAL | Status: DC | PRN
  Administered 2011-10-26 – 2011-10-27 (×2): 1 via ORAL
  Filled 2011-10-23 (×2): qty 1

## 2011-10-23 MED ORDER — NICOTINE 21 MG/24HR TD PT24
21.0000 mg | MEDICATED_PATCH | TRANSDERMAL | Status: DC
  Administered 2011-10-23 – 2011-10-29 (×6): 21 mg via TRANSDERMAL
  Filled 2011-10-23 (×9): qty 1

## 2011-10-23 MED ORDER — INSULIN GLARGINE 100 UNIT/ML ~~LOC~~ SOLN
5.0000 [IU] | Freq: Every day | SUBCUTANEOUS | Status: DC
  Administered 2011-10-23 – 2011-10-28 (×5): 5 [IU] via SUBCUTANEOUS

## 2011-10-23 MED ORDER — DEXTROSE 5 % IV SOLN
1.0000 g | Freq: Three times a day (TID) | INTRAVENOUS | Status: DC
  Administered 2011-10-23: 1 g via INTRAVENOUS
  Filled 2011-10-23 (×3): qty 1

## 2011-10-23 MED ORDER — ONDANSETRON HCL 4 MG/2ML IJ SOLN
4.0000 mg | Freq: Four times a day (QID) | INTRAMUSCULAR | Status: DC | PRN
  Administered 2011-10-25: 4 mg via INTRAVENOUS
  Filled 2011-10-23: qty 2

## 2011-10-23 MED ORDER — SIMVASTATIN 10 MG PO TABS
10.0000 mg | ORAL_TABLET | Freq: Every day | ORAL | Status: DC
  Administered 2011-10-23 – 2011-10-28 (×6): 10 mg via ORAL
  Filled 2011-10-23 (×8): qty 1

## 2011-10-23 MED ORDER — ASPIRIN 325 MG PO TABS
325.0000 mg | ORAL_TABLET | Freq: Every day | ORAL | Status: DC
  Administered 2011-10-23 – 2011-10-29 (×7): 325 mg via ORAL
  Filled 2011-10-23 (×7): qty 1

## 2011-10-23 MED ORDER — ACETAMINOPHEN 325 MG PO TABS
650.0000 mg | ORAL_TABLET | ORAL | Status: DC | PRN
  Administered 2011-10-24: 650 mg via ORAL
  Filled 2011-10-23 (×2): qty 2

## 2011-10-23 MED ORDER — PIPERACILLIN-TAZOBACTAM 3.375 G IVPB
3.3750 g | Freq: Three times a day (TID) | INTRAVENOUS | Status: DC
  Administered 2011-10-23 – 2011-10-25 (×6): 3.375 g via INTRAVENOUS
  Filled 2011-10-23 (×9): qty 50

## 2011-10-23 MED ORDER — VANCOMYCIN HCL 500 MG IV SOLR
500.0000 mg | Freq: Once | INTRAVENOUS | Status: AC
  Administered 2011-10-23: 500 mg via INTRAVENOUS
  Filled 2011-10-23: qty 500

## 2011-10-23 MED ORDER — SODIUM CHLORIDE 0.9 % IV SOLN
250.0000 mL | INTRAVENOUS | Status: DC | PRN
  Administered 2011-10-23: 1000 mL via INTRAVENOUS

## 2011-10-23 MED ORDER — SODIUM CHLORIDE 0.9 % IJ SOLN
3.0000 mL | Freq: Two times a day (BID) | INTRAMUSCULAR | Status: DC
  Administered 2011-10-23 – 2011-10-25 (×5): 3 mL via INTRAVENOUS

## 2011-10-23 MED ORDER — VANCOMYCIN HCL 1000 MG IV SOLR
750.0000 mg | Freq: Once | INTRAVENOUS | Status: DC
  Filled 2011-10-23: qty 750

## 2011-10-23 MED ORDER — ENOXAPARIN SODIUM 30 MG/0.3ML ~~LOC~~ SOLN
30.0000 mg | SUBCUTANEOUS | Status: DC
  Administered 2011-10-23: 30 mg via SUBCUTANEOUS
  Filled 2011-10-23: qty 0.3

## 2011-10-23 MED ORDER — INFLUENZA VIRUS VACC SPLIT PF IM SUSP
0.5000 mL | INTRAMUSCULAR | Status: AC
  Administered 2011-10-24: 0.5 mL via INTRAMUSCULAR
  Filled 2011-10-23: qty 0.5

## 2011-10-23 MED ORDER — ONDANSETRON HCL 4 MG PO TABS: 4.0000 mg | ORAL_TABLET | Freq: Four times a day (QID) | ORAL | Status: DC | PRN

## 2011-10-23 MED ORDER — PIPERACILLIN-TAZOBACTAM 3.375 G IVPB 30 MIN
3.3750 g | Freq: Once | INTRAVENOUS | Status: AC
  Administered 2011-10-23: 3.375 g via INTRAVENOUS
  Filled 2011-10-23: qty 50

## 2011-10-23 NOTE — H&P (Signed)
[facility] History and Physical  Nachmen Guterrez ZOX:[id number] DOB: September 23, 1955 DOA: 10/22/2011  Referring physician: ED physician PCP: Dorrene German, MD   Chief Complaint: Altered mental status  HPI:  A this is a 56 year old male with a prior CVA, cardiomyopathy, and recent left AKA in a nursing home who came to the ED with main concern of altered mental status and was markedly hypoxemia on arrival requiring nonrebreather. I am unable to obtain specific details about pre existing concerns due to pt's altered mental status and expressive aphasia and most of this information was obtained from SNF records where pt has been residing. Per records there was no known fever, chills, but productive cough was noted. Unaware of any specific abdominal or urinary concerns, and apparently mental status has been improving since last hospital discharge.    Assessment and Plan:  Principal Problem:  *HCAP (healthcare-associated pneumonia) - appreciate PCCM input - will admit the pt to SDU for now and will start with providing supportive care - antibiotics per protocol to cover HCAP given that pt is from SNF - narrow down the coverage as possible - currently on nonrebreather and will plan on tapering to [place] as pt is tolerating  Active Problems:  Diabetes mellitus - will check A1C - continue home medication regimen    Aphasia - from recent stroke   CVA (cerebral infarction) - continue risk factor control - continue Aspirin and Statin   Chronic systolic CHF (congestive heart failure) - combined systolic and diastolic - last 2 D ECHO done 81/1914 and showed EF 30 with grade I diastolic dysfunction - will continue to monitor daily weights, strict I's and O's   HTN (hypertension) - slightly on hypotensive side upon arrival - will monitor vitals per floor protocol - hold Norvasc and Coreg for now and readjust based on BP control  Code Status: Full Family Communication: Pt at  bedside Disposition Plan: PT evaluation    Review of Systems:  Unable to obtain due to altered mental status    Past Medical History  Diagnosis Date  . Cardiomyopathy     EF 20-25% by echo 01/2010 with severe LVH felt secondary to substance abuse (no ischemic workup)  . 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 Diabetes mellitus   . HTN (hypertension)   . Polysubstance abuse     Reported hx of cocaine, THC, heroin, EtOH abuse  . Noncompliance   . Acute renal failure     Past Surgical History  Procedure Date  . Total hip arthroplasty   . Amputation 08/15/2011    Procedure: AMPUTATION ABOVE KNEE;  Surgeon: Sherren Kerns, MD;  Location: [facility] OR;  Service: Vascular;  Laterality: Left;  Amputation End:  [phone]    Social History:  reports that he has been smoking Cigarettes.  He has a 7.5 pack-year smoking history. He uses smokeless tobacco. He reports that he drinks about one ounce of alcohol per week. He reports that he does not use illicit drugs.  Allergies  Allergen Reactions  . Almond Oil Shortness Of Breath and Swelling    Family History  Problem Relation Age of Onset  . Stroke Brother     Medication Sig  amLODipine (NORVASC) 10 MG tablet Take 1 tablet (10 mg total) by mouth daily.  aspirin 325 MG tablet Take 1 tablet (325 mg total) by mouth daily.  carvedilol (COREG) 25 MG tablet Take 25  mg by mouth 2 (two) times daily with a meal.  folic acid (FOLVITE) 1 MG tablet Take 1 mg by mouth daily.    insulin glargine 100 UNIT/ML injection Inject 5 Units into the skin at bedtime.  nicotine (NICODERM CQ - Place 1 patch onto the skin daily.  pravastatin (PRAVACHOL) 40 MG tablet Take 1 tablet (40 mg total) by mouth every evening.    Physical Exam: Filed Vitals:   10/22/11 2230 10/22/11 2300 10/22/11 2330 10/23/11 0400  BP: 95/71 119/82 137/81 119/65  Pulse: 67 63 56 95  Temp: 97.3 F (36.3 C) 97.5 F (36.4  C) 97.9 F (36.6 C)   TempSrc:      Resp: 14 27 20    SpO2: 100% 100% 100% 100%    Physical Exam  Constitutional: Appears well-developed and well-nourished. No distress. Chronically ill appearing  HENT: Normocephalic. External right and left ear normal. Oropharynx is clear and moist.  Eyes: Conjunctivae and EOM are normal. PERRLA, no scleral icterus.  Neck: Normal ROM. Neck supple. No JVD. No tracheal deviation. No thyromegaly.  CVS: RRR, S1/S2 +, no murmurs, no gallops, no carotid bruit.  Pulmonary: Crackles bilaterally but no wheezing, no respiratory distress Abdominal: Soft. BS +,  no distension, tenderness in lower quadrants, no rebound or guarding.  Musculoskeletal: Normal range of motion. No edema and no tenderness. Left AKA Lymphadenopathy: No lymphadenopathy noted, cervical, inguinal. Neuro: Alert. Normal reflexes, expressive aphasia Skin: Skin is warm and dry. No rash noted. Not diaphoretic. No erythema. No pallor.  Psychiatric: Normal mood and affect.   Labs on Admission:  Basic Metabolic Panel:  Lab 10/22/11 1610 10/22/11 2208 10/16/11 0634  NA 142 138 141  K 4.2 4.3 4.5  CL 107 102 106  CO2 -- 24 --  GLUCOSE 132* 140* 83  BUN 21 20 23   CREATININE 1.10 0.98 1.20  CALCIUM -- 9.6 --  MG -- -- --  PHOS -- -- --   Liver Function Tests:  Lab 10/22/11 2208  AST 15  ALT 20  ALKPHOS 79  BILITOT 0.1*  PROT 7.4  ALBUMIN 3.2*   CBC:  Lab 10/22/11 2243 10/22/11 2208 10/16/11 0634  WBC -- 2.7* --  NEUTROABS -- 1.7 --  HGB 13.9 13.1 15.0  HCT 41.0 39.9 44.0  MCV -- 81.8 --  PLT -- 303 --   CBG:  Lab 10/16/11 0833  GLUCAP 84    Radiological Exams on Admission: Ct Head Wo Contrast  10/23/2011  *RADIOLOGY REPORT*  Clinical Data: Altered mental status.  Decreased oxygen saturation. Loss of consciousness.  CT HEAD WITHOUT CONTRAST  Technique:  Contiguous axial images were obtained from the base of the skull through the vertex without contrast.  Comparison:  07/21/2011  Findings: Diffuse cerebral atrophy.  Low attenuation changes in the deep white matter consistent with central atrophy.  Large area of encephalomalacia involving the left temporal parietal region. Smaller area of encephalomalacia in the right posterior parietal region.  These are stable since previous study and are consistent with old infarcts.  No acute sulci effacement.  No mass effect or midline shift.  Mild ventricular dilatation consistent with central atrophy.  No abnormal extra-axial fluid collections.  Basal cisterns are not effaced.  No evidence of acute intracranial hemorrhage.  Retention cysts in the right maxillary antrum and left frontal sinus.  Mastoid air cells are not opacified.  No depressed skull fractures.  IMPRESSION: No acute intracranial abnormalities.  Old left temporoparietal and right parietal infarcts.  Original Report Authenticated By: Marlon Pel, M.D.    Ct Angio Chest Pe W/cm &/or Wo Cm  10/23/2011  *RADIOLOGY REPORT*  Clinical Data: Altered mental status.  Hypoxia.  CT ANGIOGRAPHY CHEST  Technique:  Multidetector CT imaging of the chest using the standard protocol during bolus administration of intravenous contrast. Multiplanar reconstructed images including MIPs were obtained and reviewed to evaluate the vascular anatomy.  Contrast: 80mL OMNIPAQUE IOHEXOL 350 MG/ML SOLN  Comparison: None.  Findings: Technically adequate study with good opacification of the central and segmental pulmonary arteries.  No focal filling defects.  No evidence of significant pulmonary embolus.  Mild cardiac enlargement.  Coronary artery calcification.  Normal caliber thoracic aorta.  No dissection.  No significant lymphadenopathy in the chest.  Esophagus is mostly decompressed. No pleural effusions.  Visualized portions of the upper abdominal organs are grossly unremarkable.  There is consolidation in both lung bases with patchy airspace infiltrates in the remainder the lungs.  Changes  likely to represent bilateral pneumonia. Peribronchial thickening suggesting bronchitis.  Airways appear patent.  No pneumothorax.  Normal alignment of the thoracic spine.  IMPRESSION: No evidence of significant pulmonary embolus.  Bilateral lower lobe consolidation and patchy air space disease throughout consistent with pneumonia.   Original Report Authenticated By: Marlon Pel, M.D.    Dg Chest Port 1 View  10/22/2011  *RADIOLOGY REPORT*  Clinical Data: 56 year old male altered mental status.  PORTABLE CHEST - 1 VIEW  Comparison: 08/14/2011 and earlier.  Findings: Semi upright AP portable view 2110 hours.  Chronic cardiomegaly. Other mediastinal contours are within normal limits. Visualized tracheal air column is within normal limits. Chronically low lung volumes.  No pneumothorax, pulmonary edema, or pleural effusion.  No acute pulmonary opacity.  IMPRESSION: No acute cardiopulmonary abnormality.   Original Report Authenticated By: Harley Hallmark, M.D.    Dg Abd Portable 2v  10/23/2011  *RADIOLOGY REPORT*  Clinical Data: Rigid abdomen.  PORTABLE ABDOMEN - 2 VIEW  Comparison: Pelvis 08/14/2011.  Findings: Residual contrast material in the urinary tract.  Foley catheter in the bladder.  Scattered gas and stool in the colon.  No small or large bowel distension.  No free intra-abdominal air.  No abnormal air fluid levels.  Postoperative and post-traumatic deformities of the left hip.  IMPRESSION: Nonobstructive bowel gas pattern.  No free air.   Original Report Authenticated By: Marlon Pel, M.D.     EKG: Normal sinus rhythm, no ST/T wave changes  Debbora Presto, MD  [facility] Pager [phone]  If 7PM-7AM, please contact night-coverage [email or web address] Password [email or web address] 10/23/2011, 4:02 AM

## 2011-10-23 NOTE — Progress Notes (Signed)
   ANTIBIOTIC CONSULT NOTE - INITIAL  Addendum: asked to add zosyn - will give zosyn 3.375 G IV q8h - given over 4h.  F/u renal function, cultures, clinical progress.  Everleigh Colclasure L. Illene Bolus, PharmD, BCPS Clinical Pharmacist Pager: [phone] Pharmacy: [phone] 10/23/2011 10:14 AM    Pharmacy Consult for vancomycin Indication: pneumonia  Allergies  Allergen Reactions  . Almond Oil Shortness Of Breath and Swelling    Patient Measurements: Weight: 52kg  Vital Signs: Temp: 100.2 F (37.9 C) (09/13 0527) Temp src: Core (Comment) (09/13 0527) BP: 126/84 mmHg (09/13 0527) Pulse Rate: 82  (09/13 0527)  Labs:  Basename 10/23/11 0425 10/22/11 2243 10/22/11 2208  WBC 1.8* -- 2.7*  HGB 13.5 13.9 13.1  PLT 262 -- 303  LABCREA -- -- --  CREATININE 1.07 1.10 0.98    Microbiology: Recent Results (from the past 720 hour(s))  MRSA PCR SCREENING     Status: Abnormal   Collection Time   10/23/11  5:50 AM      Component Value Range Status Comment   MRSA by PCR POSITIVE (*) NEGATIVE Final     Medical History: Past Medical History  Diagnosis Date  . Cardiomyopathy     EF 20-25% by echo 01/2010 with severe LVH felt secondary to substance abuse (no ischemic workup)  . 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 Diabetes mellitus   . HTN (hypertension)   . Polysubstance abuse     Reported hx of cocaine, THC, heroin, EtOH abuse  . Noncompliance   . Acute renal failure     Assessment: 56yo male admitted from SNF due to AMS and hypoxemia, CT negative for PE but positive for PNA, to begin IV ABX.  Goal of Therapy:  Vancomycin trough level 15-20 mcg/ml  Plan:  Will begin vancomycin 500mg  IV Q12H x8d and monitor CBC, Cx, levels prn.

## 2011-10-23 NOTE — Progress Notes (Signed)
[facility] Progress Note  TEAM 1 - Stepdown/ICU TEAM   Jeremy Howell WUJ:[id number] DOB: May 13, 1955 DOA: 10/22/2011 PCP: Dorrene German, MD  Brief narrative: 56 year old male patient with history of CVA. 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 Presented to the ER with reports of altered mentation and found to be markedly hypoxemic on arrival therefore required placement of 100% nonrebreather mask. History was difficult to obtain due to patient's acute altered mentation in the setting of chronic expressive aphasia. Information was obtained from the nursing facility records. No apparent fever chills prior to admission but he was expressing a productive cough. Due to the degree of hypoxemia and the fact the patient is a full code he was admitted to the step down unit for further monitoring and treatment. While in the emergency department he had been evaluated by Dr. Kendrick Fries with critical care medicine who felt the patient was not an imminent need of intubation.  Assessment/Plan: Principal Problem:  *Acute respiratory failure with hypoxia 2/2 HCAP (healthcare-associated pneumonia)/Streptococcal pneumonia *Still requiring oxygen but currently appears very comfortable and saturations are 100% so likely can wean oxygen as tolerated to keep saturations greater than 92% *Urinary antigen positive for streptococcal pneumonia *DC Maxipime, continue Zosyn and Levaquin *Continue supportive care  Active Problems:  Sepsis *Continue IV fluid hydration and treat underlying causes *Currently hemodynamically stable *After hydration patient has had a temperature max in the past 24 hours of 101.1 *Procalcitonin was 28.17 today    Neutropenia associated with infection *Baseline white count before this illness was normal and now is low at 1800 with absolute neutrophils also normal *Repeat CBC with differential in the morning *No indications at this point for  neutropenic precautions   Diabetes mellitus *CBGs are well controlled *Continue Lantus insulin and sliding scale insulin   Chronic combined mod- severe systolic (EF 30-35%) and grade 1 diastolic CHF (congestive heart failure) *Currently appears to be compensated but we'll cautiously rehydrate and observe for signs of heart failure   Dehydration *As noted above and likely secondary to underlying chronic medical problems in setting of acute respiratory illness *IV fluid at 75 cc per hour   PVD (peripheral vascular disease) *Recent AKA on the left *Has undergone outpatient arteriogram that revealed need for revascularization and right lower extremity when patient stable from a renal standpoint *Outpatient cardiac clearance in progress-Myoview stress test pending   History of CVA (cerebrovascular accident)/Aphasia   HTN (hypertension) *Initially blood pressure quite soft after admitted to ICU but is slowly rebounding *Likely will be able to resume Norvasc by 10/24/2011   DVT prophylaxis: Cutaneous Lovenox daily Code Status: Full Family Communication: Spoke with the patient's brother who was at the bedside today Disposition Plan: Remain in step down  Consultants: Pulmonary critical care medicine-sign of  Procedures: None  Antibiotics: Zosyn 9/13 >>> Vancomycin 9/13 >>> 9/13 Maxipime 9/13 >>> 9/13 Levaquin 9/13 >>>  HPI/Subjective: Patient alert but due to his underlying aphasia unable to effectively communicate. Does not appear to be in any distress   Objective: Blood pressure 148/73, pulse 88, temperature 100.6 F (38.1 C), temperature source Core (Comment), resp. rate 22, weight 57.1 kg (125 lb 14.1 oz), SpO2 92.00%.  Intake/Output Summary (Last 24 hours) at 10/23/11 1237 Last data filed at 10/23/11 1000  Gross per 24 hour  Intake     50 ml  Output    775 ml  Net   -725 ml     Exam: Patient was admitted during the past 24 hours  a full physical exam deferred at  this time  Data Reviewed: Basic Metabolic Panel:  Lab 10/23/11 4098 10/22/11 2243 10/22/11 2208  NA 139 142 138  K 4.8 4.2 4.3  CL 106 107 102  CO2 24 -- 24  GLUCOSE 108* 132* 140*  BUN 24* 21 20  CREATININE 1.07 1.10 0.98  CALCIUM 9.4 -- 9.6  MG 1.7 -- --  PHOS 3.8 -- --   Liver Function Tests:  Lab 10/22/11 2208  AST 15  ALT 20  ALKPHOS 79  BILITOT 0.1*  PROT 7.4  ALBUMIN 3.2*   No results found for this basename: LIPASE:5,AMYLASE:5 in the last 168 hours No results found for this basename: AMMONIA:5 in the last 168 hours CBC:  Lab 10/23/11 0425 10/22/11 2243 10/22/11 2208  WBC 1.8* -- 2.7*  NEUTROABS -- -- 1.7  HGB 13.5 13.9 13.1  HCT 41.7 41.0 39.9  MCV 82.9 -- 81.8  PLT 262 -- 303   Cardiac Enzymes: No results found for this basename: CKTOTAL:5,CKMB:5,CKMBINDEX:5,TROPONINI:5 in the last 168 hours BNP (last 3 results)  Basename 10/22/11 2208 02/14/11 0500 02/13/11 0830  PROBNP 69.3 104.1 182.0*   CBG:  Lab 10/23/11 0814 10/23/11 0615  GLUCAP 114* 109*    Recent Results (from the past 240 hour(s))  MRSA PCR SCREENING     Status: Abnormal   Collection Time   10/23/11  5:50 AM      Component Value Range Status Comment   MRSA by PCR POSITIVE (*) NEGATIVE Final      Studies:  Recent x-ray studies have been reviewed in detail by the Attending Physician  Scheduled Meds:  Reviewed in detail by the Attending Physician   Junious Silk, ANP [facility] Office  [phone] Pager [phone]  On-Call/Text Page:      [email or web address]      password [email or web address]  If 7PM-7AM, please contact night-coverage [email or web address] Password [facility] 10/23/2011, 12:37 PM   LOS: 1 day   I have examined the patient, reviewed the chart and modified the above note which I agree with.   Calvert Cantor, MD [phone]

## 2011-10-23 NOTE — ED Notes (Signed)
Patient alert oriented to self , reoriented patient to surroundings.

## 2011-10-23 NOTE — Progress Notes (Signed)
INITIAL ADULT NUTRITION ASSESSMENT Date: 10/23/2011   Time: 3:49 PM Reason for Assessment: MST  INTERVENTION: Supplement diet as appropriate.    ASSESSMENT: Male 56 y.o.  Dx: HCAP (healthcare-associated pneumonia)  Hx:  Past Medical History  Diagnosis Date  . Cardiomyopathy     EF 20-25% by echo 01/2010 with severe LVH felt secondary to substance abuse (no ischemic workup)  . 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 Diabetes mellitus   . HTN (hypertension)   . Polysubstance abuse     Reported hx of cocaine, THC, heroin, EtOH abuse  . Noncompliance   . Acute renal failure    Past Surgical History  Procedure Date  . Total hip arthroplasty   . Amputation 08/15/2011    Procedure: AMPUTATION ABOVE KNEE;  Surgeon: Sherren Kerns, MD;  Location: [facility] OR;  Service: Vascular;  Laterality: Left;  Amputation End:  1191   Related Meds:     . aspirin  325 mg Oral Daily  . enoxaparin (LOVENOX) injection  40 mg Subcutaneous Q24H  . folic acid  1 mg Oral Daily  . influenza  inactive virus vaccine  0.5 mL Intramuscular Tomorrow-1000  . insulin aspart  0-5 Units Subcutaneous QHS  . insulin aspart  0-9 Units Subcutaneous TID WC  . insulin glargine  5 Units Subcutaneous QHS  . levofloxacin (LEVAQUIN) IV  750 mg Intravenous Q24H  . naloxone      . naloxone      . nicotine  21 mg Transdermal Q24H  . piperacillin-tazobactam  3.375 g Intravenous Once  . piperacillin-tazobactam (ZOSYN)  IV  3.375 g Intravenous Q8H  . pneumococcal 23 valent vaccine  0.5 mL Intramuscular Tomorrow-1000  . simvastatin  10 mg Oral q1800  . sodium chloride  3 mL Intravenous Q12H  . vancomycin  500 mg Intravenous Once  . DISCONTD: ceFEPime (MAXIPIME) IV  1 g Intravenous Q8H  . DISCONTD: enoxaparin (LOVENOX) injection  30 mg Subcutaneous Q24H  . DISCONTD: vancomycin  500 mg Intravenous Q12H  . DISCONTD: vancomycin  750 mg Intravenous Once   Ht:  5\' 5"  (165.1 cm)  Wt: 125 lb 14.1 oz (57.1 kg) bed scale  Ideal Wt: 52.5 kg adj for AKA % Ideal Wt: 109%  Usual Wt:  Wt Readings from Last 10 Encounters:  10/23/11 125 lb 14.1 oz (57.1 kg)  10/16/11 114 lb 3.2 oz (51.8 kg)  10/16/11 114 lb 3.2 oz (51.8 kg)  08/19/11 114 lb 3.2 oz (51.8 kg)  08/19/11 114 lb 3.2 oz (51.8 kg)  02/15/11 132 lb 11.5 oz (60.2 kg)   % Usual Wt: -  BMI: 22.1 WNL  Food/Nutrition Related Hx: Pt verbalizes weight loss  Labs:  CMP     Component Value Date/Time   NA 139 10/23/2011 0425   K 4.8 10/23/2011 0425   CL 106 10/23/2011 0425   CO2 24 10/23/2011 0425   GLUCOSE 108* 10/23/2011 0425   BUN 24* 10/23/2011 0425   CREATININE 1.07 10/23/2011 0425   CALCIUM 9.4 10/23/2011 0425   PROT 7.4 10/22/2011 2208   ALBUMIN 3.2* 10/22/2011 2208   AST 15 10/22/2011 2208   ALT 20 10/22/2011 2208   ALKPHOS 79 10/22/2011 2208   BILITOT 0.1* 10/22/2011 2208   GFRNONAA 76* 10/23/2011 0425   GFRAA 88* 10/23/2011 0425   CBG (last 3)   Basename 10/23/11 1252 10/23/11 0814 10/23/11 0615  GLUCAP  128* 114* 109*   Sodium  Date/Time Value Range Status  10/23/2011  4:25 AM 139  135 - 145 mEq/L Final  10/22/2011 10:43 PM 142  135 - 145 mEq/L Final  10/22/2011 10:08 PM 138  135 - 145 mEq/L Final    Potassium  Date/Time Value Range Status  10/23/2011  4:25 AM 4.8  3.5 - 5.1 mEq/L Final  10/22/2011 10:43 PM 4.2  3.5 - 5.1 mEq/L Final  10/22/2011 10:08 PM 4.3  3.5 - 5.1 mEq/L Final    Phosphorus  Date/Time Value Range Status  10/23/2011  4:25 AM 3.8  2.3 - 4.6 mg/dL Final  10/15/452  0:98 AM 3.2  2.3 - 4.6 mg/dL Final  02/09/9145  8:29 AM 3.4  2.3 - 4.6 mg/dL Final    Magnesium  Date/Time Value Range Status  10/23/2011  4:25 AM 1.7  1.5 - 2.5 mg/dL Final  06/14/2128  8:65 AM 1.8  1.5 - 2.5 mg/dL Final  08/17/4694  2:95 AM 2.2  1.5 - 2.5 mg/dL Final    Intake/Output Summary (Last 24 hours) at 10/23/11 1549 Last data filed at 10/23/11 1300  Gross per 24 hour  Intake  377.5 ml    Output    895 ml  Net -517.5 ml   Diet Order:   NPO   Supplements/Tube Feeding: none   IVF:   NA  Pt admitted from SNF after recent L AKA (08/2011) for HCAP. Pt requiring nonrebreather mask on admission. Per MD doing better and will likely not need to be intubated. Pt with hx of CVA and chronic expressive aphasia.  This RD used tape measure to document height. Per SLP she currently recommends NPO for today but feels that pt will be ready to start po diet tomorrow. Pt unable to give many details due to his hx of expressive aphasia but does acknowledge weight loss PTA. Question accuracy of bed scale weight.  Suspect some level of malnutrition. Pt does have temporal wasting. Pt had documented weight loss last admission.    Estimated Nutritional Needs:   Kcal:  1800-2000 Protein:  90-100 Fluid:  >1.8 L/day  NUTRITION DIAGNOSIS: -Inadequate oral intake (NI-2.1).  Status: Ongoing  RELATED TO: inability to eat  AS EVIDENCE BY: NPO status  MONITORING/EVALUATION(Goals): Goal: Pt to meet >/= 90% of their estimated nutrition needs. Monitor: diet advancement, weight   EDUCATION NEEDS: -No education needs identified at this time   DOCUMENTATION CODES Per approved criteria  -Not Applicable    Kendell Bane RD, LDN, CNSC [phone] Pager [phone] After Hours Pager  10/23/2011, 3:49 PM

## 2011-10-23 NOTE — ED Notes (Signed)
Patient transported to CT 

## 2011-10-23 NOTE — Consult Note (Addendum)
Name: Jeremy Howell MRN: [id number] DOB: [birth year]  LOS: 1  Requesting Physician: Nicanor Alcon EDP Reason for consult: hypoxemic respiratory failure  CRITICAL CARE ADMISSION NOTE  History of Present Illness: A this is a 56 year old male with a prior CVA, cardiomyopathy, and recent AKA in a nursing home who came to the medicine emergency room on 10/22/2011 for altered now says in hypoxemic respiratory failure. In the emergency room he received Narcan and his mental status improved he is found to be markedly hypoxemic recurring a 100% nonrebreather. Chest x-ray was initially found to be negative but a followup CT angiogram chest showed by basilar airspace disease consistent with pneumonia. Pulmonary critical care medicine was consult for further evaluation of his hypoxemic respiratory failure. Upon my arrival to the emergency room he was breathing comfortably and had difficulty communicating consistent with his known aphasia from a prior stroke. Per nursing his mental status has continued to improve since his arrival to the emergency room.  Lines / Drains: Peripheral IV  Cultures / Sepsis markers: 9/12 blood >> 9/12 urine >>  Antibiotics: 9/13 vanc >> 9/13 zosyn >>  Tests / Events: 9/12 CT Head >> NAICP, old left tempo-parietal and R parietal infarcts 9/13 CT chest >> no pulmonary embolism, bibasilar pneumonia     Past Medical History  Diagnosis Date  . Cardiomyopathy     EF 20-25% by echo 01/2010 with severe LVH felt secondary to substance abuse (no ischemic workup)  . 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 Diabetes mellitus   . HTN (hypertension)   . Polysubstance abuse     Reported hx of cocaine, THC, heroin, EtOH abuse  . Noncompliance   . Acute renal failure    Past Surgical History  Procedure Date  . Total hip arthroplasty   . Amputation 08/15/2011    Procedure: AMPUTATION ABOVE KNEE;  Surgeon: Sherren Kerns, MD;  Location[facility] OR;  Service: Vascular;  Laterality: Left;  Amputation End:  [phone]   Prior to Admission medications   Medication Sig Start Date End Date Taking? Authorizing Provider  amLODipine (NORVASC) 10 MG tablet Take 1 tablet (10 mg total) by mouth daily. 08/18/11 08/17/12  Sorin Luanne Bras, MD  aspirin 325 MG tablet Take 1 tablet (325 mg total) by mouth daily. 02/15/11 02/15/12  Calvert Cantor, MD  carvedilol (COREG) 25 MG tablet Take 25 mg by mouth 2 (two) times daily with a meal.    Historical Provider, MD  folic acid (FOLVITE) 1 MG tablet Take 1 mg by mouth daily.      Historical Provider, MD  insulin aspart (NOVOLOG) 100 UNIT/ML injection 0-9 Units, Subcutaneous, 3 times daily with meals,  CBG < 70: implement hypoglycemia protocol CBG 70 - 120: 0 units CBG 121 - 150: 1 unit CBG 151 - 200: 2 units CBG 201 - 250: 3 units CBG 251 - 300: 5 units CBG 301 - 350: 7 units CBG 351 - 400: 9 units CBG > 400: call MD. 08/19/11   Elease Etienne, MD  insulin glargine (LANTUS) 100 UNIT/ML injection Inject 5 Units into the skin at bedtime. 08/18/11 08/17/12  Sorin Luanne Bras, MD  nicotine (NICODERM CQ - DOSED IN MG/24 HOURS) 21 mg/24hr patch Place 1 patch onto the skin daily.    Historical Provider, MD  pravastatin (PRAVACHOL) 40 MG tablet Take 1 tablet (40 mg total) by mouth every evening. 10/21/11 10/20/12  Lewayne Bunting, MD   Allergies  Allergen Reactions  . Almond Oil Shortness Of Breath and Swelling   Family History  Problem Relation Age of Onset  . Stroke Brother    Social History  reports that he has been smoking Cigarettes.  He has a 7.5 pack-year smoking history. He uses smokeless tobacco. He reports that he drinks about one ounce of alcohol per week. He reports that he does not use illicit drugs.  Review Of Systems   Cannot obtain due to aphasia  Vital Signs:   Filed Vitals:   10/22/11 2145 10/22/11 2230 10/22/11 2300 10/22/11 2330  BP: 115/80 95/71 119/82 137/81  Pulse: 74 67 63 56    Temp: 97 F (36.1 C) 97.3 F (36.3 C) 97.5 F (36.4 C) 97.9 F (36.6 C)  TempSrc:      Resp: 24 14 27 20   SpO2: 100% 100% 100% 100%  9/13 FiO2: NRB  Physical Examination: Gen: chronically ill appearing but in no acute distress HEENT: NCAT, PERRL, EOMi, OP clear, NRB mask in place Neck: supple without masses PULM: rhonchi and crackles bilaterally CV: RRR, no mgr, no JVD AB: BS+, firm, ?tender LLQ, exam not consistent, firmness appears to improve after serial exams Ext: a/p L AKA, stump normal in appearance, no edema Derm: no rash or skin breakdown Neuro: awake and alert, expressive aphasia; maew Psyche: Normal mood and affect  Labs and Imaging:   CBC    Component Value Date/Time   WBC 2.7* 10/22/2011 2208   RBC 4.88 10/22/2011 2208   HGB 13.9 10/22/2011 2243   HCT 41.0 10/22/2011 2243   PLT 303 10/22/2011 2208   MCV 81.8 10/22/2011 2208   MCH 26.8 10/22/2011 2208   MCHC 32.8 10/22/2011 2208   RDW 14.7 10/22/2011 2208   LYMPHSABS 0.7 10/22/2011 2208   MONOABS 0.2 10/22/2011 2208   EOSABS 0.1 10/22/2011 2208   BASOSABS 0.0 10/22/2011 2208    BMET    Component Value Date/Time   NA 142 10/22/2011 2243   K 4.2 10/22/2011 2243   CL 107 10/22/2011 2243   CO2 24 10/22/2011 2208   GLUCOSE 132* 10/22/2011 2243   BUN 21 10/22/2011 2243   CREATININE 1.10 10/22/2011 2243   CALCIUM 9.6 10/22/2011 2208   GFRNONAA >90 10/22/2011 2208   GFRAA >90 10/22/2011 2208    ABG    Component Value Date/Time   PHART 7.358 10/22/2011 2106   PCO2ART 44.9 10/22/2011 2106   PO2ART 71.0* 10/22/2011 2106   HCO3 25.6* 10/22/2011 2106   TCO2 25 10/22/2011 2243   ACIDBASEDEF 1.0 10/22/2011 2106   O2SAT 94.0 10/22/2011 2106     Assessment and Plan:  This is a 39 show male who was brought in initially with acute encephalopathy likely secondary to narcotic overdose as he responded to Narcan (although admittedly there is no opiates on his current med list) but was found to have hypoxemic respiratory failure in the  emergency room secondary to healthcare associated pneumonia. He is currently oxygenating well (100% O2 sat) on a 100% nonrebreather and is breathing comfortably. He is receiving appropriate antibiotics. At this point based on his respiratory status it would be reasonable to admit him to step down with pulmonary critical care medicine consulting.  He appears to have a firm abdomen on physical exam but this is out of proportion to his normal vital signs and nontoxic appearance. I reviewed the chest x-ray and chest CT performed tonight and there is no clear  evidence of free air under the diaphragm. Further I took the time to review multiple notes from the [facility] hospitalist team when he was admitted prior and they documented multiple abdominal exams with firmness and voluntary guarding without an obvious abdominal pathology. Based on this I don't think there is a abdominal process going on here but we'll check a KUB to make sure there is no free air.  1) HCAP: Assessment: severe, with hypoxemia but currently oxygenating well and not showing signs of impending respiratory failure Plan: -agree with vanc/zosyn -f/u cultures -HOB > 30 degrees -IS -pulm toilette  2) Acute encephalopathy Assessment: unclear etiology, now resolved s/p narcan; was there occult narcotic use? Plan: -supportive care -frequent orientation  3) Firm abdomen Assessment: consistent with prior exams, no clear evidence of abdominal pathology Plan: -f/u KUB -would hold off on CT abdomen now unless status rapidly deteriororates  Rec admit to [facility] on SDU status.  PCCM consulting  Call if questions.  Heber Zanesfield, M.D. Pulmonary and Critical Care Medicine [facility] Pager: [phone]  10/23/2011, 3:19 AM

## 2011-10-23 NOTE — Progress Notes (Signed)
Clinical Social Work Department BRIEF PSYCHOSOCIAL ASSESSMENT 10/23/2011  Patient:  Jeremy Howell, Jeremy Howell     Account Number:  [id number]     Admit date:  10/22/2011  Clinical Social Worker:  Margaree Mackintosh  Date/Time:  10/23/2011 02:11 PM  Referred by:  RN  Date Referred:  10/23/2011 Referred for  SNF Placement   Other Referral:   Interview type:   Other interview type:    PSYCHOSOCIAL DATA Living Status:  FACILITY Admitted from facility:  [facility] Level of care:  Skilled Nursing Facility Primary support name:  Alinda Money: [phone] & [phone] Primary support relationship to patient:  SIBLING Degree of support available:   Dtr: [facility]: [phone]    CURRENT CONCERNS Current Concerns  Post-Acute Placement   Other Concerns:    SOCIAL WORK ASSESSMENT / PLAN Clinical Social Worker recieved referral indicating pt is from [facility].  CSW reviewed chart and phoned facility to seek contact information.  Facility listed [facility] (ph# [phone] & [phone]) and dtr [facility] [phone]).  CSW spoke with both Alinda Money and Kia who confirmed plan to return to SNF at dc.  CSW to continue to follow and assist as needed.   Assessment/plan status:  Information/Referral to [facility] Other assessment/ plan:   Information/referral to community resources:   SNF    PATIENT'S/FAMILY'S RESPONSE TO PLAN OF CARE: Pt currently unable to participate in assessment due to medical condition.  Family pleasant and engaged in conversation.  Family thanked CSW for intervention.       Angelia Mould, MSW, [place] [phone]

## 2011-10-23 NOTE — Evaluation (Signed)
Clinical/Bedside Swallow Evaluation Patient Details  Name: Jeremy Howell MRN: [id number] Date of Birth: 09-11-1955  Today's Date: 10/23/2011 Time: 1450-1510 SLP Time Calculation (min): 20 min  Past Medical History:  Past Medical History  Diagnosis Date  . Cardiomyopathy     EF 20-25% by echo 01/2010 with severe LVH felt secondary to substance abuse (no ischemic workup)  . 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 Diabetes mellitus   . HTN (hypertension)   . Polysubstance abuse     Reported hx of cocaine, THC, heroin, EtOH abuse  . Noncompliance   . Acute renal failure    Past Surgical History:  Past Surgical History  Procedure Date  . Total hip arthroplasty   . Amputation 08/15/2011    Procedure: AMPUTATION ABOVE KNEE;  Surgeon: Sherren Kerns, MD;  Location: [facility] OR;  Service: Vascular;  Laterality: Left;  Amputation End:  0865   HPI:  56 yr old admitted from SNF for AMS, hypoxemia.  Problem list includes:  SIRS, acute respiratory failure, pna.  History of left CVA with residual aphasia, DM, CHF, HTN, polysubstance abuse, noncompliance, acute renal failure.  CXR reveals bilateral lobe consolidation, pathcy airspace disease consistent with pna.     Assessment / Plan / Recommendation Clinical Impression  Pt. demonstrated mild oral dysphagia at bedside with decreased labial seal on spoon with anterior labial leakage.  Pharyngealy, pt. exhibited a suspected delay in swallow initiation with thin liquid.  No additional s/s aspiration present, however given current pna, possible history of dysphagia with prior CVA, risk for silent aspiration is increased.  SLP recommends continue NPO except for meds with MBS tomorrow.     Aspiration Risk  Moderate    Diet Recommendation NPO except meds   Medication Administration: Whole meds with puree    Other  Recommendations Recommended Consults: MBS Oral Care Recommendations:  Oral care BID   Follow Up Recommendations  Skilled Nursing facility                   Swallow Study Prior Functional Status          Oral/Motor/Sensory Function Overall Oral Motor/Sensory Function: Impaired at baseline Labial Symmetry: Within Functional Limits Facial Symmetry: Within Functional Limits Velum:  (possibly decreased. Difficult to view) Mandible: Within Functional Limits   Ice Chips Ice chips: Impaired Presentation: Spoon Oral Phase Impairments: Reduced labial seal;Reduced lingual movement/coordination Oral Phase Functional Implications:  (anterior spill) Pharyngeal Phase Impairments: Suspected delayed Swallow;Change in Vital Signs;Decreased hyoid-laryngeal movement (decreases O2 sats)   Thin Liquid Thin Liquid: Impaired Presentation: Spoon;Cup;Self Fed Oral Phase Impairments: Reduced lingual movement/coordination Pharyngeal  Phase Impairments: Suspected delayed Swallow;Change in Vital Signs    Nectar Thick Nectar Thick Liquid: Not tested   Honey Thick Honey Thick Liquid: Not tested   Puree Puree: Impaired Presentation: Spoon Pharyngeal Phase Impairments: Change in Vital Signs   Solid       Solid: Not tested       Royce Macadamia M.Ed [employer] [phone]  10/23/2011

## 2011-10-23 NOTE — Progress Notes (Signed)
ANTIBIOTIC CONSULT NOTE - INITIAL  Pharmacy Consult for vancomycin Indication: pneumonia  Allergies  Allergen Reactions  . Almond Oil Shortness Of Breath and Swelling    Patient Measurements: Weight: 52kg  Vital Signs: Temp: 97.9 F (36.6 C) (09/12 2330) Temp src: Rectal (09/12 2133) BP: 119/65 mmHg (09/13 0400) Pulse Rate: 95  (09/13 0400)  Labs:  Basename 10/22/11 2243 10/22/11 2208  WBC -- 2.7*  HGB 13.9 13.1  PLT -- 303  LABCREA -- --  CREATININE 1.10 0.98    Microbiology: No results found for this or any previous visit (from the past 720 hour(s)).  Medical History: Past Medical History  Diagnosis Date  . Cardiomyopathy     EF 20-25% by echo 01/2010 with severe LVH felt secondary to substance abuse (no ischemic workup)  . 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 Diabetes mellitus   . HTN (hypertension)   . Polysubstance abuse     Reported hx of cocaine, THC, heroin, EtOH abuse  . Noncompliance   . Acute renal failure     Assessment: 56yo male admitted from SNF due to AMS and hypoxemia, CT negative for PE but positive for PNA, to begin IV ABX.  Goal of Therapy:  Vancomycin trough level 15-20 mcg/ml  Plan:  Will begin vancomycin 500mg  IV Q12H x8d and monitor CBC, Cx, levels prn.  Colleen Can PharmD BCPS 10/23/2011,4:30 AM

## 2011-10-24 ENCOUNTER — Inpatient Hospital Stay (HOSPITAL_COMMUNITY): Payer: Medicare Other

## 2011-10-24 DIAGNOSIS — J154 Pneumonia due to other streptococci: Secondary | ICD-10-CM

## 2011-10-24 DIAGNOSIS — J9600 Acute respiratory failure, unspecified whether with hypoxia or hypercapnia: Secondary | ICD-10-CM

## 2011-10-24 LAB — HEMOGLOBIN A1C: Mean Plasma Glucose: 120 mg/dL — ABNORMAL HIGH (ref <117)

## 2011-10-24 LAB — GLUCOSE, CAPILLARY
Glucose-Capillary: 81 mg/dL (ref 70–99)
Glucose-Capillary: 99 mg/dL (ref 70–99)
Glucose-Capillary: 84 mg/dL (ref 70–99)

## 2011-10-24 LAB — CBC WITH DIFFERENTIAL/PLATELET
Basophils Absolute: 0 10*3/uL (ref 0.0–0.1)
Basophils Relative: 0 % (ref 0–1)
HCT: 36 % — ABNORMAL LOW (ref 39.0–52.0)
MCHC: 32.8 g/dL (ref 30.0–36.0)
Monocytes Absolute: 0.4 10*3/uL (ref 0.1–1.0)
Neutro Abs: 5.9 10*3/uL (ref 1.7–7.7)
Neutrophils Relative %: 82 % — ABNORMAL HIGH (ref 43–77)
RDW: 15.1 % (ref 11.5–15.5)

## 2011-10-24 LAB — COMPREHENSIVE METABOLIC PANEL
AST: 18 U/L (ref 0–37)
Albumin: 2.5 g/dL — ABNORMAL LOW (ref 3.5–5.2)
Chloride: 105 mEq/L (ref 96–112)
Creatinine, Ser: 0.93 mg/dL (ref 0.50–1.35)
Total Bilirubin: 0.5 mg/dL (ref 0.3–1.2)
Total Protein: 6.4 g/dL (ref 6.0–8.3)

## 2011-10-24 LAB — URINE CULTURE: Culture: NO GROWTH

## 2011-10-24 LAB — CK: Total CK: 425 U/L — ABNORMAL HIGH (ref 7–232)

## 2011-10-24 LAB — PROCALCITONIN: Procalcitonin: 93.34 ng/mL

## 2011-10-24 MED ORDER — DEXTROSE 5 % IV SOLN: 500.0000 mg | INTRAVENOUS | Status: DC

## 2011-10-24 MED ORDER — VANCOMYCIN HCL 1000 MG IV SOLR
750.0000 mg | Freq: Two times a day (BID) | INTRAVENOUS | Status: DC
  Administered 2011-10-24 – 2011-10-25 (×3): 750 mg via INTRAVENOUS
  Filled 2011-10-24 (×4): qty 750

## 2011-10-24 MED ORDER — MUPIROCIN 2 % EX OINT
TOPICAL_OINTMENT | Freq: Two times a day (BID) | CUTANEOUS | Status: DC
  Administered 2011-10-24 – 2011-10-25 (×3): via NASAL
  Filled 2011-10-24 (×2): qty 22

## 2011-10-24 NOTE — Progress Notes (Signed)
Spoke with Dr. Marchelle Gearing regarding pt being positive for strep pna and administration of vaccine. Ok to go ahead with administration of pna vaccine.

## 2011-10-24 NOTE — Progress Notes (Addendum)
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 In the emergency room he received Narcan and his mental status improved he is found to be markedly hypoxemic recurring a 100% nonrebreather. Chest x-ray was initially found to be negative but a followup CT angiogram chest showed by basilar airspace disease consistent with pneumonia. Pulmonary critical care medicine was consult for further evaluation of his hypoxemic respiratory failure. Upon my arrival to the emergency room he was breathing comfortably and had difficulty communicating consistent with his known aphasia from a prior stroke. Per nursing his mental status has continued to improve since his arrival to the emergency room.  Lines / Drains: Peripheral IV  Cultures / Sepsis markers: 9/12 blood >> 9/12 urine >>  Results for orders placed during the hospital encounter of 10/22/11  URINE CULTURE     Status: Normal   Collection Time   10/22/11  9:35 PM      Component Value Range Status Comment   Specimen Description URINE, CATHETERIZED   Final    Special Requests NONE   Final    Culture  Setup Time 10/23/2011 00:14   Final    Colony Count NO GROWTH   Final    Culture NO GROWTH   Final    Report Status 10/24/2011 FINAL   Final   CULTURE, BLOOD (ROUTINE X 2)     Status: Normal (Preliminary result)   Collection Time   10/22/11 10:00 PM      Component Value Range Status Comment   Specimen Description BLOOD RIGHT HAND   Final    Special Requests BOTTLES DRAWN AEROBIC AND ANAEROBIC 10CC   Final    Culture  Setup Time 10/23/2011 04:30   Final    Culture     Final    Value:  GRAM POSITIVE COCCI IN CLUSTERS     13 Note: Gram Stain Report Called to,Read Back By and Verified With: RN BRIAN MACK AT 10:44PM 9 2013 BY THOMI   Report Status PENDING   Incomplete   MRSA PCR SCREENING     Status: Abnormal   Collection Time   10/23/11  5:50 AM      Component Value Range Status Comment   MRSA by PCR POSITIVE (*) NEGATIVE Final    URINE STREP PNA  POSITIVE on 10/23/11  Antibiotics: 9/13 vanc >> 9/13 zosyn >> 9/13 Levaquin >> Anti-infectives     Start     Dose/Rate Route Frequency Ordered Stop   10/23/11 1800   vancomycin (VANCOCIN) 500 mg in sodium chloride 0.9 % 100 mL IVPB  Status:  Discontinued        500 mg 100 mL/hr over 60 Minutes Intravenous Every 12 hours 10/23/11 0431 10/23/11 0854   10/23/11 1030  piperacillin-tazobactam (ZOSYN) IVPB 3.375 g       3.375 g 12.5 mL/hr over 240 Minutes Intravenous 3 times per day 10/23/11 1015     10/23/11 0600   ceFEPIme (MAXIPIME) 1 g in dextrose 5 % 50 mL IVPB  Status:  Discontinued        1 g 100 mL/hr over 30 Minutes Intravenous 3 times per day 10/23/11 0422 10/23/11  1610   10/23/11 0430   levofloxacin (LEVAQUIN) IVPB 750 mg        750 mg 100 mL/hr over 90 Minutes Intravenous Every 24 hours 10/23/11 0422 10/26/11 0429   10/23/11 0430   vancomycin (VANCOCIN) 500 mg in sodium chloride 0.9 % 100 mL IVPB        500 mg 100 mL/hr over 60 Minutes Intravenous  Once 10/23/11 0428 10/23/11 0530   10/23/11 0245   vancomycin (VANCOCIN) 750 mg in sodium chloride 0.9 % 150 mL IVPB  Status:  Discontinued        750 mg 150 mL/hr over 60 Minutes Intravenous  Once 10/23/11 0241 10/23/11 0422   10/23/11 0245  piperacillin-tazobactam (ZOSYN) IVPB 3.375 g       3.375 g 100 mL/hr over 30 Minutes Intravenous  Once 10/23/11 0241 10/23/11 0331            Tests / Events: 9/12 CT Head >> NAICP, old left tempo-parietal and R parietal infarcts 9/13 CT chest >> no pulmonary embolism, bibasilar pneumonia       SUBJECTIVE/OVERNIGHT/INTERVAL HX  IMproved hypoxemia. More alert. Less confused. FEver coming down - 29F    Vital Signs:   Filed Vitals:   10/24/11 0500 10/24/11 0600 10/24/11 0700 10/24/11 0800  BP: 144/85 142/75 139/86 165/87  Pulse: 92 100 103 96  Temp: 99.5 F (37.5 C) 99.5 F (37.5 C) 99.3 F (37.4 C) 99.4 F (37.4 C)  TempSrc:      Resp: 24 21 21 25   Height:      Weight:      SpO2: 93% 92% 96% 93%  9/13 FiO2: NRB  Physical Examination: Gen: chronically ill appearing but in no acute distress HEENT: NCAT, PERRL, EOMi, OP clear, NRB mask in place Neck: supple without masses PULM: rhonchi and crackles bilaterally CV: RRR, no mgr, no JVD AB: BS+, firm, ?tender LLQ, exam not consistent, firmness appears to improve after serial exams Ext: a/p L AKA, stump normal in appearance, no edema but tender Derm: no rash or skin breakdown Neuro: awake and alert, expressive aphasia; maew Psyche: Normal mood and affect  Labs and Imaging:   CBC    Component Value Date/Time   WBC 7.3 10/24/2011 0600   RBC 4.40 10/24/2011 0600   HGB 11.8* 10/24/2011 0600   HCT 36.0* 10/24/2011 0600   PLT 222 10/24/2011 0600   MCV 81.8 10/24/2011 0600   MCH 26.8 10/24/2011 0600   MCHC 32.8 10/24/2011 0600   RDW 15.1 10/24/2011 0600   LYMPHSABS 0.8 10/24/2011 0600   MONOABS 0.4 10/24/2011 0600   EOSABS 0.1 10/24/2011 0600   BASOSABS 0.0 10/24/2011 0600    BMET    Component Value Date/Time   NA 136 10/24/2011 0600   K 3.9 10/24/2011 0600   CL 105 10/24/2011 0600   CO2 20 10/24/2011 0600   GLUCOSE 90 10/24/2011 0600   BUN 20 10/24/2011 0600   CREATININE 0.93 10/24/2011 0600   CALCIUM 9.3 10/24/2011 0600   GFRNONAA >90 10/24/2011 0600   GFRAA >90 10/24/2011 0600    ABG    Component Value Date/Time   PHART 7.358 10/22/2011 2106   PCO2ART 44.9 10/22/2011 2106   PO2ART 71.0* 10/22/2011 2106   HCO3 25.6* 10/22/2011 2106   TCO2 25 10/22/2011 2243   ACIDBASEDEF 1.0 10/22/2011 2106   O2SAT 94.0 10/22/2011  2106    Lab 10/24/11 0600 10/23/11 0425  PROCALCITON 93.34 28.17    Lab 10/22/11 2208  PROBNP  69.3    No results found for this basename: TROPONINI:5 in the last 168 hours   Assessment and Plan:  1) HCAP and CAP    - He is urine strep positive, MRSA PCR positive with sputum GPC in clusters  - Hypoxemia improved 10/24/11 and Fever improving 10/24/11 but PCT worse and could be lag effect  PLAN  - Continue vanc and zosyn and levaquin  - HOB > 30 degree -IS -pulm toilette - recheck PCT 10/26/11  2) Acute encephalopathy Assessment: unclear etiology, now resolved s/p narcan; was there occult narcotic use?   - on 10/24/11: improving Plan: -supportive care -frequent orientation  3) Firm abdomen Assessment: consistent with prior exams, no clear evidence of abdominal pathology  - on 10/24/11: abd soft but PCT trending up  Plan: -check lactate, lipase and ck given uptrending PCT just to make sure no acute abdomen (low prob anyways)  4) Tender Left AKA stump  - per RN this is tender and warm. MD not so sure  PLAN  - monitor closely esp given rising PCT   Dr. Kalman Shan, M.D., [facility].C.P Pulmonary and Critical Care Medicine Staff Physician [facility] [place] [facility] Pager: [phone], If no answer or between  15:00h - 7:00h: call [phone]  10/24/2011 9:57 AM     D/w DR Butler Denmark

## 2011-10-24 NOTE — Progress Notes (Signed)
Notified Dr. Marchelle Gearing with CK results this evening. No further orders at this time.

## 2011-10-24 NOTE — Procedures (Signed)
Objective Swallowing Evaluation: Modified Barium Swallowing Study  Patient Details  Name: Jeremy Howell MRN: [id number] Date of Birth: 1955/08/30  Today's Date: 10/24/2011 Time: 1345-1415 SLP Time Calculation (min): 30 min  Past Medical History:  Past Medical History  Diagnosis Date  . Cardiomyopathy     EF 20-25% by echo 01/2010 with severe LVH felt secondary to substance abuse (no ischemic workup)  . 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 Diabetes mellitus   . HTN (hypertension)   . Polysubstance abuse     Reported hx of cocaine, THC, heroin, EtOH abuse  . Noncompliance   . Acute renal failure    Past Surgical History:  Past Surgical History  Procedure Date  . Total hip arthroplasty   . Amputation 08/15/2011    Procedure: AMPUTATION ABOVE KNEE;  Surgeon: Sherren Kerns, MD;  Location: [facility] OR;  Service: Vascular;  Laterality: Left;  Amputation End:  7829   HPI:  56 y/o male admitted to ED from SNF for AMS and hypoxemia.  Admission diagnosis: SIRS, Acute respiratory failure, PNA. Patient with history of CVA with residual aphasia, DM, CHF, HTN, polysubstance abuse, and acute renal failure.  CT of chest indicates bilateral lobe consolidation , patchy airspace disease consistent with PNA.     Assessment / Plan / Recommendation Clinical Impression  Dysphagia Diagnosis: Moderate oral phase dysphagia;Mild pharyngeal phase dysphagia;Mild cervical esophageal phase dysphagia Clinical impression: Moderate oral sensory motor dysphagia marked by decreased lingual coordination with noted lingual pumping and piecemeal swallows noted on all PO trials. Decreased bolus formation with trial of mechanical soft.   Mastication affected by mainly by lack of dentition.  Minimal sensory pharyngeal dysphagia marked by delay in initation.  Swallow initiated at valleculae with all PO trials with exception of thin liquid barium which was  initated at pyriforms .  One instance of flash penetration occurred during swallow of thin liquid barium by cup out of multiple trials.  Patient with slight pooling in pyriforms with minimal residue at CP segment s/p swallow of thin liquid by cup/straw.  Brief esophageal sweep indicates slow bolus transit with backflow to cervical esophagus. No radiologist present to confirm.  Patient with wet cough s/p evaluation during transfer to bed from chair.  Recommend to proceed with dysphagia 2 (finely chopped ) and thin liquid by cup/straw with full supervision to ensure safety.  Recommend strict aspiration and reflux precautions as aspiration risk remains after the swallow due to noted residue in pyriforms and CP segment s/p swallow of thin liquid.   ST to follow in acute care setting for diet tolerance.      Treatment Recommendation       Diet Recommendation Dysphagia 2 (Fine chop);Thin liquid   Liquid Administration via: Cup;Straw Medication Administration: Whole meds with liquid Supervision: Patient able to self feed;Full supervision/cueing for compensatory strategies Compensations: Slow rate;Small sips/bites;Multiple dry swallows after each bite/sip Postural Changes and/or Swallow Maneuvers: Seated upright 90 degrees;Upright 30-60 min after meal    Other  Recommendations Oral Care Recommendations: Oral care QID Other Recommendations: Clarify dietary restrictions   Follow Up Recommendations  Skilled Nursing facility    Frequency and Duration min 2x/week  2 weeks       SLP Swallow Goals Patient will consume recommended diet without observed clinical signs of aspiration with: Supervision/safety Patient will utilize recommended strategies during swallow to increase swallowing safety with: Supervision/safety   General  Date of Onset: 10/22/11 HPI: 56 y/o male admitted to ED from SNF for AMS and hypoxemia.  Admission diagnosis: SIRS, Acute respiratory failure, PNA. Patient with history of CVA  with residual aphasia, DM, CHF, HTN, polysubstance abuse, and acute renal failure.  CT of chest indicates bilateral lobe consolidation , patchy airspace disease consistent with PNA. Type of Study: Modified Barium Swallowing Study Reason for Referral: Objectively evaluate swallowing function Diet Prior to this Study: NPO (NPO with exception of medication ) Temperature Spikes Noted: No Respiratory Status: Supplemental O2 delivered via (comment) History of Recent Intubation: No Behavior/Cognition: Alert;Cooperative;Pleasant mood Oral Cavity - Dentition: Missing dentition;Poor condition Oral Motor / Sensory Function: Impaired - see Bedside swallow eval Self-Feeding Abilities: Able to feed self Patient Positioning: Upright in chair Baseline Vocal Quality: Hoarse;Low vocal intensity Volitional Cough: Weak Volitional Swallow: Unable to elicit Pharyngeal Secretions: Not observed secondary MBS    Reason for Referral Objectively evaluate swallowing function   Oral Phase Oral Preparation/Oral Phase Oral Phase: Impaired Oral - Nectar Oral - Nectar Teaspoon: Incomplete tongue to palate contact;Lingual pumping;Reduced posterior propulsion;Weak lingual manipulation;Piecemeal swallowing;Lingual/palatal residue;Delayed oral transit Oral - Nectar Cup: Piecemeal swallowing;Decreased velopharyngeal closure;Incomplete tongue to palate contact;Delayed oral transit;Reduced posterior propulsion;Weak lingual manipulation;Lingual pumping Oral - Thin Oral - Thin Teaspoon: Incomplete tongue to palate contact;Reduced posterior propulsion;Holding of bolus;Weak lingual manipulation;Lingual/palatal residue;Piecemeal swallowing;Decreased velopharyngeal closure;Delayed oral transit;Lingual pumping Oral - Thin Cup: Incomplete tongue to palate contact;Reduced posterior propulsion;Weak lingual manipulation;Lingual/palatal residue;Piecemeal swallowing;Decreased velopharyngeal closure;Delayed oral transit;Lingual pumping Oral -  Thin Straw: Lingual pumping;Lingual/palatal residue Oral - Solids Oral - Puree: Lingual pumping;Reduced posterior propulsion;Incomplete tongue to palate contact;Weak lingual manipulation;Piecemeal swallowing;Delayed oral transit;Lingual/palatal residue;Decreased velopharyngeal closure Oral - Mechanical Soft: Incomplete tongue to palate contact;Reduced posterior propulsion;Lingual pumping;Delayed oral transit;Lingual/palatal residue;Piecemeal swallowing;Decreased velopharyngeal closure Oral - Pill: Delayed oral transit;Lingual/palatal residue;Piecemeal swallowing;Decreased velopharyngeal closure;Weak lingual manipulation;Incomplete tongue to palate contact;Lingual pumping   Pharyngeal Phase Pharyngeal Phase Pharyngeal Phase: Impaired Pharyngeal - Nectar Pharyngeal - Nectar Teaspoon: Delayed swallow initiation;Premature spillage to valleculae;Reduced tongue base retraction Pharyngeal - Nectar Cup: Delayed swallow initiation;Premature spillage to valleculae;Reduced tongue base retraction Pharyngeal - Thin Pharyngeal - Thin Teaspoon: Delayed swallow initiation;Premature spillage to pyriform sinuses;Reduced tongue base retraction Pharyngeal - Thin Cup: Delayed swallow initiation;Premature spillage to pyriform sinuses;Pharyngeal residue - cp segment;Penetration/Aspiration before swallow Penetration/Aspiration details (thin cup): Material enters airway, remains ABOVE vocal cords then ejected out Pharyngeal - Thin Straw: Reduced tongue base retraction;Premature spillage to pyriform sinuses;Pharyngeal residue - cp segment;Pharyngeal residue - pyriform sinuses Pharyngeal - Solids Pharyngeal - Puree: Reduced tongue base retraction;Premature spillage to valleculae;Delayed swallow initiation Pharyngeal - Mechanical Soft: Delayed swallow initiation;Premature spillage to valleculae;Reduced tongue base retraction Pharyngeal - Pill: Reduced tongue base retraction;Delayed swallow initiation;Premature spillage to  valleculae  Cervical Esophageal Phase    GO      Moreen Fowler MS, CCC-SLP [phone] Cervical Esophageal Phase Cervical Esophageal Phase: Impaired Cervical Esophageal Phase - Solids Puree: Reduced cricopharyngeal relaxation;Esophageal backflow into cervical esophagus Pill: Reduced cricopharyngeal relaxation Cervical Esophageal Phase - Comment Cervical Esophageal Comment: Slow bolus transit         [facility] 10/24/2011, 3:40 PM

## 2011-10-24 NOTE — Progress Notes (Signed)
ANTIBIOTIC CONSULT NOTE - INITIAL Pharmacy Consult for vancomycin and zosyn Indication: HCAP  Allergies  Allergen Reactions  . Almond Oil Shortness Of Breath and Swelling    Patient Measurements: Weight: 52kg  Vital Signs: Temp: 99.4 F (37.4 C) (09/14 0800) BP: 165/87 mmHg (09/14 0800) Pulse Rate: 96  (09/14 0800)  Labs:  Basename 10/24/11 0600 10/23/11 0425 10/22/11 2243 10/22/11 2208  WBC 7.3 1.8* -- 2.7*  HGB 11.8* 13.5 13.9 --  PLT 222 262 -- 303  LABCREA -- -- -- --  CREATININE 0.93 1.07 1.10 --    Microbiology: Recent Results (from the past 720 hour(s))  URINE CULTURE     Status: Normal   Collection Time   10/22/11  9:35 PM      Component Value Range Status Comment   Specimen Description URINE, CATHETERIZED   Final    Special Requests NONE   Final    Culture  Setup Time 10/23/2011 00:14   Final    Colony Count NO GROWTH   Final    Culture NO GROWTH   Final    Report Status 10/24/2011 FINAL   Final   CULTURE, BLOOD (ROUTINE X 2)     Status: Normal (Preliminary result)   Collection Time   10/22/11 10:00 PM      Component Value Range Status Comment   Specimen Description BLOOD RIGHT HAND   Final    Special Requests BOTTLES DRAWN AEROBIC AND ANAEROBIC 10CC   Final    Culture  Setup Time 10/23/2011 04:30   Final    Culture     Final    Value: GRAM POSITIVE COCCI IN CLUSTERS     13 Note: Gram Stain Report Called to,Read Back By and Verified With: RN [facility] AT 10:44PM 9 2013 BY THOMI   Report Status PENDING   Incomplete   MRSA PCR SCREENING     Status: Abnormal   Collection Time   10/23/11  5:50 AM      Component Value Range Status Comment   MRSA by PCR POSITIVE (*) NEGATIVE Final     Medical History: Past Medical History  Diagnosis Date  . Cardiomyopathy     EF 20-25% by echo 01/2010 with severe LVH felt secondary to substance abuse (no ischemic workup)  . 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 Diabetes mellitus   . HTN (hypertension)   . Polysubstance abuse     Reported hx of cocaine, THC, heroin, EtOH abuse  . Noncompliance   . Acute renal failure     Assessment: 56 yo male admitted from SNF due to AMS and hypoxemia, CT negative for PE but positive for PNA.  WBC increasing, Tm 101.2, Scr stable, UOP 0.4 ml/kg/hr yesterday but increased to 3.3 ml/kg/hr this am.  New cx results reveal GPC in clusters in the blood, also noted to be spneum urinary antigen positive.  Consulted this am to resume vancomycin dosing- received vancomycin 500 mg IV x 1 9/13 at 0430.   9/13 vanc >>9/13, resumed 9/14>> 9/13 zosyn>> 9/13 levofloxacin>>  9/12: bld x 2: GPC clusters 9/12: urine: neg 9/13: MRSA PCA positive 9/13: strep pneumo Urinary ag positive  Goal of Therapy:  Vancomycin trough level 15-20 mcg/ml; Irradication of infection  Plan:  - Start vancomycin 750 mg IV q12h - Continue Zosyn 3.375G IV q8h to be infused over 4 hours - Follow up SCr,  UOP, cultures, clinical course and adjust as clinically indicated  Sheddrick Lattanzio L. Illene Bolus, PharmD, BCPS Clinical Pharmacist Pager: [phone] Pharmacy: [phone] 10/24/2011 11:16 AM

## 2011-10-25 DIAGNOSIS — B9561 Methicillin susceptible Staphylococcus aureus infection as the cause of diseases classified elsewhere: Secondary | ICD-10-CM | POA: Diagnosis present

## 2011-10-25 DIAGNOSIS — A4901 Methicillin susceptible Staphylococcus aureus infection, unspecified site: Secondary | ICD-10-CM

## 2011-10-25 DIAGNOSIS — R7881 Bacteremia: Secondary | ICD-10-CM | POA: Diagnosis present

## 2011-10-25 DIAGNOSIS — E860 Dehydration: Secondary | ICD-10-CM

## 2011-10-25 LAB — CULTURE, BLOOD (ROUTINE X 2)

## 2011-10-25 LAB — GLUCOSE, CAPILLARY
Glucose-Capillary: 78 mg/dL (ref 70–99)
Glucose-Capillary: 95 mg/dL (ref 70–99)
Glucose-Capillary: 139 mg/dL — ABNORMAL HIGH (ref 70–99)

## 2011-10-25 LAB — BASIC METABOLIC PANEL
BUN: 11 mg/dL (ref 6–23)
Chloride: 103 mEq/L (ref 96–112)
GFR calc Af Amer: >90 mL/min (ref >90)
Glucose, Bld: 90 mg/dL (ref 70–99)
Potassium: 3.4 mEq/L — ABNORMAL LOW (ref 3.5–5.1)
Sodium: 139 mEq/L (ref 135–145)

## 2011-10-25 LAB — CBC
HCT: 34 % — ABNORMAL LOW (ref 39.0–52.0)
Hemoglobin: 11.8 g/dL — ABNORMAL LOW (ref 13.0–17.0)
MCHC: 34.7 g/dL (ref 30.0–36.0)
RBC: 4.29 MIL/uL (ref 4.22–5.81)

## 2011-10-25 MED ORDER — AMLODIPINE BESYLATE 10 MG PO TABS
10.0000 mg | ORAL_TABLET | Freq: Every day | ORAL | Status: DC
  Administered 2011-10-25 – 2011-10-29 (×5): 10 mg via ORAL
  Filled 2011-10-25 (×6): qty 1

## 2011-10-25 MED ORDER — STARCH (THICKENING) PO POWD
6.0000 g | ORAL | Status: DC | PRN
  Filled 2011-10-25: qty 227

## 2011-10-25 MED ORDER — CEFAZOLIN SODIUM-DEXTROSE 2-3 GM-% IV SOLR
2.0000 g | Freq: Three times a day (TID) | INTRAVENOUS | Status: DC
  Administered 2011-10-25 – 2011-10-29 (×11): 2 g via INTRAVENOUS
  Filled 2011-10-25 (×15): qty 50

## 2011-10-25 MED ORDER — POTASSIUM CHLORIDE CRYS ER 20 MEQ PO TBCR
40.0000 meq | EXTENDED_RELEASE_TABLET | Freq: Once | ORAL | Status: AC
  Administered 2011-10-25: 40 meq via ORAL
  Filled 2011-10-25: qty 2

## 2011-10-25 MED ORDER — DEXTROSE 5 % IV SOLN
500.0000 mg | INTRAVENOUS | Status: DC
  Administered 2011-10-26: 500 mg via INTRAVENOUS
  Filled 2011-10-25: qty 500

## 2011-10-25 MED ORDER — WHITE PETROLATUM GEL
Status: AC
  Administered 2011-10-25: 13:00:00
  Filled 2011-10-25: qty 5

## 2011-10-25 MED ORDER — CARVEDILOL 25 MG PO TABS
25.0000 mg | ORAL_TABLET | Freq: Two times a day (BID) | ORAL | Status: DC
  Administered 2011-10-25 – 2011-10-29 (×8): 25 mg via ORAL
  Filled 2011-10-25 (×11): qty 1

## 2011-10-25 NOTE — Progress Notes (Signed)
Name: Jeremy Howell MRN: [id number] DOB: [birth year]  LOS: 3  Requesting Physician: Nicanor Alcon EDP Reason for consult: hypoxemic respiratory failure  CRITICAL CARE ADMISSION NOTE  History of Present Illness: 56 year old male with a prior CVA, cardiomyopathy ef 35%, and recent AKA in a nursing home who came to the emergency room on 10/22/2011 with AMS and hypoxemic respiratory failure. In the emergency room he received Narcan and his mental status improved he is found to be markedly hypoxemic recurring a 100% nonrebreather. Chest x-ray was initially found to be negative but a followup CT angiogram chest showed by basilar airspace disease consistent with pneumonia. Pulmonary critical care medicine was consult for further evaluation of his hypoxemic respiratory failure.   Lines / Drains: Peripheral IV  Cultures / Sepsis markers: 9/12 blood >>2/2 coag neg staph>>> 9/12 urine >>neg   URINE STREP PNA  POSITIVE on 10/23/11  Antibiotics: 9/13 vanc >> 9/13 zosyn >> 9/13 Levaquin >>   Tests / Events: 9/12 CT Head >> NAICP, old left tempo-parietal and R parietal infarcts 9/13 CT chest >> no pulmonary embolism, bibasilar pneumonia 10/24/11-  Speech evaluation D2 diet     SUBJECTIVE/OVERNIGHT/INTERVAL HX Hypoxia much improved.  Now tol nasal cannula. Fever curve coming down   Vital Signs:   Filed Vitals:   10/25/11 0600 10/25/11 0700 10/25/11 0800 10/25/11 0900  BP: 151/92 143/83 138/85 138/88  Pulse: 94 97 94 96  Temp: 100 F (37.8 C) 100.2 F (37.9 C) 99.7 F (37.6 C) 99.6 F (37.6 C)  TempSrc:      Resp: 22 23 22 21   Height:      Weight:      SpO2: 96% 95% 95% 95%  3L [place]   Physical Examination: Gen: chronically ill appearing but in no acute distress HEENT: NCAT, PERRL, EOMi, OP clear, nasal cannula PULM: resps even non labored, few rhonchi and crackles bilaterally CV: RRR, no mgr, no JVD AB: BS+, soft Ext: a/p L AKA, stump normal in appearance, no edema but mildly  tender Derm: no rash or skin breakdown Neuro: awake and alert, expressive aphasia; maew Psyche: Normal mood and affect  Labs and Imaging:   CBC    Component Value Date/Time   WBC 8.0 10/25/2011 0655   RBC 4.29 10/25/2011 0655   HGB 11.8* 10/25/2011 0655   HCT 34.0* 10/25/2011 0655   PLT 250 10/25/2011 0655   MCV 79.3 10/25/2011 0655   MCH 27.5 10/25/2011 0655   MCHC 34.7 10/25/2011 0655   RDW 14.6 10/25/2011 0655   LYMPHSABS 0.8 10/24/2011 0600   MONOABS 0.4 10/24/2011 0600   EOSABS 0.1 10/24/2011 0600   BASOSABS 0.0 10/24/2011 0600    BMET    Component Value Date/Time   NA 139 10/25/2011 0655   K 3.4* 10/25/2011 0655   CL 103 10/25/2011 0655   CO2 26 10/25/2011 0655   GLUCOSE 90 10/25/2011 0655   BUN 11 10/25/2011 0655   CREATININE 0.89 10/25/2011 0655   CALCIUM 9.6 10/25/2011 0655   GFRNONAA >90 10/25/2011 0655   GFRAA >90 10/25/2011 0655    ABG    Component Value Date/Time   PHART 7.358 10/22/2011 2106   PCO2ART 44.9 10/22/2011 2106   PO2ART 71.0* 10/22/2011 2106   HCO3 25.6* 10/22/2011 2106   TCO2 25 10/22/2011 2243   ACIDBASEDEF 1.0 10/22/2011 2106   O2SAT 94.0 10/22/2011 2106    Lab 10/24/11 0600 10/23/11 0425  PROCALCITON 93.34 28.17    Lab 10/22/11 2208  PROBNP 69.3  No results found for this basename: TROPONINI:5 in the last 168 hours   Assessment and Plan:  1) HCAP and CAP   - Hypoxemia and fever much improved  On 10/25/11 PLAN  - Continue vanc, zosyn and levaquin  - HOB > 30 degree -IS -pulm toilette - recheck PCT 10/26/11 (staff note: there was paradoxical increased in PCT 10/25/11. If increased PCT, cnsider repeat CT chest for empyema)  2) Acute encephalopathy Assessment: unclear etiology, now resolved s/p narcan; was there occult narcotic use? 9/15 : resolved.   Plan: -supportive care -frequent orientation  3) Firm abdomen Assessment: consistent with prior exams, no clear evidence of abdominal pathology 9/15 - abd soft, lipase, lactate ok  PLAN -   Diet as Sydnee Levans, NP 10/25/2011  10:21 AM Pager: [phone] or [phone]   STAFF NOTE: I, Dr Lavinia Sharps have personally reviewed patient's available data, including medical history, events of note, physical examination and test results as part of my evaluation. I have discussed with resident/NP and other care providers such as pharmacist, RN and RRT.  In addition,  I personally evaluated patient and elicited key findings of improving PNA and hypoxemia. D2 diet. Can move to floor. D/w Dr Butler Denmark.  Rest per NP/medical resident whose note is outlined above and that I agree with    Dr. Kalman Shan, M.D., [facility]C.P Pulmonary and Critical Care Medicine Staff Physician [facility] [place] [facility] Pager: [phone], If no answer or between  15:00h - 7:00h: call [phone]  10/25/2011 12:32 PM

## 2011-10-25 NOTE — Progress Notes (Signed)
Received patient from 2108, report received from [facility].  Patient oriented to room, call bell placed within reach, IVF infusing at 10cc/hr Left EJ.  Vitals stable upon arrival to unit.  BP 139/89  Pulse 95  Temp 99.4 F (37.4 C) (Oral)  Resp 20  Ht 5\' 5"  (1.651 m)  Wt 56.1 kg (123 lb 10.9 oz)  BMI 20.58 kg/m2  SpO2 97% Left AKA clean, old incision, patient has no complaints at this time.  Will continue to monitor closely.

## 2011-10-25 NOTE — Progress Notes (Signed)
[facility] Progress Note [place] TEAM 1 - Stepdown/ICU TEAM   Jeremy Howell RUE:[id number] DOB: 02-24-1955 DOA: 10/22/2011 PCP: Dorrene German, MD  Brief narrative: 56 year old male patient with history of CVA. 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 Presented to the ER with reports of altered mentation and found to be markedly hypoxemic on arrival therefore required placement of 100% nonrebreather mask. History was difficult to obtain due to patient's acute altered mentation in the setting of chronic expressive aphasia. Information was obtained from the nursing facility records. No apparent fever chills prior to admission but he was expressing a productive cough. Due to the degree of hypoxemia and the fact the patient is a full code he was admitted to the step down unit for further monitoring and treatment. While in the emergency department he had been evaluated by Dr. Kendrick Fries with critical care medicine who felt the patient was not an imminent need of intubation.  Assessment/Plan: Principal Problem:  *Acute respiratory failure with hypoxia 2/2 HCAP (healthcare-associated pneumonia)/Streptococcal pneumonia *Still requiring oxygen but we have been able to wean down to 2 L - PO2 currently 97% *Urinary antigen positive for streptococcal pneumonia *now on Zithromax and Ancef * appreciate pulmonary f/u  *Continue supportive care  Active Problems:  Sepsis *Continue to treat underlying causes *Currently hemodynamically stable   Neutropenia associated with infection *this has resolved  Bacteremia- Coag neg Staph Cont Ancef- repeat blood cultures- source may have been his stump- no obvious signs of abscess or cellulitis at present but stump was quite tender on the day I first evaluated him- this has improved.    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 CHF (congestive heart failure) *Currently appears to be compensated - will stop IVF today   Dehydration *As noted above and likely secondary to underlying chronic medical problems in setting of acute respiratory illness *I will be discontinuing IVF and encouraging PO hydration today   PVD (peripheral vascular disease) *Recent AKA on the left *Has undergone outpatient arteriogram that revealed need for revascularization and right lower extremity when patient stable from a renal standpoint *Outpatient cardiac clearance in progress-Myoview stress test pending   History of CVA (cerebrovascular accident)/Aphasia   HTN (hypertension) *Initially blood pressure quite soft after admitted to ICU but is slowly rebounding *Norvasc resumed   DVT prophylaxis: Cutaneous Lovenox daily Code Status: Full Family Communication: Spoke with the patient Disposition Plan: transfer to med/surg  Consultants: Pulmonary critical care medicine-  Procedures: None  Antibiotics: Zosyn 9/13 >>> Vancomycin 9/13 >>> 9/13 Maxipime 9/13 >>> 9/13 Levaquin 9/13 >>>  HPI/Subjective: Patient alert and sitting up in the be- he admits to feeling much better today- cough is mild   Objective: Blood pressure 139/89, pulse 95, temperature 99.4 F (37.4 C), temperature source Oral, resp. rate 20, height 5\' 5"  (1.651 m), weight 56.1 kg (123 lb 10.9 oz), SpO2 97.00%.  Intake/Output Summary (Last 24 hours) at 10/25/11 1605 Last data filed at 10/25/11 1300  Gross per 24 hour  Intake 1477.5 ml  Output   1935 ml  Net -457.5 ml     Exam: Gen- alert, no distress Lungs- mild ronchi and mild basilar crackles CVS- RRR, no murmurs Abd- soft, NT, ND, BS+ Ext- no c/c/e- stump is warm, nontended, incision clean.   Data Reviewed: Basic Metabolic Panel:  Lab 10/25/11 1478 10/24/11 0600 10/23/11 0425 10/22/11 2243 10/22/11 2208  NA 139 136 139 142 138  K 3.4* 3.9 4.8 4.2 4.3  CL 103  105 106 107 102  CO2 26 20 24  -- 24  GLUCOSE 90 90 108* 132* 140*  BUN 11 20 24* 21 20  CREATININE 0.89 0.93 1.07 1.10 0.98  CALCIUM 9.6 9.3 9.4 -- 9.6  MG -- -- 1.7 -- --  PHOS -- -- 3.8 -- --   Liver Function Tests:  Lab 10/24/11 0600 10/22/11 2208  AST 18 15  ALT 14 20  ALKPHOS 57 79  BILITOT 0.5 0.1*  PROT 6.4 7.4  ALBUMIN 2.5* 3.2*    Lab 10/24/11 1005  LIPASE 29  AMYLASE --   No results found for this basename: AMMONIA:5 in the last 168 hours CBC:  Lab 10/25/11 0655 10/24/11 0600 10/23/11 0425 10/22/11 2243 10/22/11 2208  WBC 8.0 7.3 1.8* -- 2.7*  NEUTROABS -- 5.9 -- -- 1.7  HGB 11.8* 11.8* 13.5 13.9 13.1  HCT 34.0* 36.0* 41.7 41.0 39.9  MCV 79.3 81.8 82.9 -- 81.8  PLT 250 222 262 -- 303   Cardiac Enzymes:  Lab 10/24/11 1005  CKTOTAL 425*  CKMB --  CKMBINDEX --  TROPONINI --   BNP (last 3 results)  Basename 10/22/11 2208 02/14/11 0500 02/13/11 0830  PROBNP 69.3 104.1 182.0*   CBG:  Lab 10/25/11 1120 10/25/11 0736 10/24/11 2218 10/24/11 1958 10/24/11 1530  GLUCAP 78 90 103* 138* 84    Recent Results (from the past 240 hour(s))  URINE CULTURE     Status: Normal   Collection Time   10/22/11  9:35 PM      Component Value Range Status Comment   Specimen Description URINE, CATHETERIZED   Final    Special Requests NONE   Final    Culture  Setup Time 10/23/2011 00:14   Final    Colony Count NO GROWTH   Final    Culture NO GROWTH   Final    Report Status 10/24/2011 FINAL   Final   CULTURE, BLOOD (ROUTINE X 2)     Status: Normal   Collection Time   10/22/11  9:40 PM      Component Value Range Status Comment   Specimen Description BLOOD RIGHT HAND   Final    Special Requests BOTTLES DRAWN AEROBIC AND ANAEROBIC 10CC   Final    Culture  Setup Time 10/23/2011 04:30   Final    Culture     Final    Value: STAPHYLOCOCCUS SPECIES (COAGULASE NEGATIVE)     Note: SUSCEPTIBILITIES PERFORMED ON PREVIOUS CULTURE WITHIN THE LAST 5 DAYS.     Note: Gram Stain  Report Called to,Read Back By and Verified With: [facility] @ 10:44PM 10/23/11 BY THOMI.   Report Status 10/25/2011 FINAL   Final   CULTURE, BLOOD (ROUTINE X 2)     Status: Normal   Collection Time   10/22/11 10:00 PM      Component Value Range Status Comment   Specimen Description BLOOD RIGHT HAND   Final    Special Requests BOTTLES DRAWN AEROBIC AND ANAEROBIC 10CC   Final    Culture  Setup Time 10/23/2011 04:30   Final    Culture     Final    Value: STAPHYLOCOCCUS SPECIES (COAGULASE NEGATIVE)     Note: RIFAMPIN AND GENTAMICIN SHOULD NOT BE USED AS SINGLE DRUGS FOR TREATMENT OF STAPH INFECTIONS.     13 Note: Gram Stain Report Called to,Read Back By and Verified With: RN  BRIAN MACK AT 10:44PM 9 2013 BY THOMI   Report Status 10/25/2011 FINAL   Final    Organism ID, Bacteria STAPHYLOCOCCUS SPECIES (COAGULASE NEGATIVE)   Final   MRSA PCR SCREENING     Status: Abnormal   Collection Time   10/23/11  5:50 AM      Component Value Range Status Comment   MRSA by PCR POSITIVE (*) NEGATIVE Final      Studies:  Recent x-ray studies have been reviewed in detail by the Attending Physician  Scheduled Meds:  Reviewed in detail by the Attending Physician   Junious Silk, ANP [facility] Office  [phone] Pager [phone]  On-Call/Text Page:      [email or web address]      password [email or web address]  If 7PM-7AM, please contact night-coverage [email or web address] Password [email or web address] 10/25/2011, 4:05 PM   LOS: 3 days   I have examined the patient, reviewed the chart and modified the above note which I agree with.   Calvert Cantor, MD [phone]

## 2011-10-25 NOTE — Progress Notes (Addendum)
ANTIBIOTIC CONSULT NOTE - Follow-up Pharmacy Consult for ancef and azithromycin Indication: HCAP; CNS bacteremia  Allergies  Allergen Reactions  . Almond Oil Shortness Of Breath and Swelling    Patient Measurements: Weight: 52kg  Vital Signs: Temp: 99.3 F (37.4 C) (09/15 1200) BP: 153/88 mmHg (09/15 1100) Pulse Rate: 91  (09/15 1200)  Labs:  Basename 10/25/11 0655 10/24/11 0600 10/23/11 0425  WBC 8.0 7.3 1.8*  HGB 11.8* 11.8* 13.5  PLT 250 222 262  LABCREA -- -- --  CREATININE 0.89 0.93 1.07    Microbiology: Recent Results (from the past 720 hour(s))  URINE CULTURE     Status: Normal   Collection Time   10/22/11  9:35 PM      Component Value Range Status Comment   Specimen Description URINE, CATHETERIZED   Final    Special Requests NONE   Final    Culture  Setup Time 10/23/2011 00:14   Final    Colony Count NO GROWTH   Final    Culture NO GROWTH   Final    Report Status 10/24/2011 FINAL   Final   CULTURE, BLOOD (ROUTINE X 2)     Status: Normal   Collection Time   10/22/11  9:40 PM      Component Value Range Status Comment   Specimen Description BLOOD RIGHT HAND   Final    Special Requests BOTTLES DRAWN AEROBIC AND ANAEROBIC 10CC   Final    Culture  Setup Time 10/23/2011 04:30   Final    Culture     Final    Value: STAPHYLOCOCCUS SPECIES (COAGULASE NEGATIVE)     Note: SUSCEPTIBILITIES PERFORMED ON PREVIOUS CULTURE WITHIN THE LAST 5 DAYS.     Note: Gram Stain Report Called to,Read Back By and Verified With: [facility] @ 10:44PM 10/23/11 BY THOMI.   Report Status 10/25/2011 FINAL   Final   CULTURE, BLOOD (ROUTINE X 2)     Status: Normal   Collection Time   10/22/11 10:00 PM      Component Value Range Status Comment   Specimen Description BLOOD RIGHT HAND   Final    Special Requests BOTTLES DRAWN AEROBIC AND ANAEROBIC 10CC   Final    Culture  Setup Time 10/23/2011 04:30   Final    Culture     Final    Value: STAPHYLOCOCCUS SPECIES (COAGULASE NEGATIVE)     Note:  RIFAMPIN AND GENTAMICIN SHOULD NOT BE USED AS SINGLE DRUGS FOR TREATMENT OF STAPH INFECTIONS.     13 Note: Gram Stain Report Called to,Read Back By and Verified With: RN [facility] AT 10:44PM 9 2013 BY THOMI   Report Status 10/25/2011 FINAL   Final    Organism ID, Bacteria STAPHYLOCOCCUS SPECIES (COAGULASE NEGATIVE)   Final   MRSA PCR SCREENING     Status: Abnormal   Collection Time   10/23/11  5:50 AM      Component Value Range Status Comment   MRSA by PCR POSITIVE (*) NEGATIVE Final     Medical History: Past Medical History  Diagnosis Date  . Cardiomyopathy     EF 20-25% by echo 01/2010 with severe LVH felt secondary to substance abuse (no ischemic workup)  . 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 Diabetes mellitus   . HTN (hypertension)   . Polysubstance abuse     Reported hx of cocaine, THC, heroin, EtOH abuse  .  Noncompliance   . Acute renal failure     Assessment: 56 yo male admitted from SNF due to AMS and hypoxemia, CT negative for PE but positive for PNA.   Cultures today reveals CNS with sensitivities listed below.  Ancef should cover both S pneumo PNA as well as CNS.  Received orders from K Whiteheart to d/c vanc, zosyn, levofloxacin and start ancef per pharmacy.  Also discussed with Dr Marchelle Gearing who recommended double coverage for a total of 5 days for Spneumo treatment in ICU patient.  Since patient has already had 2 days of double coverage, will order 3 add'l days of azithromycin.  9/13 vanc >>9/13, resumed 9/14>>9/15 9/13 zosyn>>9/15 9/13 levofloxacin>>9/15  9/12: bld x 2: GPC clusters; CNS, R-clindamycin, erythromycin, levofloxacin, PCN; SENS to gent, oxacillin, rifampin, tetracycline, vancomycin 9/12: urine: neg 9/13: MRSA PCA positive 9/13: strep pneumo Urinary ag positive  Goal of Therapy:  Irradication of infection  Plan:  - Ancef 2g IV q8h - Follow up SCr, UOP, cultures, clinical course  and adjust as clinically indicated - Azithromycin 500 mg IV q24h x 3 days - 1st dose due 9/16 am  Curties Conigliaro L. Illene Bolus, PharmD, BCPS Clinical Pharmacist Pager: [phone] Pharmacy: [phone] 10/25/2011 12:56 PM

## 2011-10-25 NOTE — Plan of Care (Signed)
Problem: ICU Phase Progression Outcomes Goal: O2 sats trending toward baseline Outcome: Progressing o2 at 2L via Nasal cannula  Problem: Phase I Progression Outcomes Goal: Progress activity as tolerated unless otherwise ordered Outcome: Progressing OOB to chair

## 2011-10-25 NOTE — Progress Notes (Signed)
Speech Language Pathology Dysphagia Treatment Patient Details Name: Jeremy Howell MRN: [id number] DOB: 1955/08/08 Today's Date: 10/25/2011 Time: 4098-1191 SLP Time Calculation (min): 45 min  Assessment / Plan / Recommendation Clinical Impression  Purpose of diagnostic treatment for follow up for diet tolerance of  dysphagia 2 (finely chopped) and thin liquids from recommendations made from MBS completed 10/24/11.  Per MBS no aspiration or penetration noted  with thin liquids but risk for aspiration s/p swallow remains due to noted backflow and pooling at CP segment. RN reports patient with increased coughing prior to transfer to current floor from ICU.   Recommend to downgrade to dysphagia 1 and nectar thick liquids due to current compromised respiratory status and due to limited reserve to decrease risk of aspiration. Recommend to continue full supervision with all meals to cue patient to complete recommended strategies .  Continue aspiration and reflux precautions.  ST to follow for diet tolerance and possible diet upgrade as tolerated.      Diet Recommendation  Initiate / Change Diet: Dysphagia 1 (puree);Nectar-thick liquid    SLP Plan Continue with current plan of care      Swallowing Goals  SLP Swallowing Goals Patient will consume recommended diet without observed clinical signs of aspiration with: Moderate assistance Swallow Study Goal #1 - Progress: Revised (modified due to lack of progress/goal met) Patient will utilize recommended strategies during swallow to increase swallowing safety with: Moderate assistance Swallow Study Goal #2 - Progress: Revised (modified due to lack of progress/goal met)  General Temperature Spikes Noted: No Respiratory Status: Other (comment) (nasal cannula) Behavior/Cognition: Alert;Cooperative;Pleasant mood Oral Cavity - Dentition: Poor condition;Missing dentition  Oral Cavity - Oral Hygiene Does patient have any of the following "at risk" factors?:  Other - dysphagia;Oxygen therapy - cannula, mask, simple oxygen devices Patient is HIGH RISK - Oral Care Protocol followed (see row info): Yes Patient is AT RISK - Oral Care Protocol followed (see row info): Yes   Dysphagia Treatment Treatment focused on: Skilled observation of diet tolerance;Patient/family/caregiver education;Facilitation of oral preparatory phase;Facilitation of pharyngeal phase;Utilization of compensatory strategies Treatment Methods/Modalities: Skilled observation;Differential diagnosis Patient observed directly with PO's: Yes Type of PO's observed: Dysphagia 1 (puree);Thin liquids;Nectar-thick liquids Feeding: Needs assist Liquids provided via: Cup;Straw;Teaspoon Pharyngeal Phase Signs & Symptoms: Delayed cough;Suspected delayed swallow initiation Type of cueing: Verbal;Tactile;Visual Amount of cueing: Moderate   GO Moreen Fowler MS, CCC-SLP [phone]  [facility] 10/25/2011, 5:48 PM

## 2011-10-25 NOTE — Treatment Plan (Signed)
Lab tech in to draw blood cultures per order.  Patient refuses second lab stick.

## 2011-10-26 ENCOUNTER — Encounter (HOSPITAL_COMMUNITY): Payer: Medicare Other

## 2011-10-26 DIAGNOSIS — D703 Neutropenia due to infection: Secondary | ICD-10-CM

## 2011-10-26 DIAGNOSIS — I739 Peripheral vascular disease, unspecified: Secondary | ICD-10-CM

## 2011-10-26 DIAGNOSIS — B958 Unspecified staphylococcus as the cause of diseases classified elsewhere: Secondary | ICD-10-CM

## 2011-10-26 LAB — BASIC METABOLIC PANEL
Calcium: 9.6 mg/dL (ref 8.4–10.5)
GFR calc Af Amer: >90 mL/min (ref >90)
GFR calc non Af Amer: >90 mL/min (ref >90)
Glucose, Bld: 89 mg/dL (ref 70–99)
Potassium: 3.6 mEq/L (ref 3.5–5.1)
Sodium: 140 mEq/L (ref 135–145)

## 2011-10-26 LAB — GLUCOSE, CAPILLARY: Glucose-Capillary: 82 mg/dL (ref 70–99)

## 2011-10-26 LAB — LEGIONELLA ANTIGEN, URINE: Legionella Antigen, Urine: NEGATIVE

## 2011-10-26 LAB — PROCALCITONIN: Procalcitonin: 29.06 ng/mL

## 2011-10-26 MED ORDER — CHLORHEXIDINE GLUCONATE CLOTH 2 % EX PADS
6.0000 | MEDICATED_PAD | Freq: Every day | CUTANEOUS | Status: DC
  Administered 2011-10-26 – 2011-10-29 (×4): 6 via TOPICAL

## 2011-10-26 MED ORDER — MUPIROCIN 2 % EX OINT
1.0000 "application " | TOPICAL_OINTMENT | Freq: Two times a day (BID) | CUTANEOUS | Status: DC
  Administered 2011-10-26 – 2011-10-29 (×7): 1 via NASAL
  Filled 2011-10-26: qty 22

## 2011-10-26 NOTE — Progress Notes (Signed)
Discussed benefits, risks, and alternatives for PICC line placement. Patient stated that he wants to think about it overnight and give an answer in the AM. Primary RN Harriett Sine notified. Christeen Douglas RN [facility]

## 2011-10-26 NOTE — Consult Note (Signed)
INFECTIOUS DISEASE CONSULT NOTE  Date of Admission:  10/22/2011  Date of Consult:  10/26/2011  Reason for Consult: MSSE Bacteremia Referring Physician: Penne Lash  Impression/Recommendation MSSE Bacteremia Pneumonia  Would- continue his ancef Can stop azithro Check MRI of his stump Await repeat BCx (9-15)  Comment- if MRI negative, would consider his instrumentation a possible source. Would treat for 2 weeks with ancef.  If there is any evidence of infection on his imaging, would treat for at least 4-6 weeks.  thanks       Thank you so much for this interesting consult,   Jeremy Howell [phone]  Jeremy Howell is an 56 y.o. male.  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  He had previously been admitted on 9-6 with findings of R popliteal artery occlusion on an aortogram. Pt comes to hospital on 9-12 after being found unresponsive. He awakened slowly with narcan.  In the ED he had temp 100. 9 and was hypoxic on RA to the 60s. He was started on vanco/zosyn/levaquin, admitted to step-down. He had CT angiogram showing pneumonia (no PE). He was noted to have leukopenia which has since improved. He was noted to have some tenderness of his AKA site. This has since resolved.  His urine strep Ag is noted to be + and his BCx have grown MSSE. His anbx have been changed to azithro and Ancef  Past Medical History  Diagnosis Date  . Cardiomyopathy     EF 20-25% by echo 01/2010 with severe LVH felt secondary to substance abuse (no ischemic workup)  . 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 Diabetes mellitus   . HTN (hypertension)   . Polysubstance abuse     Reported hx of cocaine, THC, heroin, EtOH abuse  . Noncompliance   . Acute renal failure     Past Surgical History  Procedure Date  . Total hip arthroplasty   . Amputation 08/15/2011    Procedure:  AMPUTATION ABOVE KNEE;  Surgeon: Sherren Kerns, MD;  Location: [facility] OR;  Service: Vascular;  Laterality: Left;  Amputation End:  4540     Allergies  Allergen Reactions  . Almond Oil Shortness Of Breath and Swelling    Medications:  Scheduled:   . amLODipine  10 mg Oral Daily  . aspirin  325 mg Oral Daily  . azithromycin  500 mg Intravenous Q24H  . carvedilol  25 mg Oral BID WC  .  ceFAZolin (ANCEF) IV  2 g Intravenous Q8H  . Chlorhexidine Gluconate Cloth  6 each Topical Q0600  . enoxaparin (LOVENOX) injection  40 mg Subcutaneous Q24H  . folic acid  1 mg Oral Daily  . insulin aspart  0-5 Units Subcutaneous QHS  . insulin aspart  0-9 Units Subcutaneous TID WC  . insulin glargine  5 Units Subcutaneous QHS  . mupirocin ointment  1 application Nasal BID  . mupirocin ointment   Nasal BID  . nicotine  21 mg Transdermal Q24H  . simvastatin  10 mg Oral q1800  . DISCONTD: sodium chloride  3 mL Intravenous Q12H    Total days of antibiotics 6  Social History:  reports that he has been smoking Cigarettes.  He has a 7.5 pack-year smoking history. He uses smokeless tobacco. He reports that he drinks about one ounce of alcohol per week. He reports that he does  not use illicit drugs.  Family History  Problem Relation Age of Onset  . Stroke Brother     General ROS: normal vision, no dysphagia, normal BM, normal urination, see HPI  Blood pressure 118/74, pulse 78, temperature 98.7 F (37.1 C), temperature source Oral, resp. rate 18, height 5\' 5"  (1.651 m), weight 123 lb 10.9 oz (56.1 kg), SpO2 91.00%. General appearance: alert, cooperative and no distress Eyes: negative findings: EOMI Throat: normal findings: oropharynx pink & moist without lesions or evidence of thrush and poor dentition Neck: no adenopathy and L neck IJ, non-tender Lungs: clear to auscultation bilaterally Heart: regular rate and rhythm Abdomen: normal findings: bowel sounds normal and soft, non-tender Extremities:  dry skin R foot, tenderness, unable to feel pulse. L AKA site is warm and tender. there is no fluctuance. the site is tendern. there are 2 scabs, neither express d/c.    Results for orders placed during the hospital encounter of 10/22/11 (from the past 48 hour(s))  GLUCOSE, CAPILLARY     Status: Normal   Collection Time   10/24/11  3:30 PM      Component Value Range Comment   Glucose-Capillary 84  70 - 99 mg/dL    Comment 1 Notify RN     GLUCOSE, CAPILLARY     Status: Abnormal   Collection Time   10/24/11  7:58 PM      Component Value Range Comment   Glucose-Capillary 138 (*) 70 - 99 mg/dL    Comment 1 Notify RN     GLUCOSE, CAPILLARY     Status: Abnormal   Collection Time   10/24/11 10:18 PM      Component Value Range Comment   Glucose-Capillary 103 (*) 70 - 99 mg/dL   BASIC METABOLIC PANEL     Status: Abnormal   Collection Time   10/25/11  6:55 AM      Component Value Range Comment   Sodium 139  135 - 145 mEq/L    Potassium 3.4 (*) 3.5 - 5.1 mEq/L    Chloride 103  96 - 112 mEq/L    CO2 26  19 - 32 mEq/L    Glucose, Bld 90  70 - 99 mg/dL    BUN 11  6 - 23 mg/dL    Creatinine, Ser 1.61  0.50 - 1.35 mg/dL    Calcium 9.6  8.4 - 09.6 mg/dL    GFR calc non Af Amer >90  >90 mL/min    GFR calc Af Amer >90  >90 mL/min   CBC     Status: Abnormal   Collection Time   10/25/11  6:55 AM      Component Value Range Comment   WBC 8.0  4.0 - 10.5 K/uL    RBC 4.29  4.22 - 5.81 MIL/uL    Hemoglobin 11.8 (*) 13.0 - 17.0 g/dL    HCT 04.5 (*) 40.9 - 52.0 %    MCV 79.3  78.0 - 100.0 fL    MCH 27.5  26.0 - 34.0 pg    MCHC 34.7  30.0 - 36.0 g/dL    RDW 81.1  91.4 - 78.2 %    Platelets 250  150 - 400 K/uL   GLUCOSE, CAPILLARY     Status: Normal   Collection Time   10/25/11  7:36 AM      Component Value Range Comment   Glucose-Capillary 90  70 - 99 mg/dL   GLUCOSE, CAPILLARY     Status: Normal  Collection Time   10/25/11 11:20 AM      Component Value Range Comment   Glucose-Capillary 78  70  - 99 mg/dL   CULTURE, BLOOD (ROUTINE X 2)     Status: Normal (Preliminary result)   Collection Time   10/25/11  5:10 PM      Component Value Range Comment   Specimen Description BLOOD LEFT HAND      Special Requests BOTTLES DRAWN AEROBIC ONLY 10CC      Culture  Setup Time 10/25/2011 23:06      Culture        Value:        BLOOD CULTURE RECEIVED NO GROWTH TO DATE CULTURE WILL BE HELD FOR 5 DAYS BEFORE ISSUING A FINAL NEGATIVE REPORT   Report Status PENDING     GLUCOSE, CAPILLARY     Status: Normal   Collection Time   10/25/11  5:17 PM      Component Value Range Comment   Glucose-Capillary 95  70 - 99 mg/dL    Comment 1 Notify RN     GLUCOSE, CAPILLARY     Status: Abnormal   Collection Time   10/25/11 10:41 PM      Component Value Range Comment   Glucose-Capillary 139 (*) 70 - 99 mg/dL    Comment 1 Notify RN      Comment 2 Documented in Chart     PROCALCITONIN     Status: Normal   Collection Time   10/26/11  6:30 AM      Component Value Range Comment   Procalcitonin 29.06     BASIC METABOLIC PANEL     Status: Normal   Collection Time   10/26/11  6:30 AM      Component Value Range Comment   Sodium 140  135 - 145 mEq/L    Potassium 3.6  3.5 - 5.1 mEq/L    Chloride 103  96 - 112 mEq/L    CO2 27  19 - 32 mEq/L    Glucose, Bld 89  70 - 99 mg/dL    BUN 10  6 - 23 mg/dL    Creatinine, Ser 1.61  0.50 - 1.35 mg/dL    Calcium 9.6  8.4 - 09.6 mg/dL    GFR calc non Af Amer >90  >90 mL/min    GFR calc Af Amer >90  >90 mL/min   GLUCOSE, CAPILLARY     Status: Normal   Collection Time   10/26/11  7:55 AM      Component Value Range Comment   Glucose-Capillary 94  70 - 99 mg/dL   GLUCOSE, CAPILLARY     Status: Abnormal   Collection Time   10/26/11 12:05 PM      Component Value Range Comment   Glucose-Capillary 146 (*) 70 - 99 mg/dL    Comment 1 Notify RN         Component Value Date/Time   SDES BLOOD LEFT HAND 10/25/2011 1710   SPECREQUEST BOTTLES DRAWN AEROBIC ONLY 10CC 10/25/2011 1710     CULT        BLOOD CULTURE RECEIVED NO GROWTH TO DATE CULTURE WILL BE HELD FOR 5 DAYS BEFORE ISSUING A FINAL NEGATIVE REPORT 10/25/2011 1710   REPTSTATUS PENDING 10/25/2011 1710   No results found. Recent Results (from the past 240 hour(s))  URINE CULTURE     Status: Normal   Collection Time   10/22/11  9:35 PM      Component Value Range Status  Comment   Specimen Description URINE, CATHETERIZED   Final    Special Requests NONE   Final    Culture  Setup Time 10/23/2011 00:14   Final    Colony Count NO GROWTH   Final    Culture NO GROWTH   Final    Report Status 10/24/2011 FINAL   Final   CULTURE, BLOOD (ROUTINE X 2)     Status: Normal   Collection Time   10/22/11  9:40 PM      Component Value Range Status Comment   Specimen Description BLOOD RIGHT HAND   Final    Special Requests BOTTLES DRAWN AEROBIC AND ANAEROBIC 10CC   Final    Culture  Setup Time 10/23/2011 04:30   Final    Culture     Final    Value: STAPHYLOCOCCUS SPECIES (COAGULASE NEGATIVE)     Note: SUSCEPTIBILITIES PERFORMED ON PREVIOUS CULTURE WITHIN THE LAST 5 DAYS.     Note: Gram Stain Report Called to,Read Back By and Verified With: [facility] @ 10:44PM 10/23/11 BY THOMI.   Report Status 10/25/2011 FINAL   Final   CULTURE, BLOOD (ROUTINE X 2)     Status: Normal   Collection Time   10/22/11 10:00 PM      Component Value Range Status Comment   Specimen Description BLOOD RIGHT HAND   Final    Special Requests BOTTLES DRAWN AEROBIC AND ANAEROBIC 10CC   Final    Culture  Setup Time 10/23/2011 04:30   Final    Culture     Final    Value: STAPHYLOCOCCUS SPECIES (COAGULASE NEGATIVE)     Note: RIFAMPIN AND GENTAMICIN SHOULD NOT BE USED AS SINGLE DRUGS FOR TREATMENT OF STAPH INFECTIONS.     13 Note: Gram Stain Report Called to,Read Back By and Verified With: RN [facility] AT 10:44PM 9 2013 BY THOMI   Report Status 10/25/2011 FINAL   Final    Organism ID, Bacteria STAPHYLOCOCCUS SPECIES (COAGULASE NEGATIVE)   Final   MRSA PCR  SCREENING     Status: Abnormal   Collection Time   10/23/11  5:50 AM      Component Value Range Status Comment   MRSA by PCR POSITIVE (*) NEGATIVE Final   CULTURE, BLOOD (ROUTINE X 2)     Status: Normal (Preliminary result)   Collection Time   10/25/11  5:10 PM      Component Value Range Status Comment   Specimen Description BLOOD LEFT HAND   Final    Special Requests BOTTLES DRAWN AEROBIC ONLY 10CC   Final    Culture  Setup Time 10/25/2011 23:06   Final    Culture     Final    Value:        BLOOD CULTURE RECEIVED NO GROWTH TO DATE CULTURE WILL BE HELD FOR 5 DAYS BEFORE ISSUING A FINAL NEGATIVE REPORT   Report Status PENDING   Incomplete       10/26/2011, 2:06 PM     LOS: 4 days

## 2011-10-26 NOTE — Progress Notes (Signed)
Okey Regal RN made aware that the pt has an EJ and that that VAS-team in not need for routine care. Consuello Masse

## 2011-10-26 NOTE — ED Provider Notes (Signed)
I saw and evaluated the patient, reviewed the resident's note and I agree with the findings and plan and agree with their ECG interpretation. Patient with altered mental status. Improved with Narcan. Patient had continued oxygen requirement and CT angiography of the chest was done. Patient later developed a fever. Patient be admitted and was seen by critical care.  CRITICAL CARE Performed by: Billee Cashing   Total critical care time: 30  Critical care time was exclusive of separately billable procedures and treating other patients.  Critical care was necessary to treat or prevent imminent or life-threatening deterioration.  Critical care was time spent personally by me on the following activities: development of treatment plan with patient and/or surrogate as well as nursing, discussions with consultants, evaluation of patient's response to treatment, examination of patient, obtaining history from patient or surrogate, ordering and performing treatments and interventions, ordering and review of laboratory studies, ordering and review of radiographic studies, pulse oximetry and re-evaluation of patient's condition.  Jeremy Howell. Jeremy Payor, MD 10/26/11 2318

## 2011-10-26 NOTE — Evaluation (Signed)
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  Pt also presents with the below PT problem list.  Pt would benefit from acute PT to maximize independence and facilitate d/c back to SNF.    PT Assessment  Patient needs continued PT services    Follow Up Recommendations  Skilled nursing facility    Barriers to Discharge None      Equipment Recommendations  Defer to next venue    Recommendations for Other Services     Frequency Min 2X/week    Precautions / Restrictions Precautions Precautions: Fall Restrictions Weight Bearing Restrictions: No   Pertinent Vitals/Pain None      Mobility  Bed Mobility Bed Mobility: Supine to Sit;Sit to Supine Supine to Sit: 3: Mod assist;HOB flat Sit to Supine: 3: Mod assist;HOB flat Details for Bed Mobility Assistance: Assist to facilitate rotation of pelvis to bring trunk to EOB as well as to trunk to translate anterior due to posterior lean/extension.  Cues for sequence and safety. Transfers Transfers: Sit to Stand;Stand to Sit;Squat Pivot Transfers (4 trials.) Sit to Stand: 1: +2 Total assist;From bed Sit to Stand: Patient Percentage: 40% Stand to Sit: 1: +2 Total assist;To bed Stand to Sit: Patient Percentage: 40% Squat Pivot Transfers: 1: +2 Total assist Squat Pivot Transfers: Patient Percentage: 40% Details for Transfer Assistance: Assist at sacrum using bed pad to translate trunk anterior over BOS with blocking to right knee/foot to prevent buckling/sliding.  Cues for sequence and safety. Ambulation/Gait Ambulation/Gait Assistance: Not tested (comment) Stairs: No Wheelchair Mobility Wheelchair Mobility: No      Exercises     PT Diagnosis: Generalized weakness  PT Problem List: Decreased strength;Decreased activity tolerance;Decreased balance;Decreased mobility;Decreased cognition PT Treatment Interventions: Functional mobility training;Therapeutic activities;Therapeutic exercise;Balance training;Cognitive remediation;Patient/family education   PT Goals Acute Rehab PT Goals PT Goal Formulation: With patient Time For Goal Achievement: 11/09/11 Potential to Achieve Goals: Good Pt will go Supine/Side to Sit: with supervision PT Goal: Supine/Side to Sit - Progress: Goal set today Pt will go Sit to Supine/Side: with supervision PT Goal: Sit to Supine/Side - Progress: Goal set today Pt will go Sit to Stand: with supervision PT Goal: Sit to Stand - Progress: Goal set today Pt will go Stand to Sit: with supervision PT Goal: Stand to Sit - Progress: Goal set today Pt will Transfer Bed to Chair/Chair to Bed: with supervision PT Transfer Goal: Bed to Chair/Chair to Bed - Progress: Goal set today  Visit Information  Last PT Received On: 10/26/11 Assistance Needed: +1    Subjective Data  Subjective: Pt with expressive aphasia, but able to make needs known with answers of "yes" and "no" along with gestures. Patient Stated Goal: Agree to work with therapy to get strong.   Prior Functioning  Home Living Lives With: Other (Comment) (At [facility] Available Help at Discharge: Skilled Nursing Facility Type of Home: Skilled Nursing Facility Home Layout: One level Prior Function Level of Independence: Needs assistance Able to Take Stairs?: No Driving: No Communication Communication: Expressive difficulties    Cognition  Overall Cognitive Status: Appears within functional limits for tasks assessed/performed Arousal/Alertness: Awake/alert Orientation Level: Disoriented to;Time Behavior During Session: [facility] for tasks performed    Extremity/Trunk Assessment Right  Upper Extremity Assessment RUE  ROM/Strength/Tone: Deficits (Premorbid weakness from CVA) Left Upper Extremity Assessment LUE ROM/Strength/Tone: Within functional levels LUE Coordination: WFL - gross motor Right Lower Extremity Assessment RLE ROM/Strength/Tone: Deficits (Premorbid weakness from CVA) RLE ROM/Strength/Tone Deficits: 3+/5 RLE Coordination: WFL - gross motor Left Lower Extremity Assessment LLE ROM/Strength/Tone: WFL for tasks assessed (Left AKA.) Trunk Assessment Trunk Assessment: Normal   Balance Balance Balance Assessed: Yes Static Sitting Balance Static Sitting - Balance Support: Bilateral upper extremity supported;Feet supported Static Sitting - Level of Assistance: 3: Mod assist Static Sitting - Comment/# of Minutes: Pt sat EOB for 10 minutes total with up to mod assist due to posterior/right sided lean from premorbid weakness.  Cues/assist to shift weight over BOS and attain/maintain midline trunk.  Pt able to progress to min (guard).  End of Session PT - End of Session Activity Tolerance: Patient tolerated treatment well Patient left: in bed;with call bell/phone within reach Nurse Communication: Mobility status  GP     Cephus Shelling 10/26/2011, 8:15 AM  10/26/2011 Cephus Shelling, PT, DPT [phone]

## 2011-10-26 NOTE — Progress Notes (Signed)
Name: Jeremy Howell Age MRN: [id number] DOB: 09/05/1955  LOS: 4  Requesting Physician: Nicanor Alcon EDP Reason for consult: hypoxemic respiratory failure  CRITICAL CARE ADMISSION NOTE  History of Present Illness: 56 year old male with a prior CVA, cardiomyopathy ef 35%, and recent AKA in a nursing home to ER 10/22/2011 with AMS and hypoxemic respiratory failure. In the emergency room he received Narcan and his mental status improved   found to be markedly hypoxemic recurring a 100% nonrebreather. Chest x-ray was initially found to be negative but a followup CT angiogram chest showed by basilar airspace disease consistent with pneumonia. Pulmonary critical care medicine was consult for further evaluation of his hypoxemic respiratory failure.   Lines / Drains: Peripheral IV  Cultures / Sepsis markers: 9/12 blood >>2/2 coag neg staph  9/12 urine >>neg URINE STREP PNA  POSITIVE on 10/23/11   Antibiotics: 9/13 vanc >> 9/15 9/13 zosyn >> 9/15 9/13 Levaquin >> 9/15 9/15 Ancef >>> per ID 9/15 Zmax> per ID   Tests / Events: 9/12 CT Head >> NAICP, old left tempo-parietal and R parietal infarcts 9/13 CT chest >> no pulmonary embolism, bibasilar pneumonia 10/24/11-  Speech evaluation D2 diet     SUBJECTIVE/OVERNIGHT/INTERVAL HX Hypoxia much improved.  Now tol nasal cannula.     Vital Signs:   Filed Vitals:   10/25/11 1541 10/25/11 2240 10/26/11 0538 10/26/11 1337  BP: 139/89 124/76 133/80 118/74  Pulse: 95 91 87 78  Temp: 99.4 F (37.4 C) 99.7 F (37.6 C) 98 F (36.7 C) 98.7 F (37.1 C)  TempSrc: Oral Oral  Oral  Resp: 20 18 17 18   Height:      Weight:      SpO2: 97% 96% 95% 91%  3L [place]   Physical Examination: Gen: chronically ill appearing but in no acute distress HEENT: NCAT, PERRL, EOMi, poor dentition PULM: resps even non labored, few rhonchi and crackles bilaterally CV: RRR, no mgr, no JVD AB: BS+, soft Ext: a/p L AKA, stump normal in appearance, no edema but mildly  tender Derm: no rash or skin breakdown Neuro: awake and alert, expressive aphasia; maew Psyche: Normal mood and affect  Labs and Imaging:   CBC  Lab 10/25/11 0655 10/24/11 0600 10/23/11 0425  HGB 11.8* 11.8* 13.5  HCT 34.0* 36.0* 41.7  WBC 8.0 7.3 1.8*  PLT 250 222 262   BMET  Lab 10/26/11 0630 10/25/11 0655 10/24/11 0600 10/23/11 0425 10/22/11 2243 10/22/11 2208  NA 140 139 136 139 142 --  K 3.6 3.4* -- -- -- --  CL 103 103 105 106 107 --  CO2 27 26 20 24  -- 24  GLUCOSE 89 90 90 108* 132* --  BUN 10 11 20  24* 21 --  CREATININE 0.81 0.89 0.93 1.07 1.10 --  CALCIUM 9.6 9.6 9.3 9.4 -- 9.6  MG -- -- -- 1.7 -- --  PHOS -- -- -- 3.8 -- --   ABG  Lab 10/22/11 2243 10/22/11 2106  PHART -- 7.358  PCO2ART -- 44.9  PO2ART -- 71.0*  HCO3 -- 25.6*  TCO2 25 27  O2SAT -- 94.0    Lab 10/26/11 0630 10/24/11 0600 10/23/11 0425  PROCALCITON 29.06 93.34 28.17    Lab 10/22/11 2208  PROBNP 69.3     Assessment and Plan:  Acute Respiratory failure with hypoxia 2/2  streptococcal pna - Hypoxemia and fever much improved   Plan: -Continue ancef   -HOB > 30 degree -IS -pulm toilette -recheck PCT 10/26/11 -improving nicely -teeth may  be contributing source for recurrent pna in future/ aspiration risk?   Bacteremia - coag neg staph -? Need for TEE -ID consulted per Primary 9/16  Acute encephalopathy- Resolved Assessment: unclear etiology, now resolved s/p narcan; was there occult narcotic use?  Plan: -supportive care -frequent orientation  3) Firm abdomen Assessment: consistent with prior exams, no clear evidence of abdominal pathology 9/15 - abd soft, lipase, lactate ok   Plan:  Diet as tol    PCCM will sign off.  Please call for further assistance.    Canary Brim, NP-C [facility] Pgr: [phone] or [phone]    Pt independently  seen and examined and available cxr's reviewed and I agree with above findings/ imp/ plan . Sandrea Hughs,  MD Pulmonary and Critical Care Medicine [facility] Cell [phone]

## 2011-10-26 NOTE — Progress Notes (Signed)
Clinical Social Work  Per MD, patient should be ready to [employer]. CSW called SN[facility] and updated facility on dc plans. SNF agreeable to admission when patient is medically dc from hospital.  Unk Lightning, LCSW [phone]

## 2011-10-26 NOTE — Progress Notes (Signed)
[facility] Progress Note   Jeremy Howell ZOX:[id number] DOB: 02-May-1955 DOA: 10/22/2011 PCP: Dorrene German, MD  Brief narrative: 56 year old male patient with history of CVA. 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 Presented to the ER with reports of altered mentation and found to be markedly hypoxemic on arrival therefore required placement of 100% nonrebreather mask. History was difficult to obtain due to patient's acute altered mentation in the setting of chronic expressive aphasia. Information was obtained from the nursing facility records. No apparent fever chills prior to admission but he was expressing a productive cough. Due to the degree of hypoxemia and the fact the patient is a full code he was admitted to the step down unit for further monitoring and treatment. While in the emergency department he had been evaluated by Dr. Kendrick Fries with critical care medicine who felt the patient was not an imminent need of intubation.  Assessment/Plan:   *Acute respiratory failure with hypoxia 2/2 HCAP (healthcare-associated pneumonia)/Streptococcal pneumonia *Still requiring oxygen but we have been able to wean down to 1-2 L 89-90% on RA. *Urinary antigen positive for streptococcal pneumonia *now on Zithromax and Ancef * appreciate pulmonary f/u  *Continue supportive care   Sepsis *Continue to treat underlying causes *Currently hemodynamically stable   Neutropenia associated with infection *this has resolved  Bacteremia- Coag neg Staph Cont Ancef- repeat blood cultures- source may have been his stump- no obvious signs of abscess or cellulitis at present but stump was quite tender on the day 1- this has improved. * ?need for TEE. *? Length of treatment. We may need to place PICC once blood cultures have cleared. *Have asked Dr. Ninetta Lights with ID to see. Greatly appreciate his input!    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     PVD (peripheral vascular disease) *Recent AKA on the left *Has undergone outpatient arteriogram that revealed need for revascularization and right lower extremity when patient stable from a renal standpoint *Outpatient cardiac clearance in progress-Myoview stress test pending   History of CVA (cerebrovascular accident)/Aphasia/Dysphagia *Diet per ST recommendations.   HTN (hypertension) *Initially blood pressure quite soft after admitted to ICU but is slowly rebounding *Norvasc resumed   DVT prophylaxis: Cutaneous Lovenox daily Code Status: Full Family Communication: Spoke with the patient Disposition Plan: transfer to med/surg  Consultants: Pulmonary critical care medicine-  Procedures: None  HPI/Subjective: Patient alert and sitting up in the be- he admits to feeling much better today- cough is mild   Objective: Blood pressure 118/74, pulse 78, temperature 98.7 F (37.1 C), temperature source Oral, resp. rate 18, height 5\' 5"  (1.651 m), weight 56.1 kg (123 lb 10.9 oz), SpO2 91.00%.  Intake/Output Summary (Last 24 hours) at 10/26/11 1344 Last data filed at 10/26/11 1344  Gross per 24 hour  Intake    170 ml  Output   1000 ml  Net   -830 ml     Exam: Gen- alert, no distress Lungs- mild ronchi and mild basilar crackles CVS- RRR, no murmurs Abd- soft, NT, ND, BS+ Ext- no c/c/e- stump is warm, nontended, incision clean.   Data Reviewed: Basic Metabolic Panel:  Lab 10/26/11 8119 10/25/11 0655 10/24/11 0600 10/23/11 0425 10/22/11 2243 10/22/11 2208  NA 140 139 136 139 142 --  K 3.6 3.4* 3.9 4.8 4.2 --  CL 103 103 105  106 107 --  CO2 27 26 20 24  -- 24  GLUCOSE 89 90 90 108* 132* --  BUN 10 11 20  24* 21 --  CREATININE 0.81 0.89 0.93 1.07 1.10 --  CALCIUM 9.6 9.6 9.3 9.4 -- 9.6  MG  -- -- -- 1.7 -- --  PHOS -- -- -- 3.8 -- --   Liver Function Tests:  Lab 10/24/11 0600 10/22/11 2208  AST 18 15  ALT 14 20  ALKPHOS 57 79  BILITOT 0.5 0.1*  PROT 6.4 7.4  ALBUMIN 2.5* 3.2*    Lab 10/24/11 1005  LIPASE 29  AMYLASE --   CBC:  Lab 10/25/11 0655 10/24/11 0600 10/23/11 0425 10/22/11 2243 10/22/11 2208  WBC 8.0 7.3 1.8* -- 2.7*  NEUTROABS -- 5.9 -- -- 1.7  HGB 11.8* 11.8* 13.5 13.9 13.1  HCT 34.0* 36.0* 41.7 41.0 39.9  MCV 79.3 81.8 82.9 -- 81.8  PLT 250 222 262 -- 303   Cardiac Enzymes:  Lab 10/24/11 1005  CKTOTAL 425*  CKMB --  CKMBINDEX --  TROPONINI --   BNP (last 3 results)  Basename 10/22/11 2208 02/14/11 0500 02/13/11 0830  PROBNP 69.3 104.1 182.0*   CBG:  Lab 10/26/11 1205 10/26/11 0755 10/25/11 2241 10/25/11 1717 10/25/11 1120  GLUCAP 146* 94 139* 95 78    Recent Results (from the past 240 hour(s))  URINE CULTURE     Status: Normal   Collection Time   10/22/11  9:35 PM      Component Value Range Status Comment   Specimen Description URINE, CATHETERIZED   Final    Special Requests NONE   Final    Culture  Setup Time 10/23/2011 00:14   Final    Colony Count NO GROWTH   Final    Culture NO GROWTH   Final    Report Status 10/24/2011 FINAL   Final   CULTURE, BLOOD (ROUTINE X 2)     Status: Normal   Collection Time   10/22/11  9:40 PM      Component Value Range Status Comment   Specimen Description BLOOD RIGHT HAND   Final    Special Requests BOTTLES DRAWN AEROBIC AND ANAEROBIC 10CC   Final    Culture  Setup Time 10/23/2011 04:30   Final    Culture     Final    Value: STAPHYLOCOCCUS SPECIES (COAGULASE NEGATIVE)     Note: SUSCEPTIBILITIES PERFORMED ON PREVIOUS CULTURE WITHIN THE LAST 5 DAYS.     Note: Gram Stain Report Called to,Read Back By and Verified With: [facility] @ 10:44PM 10/23/11 BY THOMI.   Report Status 10/25/2011 FINAL   Final   CULTURE, BLOOD (ROUTINE X 2)     Status: Normal   Collection Time   10/22/11 10:00 PM       Component Value Range Status Comment   Specimen Description BLOOD RIGHT HAND   Final    Special Requests BOTTLES DRAWN AEROBIC AND ANAEROBIC 10CC   Final    Culture  Setup Time 10/23/2011 04:30   Final    Culture     Final    Value: STAPHYLOCOCCUS SPECIES (COAGULASE NEGATIVE)     Note: RIFAMPIN AND GENTAMICIN SHOULD NOT BE USED AS SINGLE DRUGS FOR TREATMENT OF STAPH INFECTIONS.     13 Note: Gram Stain Report Called to,Read Back By and Verified With: RN [facility] AT 10:44PM 9 2013 BY THOMI   Report Status 10/25/2011 FINAL   Final    Organism ID,  Bacteria STAPHYLOCOCCUS SPECIES (COAGULASE NEGATIVE)   Final   MRSA PCR SCREENING     Status: Abnormal   Collection Time   10/23/11  5:50 AM      Component Value Range Status Comment   MRSA by PCR POSITIVE (*) NEGATIVE Final   CULTURE, BLOOD (ROUTINE X 2)     Status: Normal (Preliminary result)   Collection Time   10/25/11  5:10 PM      Component Value Range Status Comment   Specimen Description BLOOD LEFT HAND   Final    Special Requests BOTTLES DRAWN AEROBIC ONLY 10CC   Final    Culture  Setup Time 10/25/2011 23:06   Final    Culture     Final    Value:        BLOOD CULTURE RECEIVED NO GROWTH TO DATE CULTURE WILL BE HELD FOR 5 DAYS BEFORE ISSUING A FINAL NEGATIVE REPORT   Report Status PENDING   Incomplete         10/26/2011, 1:44 PM   LOS: 4 days   Peggye Pitt, MD [facility] Pager: [phone]

## 2011-10-27 ENCOUNTER — Inpatient Hospital Stay (HOSPITAL_COMMUNITY): Payer: Medicare Other

## 2011-10-27 DIAGNOSIS — I509 Heart failure, unspecified: Secondary | ICD-10-CM

## 2011-10-27 DIAGNOSIS — I5042 Chronic combined systolic (congestive) and diastolic (congestive) heart failure: Secondary | ICD-10-CM

## 2011-10-27 LAB — GLUCOSE, CAPILLARY
Glucose-Capillary: 82 mg/dL (ref 70–99)
Glucose-Capillary: 80 mg/dL (ref 70–99)

## 2011-10-27 LAB — CBC
HCT: 35.5 % — ABNORMAL LOW (ref 39.0–52.0)
Hemoglobin: 11.6 g/dL — ABNORMAL LOW (ref 13.0–17.0)
RDW: 14.4 % (ref 11.5–15.5)
WBC: 6.7 10*3/uL (ref 4.0–10.5)

## 2011-10-27 LAB — BASIC METABOLIC PANEL
Chloride: 102 mEq/L (ref 96–112)
Creatinine, Ser: 0.8 mg/dL (ref 0.50–1.35)
GFR calc Af Amer: >90 mL/min (ref >90)
Potassium: 3.5 mEq/L (ref 3.5–5.1)

## 2011-10-27 MED ORDER — SODIUM CHLORIDE 0.9 % IJ SOLN
10.0000 mL | INTRAMUSCULAR | Status: DC | PRN
  Administered 2011-10-29: 10 mL

## 2011-10-27 NOTE — Progress Notes (Addendum)
[facility] Progress Note   Randee Upchurch ZOX:[id number] DOB: 30-Apr-1955 DOA: 10/22/2011 PCP: Dorrene German, MD  Brief narrative: 56 year old male patient with history of CVA. 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 Presented to the ER with reports of altered mentation and found to be markedly hypoxemic on arrival therefore required placement of 100% nonrebreather mask. History was difficult to obtain due to patient's acute altered mentation in the setting of chronic expressive aphasia. Information was obtained from the nursing facility records. No apparent fever chills prior to admission but he was expressing a productive cough. Due to the degree of hypoxemia and the fact the patient is a full code he was admitted to the step down unit for further monitoring and treatment. While in the emergency department he had been evaluated by Dr. Kendrick Fries with critical care medicine who felt the patient was not an imminent need of intubation.  Assessment/Plan:   *Acute respiratory failure with hypoxia 2/2 HCAP (healthcare-associated pneumonia)/Streptococcal pneumonia *Still requiring oxygen but we have been able to wean down to 1-2 L 89-90% on RA. *Urinary antigen positive for streptococcal pneumonia *now on  Ancef    Neutropenia associated with infection *this has resolved  Bacteremia- Coag neg Staph *Appreciate ID recommendations! *Plan at this point is to do MRI of his stump..If it is infected then ANCEF for 6 weeks; if not then Ancef for 2 weeks. *I have asked for a PICC to be placed to allow for Ancef at SNF.   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     PVD (peripheral vascular disease) *Recent AKA on the left *Has undergone outpatient arteriogram that  revealed need for revascularization of the right lower extremity when patient stable.   History of CVA (cerebrovascular accident)/Aphasia/Dysphagia *Diet per ST recommendations.   HTN (hypertension) *Initially blood pressure quite soft after admitted to ICU but is slowly rebounding *Norvasc resumed   DVT prophylaxis: Cutaneous Lovenox daily Code Status: Full Family Communication: Spoke with the patient   Consultants: Pulmonary critical care medicine-  Procedures: None  HPI/Subjective: Patient alert and sitting up in the be- he admits to feeling much better today- cough is mild   Objective: Blood pressure 120/72, pulse 81, temperature 98.5 F (36.9 C), temperature source Oral, resp. rate 20, height 5\' 5"  (1.651 m), weight 56.1 kg (123 lb 10.9 oz), SpO2 98.00%.  Intake/Output Summary (Last 24 hours) at 10/27/11 1502 Last data filed at 10/27/11 0756  Gross per 24 hour  Intake    460 ml  Output    150 ml  Net    310 ml     Exam: Gen- alert, no distress Lungs- mild ronchi and mild basilar crackles CVS- RRR, no murmurs Abd- soft, NT, ND, BS+ Ext- no c/c/e- stump is warm, nontended, incision clean.   Data Reviewed: Basic Metabolic Panel:  Lab 10/27/11 8119 10/26/11 0630 10/25/11 0655 10/24/11 0600 10/23/11 0425  NA 139 140 139 136 139  K 3.5 3.6 3.4* 3.9 4.8  CL 102 103 103 105 106  CO2 26 27 26 20 24   GLUCOSE 92 89 90 90 108*  BUN 10 10 11 20  24*  CREATININE 0.80 0.81 0.89 0.93 1.07  CALCIUM 9.6 9.6 9.6 9.3 9.4  MG -- -- -- -- 1.7  PHOS -- -- -- -- 3.8   Liver Function Tests:  Lab 10/24/11 0600 10/22/11 2208  AST 18 15  ALT 14 20  ALKPHOS 57 79  BILITOT 0.5 0.1*  PROT 6.4 7.4  ALBUMIN 2.5* 3.2*    Lab 10/24/11 1005  LIPASE 29  AMYLASE --   CBC:  Lab 10/27/11 0540 10/25/11 0655 10/24/11 0600 10/23/11 0425 10/22/11 2243 10/22/11 2208  WBC 6.7 8.0 7.3 1.8* -- 2.7*  NEUTROABS -- -- 5.9 -- -- 1.7  HGB 11.6* 11.8* 11.8* 13.5 13.9 --  HCT 35.5*  34.0* 36.0* 41.7 41.0 --  MCV 78.9 79.3 81.8 82.9 -- 81.8  PLT 276 250 222 262 -- 303   Cardiac Enzymes:  Lab 10/24/11 1005  CKTOTAL 425*  CKMB --  CKMBINDEX --  TROPONINI --   BNP (last 3 results)  Basename 10/22/11 2208 02/14/11 0500 02/13/11 0830  PROBNP 69.3 104.1 182.0*   CBG:  Lab 10/27/11 1143 10/27/11 0739 10/26/11 2132 10/26/11 1647 10/26/11 1205  GLUCAP 110* 82 89 82 146*    Recent Results (from the past 240 hour(s))  URINE CULTURE     Status: Normal   Collection Time   10/22/11  9:35 PM      Component Value Range Status Comment   Specimen Description URINE, CATHETERIZED   Final    Special Requests NONE   Final    Culture  Setup Time 10/23/2011 00:14   Final    Colony Count NO GROWTH   Final    Culture NO GROWTH   Final    Report Status 10/24/2011 FINAL   Final   CULTURE, BLOOD (ROUTINE X 2)     Status: Normal   Collection Time   10/22/11  9:40 PM      Component Value Range Status Comment   Specimen Description BLOOD RIGHT HAND   Final    Special Requests BOTTLES DRAWN AEROBIC AND ANAEROBIC 10CC   Final    Culture  Setup Time 10/23/2011 04:30   Final    Culture     Final    Value: STAPHYLOCOCCUS SPECIES (COAGULASE NEGATIVE)     Note: SUSCEPTIBILITIES PERFORMED ON PREVIOUS CULTURE WITHIN THE LAST 5 DAYS.     Note: Gram Stain Report Called to,Read Back By and Verified With: [facility] @ 10:44PM 10/23/11 BY THOMI.   Report Status 10/25/2011 FINAL   Final   CULTURE, BLOOD (ROUTINE X 2)     Status: Normal   Collection Time   10/22/11 10:00 PM      Component Value Range Status Comment   Specimen Description BLOOD RIGHT HAND   Final    Special Requests BOTTLES DRAWN AEROBIC AND ANAEROBIC 10CC   Final    Culture  Setup Time 10/23/2011 04:30   Final    Culture     Final    Value: STAPHYLOCOCCUS SPECIES (COAGULASE NEGATIVE)     Note: RIFAMPIN AND GENTAMICIN SHOULD NOT BE USED AS SINGLE DRUGS FOR TREATMENT OF STAPH INFECTIONS.     13 Note: Gram Stain Report Called  to,Read Back By and Verified With: RN [facility] AT 10:44PM 9 2013 BY THOMI   Report Status 10/25/2011 FINAL   Final    Organism ID, Bacteria STAPHYLOCOCCUS SPECIES (COAGULASE NEGATIVE)   Final   MRSA PCR SCREENING     Status: Abnormal   Collection Time   10/23/11  5:50 AM      Component Value Range Status Comment   MRSA by PCR POSITIVE (*) NEGATIVE Final   CULTURE, BLOOD (ROUTINE X 2)  Status: Normal (Preliminary result)   Collection Time   10/25/11  5:10 PM      Component Value Range Status Comment   Specimen Description BLOOD LEFT HAND   Final    Special Requests BOTTLES DRAWN AEROBIC ONLY 10CC   Final    Culture  Setup Time 10/25/2011 23:06   Final    Culture     Final    Value:        BLOOD CULTURE RECEIVED NO GROWTH TO DATE CULTURE WILL BE HELD FOR 5 DAYS BEFORE ISSUING A FINAL NEGATIVE REPORT   Report Status PENDING   Incomplete   CULTURE, BLOOD (ROUTINE X 2)     Status: Normal (Preliminary result)   Collection Time   10/25/11  8:30 PM      Component Value Range Status Comment   Specimen Description BLOOD LEFT HAND   Final    Special Requests BOTTLES DRAWN AEROBIC ONLY 3CC   Final    Culture  Setup Time 10/26/2011 14:17   Final    Culture     Final    Value:        BLOOD CULTURE RECEIVED NO GROWTH TO DATE CULTURE WILL BE HELD FOR 5 DAYS BEFORE ISSUING A FINAL NEGATIVE REPORT   Report Status PENDING   Incomplete         10/27/2011, 3:02 PM   LOS: 5 days   Peggye Pitt, MD [facility] Pager: [phone]

## 2011-10-27 NOTE — Progress Notes (Signed)
Clinical Social Work  CSW received call from SNF [facility]) regarding patient's dc plans. Per chart review, patient not ready to dc. SNF asks to be kept updated on dc plan but still agreeable to accept patient. CSW will continue to follow.  [place], [place] [phone]

## 2011-10-27 NOTE — Progress Notes (Signed)
Speech Language Pathology Dysphagia Treatment Patient Details Name: Jeremy Howell MRN: [id number] DOB: 12/27/1955 Today's Date: 10/27/2011 Time: 1207-1217 SLP Time Calculation (min): 10 min  Assessment / Plan / Recommendation Clinical Impression  Pt. seen today for dysphagia treatment.  MBS performed 9/14 with recommendation for thin liquids, however, SLP downgraded liquids to nectar 9/15 after coughing episode with RN.  Today SLP trialed pt. with thin liquids for possible upgrade followed by nectar thick juice.  No s/s immediately present however, as SLP leaving room pt. experienced brief but significant coughing (has emesis basin at bedside for expectoration of secretions).  Esophageal backflow into cervical esophagus observed during MBS.  Question if cough is due to sensation of esophageal residue/backflow or difficulty with thin liquids.  SLP recommends pt. continue Dys 1 diet and nectar thick liquids.  SLP will continue to follow and trial thin at bedside.     Diet Recommendation  Continue with Current Diet: Dysphagia 1 (puree);Nectar-thick liquid    SLP Plan Continue with current plan of care       Swallowing Goals  SLP Swallowing Goals Patient will consume recommended diet without observed clinical signs of aspiration with: Moderate assistance Swallow Study Goal #1 - Progress: Progressing toward goal Patient will utilize recommended strategies during swallow to increase swallowing safety with: Moderate assistance Swallow Study Goal #2 - Progress: Progressing toward goal  General Temperature Spikes Noted: No Respiratory Status: Room air Behavior/Cognition: Alert;Cooperative;Pleasant mood;Requires cueing Oral Cavity - Dentition: Poor condition;Missing dentition Patient Positioning: Upright in bed  Oral Cavity - Oral Hygiene Does patient have any of the following "at risk" factors?: Other - dysphagia;Diet - patient on thickened liquids Brush patient's teeth BID with toothbrush  (using toothpaste with fluoride): Yes (oral care to gums and dentition) Patient is HIGH RISK - Oral Care Protocol followed (see row info): Yes   Dysphagia Treatment Treatment focused on: Skilled observation of diet tolerance;Upgraded PO texture trials Treatment Methods/Modalities: Skilled observation Patient observed directly with PO's: Yes Type of PO's observed: Dysphagia 1 (puree);Thin liquids;Nectar-thick liquids Feeding: Able to feed self Liquids provided via: Cup;No straw Pharyngeal Phase Signs & Symptoms: Delayed cough Type of cueing: Verbal;Visual Amount of cueing: Moderate        Breck Coons [place] [phone]  10/27/2011

## 2011-10-27 NOTE — Progress Notes (Signed)
Peripherally Inserted Central Catheter/Midline Placement  The IV Nurse has discussed with the patient and/or persons authorized to consent for the patient, the purpose of this procedure and the potential benefits and risks involved with this procedure.  The benefits include less needle sticks, lab draws from the catheter and patient may be discharged home with the catheter.  Risks include, but not limited to, infection, bleeding, blood clot (thrombus formation), and puncture of an artery; nerve damage and irregular heat beat.  Alternatives to this procedure were also discussed.  PICC/Midline Placement Documentation  PICC / Midline Single Lumen 10/27/11 PICC Left Basilic (Active)       Stacie Glaze [place] 10/27/2011, 5:22 PM

## 2011-10-28 DIAGNOSIS — E119 Type 2 diabetes mellitus without complications: Secondary | ICD-10-CM

## 2011-10-28 DIAGNOSIS — B958 Unspecified staphylococcus as the cause of diseases classified elsewhere: Secondary | ICD-10-CM

## 2011-10-28 DIAGNOSIS — R7881 Bacteremia: Secondary | ICD-10-CM

## 2011-10-28 DIAGNOSIS — I10 Essential (primary) hypertension: Secondary | ICD-10-CM

## 2011-10-28 LAB — GLUCOSE, CAPILLARY
Glucose-Capillary: 123 mg/dL — ABNORMAL HIGH (ref 70–99)
Glucose-Capillary: 86 mg/dL (ref 70–99)
Glucose-Capillary: 126 mg/dL — ABNORMAL HIGH (ref 70–99)

## 2011-10-28 NOTE — Progress Notes (Signed)
Clinical Social Work  Per MD, patient will be ready to dc 10/29/11. CSW informed SNF ([facility]) of dc and SNF agreeable. SNF reports that someone called regarding patient's pasarr number. CSW searched patient in [facility] and level H has been valid since 01/31/2010. CSW called [facility] who reported this pasarr is still valid. CSW called SNF and updated them on pasarr and they are still agreeable to admission. CSW will continue to follow.  [place], [place] [phone]

## 2011-10-28 NOTE — Progress Notes (Signed)
ANTIBIOTIC CONSULT NOTE - Follow-up Pharmacy Consult for ancef Indication: HCAP; CNS bacteremia  Allergies  Allergen Reactions  . Almond Oil Shortness Of Breath and Swelling    Patient Measurements: Weight: 56kg  Vital Signs: Temp: 99.1 F (37.3 C) (09/18 0525) Temp src: Oral (09/18 0525) BP: 146/94 mmHg (09/18 0525) Pulse Rate: 96  (09/18 0158)  Labs:  Basename 10/27/11 0540 10/26/11 0630  WBC 6.7 --  HGB 11.6* --  PLT 276 --  LABCREA -- --  CREATININE 0.80 0.81    Microbiology: Recent Results (from the past 720 hour(s))  URINE CULTURE     Status: Normal   Collection Time   10/22/11  9:35 PM      Component Value Range Status Comment   Specimen Description URINE, CATHETERIZED   Final    Special Requests NONE   Final    Culture  Setup Time 10/23/2011 00:14   Final    Colony Count NO GROWTH   Final    Culture NO GROWTH   Final    Report Status 10/24/2011 FINAL   Final   CULTURE, BLOOD (ROUTINE X 2)     Status: Normal   Collection Time   10/22/11  9:40 PM      Component Value Range Status Comment   Specimen Description BLOOD RIGHT HAND   Final    Special Requests BOTTLES DRAWN AEROBIC AND ANAEROBIC 10CC   Final    Culture  Setup Time 10/23/2011 04:30   Final    Culture     Final    Value: STAPHYLOCOCCUS SPECIES (COAGULASE NEGATIVE)     Note: SUSCEPTIBILITIES PERFORMED ON PREVIOUS CULTURE WITHIN THE LAST 5 DAYS.     Note: Gram Stain Report Called to,Read Back By and Verified With: [facility] @ 10:44PM 10/23/11 BY THOMI.   Report Status 10/25/2011 FINAL   Final   CULTURE, BLOOD (ROUTINE X 2)     Status: Normal   Collection Time   10/22/11 10:00 PM      Component Value Range Status Comment   Specimen Description BLOOD RIGHT HAND   Final    Special Requests BOTTLES DRAWN AEROBIC AND ANAEROBIC 10CC   Final    Culture  Setup Time 10/23/2011 04:30   Final    Culture     Final    Value: STAPHYLOCOCCUS SPECIES (COAGULASE NEGATIVE)     Note: RIFAMPIN AND GENTAMICIN SHOULD  NOT BE USED AS SINGLE DRUGS FOR TREATMENT OF STAPH INFECTIONS.     13 Note: Gram Stain Report Called to,Read Back By and Verified With: RN [facility] AT 10:44PM 9 2013 BY THOMI   Report Status 10/25/2011 FINAL   Final    Organism ID, Bacteria STAPHYLOCOCCUS SPECIES (COAGULASE NEGATIVE)   Final   MRSA PCR SCREENING     Status: Abnormal   Collection Time   10/23/11  5:50 AM      Component Value Range Status Comment   MRSA by PCR POSITIVE (*) NEGATIVE Final   CULTURE, BLOOD (ROUTINE X 2)     Status: Normal (Preliminary result)   Collection Time   10/25/11  5:10 PM      Component Value Range Status Comment   Specimen Description BLOOD LEFT HAND   Final    Special Requests BOTTLES DRAWN AEROBIC ONLY 10CC   Final    Culture  Setup Time 10/25/2011 23:06   Final    Culture     Final    Value:  BLOOD CULTURE RECEIVED NO GROWTH TO DATE CULTURE WILL BE HELD FOR 5 DAYS BEFORE ISSUING A FINAL NEGATIVE REPORT   Report Status PENDING   Incomplete   CULTURE, BLOOD (ROUTINE X 2)     Status: Normal (Preliminary result)   Collection Time   10/25/11  8:30 PM      Component Value Range Status Comment   Specimen Description BLOOD LEFT HAND   Final    Special Requests BOTTLES DRAWN AEROBIC ONLY 3CC   Final    Culture  Setup Time 10/26/2011 14:17   Final    Culture     Final    Value:        BLOOD CULTURE RECEIVED NO GROWTH TO DATE CULTURE WILL BE HELD FOR 5 DAYS BEFORE ISSUING A FINAL NEGATIVE REPORT   Report Status PENDING   Incomplete     Medical History: Past Medical History  Diagnosis Date  . Cardiomyopathy     EF 20-25% by echo 01/2010 with severe LVH felt secondary to substance abuse (no ischemic workup)  . 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 Diabetes mellitus   . HTN (hypertension)   . Polysubstance abuse     Reported hx of cocaine, THC, heroin, EtOH abuse  . Noncompliance   . Acute renal failure      Assessment: 56 yo male admitted from SNF due to AMS and hypoxemia, positive for PNA.  Cultures reveal CNS with sensitivities listed below.  Ancef should cover both S pneumo PNA as well as CNS. On ancef per pharmacy. MRI of stump 9/17 shows no osteo but possible cellulitis.  9/13 vanc >>9/13, resumed 9/14>>9/15 9/13 zosyn>>9/15 9/13 levofloxacin>>9/15 9/15 ancef >>  9/12: bld x 2: GPC clusters; CNS, R-clindamycin, erythromycin, levofloxacin, PCN; SENS to gent, oxacillin, rifampin, tetracycline, vancomycin 9/12: urine: neg 9/13: MRSA PCR positive 9/13: strep pneumo Urinary ag positive 9/15: bld x 2 NGTD  Goal of Therapy:  Irradication of infection  Plan:  - Continue Ancef 2g IV q8h and follow up length of treatment.  - Follow up SCr, UOP, cultures, clinical course and adjust as clinically indicated  Thank you,  Brett Fairy, PharmD

## 2011-10-28 NOTE — Progress Notes (Signed)
Speech Language Pathology Dysphagia Treatment Patient Details Name: Jeremy Howell MRN: [id number] DOB: 12-Dec-1955 Today's Date: 10/28/2011 Time: 8295-6213 SLP Time Calculation (min): 35 min  Assessment / Plan / Recommendation Clinical Impression  Pt seen for dysphagia tx, pt with significant aphasia which impairs his ability to communicate.  Even with aphasia, pt has made it clear to his displeasure of his current diet, stating "here bad, there good".  Intake documented as 25% and he has slight fever even with diet modification.  RN reports tolerance of pills with nectar.  Observed pt to consume crackers- x3, applesauce x2 boluses, nectar juice and thin water.  Intermittent cough noted with intake - also productive cough noted when pt used flutter valve.  Suspect cervical esophageal issues impacting his swallow ability and aspiration risk.  Pt appears to be indicating that he had significant productive coughing with and without po intake.  As intake is poor and cough may be due to pna and/or esophageal issue, rec consider advancement in diet to dys2/nectar.  Productive cough to viscous secretions noted at end of tx session - no food or drink expectorated.    SLP will follow up to assess tolerance.  Of note, SLP phoned SNF SLP and left message requesting call back to determine premorbid function, did not receive call as of note documentation.  Even with modification to diet, this pt's aspiration risk will likely be chronic due to his level of suspected multifactorial neuromuscular dysphagia.    Rec consider advancement to dys2/nectar with strict precautions and modify if pt does not tolerate.  Pt eats slowly from this SLPs observation.  Further recommend pt has followup SLP at SNF.     Diet Recommendation  Initiate / Change Diet: Dysphagia 2 (fine chop);Nectar-thick liquid    SLP Plan Continue with current plan of care   Pertinent Vitals/Pain Febrile, low grade, intake 25%, on oxygen     Swallowing Goals  SLP Swallowing Goals Swallow Study Goal #1 - Progress: Not Met Swallow Study Goal #2 - Progress: Progressing toward goal  General Temperature Spikes Noted: Yes (low grade 99.1) Respiratory Status: Supplemental O2 delivered via (comment) Behavior/Cognition: Alert;Other (comment) (pt has aphasia-difficult to communicate with him) Oral Cavity - Dentition:  (few lower teeth, decayed, no upper) Patient Positioning: Upright in bed  Oral Cavity - Oral Hygiene   no toothbrush in room, monitor tech notified  Dysphagia Treatment Treatment focused on: Skilled observation of diet tolerance;Upgraded PO texture trials;Patient/family/caregiver education Treatment Methods/Modalities: Skilled observation;Differential diagnosis Type of PO's observed: Regular;Thin liquids;Nectar-thick liquids;Dysphagia 1 (puree) Feeding: Able to feed self Liquids provided via: Cup;Straw Pharyngeal Phase Signs & Symptoms: Immediate cough;Suspected delayed swallow initiation (cough reported with and without po intake ) Type of cueing: Verbal Amount of cueing: Moderate   GO    Donavan Burnet, MS [facility] SLP [phone]

## 2011-10-28 NOTE — Progress Notes (Signed)
[facility] PROGRESS NOTE  Chapman Matteucci WUJ:[id number] DOB: 1955/06/10 DOA: 10/22/2011 PCP: Dorrene German, MD  Assessment/Plan: *Acute respiratory failure with hypoxia 2/2 HCAP (healthcare-associated pneumonia)/Streptococcal pneumonia  *95-98% on 2liters *Urinary antigen positive for streptococcal pneumonia  *now on Ancef  Neutropenia associated with infection  *this has resolved  Bacteremia- Coag neg Staph  -MRI of the stump suggests soft tissue edema in the posterior medial aspect concerning for focal area of cellulitis -As per ID recommendations, we'll plan for 6 weeks of intravenous cefazolin Diabetes mellitus  *CBGs are well controlled  *Continue Lantus insulin and sliding scale insulin  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  PVD (peripheral vascular disease)  *Recent AKA on the left  *Has undergone outpatient arteriogram that revealed need for revascularization of the right lower extremity when patient stable.  History of CVA (cerebrovascular accident)/Aphasia/Dysphagia  *Diet per ST recommendations.  HTN (hypertension)  *Initially blood pressure quite soft after admitted to ICU but is slowly rebounding  *Norvasc resumed -Continue carvedilol    Procedures/Studies: Ct Head Wo Contrast  10/23/2011  *RADIOLOGY REPORT*  Clinical Data: Altered mental status.  Decreased oxygen saturation. Loss of consciousness.  CT HEAD WITHOUT CONTRAST  Technique:  Contiguous axial images were obtained from the base of the skull through the vertex without contrast.  Comparison: 07/21/2011  Findings: Diffuse cerebral atrophy.  Low attenuation changes in the deep white matter consistent with central atrophy.  Large area of encephalomalacia involving the left temporal parietal region. Smaller area of encephalomalacia in the right posterior parietal region.  These are stable since previous  study and are consistent with old infarcts.  No acute sulci effacement.  No mass effect or midline shift.  Mild ventricular dilatation consistent with central atrophy.  No abnormal extra-axial fluid collections.  Basal cisterns are not effaced.  No evidence of acute intracranial hemorrhage.  Retention cysts in the right maxillary antrum and left frontal sinus.  Mastoid air cells are not opacified.  No depressed skull fractures.  IMPRESSION: No acute intracranial abnormalities.  Old left temporoparietal and right parietal infarcts.   Original Report Authenticated By: Marlon Pel, M.D.    Ct Angio Chest Pe W/cm &/or Wo Cm  10/23/2011  *RADIOLOGY REPORT*  Clinical Data: Altered mental status.  Hypoxia.  CT ANGIOGRAPHY CHEST  Technique:  Multidetector CT imaging of the chest using the standard protocol during bolus administration of intravenous contrast. Multiplanar reconstructed images including MIPs were obtained and reviewed to evaluate the vascular anatomy.  Contrast: 80mL OMNIPAQUE IOHEXOL 350 MG/ML SOLN  Comparison: None.  Findings: Technically adequate study with good opacification of the central and segmental pulmonary arteries.  No focal filling defects.  No evidence of significant pulmonary embolus.  Mild cardiac enlargement.  Coronary artery calcification.  Normal caliber thoracic aorta.  No dissection.  No significant lymphadenopathy in the chest.  Esophagus is mostly decompressed. No pleural effusions.  Visualized portions of the upper abdominal organs are grossly unremarkable.  There is consolidation in both lung bases with patchy airspace infiltrates in the remainder the lungs.  Changes likely to represent bilateral pneumonia. Peribronchial thickening suggesting bronchitis.  Airways appear patent.  No pneumothorax.  Normal alignment of the thoracic spine.  IMPRESSION: No evidence of significant pulmonary embolus.  Bilateral lower lobe consolidation and patchy air space disease throughout  consistent with pneumonia.   Original Report Authenticated By: Marlon Pel, M.D.    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 Bacteremia.  Severe left femur pain.  MRI OF THE LEFT FEMUR WITHOUT CONTRAST  Technique:  Multiplanar, multisequence MR imaging of the lower left extremity was performed without contrast because the patient refused to continue with the exam.  Comparison: None.  Findings: There is no definable abscess.  There is no osteomyelitis.  There is a small area of soft tissue edema at the posterior medial aspect of the stump which could represent a small focal area of cellulitis.  This only measures 12 mm in diameter.  There is diffuse edema in the muscles of the thigh, typical for post amputation changes.  The patient has severe arthritic changes of the left hip.  No hip effusion.  The visualized portion of the pelvis appears normal. Small nonspecific effusion at the right hip.  IMPRESSION: Possible small focal area of cellulitis of the posterior medial aspect of the stump of the left thigh.  No abscess or osteomyelitis.   Original Report Authenticated By: Gwynn Burly, M.D.    Dg Chest Port 1 View  10/22/2011  *RADIOLOGY REPORT*  Clinical Data: 56 year old male altered mental status.  PORTABLE CHEST - 1 VIEW  Comparison: 08/14/2011 and earlier.  Findings: Semi upright AP portable view 2110 hours.  Chronic cardiomegaly. Other mediastinal contours are within normal limits. Visualized tracheal air column is within normal limits. Chronically low lung volumes.  No pneumothorax, pulmonary edema, or pleural effusion.  No acute pulmonary opacity.  IMPRESSION: No acute cardiopulmonary abnormality.   Original Report Authenticated By: Harley Hallmark, M.D.    Dg Abd Portable 2v  10/23/2011  *RADIOLOGY REPORT*  Clinical Data: Rigid abdomen.  PORTABLE ABDOMEN - 2 VIEW  Comparison: Pelvis 08/14/2011.  Findings:  Residual contrast material in the urinary tract.  Foley catheter in the bladder.  Scattered gas and stool in the colon.  No small or large bowel distension.  No free intra-abdominal air.  No abnormal air fluid levels.  Postoperative and post-traumatic deformities of the left hip.  IMPRESSION: Nonobstructive bowel gas pattern.  No free air.   Original Report Authenticated By: Marlon Pel, M.D.        Antibiotics:  Cefazolin September 15>>>  Levaquin September 13>>> September 15   azithromycin September 14>>> September 16   Code Status: Full Family Communication: Pt at bedside Disposition Plan: Home when medically stable  Subjective:   Objective: Filed Vitals:   10/28/11 0158 10/28/11 0525 10/28/11 1400 10/28/11 1837  BP: 149/83 146/94 119/74 123/73  Pulse: 96  84 89  Temp: 100.3 F (37.9 C) 99.1 F (37.3 C) 98.3 F (36.8 C) 100 F (37.8 C)  TempSrc: Oral Oral Axillary Oral  Resp: 18 19 20 18   Height:      Weight:      SpO2: 97% 98% 97% 97%    Intake/Output Summary (Last 24 hours) at 10/28/11 1942 Last data filed at 10/28/11 1403  Gross per 24 hour  Intake    470 ml  Output      0 ml  Net    470 ml   Weight change:  Exam:   General:  Pt is alert, follows commands appropriately, not in acute distress  HEENT: No icterus, No thrush,  Superior/AT  Cardiovascular: Regular rate and rhythm, S1/S2, no rubs, no gallops  Respiratory: Clear to auscultation bilaterally, no wheezing, no crackles, no rhonchi  Abdomen: Soft, non tender, non distended,  bowel sounds present, no guarding  Extremities: Left stump --No edema, No lymphangitis, No petechiae, No rashes, no synovitis  Data Reviewed: Basic Metabolic Panel:  Lab 10/27/11 9562 10/26/11 0630 10/25/11 0655 10/24/11 0600 10/23/11 0425  NA 139 140 139 136 139  K 3.5 3.6 3.4* 3.9 4.8  CL 102 103 103 105 106  CO2 26 27 26 20 24   GLUCOSE 92 89 90 90 108*  BUN 10 10 11 20  24*  CREATININE 0.80 0.81 0.89 0.93 1.07    CALCIUM 9.6 9.6 9.6 9.3 9.4  MG -- -- -- -- 1.7  PHOS -- -- -- -- 3.8   Liver Function Tests:  Lab 10/24/11 0600 10/22/11 2208  AST 18 15  ALT 14 20  ALKPHOS 57 79  BILITOT 0.5 0.1*  PROT 6.4 7.4  ALBUMIN 2.5* 3.2*    Lab 10/24/11 1005  LIPASE 29  AMYLASE --   No results found for this basename: AMMONIA:5 in the last 168 hours CBC:  Lab 10/27/11 0540 10/25/11 0655 10/24/11 0600 10/23/11 0425 10/22/11 2243 10/22/11 2208  WBC 6.7 8.0 7.3 1.8* -- 2.7*  NEUTROABS -- -- 5.9 -- -- 1.7  HGB 11.6* 11.8* 11.8* 13.5 13.9 --  HCT 35.5* 34.0* 36.0* 41.7 41.0 --  MCV 78.9 79.3 81.8 82.9 -- 81.8  PLT 276 250 222 262 -- 303   Cardiac Enzymes:  Lab 10/24/11 1005  CKTOTAL 425*  CKMB --  CKMBINDEX --  TROPONINI --   BNP: No components found with this basename: POCBNP:5 CBG:  Lab 10/28/11 1636 10/28/11 1254 10/28/11 0938 10/27/11 2147 10/27/11 1756  GLUCAP 86 123* 130* 80 109*    Recent Results (from the past 240 hour(s))  URINE CULTURE     Status: Normal   Collection Time   10/22/11  9:35 PM      Component Value Range Status Comment   Specimen Description URINE, CATHETERIZED   Final    Special Requests NONE   Final    Culture  Setup Time 10/23/2011 00:14   Final    Colony Count NO GROWTH   Final    Culture NO GROWTH   Final    Report Status 10/24/2011 FINAL   Final   CULTURE, BLOOD (ROUTINE X 2)     Status: Normal   Collection Time   10/22/11  9:40 PM      Component Value Range Status Comment   Specimen Description BLOOD RIGHT HAND   Final    Special Requests BOTTLES DRAWN AEROBIC AND ANAEROBIC 10CC   Final    Culture  Setup Time 10/23/2011 04:30   Final    Culture     Final    Value: STAPHYLOCOCCUS SPECIES (COAGULASE NEGATIVE)     Note: SUSCEPTIBILITIES PERFORMED ON PREVIOUS CULTURE WITHIN THE LAST 5 DAYS.     Note: Gram Stain Report Called to,Read Back By and Verified With: [facility] @ 10:44PM 10/23/11 BY THOMI.   Report Status 10/25/2011 FINAL   Final   CULTURE,  BLOOD (ROUTINE X 2)     Status: Normal   Collection Time   10/22/11 10:00 PM      Component Value Range Status Comment   Specimen Description BLOOD RIGHT HAND   Final    Special Requests BOTTLES DRAWN AEROBIC AND ANAEROBIC 10CC   Final    Culture  Setup Time 10/23/2011 04:30   Final    Culture     Final    Value: STAPHYLOCOCCUS SPECIES (COAGULASE NEGATIVE)  Note: RIFAMPIN AND GENTAMICIN SHOULD NOT BE USED AS SINGLE DRUGS FOR TREATMENT OF STAPH INFECTIONS.     13 Note: Gram Stain Report Called to,Read Back By and Verified With: RN [facility] AT 10:44PM 9 2013 BY THOMI   Report Status 10/25/2011 FINAL   Final    Organism ID, Bacteria STAPHYLOCOCCUS SPECIES (COAGULASE NEGATIVE)   Final   MRSA PCR SCREENING     Status: Abnormal   Collection Time   10/23/11  5:50 AM      Component Value Range Status Comment   MRSA by PCR POSITIVE (*) NEGATIVE Final   CULTURE, BLOOD (ROUTINE X 2)     Status: Normal (Preliminary result)   Collection Time   10/25/11  5:10 PM      Component Value Range Status Comment   Specimen Description BLOOD LEFT HAND   Final    Special Requests BOTTLES DRAWN AEROBIC ONLY 10CC   Final    Culture  Setup Time 10/25/2011 23:06   Final    Culture     Final    Value:        BLOOD CULTURE RECEIVED NO GROWTH TO DATE CULTURE WILL BE HELD FOR 5 DAYS BEFORE ISSUING A FINAL NEGATIVE REPORT   Report Status PENDING   Incomplete   CULTURE, BLOOD (ROUTINE X 2)     Status: Normal (Preliminary result)   Collection Time   10/25/11  8:30 PM      Component Value Range Status Comment   Specimen Description BLOOD LEFT HAND   Final    Special Requests BOTTLES DRAWN AEROBIC ONLY 3CC   Final    Culture  Setup Time 10/26/2011 14:17   Final    Culture     Final    Value:        BLOOD CULTURE RECEIVED NO GROWTH TO DATE CULTURE WILL BE HELD FOR 5 DAYS BEFORE ISSUING A FINAL NEGATIVE REPORT   Report Status PENDING   Incomplete      Scheduled Meds:   . amLODipine  10 mg Oral Daily  . aspirin   325 mg Oral Daily  . carvedilol  25 mg Oral BID WC  .  ceFAZolin (ANCEF) IV  2 g Intravenous Q8H  . Chlorhexidine Gluconate Cloth  6 each Topical Q0600  . enoxaparin (LOVENOX) injection  40 mg Subcutaneous Q24H  . folic acid  1 mg Oral Daily  . insulin aspart  0-5 Units Subcutaneous QHS  . insulin aspart  0-9 Units Subcutaneous TID WC  . insulin glargine  5 Units Subcutaneous QHS  . mupirocin ointment  1 application Nasal BID  . nicotine  21 mg Transdermal Q24H  . simvastatin  10 mg Oral q1800   Continuous Infusions:    Jakyrah Holladay, DO  [facility] Pager [phone]  If 7PM-7AM, please contact night-coverage [email or web address] Password [email or web address] 10/28/2011, 7:42 PM   LOS: 6 days

## 2011-10-29 LAB — GLUCOSE, CAPILLARY: Glucose-Capillary: 97 mg/dL (ref 70–99)

## 2011-10-29 LAB — BASIC METABOLIC PANEL
Calcium: 9 mg/dL (ref 8.4–10.5)
Creatinine, Ser: 0.74 mg/dL (ref 0.50–1.35)
GFR calc non Af Amer: >90 mL/min (ref >90)
Sodium: 138 mEq/L (ref 135–145)

## 2011-10-29 MED ORDER — HEPARIN SOD (PORK) LOCK FLUSH 100 UNIT/ML IV SOLN
250.0000 [IU] | INTRAVENOUS | Status: AC | PRN
  Administered 2011-10-29: 500 [IU]

## 2011-10-29 MED ORDER — CEFAZOLIN SODIUM-DEXTROSE 2-3 GM-% IV SOLR: 2.0000 g | Freq: Three times a day (TID) | INTRAVENOUS | Status: DC

## 2011-10-29 MED ORDER — STARCH (THICKENING) PO POWD
6.0000 g | ORAL | Status: DC | PRN
Start: 2011-10-29 — End: 2011-12-23

## 2011-10-29 NOTE — Progress Notes (Signed)
Clinical Social Work  CSW faxed [facility] to [facility]. SNF agreeable to accept patient today. CSW informed patient and brother Alinda Money) of dc and all were agreeable. CSW prepared dc packet. CSW coordinated transportation via [place]. CSW is signing off.  [place], [place] [phone]

## 2011-10-29 NOTE — Progress Notes (Signed)
Patient discharged to [facility]. Report called to RN at [facility].  Patient transported by ambulance. Vital signs stable, no complaints of pain.

## 2011-10-29 NOTE — Discharge Summary (Signed)
Physician Discharge Summary  Jeremy Howell BJY:[id number] DOB: Jan 20, 1956 DOA: 10/22/2011  PCP: Dorrene German, MD  Admit date: 10/22/2011 Discharge date: 10/29/2011  Recommendations for Outpatient Follow-up:  1. Pt will need to follow up with PCP in 2-3 weeks post discharge   *Acute respiratory failure with hypoxia 2/2 HCAP (healthcare-associated pneumonia)/Streptococcal pneumonia  *95-98% on RA  *Urinary antigen positive for streptococcal pneumonia  *now on Ancef IV Neutropenia associated with infection  *this has resolved  Bacteremia- Coag neg Staph  -MRI of the stump suggests soft tissue edema in the posterior medial aspect concerning for focal area of cellulitis  -As per ID recommendations, we'll plan for 6 weeks of intravenous cefazolin  -Cefazolin 2 g IV every 8 hours with a stop date of 12/06/2011 -remove PICC line when finished with last dose antibiotics Diabetes mellitus  *CBGs are well controlled  *Continue Lantus insulin 5 units at bedtime  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  PVD (peripheral vascular disease)  *Recent AKA on the left  *Has undergone outpatient arteriogram that revealed need for revascularization of the right lower extremity when patient stable.  History of CVA (cerebrovascular accident)/Aphasia/Dysphagia  *Diet per ST recommendations--dysphagia 1 diet;  Nectar thick liquids HTN (hypertension)  *Initially blood pressure quite soft after admitted to ICU but is slowly rebounding  *Norvasc resumed  -Continue carvedilol  Discharge Condition: stable  Disposition: SNF  Diet:Dysphagia I diet, nectar thick liquids Wt Readings from Last 3 Encounters:  10/29/11 53.5 kg (117 lb 15.1 oz)  10/16/11 51.8 kg (114 lb 3.2 oz)  10/16/11 51.8 kg (114 lb 3.2 oz)    History of present illness:  56 year old male with a prior CVA, cardiomyopathy, and recent  left AKA in a nursing home who came to the ED with main concern of altered mental status and was markedly hypoxemia on arrival requiring nonrebreather. Physician at the time  unable to obtain specific details about pre existing concerns due to pt's altered mental status and expressive aphasia and most of this information was obtained from SNF records where pt has been residing. Per records there was no known fever, chills, but productive cough was noted.   Hospital Course:  The patient was admitted to the step down unit and started on intravenous antibiotics. Initially, the patient was started on vancomycin, Levaquin and Zosyn. Blood cultures were obtained and revealed coagulase-negative Staphylococcus. The patient was subsequently switched to cefazolin when it was determined that the susceptibilities of the isolate revealed a sensitivity to oxacillin. A urine Streptococcus pneumoniae antigen was found to be positive. Initially, the patient was on azithromycin. This was also discontinued in view of cefazolin. Repeat blood cultures on September 15 were negative. Initial chest x-rays did not reveal any infiltrates. However, a CT angiogram of the chest which was negative for pulmonary embolus revealed bibasilar consolidations. CT of the head revealed old temporal parietal infarct on the right. The patient's clinical condition gradually improved with improvement of his hypoxemia as well as confusion. There is also a question of occult narcotic use as the patient also improved his mentation with Narcan. The patient's fever also improved. Speech evaluation was obtained. Most recently on 10/27/2011, they felt the patient was appropriate for a dysphagia 1 diet with nectar thickened liquids. The patient was subsequently moved to the regular medical floor. His oxygen was gradually weaned. Currently the patient has an oxygen saturation of 94-98% on room air. The  patient's neutropenia gradually improved. This was thought to  be due to sepsis. Because of the patient's bacteremia, infectious disease was asked to see the patient. They recommended performing an MRI on the patient's stump/above-the-knee amputation site. The MRI revealed focal soft tissue edema on the posterior medial aspect of the stump concerning for cellulitis. Infectious disease recommended that if there is any evidence of infection on his MRI, they would recommend 4-6 weeks of intravenous antibiotics. On physical exam, the stump did not reveal any drainage, open wounds, crepitance, or necrosis. Physical therapy was consulted. The patient participated with therapy. It was recommended the patient go to a skilled nursing facility. The patient's Accu-Cheks were well controlled. His Lantus 5 units at bedtime will be continued. The patient's congestive heart failure remained stable and well compensated during the admission. Regarding his hypertension, his initial blood pressures were marginally low, but as the patient clinically improved his antihypertensive medications were reintroduced  Consultants: Pulmonary/critical care Infectious disease  Discharge Exam: Filed Vitals:   10/29/11 0624  BP: 133/74  Pulse: 87  Temp: 97.8 F (36.6 C)  Resp: 18   Filed Vitals:   10/28/11 1837 10/28/11 2150 10/29/11 0500 10/29/11 0624  BP: 123/73 123/76  133/74  Pulse: 89 85  87  Temp: 100 F (37.8 C) 98.4 F (36.9 C)  97.8 F (36.6 C)  TempSrc: Oral Oral  Oral  Resp: 18 18  18   Height:      Weight:   53.5 kg (117 lb 15.1 oz)   SpO2: 97% 98%  94%   General: A&O x 3, NAD, pleasant, cooperative Cardiovascular: RRR, no rub, no gallop, no S3 Respiratory: CTAB, no wheeze, no rhonchi Abdomen:soft, nontender, nondistended, positive bowel sounds Extremities: Left stump --No edema, No lymphangitis, No petechiae, No rashes, no synovitis   Discharge Instructions      Discharge Orders    Future Appointments: Provider: Department: Dept Phone: Center:   12/09/2011  8:45 AM Lewayne Bunting, MD [facility] [phone] [facility]       Medication List     As of 10/29/2011  8:08 AM    ASK your doctor about these medications         amLODipine 10 MG tablet   Commonly known as: NORVASC   Take 10 mg by mouth daily.      aspirin 325 MG tablet   Take 325 mg by mouth daily.      carvedilol 25 MG tablet   Commonly known as: COREG   Take 25 mg by mouth 2 (two) times daily with a meal.      folic acid 1 MG tablet   Commonly known as: FOLVITE   Take 1 mg by mouth daily.      insulin aspart 100 UNIT/ML injection   Commonly known as: novoLOG   Inject 3 Units into the skin 4 (four) times daily - after meals and at bedtime. If CBG > 150.      insulin glargine 100 UNIT/ML injection   Commonly known as: LANTUS   Inject 5 Units into the skin at bedtime.      magnesium hydroxide 400 MG/5ML suspension   Commonly known as: MILK OF MAGNESIA   Take 30 mLs by mouth daily as needed. For constipation.      nicotine 21 mg/24hr patch   Commonly known as: NICODERM CQ - dosed in mg/24 hours   Place 1 patch onto the skin daily.      ofloxacin 0.3 %  ophthalmic solution   Commonly known as: OCUFLOX   Place 2 drops into both eyes 4 (four) times daily.          The results of significant diagnostics from this hospitalization (including imaging, microbiology, ancillary and laboratory) are listed below for reference.    Significant Diagnostic Studies: Ct Head Wo Contrast  10/23/2011  *RADIOLOGY REPORT*  Clinical Data: Altered mental status.  Decreased oxygen saturation. Loss of consciousness.  CT HEAD WITHOUT CONTRAST  Technique:  Contiguous axial images were obtained from the base of the skull through the vertex without contrast.  Comparison: 07/21/2011  Findings: Diffuse cerebral atrophy.  Low attenuation changes in the deep white matter consistent with central atrophy.  Large area of encephalomalacia involving the left temporal parietal region.  Smaller area of encephalomalacia in the right posterior parietal region.  These are stable since previous study and are consistent with old infarcts.  No acute sulci effacement.  No mass effect or midline shift.  Mild ventricular dilatation consistent with central atrophy.  No abnormal extra-axial fluid collections.  Basal cisterns are not effaced.  No evidence of acute intracranial hemorrhage.  Retention cysts in the right maxillary antrum and left frontal sinus.  Mastoid air cells are not opacified.  No depressed skull fractures.  IMPRESSION: No acute intracranial abnormalities.  Old left temporoparietal and right parietal infarcts.   Original Report Authenticated By: Marlon Pel, M.D.    Ct Angio Chest Pe W/cm &/or Wo Cm  10/23/2011  *RADIOLOGY REPORT*  Clinical Data: Altered mental status.  Hypoxia.  CT ANGIOGRAPHY CHEST  Technique:  Multidetector CT imaging of the chest using the standard protocol during bolus administration of intravenous contrast. Multiplanar reconstructed images including MIPs were obtained and reviewed to evaluate the vascular anatomy.  Contrast: 80mL OMNIPAQUE IOHEXOL 350 MG/ML SOLN  Comparison: None.  Findings: Technically adequate study with good opacification of the central and segmental pulmonary arteries.  No focal filling defects.  No evidence of significant pulmonary embolus.  Mild cardiac enlargement.  Coronary artery calcification.  Normal caliber thoracic aorta.  No dissection.  No significant lymphadenopathy in the chest.  Esophagus is mostly decompressed. No pleural effusions.  Visualized portions of the upper abdominal organs are grossly unremarkable.  There is consolidation in both lung bases with patchy airspace infiltrates in the remainder the lungs.  Changes likely to represent bilateral pneumonia. Peribronchial thickening suggesting bronchitis.  Airways appear patent.  No pneumothorax.  Normal alignment of the thoracic spine.  IMPRESSION: No evidence of  significant pulmonary embolus.  Bilateral lower lobe consolidation and patchy air space disease throughout consistent with pneumonia.   Original Report Authenticated By: Marlon Pel, M.D.    Mr Femur Left Wo Contrast  10/28/2011  *RADIOLOGY REPORT*  Clinical Data: Wound infection after an above-knee amputation. Bacteremia.  Severe left femur pain.  MRI OF THE LEFT FEMUR WITHOUT CONTRAST  Technique:  Multiplanar, multisequence MR imaging of the lower left extremity was performed without contrast because the patient refused to continue with the exam.  Comparison: None.  Findings: There is no definable abscess.  There is no osteomyelitis.  There is a small area of soft tissue edema at the posterior medial aspect of the stump which could represent a small focal area of cellulitis.  This only measures 12 mm in diameter.  There is diffuse edema in the muscles of the thigh, typical for post amputation changes.  The patient has severe arthritic changes of the left hip.  No hip  effusion.  The visualized portion of the pelvis appears normal. Small nonspecific effusion at the right hip.  IMPRESSION: Possible small focal area of cellulitis of the posterior medial aspect of the stump of the left thigh.  No abscess or osteomyelitis.   Original Report Authenticated By: Gwynn Burly, M.D.    Dg Chest Port 1 View  10/22/2011  *RADIOLOGY REPORT*  Clinical Data: 56 year old male altered mental status.  PORTABLE CHEST - 1 VIEW  Comparison: 08/14/2011 and earlier.  Findings: Semi upright AP portable view 2110 hours.  Chronic cardiomegaly. Other mediastinal contours are within normal limits. Visualized tracheal air column is within normal limits. Chronically low lung volumes.  No pneumothorax, pulmonary edema, or pleural effusion.  No acute pulmonary opacity.  IMPRESSION: No acute cardiopulmonary abnormality.   Original Report Authenticated By: Harley Hallmark, M.D.    Dg Abd Portable 2v  10/23/2011  *RADIOLOGY REPORT*   Clinical Data: Rigid abdomen.  PORTABLE ABDOMEN - 2 VIEW  Comparison: Pelvis 08/14/2011.  Findings: Residual contrast material in the urinary tract.  Foley catheter in the bladder.  Scattered gas and stool in the colon.  No small or large bowel distension.  No free intra-abdominal air.  No abnormal air fluid levels.  Postoperative and post-traumatic deformities of the left hip.  IMPRESSION: Nonobstructive bowel gas pattern.  No free air.   Original Report Authenticated By: Marlon Pel, M.D.      Microbiology: Recent Results (from the past 240 hour(s))  URINE CULTURE     Status: Normal   Collection Time   10/22/11  9:35 PM      Component Value Range Status Comment   Specimen Description URINE, CATHETERIZED   Final    Special Requests NONE   Final    Culture  Setup Time 10/23/2011 00:14   Final    Colony Count NO GROWTH   Final    Culture NO GROWTH   Final    Report Status 10/24/2011 FINAL   Final   CULTURE, BLOOD (ROUTINE X 2)     Status: Normal   Collection Time   10/22/11  9:40 PM      Component Value Range Status Comment   Specimen Description BLOOD RIGHT HAND   Final    Special Requests BOTTLES DRAWN AEROBIC AND ANAEROBIC 10CC   Final    Culture  Setup Time 10/23/2011 04:30   Final    Culture     Final    Value: STAPHYLOCOCCUS SPECIES (COAGULASE NEGATIVE)     Note: SUSCEPTIBILITIES PERFORMED ON PREVIOUS CULTURE WITHIN THE LAST 5 DAYS.     Note: Gram Stain Report Called to,Read Back By and Verified With: [facility] @ 10:44PM 10/23/11 BY THOMI.   Report Status 10/25/2011 FINAL   Final   CULTURE, BLOOD (ROUTINE X 2)     Status: Normal   Collection Time   10/22/11 10:00 PM      Component Value Range Status Comment   Specimen Description BLOOD RIGHT HAND   Final    Special Requests BOTTLES DRAWN AEROBIC AND ANAEROBIC 10CC   Final    Culture  Setup Time 10/23/2011 04:30   Final    Culture     Final    Value: STAPHYLOCOCCUS SPECIES (COAGULASE NEGATIVE)     Note: RIFAMPIN AND  GENTAMICIN SHOULD NOT BE USED AS SINGLE DRUGS FOR TREATMENT OF STAPH INFECTIONS.     13 Note: Gram Stain Report Called to,Read Back By and Verified With: RN [facility]  AT 10:44PM 9 2013 BY THOMI   Report Status 10/25/2011 FINAL   Final    Organism ID, Bacteria STAPHYLOCOCCUS SPECIES (COAGULASE NEGATIVE)   Final   MRSA PCR SCREENING     Status: Abnormal   Collection Time   10/23/11  5:50 AM      Component Value Range Status Comment   MRSA by PCR POSITIVE (*) NEGATIVE Final   CULTURE, BLOOD (ROUTINE X 2)     Status: Normal (Preliminary result)   Collection Time   10/25/11  5:10 PM      Component Value Range Status Comment   Specimen Description BLOOD LEFT HAND   Final    Special Requests BOTTLES DRAWN AEROBIC ONLY 10CC   Final    Culture  Setup Time 10/25/2011 23:06   Final    Culture     Final    Value:        BLOOD CULTURE RECEIVED NO GROWTH TO DATE CULTURE WILL BE HELD FOR 5 DAYS BEFORE ISSUING A FINAL NEGATIVE REPORT   Report Status PENDING   Incomplete   CULTURE, BLOOD (ROUTINE X 2)     Status: Normal (Preliminary result)   Collection Time   10/25/11  8:30 PM      Component Value Range Status Comment   Specimen Description BLOOD LEFT HAND   Final    Special Requests BOTTLES DRAWN AEROBIC ONLY 3CC   Final    Culture  Setup Time 10/26/2011 14:17   Final    Culture     Final    Value:        BLOOD CULTURE RECEIVED NO GROWTH TO DATE CULTURE WILL BE HELD FOR 5 DAYS BEFORE ISSUING A FINAL NEGATIVE REPORT   Report Status PENDING   Incomplete      Labs: Basic Metabolic Panel:  Lab 10/29/11 8119 10/27/11 0540 10/26/11 0630 10/25/11 0655 10/24/11 0600 10/23/11 0425  NA 138 139 140 139 136 --  K 3.5 3.5 -- -- -- --  CL 102 102 103 103 105 --  CO2 29 26 27 26 20  --  GLUCOSE 103* 92 89 90 90 --  BUN 9 10 10 11 20  --  CREATININE 0.74 0.80 0.81 0.89 0.93 --  CALCIUM 9.0 9.6 9.6 9.6 9.3 --  MG -- -- -- -- -- 1.7  PHOS -- -- -- -- -- 3.8   Liver Function Tests:  Lab 10/24/11 0600  10/22/11 2208  AST 18 15  ALT 14 20  ALKPHOS 57 79  BILITOT 0.5 0.1*  PROT 6.4 7.4  ALBUMIN 2.5* 3.2*    Lab 10/24/11 1005  LIPASE 29  AMYLASE --   No results found for this basename: AMMONIA:5 in the last 168 hours CBC:  Lab 10/27/11 0540 10/25/11 0655 10/24/11 0600 10/23/11 0425 10/22/11 2243 10/22/11 2208  WBC 6.7 8.0 7.3 1.8* -- 2.7*  NEUTROABS -- -- 5.9 -- -- 1.7  HGB 11.6* 11.8* 11.8* 13.5 13.9 --  HCT 35.5* 34.0* 36.0* 41.7 41.0 --  MCV 78.9 79.3 81.8 82.9 -- 81.8  PLT 276 250 222 262 -- 303   Cardiac Enzymes:  Lab 10/24/11 1005  CKTOTAL 425*  CKMB --  CKMBINDEX --  TROPONINI --   BNP: No components found with this basename: POCBNP:5 CBG:  Lab 10/28/11 2147 10/28/11 1636 10/28/11 1254 10/28/11 0938 10/27/11 2147  GLUCAP 126* 86 123* 130* 80    Time coordinating discharge:  Greater than 30 minutes  Signed:  Kenyetta Fife, DO [facility] Pager: [phone] 10/29/2011, 8:08 AM

## 2011-10-31 LAB — CULTURE, BLOOD (ROUTINE X 2): Culture: NO GROWTH

## 2011-11-01 LAB — CULTURE, BLOOD (ROUTINE X 2)

## 2011-11-19 ENCOUNTER — Ambulatory Visit (HOSPITAL_COMMUNITY): Payer: Medicare Other | Attending: Cardiology | Admitting: Radiology

## 2011-11-19 VITALS — BP 115/65 | HR 59 | Ht 64.0 in | Wt 115.0 lb

## 2011-11-19 DIAGNOSIS — E119 Type 2 diabetes mellitus without complications: Secondary | ICD-10-CM | POA: Insufficient documentation

## 2011-11-19 DIAGNOSIS — Z01810 Encounter for preprocedural cardiovascular examination: Secondary | ICD-10-CM | POA: Insufficient documentation

## 2011-11-19 DIAGNOSIS — I10 Essential (primary) hypertension: Secondary | ICD-10-CM | POA: Insufficient documentation

## 2011-11-19 DIAGNOSIS — F17200 Nicotine dependence, unspecified, uncomplicated: Secondary | ICD-10-CM | POA: Insufficient documentation

## 2011-11-19 DIAGNOSIS — I70269 Atherosclerosis of native arteries of extremities with gangrene, unspecified extremity: Secondary | ICD-10-CM

## 2011-11-19 DIAGNOSIS — I739 Peripheral vascular disease, unspecified: Secondary | ICD-10-CM | POA: Insufficient documentation

## 2011-11-19 DIAGNOSIS — I509 Heart failure, unspecified: Secondary | ICD-10-CM | POA: Insufficient documentation

## 2011-11-19 MED ORDER — TECHNETIUM TC 99M SESTAMIBI GENERIC - CARDIOLITE
11.0000 | Freq: Once | INTRAVENOUS | Status: AC | PRN
  Administered 2011-11-19: 11 via INTRAVENOUS

## 2011-11-19 MED ORDER — TECHNETIUM TC 99M SESTAMIBI GENERIC - CARDIOLITE
33.0000 | Freq: Once | INTRAVENOUS | Status: AC | PRN
  Administered 2011-11-19: 33 via INTRAVENOUS

## 2011-11-19 MED ORDER — REGADENOSON 0.4 MG/5ML IV SOLN
0.4000 mg | Freq: Once | INTRAVENOUS | Status: AC
  Administered 2011-11-19: 0.4 mg via INTRAVENOUS

## 2011-11-19 NOTE — Progress Notes (Signed)
[facility] SITE 3 NUCLEAR MED [id number] [place] [address] [phone]  Cardiology Nuclear Med Study  Jeremy Howell is a 56 y.o. male     MRN : [id number]     DOB: 1955/05/03  Procedure Date: 11/19/2011  Nuclear Med Background Indication for Stress Test:  Evaluation for Ischemia and Pending Surgical Clearance for Lower Extremity Bypass by Dr. [name] History:  Chronic CHF/Cardiomyopathy; 1/13 Echo:EF=30-35%, severe LVH; 7/13 AKA LLE Cardiac Risk Factors: CVA, Family History - CAD, History of Smoking-Per Patient, Hypertension, IDDM Type 2, Lipids, PVD and Smoker  Symptoms:  No cardiac complaints.   Nuclear Pre-Procedure Caffeine/Decaff Intake:  None > 12 hrs NPO After: 8:00am   Lungs:  Clear. O2 Sat: 98% on room air. IV 0.9% NS with Angio Cath:  24g  IV Site: R Hand x 1, tolerated well IV Started by:  Irean Hong, RN  Chest Size (in):  36 Cup Size: n/a  Height: 5\' 4"  (1.626 m)  Weight:  115 lb (52.164 kg)  BMI:  Body mass index is 19.74 kg/(m^2). Tech Comments:  FBS was 131 at 6:00 am, Novolog insulin with breakfast    Nuclear Med Study 1 or 2 day study: 1 day  Stress Test Type:  Eugenie Birks  Reading MD: Marca Ancona, MD  Order Authorizing Provider:  Olga Millers, MD  Resting Radionuclide: Technetium 34m Sestamibi  Resting Radionuclide Dose: 11.0 mCi   Stress Radionuclide:  Technetium 50m Sestamibi  Stress Radionuclide Dose: 33.0 mCi           Stress Protocol Rest HR: 59 Stress HR: 92  Rest BP: 115/65 Stress BP: 116/62  Exercise Time (min): n/a METS: n/a   Predicted Max HR: 164 bpm % Max HR: 56.1 bpm Rate Pressure Product: 62952   Dose of Adenosine (mg):  n/a Dose of Lexiscan: 0.4 mg  Dose of Atropine (mg): n/a Dose of Dobutamine: n/a   Stress Test Technologist: Smiley Houseman, CMA-N  Nuclear Technologist:  Domenic Polite, CNMT     Rest Procedure:  Myocardial perfusion imaging was performed at rest 45 minutes  following the intravenous administration of Technetium 64m Sestamibi.  Rest ECG: NIVCD.  Stress Procedure:  The patient received IV Lexiscan 0.4 mg over 15-seconds.  Technetium 72m Sestamibi was injected at 30-seconds.  There were nonspecific T-wave changes with Lexiscan.  Quantitative spect images were obtained after a 45 minute delay.  Stress ECG: No significant change from baseline ECG  QPS Raw Data Images:  Normal; no motion artifact; normal heart/lung ratio. Stress Images:  Normal homogeneous uptake in all areas of the myocardium. Rest Images:  Normal homogeneous uptake in all areas of the myocardium. Subtraction (SDS):  There is no evidence of scar or ischemia. Transient Ischemic Dilatation (Normal <1.22):  0.97 Lung/Heart Ratio (Normal <0.45):  0.32  Quantitative Gated Spect Images QGS EDV:  151 ml QGS ESV:  97 ml  Impression Exercise Capacity:  Lexiscan with no exercise. BP Response:  Normal blood pressure response. Clinical Symptoms:  Short of breath.  ECG Impression:  No significant ST segment change suggestive of ischemia. Comparison with Prior Nuclear Study: No previous nuclear study performed  Overall Impression:  Low risk stress nuclear study.  No evidence for ischemia or infarction by perfusion images but there is global moderate hypokinesis on gated images.  Suspect nonischemic cardiomyopathy.   LV Ejection Fraction: 35%.  LV Wall Motion:  Moderate global hypokinesis.   Marca Ancona 11/19/2011

## 2011-11-26 ENCOUNTER — Encounter: Payer: Self-pay | Admitting: Neurosurgery

## 2011-11-26 ENCOUNTER — Ambulatory Visit (INDEPENDENT_AMBULATORY_CARE_PROVIDER_SITE_OTHER): Payer: Medicare Other | Admitting: Neurosurgery

## 2011-11-26 VITALS — BP 157/92 | HR 66 | Resp 16 | Ht 64.0 in | Wt 118.0 lb

## 2011-11-26 DIAGNOSIS — I739 Peripheral vascular disease, unspecified: Secondary | ICD-10-CM

## 2011-11-26 DIAGNOSIS — Z01818 Encounter for other preprocedural examination: Secondary | ICD-10-CM

## 2011-11-26 NOTE — Progress Notes (Addendum)
[facility] [place] HISTORY AND PHYSICAL   CC: Preop H&P Referring Physician: Fields  History of Present Illness: 56 year old male patient of Dr. Darrick Penna seen for preoperative H&P due to the fact he was hospitalized 10/22/2011 with pneumonia. The patient was taken care of by [facility] while hospitalized. The patient is seen with a caregiver from his current residence facility who states he continues his antibiotics for the pneumonia but has much improved. The patient is currently on 2 L nasal cannula oxygen. The patient is chronically dyspneic but does not appear to be short of breath today and is able to provide history.  Past Medical History  Diagnosis Date  . Cardiomyopathy     EF 20-25% by echo 01/2010 with severe LVH felt secondary to substance abuse (no ischemic workup)  . 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 Diabetes mellitus   . HTN (hypertension)   . Polysubstance abuse     Reported hx of cocaine, THC, heroin, EtOH abuse  . Noncompliance   . Acute renal failure   . Peripheral vascular disease     ROS: [x]  Positive   [ ]  Denies    General: [ ]  Weight loss, [ ]  Fever, [ ]  chills Neurologic: [ ]  Dizziness, [ ]  Blackouts, [ ]  Seizure [ ]  Stroke, [ ]  "Mini stroke", [ ]  Slurred speech, [ ]  Temporary blindness; [ ]  weakness in arms or legs, [ ]  Hoarseness Cardiac: [ ]  Chest pain/pressure, [ ]  Shortness of breath at rest [ ]  Shortness of breath with exertion, [ ]  Atrial fibrillation or irregular heartbeat Vascular: [ ]  Pain in legs with walking, [ ]  Pain in legs at rest, [ ]  Pain in legs at night,  [ ]  Non-healing ulcer, [ ]  Blood clot in vein/DVT,   Pulmonary: [ ]  Home oxygen, [ ]  Productive cough, [ ]  Coughing up blood, [ ]  Asthma,  [x ] Wheezing Musculoskeletal:  [ ]  Arthritis, [ ]  Low back pain, [ ]  Joint pain Hematologic: [ ]  Easy Bruising, [ ]  Anemia; [ ]   Hepatitis Gastrointestinal: [ ]  Blood in stool, [ ]  Gastroesophageal Reflux/heartburn, [ ]  Trouble swallowing Urinary: [ ]  chronic Kidney disease, [ ]  on HD - [ ]  MWF or [ ]  TTHS, [ ]  Burning with urination, [ ]  Difficulty urinating Skin: [ ]  Rashes, [ ]  Wounds Psychological: [ ]  Anxiety, [ ]  Depression   Social History History  Substance Use Topics  . Smoking status: Current Every Day Smoker -- 0.5 packs/day for 15 years    Types: Cigarettes  . Smokeless tobacco: Current User   Comment: declined  . Alcohol Use: 1.0 oz/week    2 drink(s) per week    Family History Family History  Problem Relation Age of Onset  . Stroke Brother     Allergies  Allergen Reactions  . Almond Oil Shortness Of Breath and Swelling    Current Outpatient Prescriptions  Medication Sig Dispense Refill  . amLODipine (NORVASC) 10 MG tablet Take 10 mg by mouth daily.      Marland Kitchen aspirin 325 MG tablet Take 325 mg by mouth daily.      . carvedilol (COREG) 25 MG tablet Take 25 mg by mouth 2 (two) times daily with a meal.      . ceFAZolin (ANCEF) 2-3 GM-% SOLR Inject 50 mLs (2 g total) into the vein every 8 (eight) hours.  1 each  99  . folic acid (FOLVITE) 1 MG tablet Take 1 mg by mouth daily.      . food thickener (THICK IT) POWD Take 6 g by mouth as needed.  4536 g  99  . insulin aspart (NOVOLOG) 100 UNIT/ML injection Inject 3 Units into the skin 4 (four) times daily - after meals and at bedtime. If CBG > 150.      Marland Kitchen insulin glargine (LANTUS) 100 UNIT/ML injection Inject 5 Units into the skin at bedtime.      . magnesium hydroxide (MILK OF MAGNESIA) 400 MG/5ML suspension Take 30 mLs by mouth daily as needed. For constipation.      . nicotine (NICODERM CQ - DOSED IN MG/24 HOURS) 21 mg/24hr patch Place 1 patch onto the skin daily.      Marland Kitchen ofloxacin (OCUFLOX) 0.3 % ophthalmic solution Place 2 drops into both eyes 4 (four) times daily.      . pravastatin (PRAVACHOL) 40 MG tablet         Physical  Examination  Filed Vitals:   11/26/11 1533  BP: 157/92  Pulse: 66  Resp: 16    Body mass index is 20.25 kg/(m^2).  General:  WDWN in NAD Gait: Normal HENT: WNL Eyes: Pupils equal Pulmonary: normal non-labored breathing , without Rales, rhonchi,  wheezing Cardiac: RRR, without  Murmurs, rubs or gallops; No carotid bruits Abdomen: soft, NT, no masses Skin: no rashes, ulcers noted Vascular Exam/Pulses: Right lower extremity pulses are not palpable, no carotid bruits are heard  Extremities without ischemic changes, no Gangrene , no cellulitis; no open wounds;  Musculoskeletal: no muscle wasting or atrophy  Neurologic: A&O X 3; Appropriate Affect ; SENSATION: normal; MOTOR FUNCTION:  moving all extremities equally. Speech is fluent/normal  Non-Invasive Vascular Imaging: None  ASSESSMENT: This patient will be scheduled for surgery with Dr. Darrick Penna, this is pending that his pneumonia has resolved and mobile tolerate anesthesia.  PLAN: The patient will be scheduled for a right femoral to posterior tibial artery bypass on 12/09/2011 with Dr. Darrick Penna. His followup in the office will be pending that procedure. The patient's questions were encouraged and answered, he is in agreement with this date.  Jeremy Howell ANP  Clinic M.D.: Fields   Details as above.  Pt will need re evaluation in office prior to his bypass to make sure his pulmonary status is reasonable and he is not too deconditioned to tolerate operation.  [name], MD [facility] [place] Office: [phone] Pager: [phone]

## 2011-11-27 ENCOUNTER — Telehealth: Payer: Self-pay | Admitting: Vascular Surgery

## 2011-11-27 ENCOUNTER — Encounter: Payer: Self-pay | Admitting: *Deleted

## 2011-11-27 ENCOUNTER — Other Ambulatory Visit: Payer: Self-pay | Admitting: *Deleted

## 2011-11-27 NOTE — Telephone Encounter (Signed)
Per staff message from Oswaldo Done, RN and Dr.Fields on 11/27/11, pt is to be seen here on 12/10/11 at 1:15pm prior to surgery. I spoke w/ Jeremy Howell @ [facility] [phone] and notified her of the appointment. [name]

## 2011-12-03 ENCOUNTER — Ambulatory Visit: Payer: Medicare Other | Admitting: Neurosurgery

## 2011-12-09 ENCOUNTER — Encounter: Payer: Self-pay | Admitting: Vascular Surgery

## 2011-12-09 ENCOUNTER — Inpatient Hospital Stay (HOSPITAL_COMMUNITY): Admission: RE | Admit: 2011-12-09 | Payer: Medicare Other | Source: Ambulatory Visit | Admitting: Vascular Surgery

## 2011-12-09 ENCOUNTER — Encounter: Payer: Medicare Other | Admitting: Cardiology

## 2011-12-09 ENCOUNTER — Encounter (HOSPITAL_COMMUNITY): Admission: RE | Payer: Self-pay | Source: Ambulatory Visit

## 2011-12-09 SURGERY — CREATION, BYPASS, ARTERIAL, FEMORAL TO TIBIAL, USING GRAFT
Anesthesia: General | Site: Leg Upper | Laterality: Right

## 2011-12-09 NOTE — Progress Notes (Signed)
HPI: 56 year old male I initially saw in Sept 2013 for preoperative evaluation prior to peripheral vascular surgery. Patient has a history of cardiomyopathy felt possibly secondary to hypertension. Echocardiogram in January of 2013 showed an ejection fraction of 30-35%. There was severe left ventricular hypertrophy, mild left atrial enlargement and grade 1 diastolic dysfunction. Carotid Dopplers in January 2013 were normal. Patient admitted in July of 2013 with critical limb ischemia and rhabdomyolysis. 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 His renal function improved with therapy. He was noted to have severe peripheral vascular disease and had followup arteriogram. He will require surgical intervention and cardiology was asked to evaluate preoperatively. He denies dyspnea, chest pain or syncope. Note his initial laboratories showed a BUN of 142 and a creatinine of 9.97. This improved and his most recent BUN and creatinine were 12 and 0.79. This was performed on 11/24/11. Nuclear study was performed on 11/19/2011 and showed an ejection fraction of 35% and global hypokinesis. There was no ischemia or infarction.    Current Outpatient Prescriptions  Medication Sig Dispense Refill  . amLODipine (NORVASC) 10 MG tablet Take 10 mg by mouth daily.      Marland Kitchen aspirin 325 MG tablet Take 325 mg by mouth daily.      . carvedilol (COREG) 25 MG tablet Take 25 mg by mouth 2 (two) times daily with a meal.      . ceFAZolin (ANCEF) 2-3 GM-% SOLR Inject 50 mLs (2 g total) into the vein every 8 (eight) hours.  1 each  99  . folic acid (FOLVITE) 1 MG tablet Take 1 mg by mouth daily.      . food thickener (THICK IT) POWD Take 6 g by mouth as needed.  4536 g  99  . insulin aspart (NOVOLOG) 100 UNIT/ML injection Inject 3 Units into the skin 4 (four) times daily - after meals and at bedtime. If CBG > 150.      Marland Kitchen insulin glargine (LANTUS) 100 UNIT/ML injection Inject 5 Units into the skin at bedtime.        . magnesium hydroxide (MILK OF MAGNESIA) 400 MG/5ML suspension Take 30 mLs by mouth daily as needed. For constipation.      . nicotine (NICODERM CQ - DOSED IN MG/24 HOURS) 21 mg/24hr patch Place 1 patch onto the skin daily.      Marland Kitchen ofloxacin (OCUFLOX) 0.3 % ophthalmic solution Place 2 drops into both eyes 4 (four) times daily.      . pravastatin (PRAVACHOL) 40 MG tablet          Past Medical History  Diagnosis Date  . Cardiomyopathy     EF 20-25% by echo 01/2010 with severe LVH felt secondary to substance abuse (no ischemic workup)  . 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 Diabetes mellitus   . HTN (hypertension)   . Polysubstance abuse     Reported hx of cocaine, THC, heroin, EtOH abuse  . Noncompliance   . Acute renal failure   . Peripheral vascular disease     Past Surgical History  Procedure Date  . Total hip arthroplasty   . Amputation 08/15/2011    Procedure: AMPUTATION ABOVE KNEE;  Surgeon: Sherren Kerns, MD;  Location: [facility] OR;  Service: Vascular;  Laterality: Left;  Amputation End:  1610    History   Social History  . Marital Status:  Single    Spouse Name: N/A    Number of Children: N/A  . Years of Education: N/A   Occupational History  . Not on file.   Social History Main Topics  . Smoking status: Current Every Day Smoker -- 0.5 packs/day for 15 years    Types: Cigarettes  . Smokeless tobacco: Current User   Comment: declined  . Alcohol Use: 1.0 oz/week    2 drink(s) per week  . Drug Use: No     + previous history  . Sexually Active: Not on file   Other Topics Concern  . Not on file   Social History Narrative   Lives with sister     ROS: no fevers or chills, productive cough, hemoptysis, dysphasia, odynophagia, melena, hematochezia, dysuria, hematuria, rash, seizure activity, orthopnea, PND, pedal edema, claudication. Remaining systems are negative.  Physical Exam: Well-developed  well-nourished in no acute distress.  Skin is warm and dry.  HEENT is normal.  Neck is supple.  Chest is clear to auscultation with normal expansion.  Cardiovascular exam is regular rate and rhythm.  Abdominal exam nontender or distended. No masses palpated. Extremities show no edema. neuro grossly intact  ECG     This encounter was created in error - please disregard.

## 2011-12-10 ENCOUNTER — Ambulatory Visit (INDEPENDENT_AMBULATORY_CARE_PROVIDER_SITE_OTHER): Payer: Medicare Other | Admitting: Vascular Surgery

## 2011-12-10 ENCOUNTER — Encounter: Payer: Self-pay | Admitting: Vascular Surgery

## 2011-12-10 VITALS — BP 137/80 | HR 66 | Ht 64.0 in | Wt 118.0 lb

## 2011-12-10 DIAGNOSIS — I739 Peripheral vascular disease, unspecified: Secondary | ICD-10-CM

## 2011-12-10 NOTE — Progress Notes (Addendum)
[facility] [place] History and Physical 12/10/2011 DOB: [id number] MRN : [id number]     History of Present Illness: 56 year old male patient of Dr. Darrick Penna seen for preoperative H&P due to the fact he was hospitalized 10/22/2011 with pneumonia. The patient was taken care of by [facility] while hospitalized. The patient is seen with a caregiver from his current residence facility who states he continues have PICC line, but is no longer on IV antibioticsThe patient is no longer on O2 oxygen. The patient is chronically dyspneic but does not appear to be short of breath today and is able to provide history.   Past Medical History  Diagnosis Date  . Cardiomyopathy     EF 20-25% by echo 01/2010 with severe LVH felt secondary to substance abuse (no ischemic workup)  . 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 Diabetes mellitus   . HTN (hypertension)   . Polysubstance abuse     Reported hx of cocaine, THC, heroin, EtOH abuse  . Noncompliance   . Acute renal failure   . Peripheral vascular disease     Past Surgical History  Procedure Date  . Total hip arthroplasty   . Amputation 08/15/2011    Procedure: AMPUTATION ABOVE KNEE;  Surgeon: Sherren Kerns, MD;  Location: [facility] OR;  Service: Vascular;  Laterality: Left;  Amputation End:  0855     ROS: [x]  Positive  [ ]  Denies    General: [ ]  Weight loss, [ ]  Fever, [ ]  chills Neurologic: [ ]  Dizziness, [ ]  Blackouts, [ ]  Seizure [ ]  Stroke, [ ]  "Mini stroke", [ ]  Slurred speech, [ ]  Temporary blindness; [x ] weakness in arms or legs due to stroke, [ ]  Hoarseness Cardiac: [ ]  Chest pain/pressure, [ ]  Shortness of breath at rest [ ]  Shortness of breath with exertion, [ ]  Atrial fibrillation or irregular heartbeat Vascular: [ ]  Pain in legs with walking, [x ] Pain in legs at rest right foot, [ ]  Pain in legs at night,  [ ]  Non-healing ulcer, [ ]  Blood  clot in vein/DVT,   Pulmonary: [ ]  Home oxygen, [ ]  Productive cough, [ ]  Coughing up blood, [ ]  Asthma,  [ ]  Wheezing Musculoskeletal:  [ ]  Arthritis, [ ]  Low back pain, [ ]  Joint pain Hematologic: [ ]  Easy Bruising, [ ]  Anemia; [ ]  Hepatitis Gastrointestinal: [ ]  Blood in stool, [ ]  Gastroesophageal Reflux/heartburn, [ ]  Trouble swallowing Urinary: [ ]  chronic Kidney disease, [ ]  on HD - [ ]  MWF or [ ]  TTHS, [ ]  Burning with urination, [ ]  Difficulty urinating Skin: [ ]  Rashes, [ ]  Wounds Psychological: [ ]  Anxiety, [ ]  Depression  Social History History  Substance Use Topics  . Smoking status: Former Smoker -- 15 years    Types: Cigarettes  . Smokeless tobacco: Current User   Comment: declined  . Alcohol Use: 1.0 oz/week    2 drink(s) per week    Family History Family History  Problem Relation Age of Onset  . Stroke Brother     Allergies  Allergen Reactions  . Almond Oil Shortness Of Breath and Swelling    Current Outpatient Prescriptions  Medication Sig Dispense Refill  . amLODipine (NORVASC) 10 MG tablet Take 10 mg by mouth daily.      Marland Kitchen aspirin 325 MG tablet Take 325 mg by mouth daily.      Marland Kitchen  carvedilol (COREG) 25 MG tablet Take 25 mg by mouth 2 (two) times daily with a meal.      . ceFAZolin (ANCEF) 2-3 GM-% SOLR Inject 50 mLs (2 g total) into the vein every 8 (eight) hours.  1 each  99  . folic acid (FOLVITE) 1 MG tablet Take 1 mg by mouth daily.      . food thickener (THICK IT) POWD Take 6 g by mouth as needed.  4536 g  99  . insulin aspart (NOVOLOG) 100 UNIT/ML injection Inject 3 Units into the skin 4 (four) times daily - after meals and at bedtime. If CBG > 150.      Marland Kitchen insulin glargine (LANTUS) 100 UNIT/ML injection Inject 5 Units into the skin at bedtime.      . magnesium hydroxide (MILK OF MAGNESIA) 400 MG/5ML suspension Take 30 mLs by mouth daily as needed. For constipation.      . nicotine (NICODERM CQ - DOSED IN MG/24 HOURS) 21 mg/24hr patch Place 1 patch  onto the skin daily.      Marland Kitchen ofloxacin (OCUFLOX) 0.3 % ophthalmic solution Place 2 drops into both eyes 4 (four) times daily.      . pravastatin (PRAVACHOL) 40 MG tablet          Imaging: No results found.  Significant Diagnostic Studies: CBC Lab Results  Component Value Date   WBC 6.7 10/27/2011   HGB 11.6* 10/27/2011   HCT 35.5* 10/27/2011   MCV 78.9 10/27/2011   PLT 276 10/27/2011    BMET    Component Value Date/Time   NA 138 10/29/2011 0500   K 3.5 10/29/2011 0500   CL 102 10/29/2011 0500   CO2 29 10/29/2011 0500   GLUCOSE 103* 10/29/2011 0500   BUN 9 10/29/2011 0500   CREATININE 0.74 10/29/2011 0500   CALCIUM 9.0 10/29/2011 0500   GFRNONAA >90 10/29/2011 0500   GFRAA >90 10/29/2011 0500    COAG Lab Results  Component Value Date   INR 1.00 10/22/2011   INR 1.21 08/15/2011   INR 1.24 08/14/2011   No results found for this basename: PTT     Physical Examination BP Readings from Last 3 Encounters:  12/10/11 137/80  11/26/11 157/92  11/19/11 115/65   Temp Readings from Last 3 Encounters:  10/29/11 97.8 F (36.6 C) Oral  10/16/11 98.4 F (36.9 C)   10/16/11 98.4 F (36.9 C)    SpO2 Readings from Last 3 Encounters:  12/10/11 100%  11/26/11 100%  10/29/11 94%   Pulse Readings from Last 3 Encounters:  12/10/11 66  11/26/11 66  11/19/11 59  General: WDWN in NAD  Gait: Normal  HENT: WNL  Eyes: Pupils equal  Pulmonary: normal non-labored breathing , without Rales, rhonchi, wheezing  Cardiac: RRR, without Murmurs, rubs or gallops;  No carotid bruits  Abdomen: soft, NT, no masses  Skin: no rashes, ulcers noted  Vascular Exam/Pulses: Right lower extremity pulses are not palpable, Extremities: 2+ femoral pulses bilaterally, well-healed left above-knee amputation staples removed today, popliteal and pedal pulses non-palpable right foot  no carotid bruits are heard  Extremities without ischemic changes, no Gangrene , no cellulitis; no open wounds;  Musculoskeletal: right  sided muscle wasting or atrophy due to stroke history Neurologic: A&O X 3; Appropriate Affect ; SENSATION: normal; MOTOR FUNCTION: moving all extremitiesSpeech is fluent/normal  Non ambulatory uses WC for mobility.    Non-Invasive Vascular Imaging:  ASSESSMENT/PLAN:The patient will be scheduled for a right femoral to  posterior tibial artery bypass 12-22-11 The main risk is lung function due to be intubated during the surgery.      History and exam details as above. 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  He does use the right leg to transfer.  The patient overall has recovered from his pneumonia. He has multiple comorbidities but is hopeful to salvage his remaining extremity. Risks benefits possible complications and procedure details were discussed with the patient and his family today. Posterior tibial bypass is scheduled for November 12.  [name], MD [facility] [place] Office: [phone] Pager: [phone]

## 2011-12-21 ENCOUNTER — Other Ambulatory Visit: Payer: Self-pay

## 2011-12-22 ENCOUNTER — Encounter (HOSPITAL_COMMUNITY)
Admission: RE | Admit: 2011-12-22 | Discharge: 2011-12-22 | Disposition: A | Discharge status: Home / Self Care | Payer: Medicare Other | Source: Ambulatory Visit | Attending: Vascular Surgery | Admitting: Vascular Surgery

## 2011-12-22 ENCOUNTER — Encounter (HOSPITAL_COMMUNITY): Payer: Self-pay

## 2011-12-22 HISTORY — DX: Need for assistance with personal care: Z74.1

## 2011-12-22 HISTORY — DX: Dependence on wheelchair: Z99.3

## 2011-12-22 HISTORY — DX: Heart failure, unspecified: I50.9

## 2011-12-22 HISTORY — DX: Pneumonia, unspecified organism: J18.9

## 2011-12-22 LAB — URINALYSIS, ROUTINE W REFLEX MICROSCOPIC
Bilirubin Urine: NEGATIVE
Glucose, UA: NEGATIVE mg/dL
Hgb urine dipstick: NEGATIVE
Specific Gravity, Urine: 1.016 (ref 1.005–1.030)
Urobilinogen, UA: 0.2 mg/dL (ref 0.0–1.0)

## 2011-12-22 LAB — COMPREHENSIVE METABOLIC PANEL
AST: 23 U/L (ref 0–37)
Albumin: 3.5 g/dL (ref 3.5–5.2)
Alkaline Phosphatase: 93 U/L (ref 39–117)
BUN: 18 mg/dL (ref 6–23)
CO2: 24 mEq/L (ref 19–32)
Chloride: 100 mEq/L (ref 96–112)
Creatinine, Ser: 0.84 mg/dL (ref 0.50–1.35)
GFR calc non Af Amer: >90 mL/min (ref >90)
Potassium: 4.6 mEq/L (ref 3.5–5.1)
Total Bilirubin: 0.2 mg/dL — ABNORMAL LOW (ref 0.3–1.2)

## 2011-12-22 LAB — CBC
HCT: 41.1 % (ref 39.0–52.0)
MCV: 79.8 fL (ref 78.0–100.0)
RBC: 5.15 MIL/uL (ref 4.22–5.81)
WBC: 4.3 10*3/uL (ref 4.0–10.5)

## 2011-12-22 LAB — TYPE AND SCREEN
ABO/RH(D): B POS
Antibody Screen: NEGATIVE

## 2011-12-22 LAB — APTT: aPTT: 30 seconds (ref 24–37)

## 2011-12-22 MED ORDER — DEXTROSE 5 % IV SOLN
1.5000 g | INTRAVENOUS | Status: DC
  Filled 2011-12-22: qty 1.5

## 2011-12-22 NOTE — Consult Note (Signed)
Anesthesiology Chart Review  56 y/o male for R. Fem-Tib BPG 12/23/11. Pt had nuclear stress test 11/19/11 (-) for ischemia EF 30--35%. Creatinine 0.84. Clinical data appears acceptable, will evaluate patient in AM.  Kipp Brood, MD

## 2011-12-23 ENCOUNTER — Encounter (HOSPITAL_COMMUNITY): Payer: Self-pay | Admitting: *Deleted

## 2011-12-23 ENCOUNTER — Encounter (HOSPITAL_COMMUNITY): Payer: Self-pay | Admitting: Anesthesiology

## 2011-12-23 ENCOUNTER — Encounter (HOSPITAL_COMMUNITY)
Admission: RE | Disposition: A | Discharge status: Skilled Nursing Facility | Payer: Self-pay | Source: Ambulatory Visit | Attending: Vascular Surgery

## 2011-12-23 ENCOUNTER — Inpatient Hospital Stay (HOSPITAL_COMMUNITY)
Admission: RE | Admit: 2011-12-23 | Discharge: 2011-12-28 | DRG: 253 | Disposition: A | Discharge status: Skilled Nursing Facility | Payer: Medicare Other | Source: Ambulatory Visit | Attending: Vascular Surgery | Admitting: Vascular Surgery

## 2011-12-23 ENCOUNTER — Inpatient Hospital Stay (HOSPITAL_COMMUNITY): Payer: Medicare Other | Admitting: Anesthesiology

## 2011-12-23 DIAGNOSIS — N179 Acute kidney failure, unspecified: Secondary | ICD-10-CM | POA: Diagnosis present

## 2011-12-23 DIAGNOSIS — Z01812 Encounter for preprocedural laboratory examination: Secondary | ICD-10-CM

## 2011-12-23 DIAGNOSIS — Z96649 Presence of unspecified artificial hip joint: Secondary | ICD-10-CM

## 2011-12-23 DIAGNOSIS — E860 Dehydration: Secondary | ICD-10-CM | POA: Diagnosis present

## 2011-12-23 DIAGNOSIS — Z823 Family history of stroke: Secondary | ICD-10-CM

## 2011-12-23 DIAGNOSIS — I10 Essential (primary) hypertension: Secondary | ICD-10-CM | POA: Diagnosis present

## 2011-12-23 DIAGNOSIS — Z7982 Long term (current) use of aspirin: Secondary | ICD-10-CM

## 2011-12-23 DIAGNOSIS — I5042 Chronic combined systolic (congestive) and diastolic (congestive) heart failure: Secondary | ICD-10-CM | POA: Diagnosis present

## 2011-12-23 DIAGNOSIS — Z8673 Personal history of transient ischemic attack (TIA), and cerebral infarction without residual deficits: Secondary | ICD-10-CM

## 2011-12-23 DIAGNOSIS — E119 Type 2 diabetes mellitus without complications: Secondary | ICD-10-CM | POA: Diagnosis present

## 2011-12-23 DIAGNOSIS — I509 Heart failure, unspecified: Secondary | ICD-10-CM | POA: Diagnosis present

## 2011-12-23 DIAGNOSIS — I70229 Atherosclerosis of native arteries of extremities with rest pain, unspecified extremity: Principal | ICD-10-CM | POA: Diagnosis present

## 2011-12-23 DIAGNOSIS — I428 Other cardiomyopathies: Secondary | ICD-10-CM | POA: Diagnosis present

## 2011-12-23 DIAGNOSIS — I739 Peripheral vascular disease, unspecified: Secondary | ICD-10-CM

## 2011-12-23 DIAGNOSIS — S78119A Complete traumatic amputation at level between unspecified hip and knee, initial encounter: Secondary | ICD-10-CM

## 2011-12-23 DIAGNOSIS — Z79899 Other long term (current) drug therapy: Secondary | ICD-10-CM

## 2011-12-23 DIAGNOSIS — Z87891 Personal history of nicotine dependence: Secondary | ICD-10-CM

## 2011-12-23 DIAGNOSIS — Z794 Long term (current) use of insulin: Secondary | ICD-10-CM

## 2011-12-23 HISTORY — PX: FEMORAL-TIBIAL BYPASS GRAFT: SHX938

## 2011-12-23 LAB — GLUCOSE, CAPILLARY
Glucose-Capillary: 85 mg/dL (ref 70–99)
Glucose-Capillary: 87 mg/dL (ref 70–99)
Glucose-Capillary: 103 mg/dL — ABNORMAL HIGH (ref 70–99)
Glucose-Capillary: 94 mg/dL (ref 70–99)

## 2011-12-23 SURGERY — CREATION, BYPASS, ARTERIAL, FEMORAL TO TIBIAL, USING GRAFT
Anesthesia: General | Site: Leg Lower | Laterality: Right | Wound class: Clean

## 2011-12-23 MED ORDER — ONDANSETRON HCL 4 MG/2ML IJ SOLN
INTRAMUSCULAR | Status: DC | PRN
  Administered 2011-12-23: 4 mg via INTRAVENOUS

## 2011-12-23 MED ORDER — CEFAZOLIN SODIUM-DEXTROSE 2-3 GM-% IV SOLR
2.0000 g | Freq: Three times a day (TID) | INTRAVENOUS | Status: DC
Start: 2011-12-23 — End: 2011-12-23

## 2011-12-23 MED ORDER — SODIUM CHLORIDE 0.9 % IV SOLN: 500.0000 mL | Freq: Once | INTRAVENOUS | Status: AC | PRN

## 2011-12-23 MED ORDER — SODIUM CHLORIDE 0.9 % IR SOLN
Status: DC | PRN
  Administered 2011-12-23: 14:00:00

## 2011-12-23 MED ORDER — ALUM & MAG HYDROXIDE-SIMETH 200-200-20 MG/5ML PO SUSP: 15.0000 mL | ORAL | Status: DC | PRN

## 2011-12-23 MED ORDER — TEMAZEPAM 15 MG PO CAPS: 15.0000 mg | ORAL_CAPSULE | Freq: Every evening | ORAL | Status: DC | PRN

## 2011-12-23 MED ORDER — POTASSIUM CHLORIDE CRYS ER 20 MEQ PO TBCR: 20.0000 meq | EXTENDED_RELEASE_TABLET | Freq: Once | ORAL | Status: AC | PRN

## 2011-12-23 MED ORDER — DOPAMINE-DEXTROSE 3.2-5 MG/ML-% IV SOLN: 3.0000 ug/kg/min | INTRAVENOUS | Status: DC

## 2011-12-23 MED ORDER — MORPHINE SULFATE 2 MG/ML IJ SOLN
2.0000 mg | INTRAMUSCULAR | Status: DC | PRN
  Administered 2011-12-24 (×2): 2 mg via INTRAVENOUS
  Filled 2011-12-23 (×2): qty 1

## 2011-12-23 MED ORDER — PROPOFOL 10 MG/ML IV BOLUS
INTRAVENOUS | Status: DC | PRN
  Administered 2011-12-23: 200 mg via INTRAVENOUS

## 2011-12-23 MED ORDER — CARVEDILOL 25 MG PO TABS
25.0000 mg | ORAL_TABLET | Freq: Two times a day (BID) | ORAL | Status: DC
  Administered 2011-12-24 – 2011-12-28 (×8): 25 mg via ORAL
  Filled 2011-12-23 (×11): qty 1

## 2011-12-23 MED ORDER — MAGNESIUM SULFATE 40 MG/ML IJ SOLN
2.0000 g | Freq: Once | INTRAMUSCULAR | Status: AC | PRN
  Filled 2011-12-23: qty 50

## 2011-12-23 MED ORDER — NEOSTIGMINE METHYLSULFATE 1 MG/ML IJ SOLN
INTRAMUSCULAR | Status: DC | PRN
  Administered 2011-12-23: 3 mg via INTRAVENOUS

## 2011-12-23 MED ORDER — OXYCODONE HCL 5 MG PO TABS
5.0000 mg | ORAL_TABLET | ORAL | Status: DC | PRN
  Administered 2011-12-24 – 2011-12-27 (×8): 10 mg via ORAL
  Administered 2011-12-28: 5 mg via ORAL
  Filled 2011-12-23 (×3): qty 2
  Filled 2011-12-23: qty 1
  Filled 2011-12-23 (×6): qty 2

## 2011-12-23 MED ORDER — BISACODYL 10 MG RE SUPP: 10.0000 mg | Freq: Every day | RECTAL | Status: DC | PRN

## 2011-12-23 MED ORDER — ACETAMINOPHEN 650 MG RE SUPP: 325.0000 mg | RECTAL | Status: DC | PRN

## 2011-12-23 MED ORDER — GUAIFENESIN-DM 100-10 MG/5ML PO SYRP: 15.0000 mL | ORAL_SOLUTION | ORAL | Status: DC | PRN

## 2011-12-23 MED ORDER — HYDROMORPHONE HCL PF 1 MG/ML IJ SOLN
INTRAMUSCULAR | Status: AC
  Filled 2011-12-23: qty 1

## 2011-12-23 MED ORDER — ONDANSETRON HCL 4 MG/2ML IJ SOLN: 4.0000 mg | Freq: Four times a day (QID) | INTRAMUSCULAR | Status: DC | PRN

## 2011-12-23 MED ORDER — AMLODIPINE BESYLATE 10 MG PO TABS
10.0000 mg | ORAL_TABLET | Freq: Every day | ORAL | Status: DC
  Administered 2011-12-23 – 2011-12-28 (×6): 10 mg via ORAL
  Filled 2011-12-23 (×6): qty 1

## 2011-12-23 MED ORDER — OFLOXACIN 0.3 % OP SOLN
2.0000 [drp] | Freq: Four times a day (QID) | OPHTHALMIC | Status: DC
  Administered 2011-12-23 – 2011-12-28 (×17): 2 [drp] via OPHTHALMIC
  Filled 2011-12-23: qty 5

## 2011-12-23 MED ORDER — MAGNESIUM HYDROXIDE 400 MG/5ML PO SUSP
30.0000 mL | Freq: Every day | ORAL | Status: DC | PRN
  Administered 2011-12-26: 30 mL via ORAL
  Filled 2011-12-23: qty 30

## 2011-12-23 MED ORDER — ASPIRIN 325 MG PO TABS
325.0000 mg | ORAL_TABLET | Freq: Every day | ORAL | Status: DC
  Administered 2011-12-24 – 2011-12-28 (×5): 325 mg via ORAL
  Filled 2011-12-23 (×5): qty 1

## 2011-12-23 MED ORDER — SODIUM CHLORIDE 0.45 % IV SOLN
INTRAVENOUS | Status: DC
  Administered 2011-12-23: 22:00:00 via INTRAVENOUS

## 2011-12-23 MED ORDER — GLYCOPYRROLATE 0.2 MG/ML IJ SOLN
INTRAMUSCULAR | Status: DC | PRN
  Administered 2011-12-23: .4 mg via INTRAVENOUS

## 2011-12-23 MED ORDER — THROMBIN 20000 UNITS EX SOLR
CUTANEOUS | Status: AC
  Filled 2011-12-23: qty 20000

## 2011-12-23 MED ORDER — PAPAVERINE HCL 30 MG/ML IJ SOLN
INTRAMUSCULAR | Status: AC
  Filled 2011-12-23: qty 2

## 2011-12-23 MED ORDER — INSULIN ASPART 100 UNIT/ML ~~LOC~~ SOLN
3.0000 [IU] | Freq: Two times a day (BID) | SUBCUTANEOUS | Status: DC
  Administered 2011-12-24 – 2011-12-28 (×3): 3 [IU] via SUBCUTANEOUS

## 2011-12-23 MED ORDER — OXYCODONE HCL 5 MG PO TABS: 5.0000 mg | ORAL_TABLET | Freq: Once | ORAL | Status: DC | PRN

## 2011-12-23 MED ORDER — LACTATED RINGERS IV SOLN
INTRAVENOUS | Status: DC | PRN
  Administered 2011-12-23 (×3): via INTRAVENOUS

## 2011-12-23 MED ORDER — HYDROMORPHONE HCL PF 1 MG/ML IJ SOLN
0.2500 mg | INTRAMUSCULAR | Status: DC | PRN
  Administered 2011-12-23: 0.5 mg via INTRAVENOUS

## 2011-12-23 MED ORDER — DEXTROSE 5 % IV SOLN
1.5000 g | Freq: Two times a day (BID) | INTRAVENOUS | Status: AC
  Administered 2011-12-23 – 2011-12-24 (×3): 1.5 g via INTRAVENOUS
  Filled 2011-12-23 (×3): qty 1.5

## 2011-12-23 MED ORDER — VECURONIUM BROMIDE 10 MG IV SOLR
INTRAVENOUS | Status: DC | PRN
  Administered 2011-12-23: 10 mg via INTRAVENOUS

## 2011-12-23 MED ORDER — NICOTINE 21 MG/24HR TD PT24
21.0000 mg | MEDICATED_PATCH | TRANSDERMAL | Status: DC
  Administered 2011-12-23 – 2011-12-27 (×5): 21 mg via TRANSDERMAL
  Filled 2011-12-23 (×6): qty 1

## 2011-12-23 MED ORDER — SODIUM CHLORIDE 0.9 % IV SOLN: INTRAVENOUS | Status: DC

## 2011-12-23 MED ORDER — HYDRALAZINE HCL 20 MG/ML IJ SOLN
10.0000 mg | INTRAMUSCULAR | Status: DC | PRN
  Filled 2011-12-23: qty 0.5

## 2011-12-23 MED ORDER — HEMOSTATIC AGENTS (NO CHARGE) OPTIME
TOPICAL | Status: DC | PRN
  Administered 2011-12-23: 1

## 2011-12-23 MED ORDER — ACETAMINOPHEN 325 MG PO TABS: 325.0000 mg | ORAL_TABLET | ORAL | Status: DC | PRN

## 2011-12-23 MED ORDER — DOCUSATE SODIUM 100 MG PO CAPS
100.0000 mg | ORAL_CAPSULE | Freq: Every day | ORAL | Status: DC
  Administered 2011-12-24 – 2011-12-28 (×5): 100 mg via ORAL
  Filled 2011-12-23 (×5): qty 1

## 2011-12-23 MED ORDER — INSULIN ASPART 100 UNIT/ML ~~LOC~~ SOLN: 0.0000 [IU] | Freq: Three times a day (TID) | SUBCUTANEOUS | Status: DC

## 2011-12-23 MED ORDER — HYDROMORPHONE HCL PF 1 MG/ML IJ SOLN
INTRAMUSCULAR | Status: DC | PRN
  Administered 2011-12-23 (×2): 0.5 mg via INTRAVENOUS

## 2011-12-23 MED ORDER — METOPROLOL TARTRATE 1 MG/ML IV SOLN: 2.0000 mg | INTRAVENOUS | Status: DC | PRN

## 2011-12-23 MED ORDER — FENTANYL CITRATE 0.05 MG/ML IJ SOLN
INTRAMUSCULAR | Status: DC | PRN
  Administered 2011-12-23 (×4): 50 ug via INTRAVENOUS

## 2011-12-23 MED ORDER — PHENOL 1.4 % MT LIQD: 1.0000 | OROMUCOSAL | Status: DC | PRN

## 2011-12-23 MED ORDER — LABETALOL HCL 5 MG/ML IV SOLN
10.0000 mg | INTRAVENOUS | Status: DC | PRN
  Filled 2011-12-23: qty 4

## 2011-12-23 MED ORDER — LISINOPRIL 5 MG PO TABS
5.0000 mg | ORAL_TABLET | Freq: Every day | ORAL | Status: DC
  Administered 2011-12-23 – 2011-12-28 (×6): 5 mg via ORAL
  Filled 2011-12-23 (×6): qty 1

## 2011-12-23 MED ORDER — HEPARIN SODIUM (PORCINE) 1000 UNIT/ML IJ SOLN
INTRAMUSCULAR | Status: DC | PRN
  Administered 2011-12-23: 7000 [IU] via INTRAVENOUS

## 2011-12-23 MED ORDER — ARTIFICIAL TEARS OP OINT
TOPICAL_OINTMENT | OPHTHALMIC | Status: DC | PRN
  Administered 2011-12-23: 1 via OPHTHALMIC

## 2011-12-23 MED ORDER — FOLIC ACID 1 MG PO TABS
1.0000 mg | ORAL_TABLET | Freq: Every day | ORAL | Status: DC
  Administered 2011-12-23 – 2011-12-28 (×6): 1 mg via ORAL
  Filled 2011-12-23 (×6): qty 1

## 2011-12-23 MED ORDER — OXYCODONE HCL 5 MG/5ML PO SOLN: 5.0000 mg | Freq: Once | ORAL | Status: DC | PRN

## 2011-12-23 MED ORDER — EPHEDRINE SULFATE 50 MG/ML IJ SOLN
INTRAMUSCULAR | Status: DC | PRN
  Administered 2011-12-23 (×2): 10 mg via INTRAVENOUS

## 2011-12-23 MED ORDER — TRAMADOL HCL 50 MG PO TABS: 50.0000 mg | ORAL_TABLET | Freq: Two times a day (BID) | ORAL | Status: DC | PRN

## 2011-12-23 MED ORDER — LIDOCAINE HCL (CARDIAC) 20 MG/ML IV SOLN
INTRAVENOUS | Status: DC | PRN
  Administered 2011-12-23: 80 mg via INTRAVENOUS

## 2011-12-23 MED ORDER — PANTOPRAZOLE SODIUM 40 MG PO TBEC
40.0000 mg | DELAYED_RELEASE_TABLET | Freq: Every day | ORAL | Status: DC
  Administered 2011-12-23 – 2011-12-28 (×6): 40 mg via ORAL
  Filled 2011-12-23 (×6): qty 1

## 2011-12-23 MED ORDER — 0.9 % SODIUM CHLORIDE (POUR BTL) OPTIME
TOPICAL | Status: DC | PRN
  Administered 2011-12-23 (×2): 1000 mL

## 2011-12-23 SURGICAL SUPPLY — 68 items
BANDAGE ESMARK 6X9 LF (GAUZE/BANDAGES/DRESSINGS) IMPLANT
BNDG ESMARK 6X9 LF (GAUZE/BANDAGES/DRESSINGS)
CANISTER SUCTION 2500CC (MISCELLANEOUS) ×2 IMPLANT
CANNULA VESSEL W/WING (CANNULA) ×2 IMPLANT
CLIP TI MEDIUM 24 (CLIP) ×2 IMPLANT
CLIP TI WIDE RED SMALL 24 (CLIP) ×4 IMPLANT
CLOTH BEACON ORANGE TIMEOUT ST (SAFETY) ×2 IMPLANT
CORONARY SUCKER SOFT TIP 10052 (MISCELLANEOUS) IMPLANT
COVER SURGICAL LIGHT HANDLE (MISCELLANEOUS) ×2 IMPLANT
CUFF TOURNIQUET SINGLE 24IN (TOURNIQUET CUFF) IMPLANT
CUFF TOURNIQUET SINGLE 34IN LL (TOURNIQUET CUFF) IMPLANT
CUFF TOURNIQUET SINGLE 44IN (TOURNIQUET CUFF) IMPLANT
DECANTER SPIKE VIAL GLASS SM (MISCELLANEOUS) IMPLANT
DERMABOND ADVANCED (GAUZE/BANDAGES/DRESSINGS) ×1
DERMABOND ADVANCED .7 DNX12 (GAUZE/BANDAGES/DRESSINGS) ×1 IMPLANT
DRAIN SNY WOU (WOUND CARE) IMPLANT
DRAPE WARM FLUID 44X44 (DRAPE) ×2 IMPLANT
DRAPE X-RAY CASS 24X20 (DRAPES) IMPLANT
DRSG COVADERM 4X10 (GAUZE/BANDAGES/DRESSINGS) ×4 IMPLANT
DRSG COVADERM 4X6 (GAUZE/BANDAGES/DRESSINGS) ×2 IMPLANT
DRSG COVADERM 4X8 (GAUZE/BANDAGES/DRESSINGS) IMPLANT
ELECT CAUTERY BLADE 6.4 (BLADE) ×4 IMPLANT
ELECT REM PT RETURN 9FT ADLT (ELECTROSURGICAL) ×4
ELECTRODE REM PT RTRN 9FT ADLT (ELECTROSURGICAL) ×2 IMPLANT
EVACUATOR SILICONE 100CC (DRAIN) IMPLANT
GLOVE BIO SURGEON STRL SZ 6.5 (GLOVE) ×10 IMPLANT
GLOVE BIO SURGEON STRL SZ7.5 (GLOVE) ×2 IMPLANT
GLOVE BIOGEL PI IND STRL 6.5 (GLOVE) ×3 IMPLANT
GLOVE BIOGEL PI IND STRL 7.0 (GLOVE) ×1 IMPLANT
GLOVE BIOGEL PI INDICATOR 6.5 (GLOVE) ×3
GLOVE BIOGEL PI INDICATOR 7.0 (GLOVE) ×1
GLOVE SURG SS PI 7.0 STRL IVOR (GLOVE) ×2 IMPLANT
GOWN PREVENTION PLUS XLARGE (GOWN DISPOSABLE) ×2 IMPLANT
GOWN STRL NON-REIN LRG LVL3 (GOWN DISPOSABLE) ×8 IMPLANT
KIT BASIN OR (CUSTOM PROCEDURE TRAY) ×2 IMPLANT
KIT ROOM TURNOVER OR (KITS) ×2 IMPLANT
MARKER GRAFT CORONARY BYPASS (MISCELLANEOUS) IMPLANT
NS IRRIG 1000ML POUR BTL (IV SOLUTION) ×4 IMPLANT
PACK PERIPHERAL VASCULAR (CUSTOM PROCEDURE TRAY) ×2 IMPLANT
PAD ARMBOARD 7.5X6 YLW CONV (MISCELLANEOUS) ×4 IMPLANT
PADDING CAST COTTON 6X4 STRL (CAST SUPPLIES) IMPLANT
PENCIL BUTTON HOLSTER BLD 10FT (ELECTRODE) ×2 IMPLANT
SET COLLECT BLD 21X3/4 12 (NEEDLE) IMPLANT
SPONGE GAUZE 4X4 12PLY (GAUZE/BANDAGES/DRESSINGS) ×2 IMPLANT
SPONGE SURGIFOAM ABS GEL 100 (HEMOSTASIS) ×2 IMPLANT
STAPLER VISISTAT 35W (STAPLE) ×4 IMPLANT
STOPCOCK 4 WAY LG BORE MALE ST (IV SETS) IMPLANT
SUT PROLENE 5 0 C 1 24 (SUTURE) ×2 IMPLANT
SUT PROLENE 6 0 CC (SUTURE) ×4 IMPLANT
SUT PROLENE 7 0 BV 1 (SUTURE) ×4 IMPLANT
SUT PROLENE 7 0 BV1 MDA (SUTURE) IMPLANT
SUT SILK 2 0 FS (SUTURE) IMPLANT
SUT SILK 2 0 SH (SUTURE) ×2 IMPLANT
SUT SILK 3 0 (SUTURE) ×1
SUT SILK 3-0 18XBRD TIE 12 (SUTURE) ×1 IMPLANT
SUT VIC AB 2-0 CT1 27 (SUTURE) ×2
SUT VIC AB 2-0 CT1 TAPERPNT 27 (SUTURE) ×2 IMPLANT
SUT VIC AB 2-0 CTX 36 (SUTURE) IMPLANT
SUT VIC AB 3-0 SH 27 (SUTURE) ×5
SUT VIC AB 3-0 SH 27X BRD (SUTURE) ×5 IMPLANT
SUT VICRYL 4-0 PS2 18IN ABS (SUTURE) ×8 IMPLANT
TAPE UMBILICAL COTTON 1/8X30 (MISCELLANEOUS) ×2 IMPLANT
TOWEL OR 17X24 6PK STRL BLUE (TOWEL DISPOSABLE) ×6 IMPLANT
TOWEL OR 17X26 10 PK STRL BLUE (TOWEL DISPOSABLE) ×4 IMPLANT
TRAY FOLEY CATH 14FRSI W/METER (CATHETERS) ×2 IMPLANT
TUBING EXTENTION W/L.L. (IV SETS) IMPLANT
UNDERPAD 30X30 INCONTINENT (UNDERPADS AND DIAPERS) ×2 IMPLANT
WATER STERILE IRR 1000ML POUR (IV SOLUTION) ×2 IMPLANT

## 2011-12-23 NOTE — Anesthesia Postprocedure Evaluation (Signed)
  Anesthesia Post-op Note  Patient: Jeremy Howell  Procedure(s) Performed: Procedure(s) (LRB) with comments: BYPASS GRAFT FEMORAL-TIBIAL ARTERY (Right) - Right Popliteal - Tibial Artery Bypass using Reversed Saphenous vein  Patient Location: PACU  Anesthesia Type:General  Level of Consciousness: awake, alert  and oriented  Airway and Oxygen Therapy: Patient Spontanous Breathing and Patient connected to nasal cannula oxygen  Post-op Pain: mild  Post-op Assessment: Post-op Vital signs reviewed and Patient's Cardiovascular Status Stable  Post-op Vital Signs: stable  Complications: No apparent anesthesia complications

## 2011-12-23 NOTE — Progress Notes (Signed)
MEDICATION RELATED CONSULT NOTE  Pharmacy: Renal dose adjustment of Antibiotics  Pt. Getting 2 doses of post-op antibiotics.  Renal function is normal and an estimated clearance > 20ml/min.  Plan:  No adjustments needed.   Allergies  Allergen Reactions  . Almond Oil Shortness Of Breath and Swelling   Vital Signs: Temp: 98 F (36.7 C) (11/13 1758) BP: 149/80 mmHg (11/13 1945) Pulse Rate: 50  (11/13 1945) Intake/Output from previous day:   Intake/Output from this shift:    Labs:  Basename 12/22/11 1144  WBC 4.3  HGB 13.2  HCT 41.1  PLT 389  APTT 30  CREATININE 0.84  LABCREA --  CREATININE 0.84  CREAT24HRUR --  MG --  PHOS --  ALBUMIN 3.5  PROT 8.3  ALBUMIN 3.5  AST 23  ALT 33  ALKPHOS 93  BILITOT 0.2*  BILIDIR --  IBILI --   The CrCl is unknown because both a height and weight (above a minimum accepted value) are required for this calculation.   Microbiology: Recent Results (from the past 720 hour(s))  SURGICAL PCR SCREEN     Status: Normal   Collection Time   12/22/11 11:37 AM      Component Value Range Status Comment   MRSA, PCR NEGATIVE  NEGATIVE Final    Staphylococcus aureus NEGATIVE  NEGATIVE Final     Nadara Mustard, PharmD., MS Clinical Pharmacist Pager:  [phone] Thank you for allowing pharmacy to be part of this patients care team. 12/23/2011,8:34 PM

## 2011-12-23 NOTE — H&P (View-Only) (Signed)
[facility] [place] History and Physical 12/10/2011 DOB: [birth year] MRN : [id number]     History of Present Illness: 56 year old male patient of Dr. Darrick Penna seen for preoperative H&P due to the fact he was hospitalized 10/22/2011 with pneumonia. The patient was taken care of by [facility] while hospitalized. The patient is seen with a caregiver from his current residence facility who states he continues have PICC line, but is no longer on IV antibioticsThe patient is no longer on O2 oxygen. The patient is chronically dyspneic but does not appear to be short of breath today and is able to provide history.   Past Medical History  Diagnosis Date  . Cardiomyopathy     EF 20-25% by echo 01/2010 with severe LVH felt secondary to substance abuse (no ischemic workup)  . 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 Diabetes mellitus   . HTN (hypertension)   . Polysubstance abuse     Reported hx of cocaine, THC, heroin, EtOH abuse  . Noncompliance   . Acute renal failure   . Peripheral vascular disease     Past Surgical History  Procedure Date  . Total hip arthroplasty   . Amputation 08/15/2011    Procedure: AMPUTATION ABOVE KNEE;  Surgeon: Sherren Kerns, MD;  Location: [facility] OR;  Service: Vascular;  Laterality: Left;  Amputation End:  0855     ROS: [x]  Positive  [ ]  Denies    General: [ ]  Weight loss, [ ]  Fever, [ ]  chills Neurologic: [ ]  Dizziness, [ ]  Blackouts, [ ]  Seizure [ ]  Stroke, [ ]  "Mini stroke", [ ]  Slurred speech, [ ]  Temporary blindness; [x ] weakness in arms or legs due to stroke, [ ]  Hoarseness Cardiac: [ ]  Chest pain/pressure, [ ]  Shortness of breath at rest [ ]  Shortness of breath with exertion, [ ]  Atrial fibrillation or irregular heartbeat Vascular: [ ]  Pain in legs with walking, [x ] Pain in legs at rest right foot, [ ]  Pain in legs at night,  [ ]  Non-healing ulcer, [ ]  Blood  clot in vein/DVT,   Pulmonary: [ ]  Home oxygen, [ ]  Productive cough, [ ]  Coughing up blood, [ ]  Asthma,  [ ]  Wheezing Musculoskeletal:  [ ]  Arthritis, [ ]  Low back pain, [ ]  Joint pain Hematologic: [ ]  Easy Bruising, [ ]  Anemia; [ ]  Hepatitis Gastrointestinal: [ ]  Blood in stool, [ ]  Gastroesophageal Reflux/heartburn, [ ]  Trouble swallowing Urinary: [ ]  chronic Kidney disease, [ ]  on HD - [ ]  MWF or [ ]  TTHS, [ ]  Burning with urination, [ ]  Difficulty urinating Skin: [ ]  Rashes, [ ]  Wounds Psychological: [ ]  Anxiety, [ ]  Depression  Social History History  Substance Use Topics  . Smoking status: Former Smoker -- 15 years    Types: Cigarettes  . Smokeless tobacco: Current User   Comment: declined  . Alcohol Use: 1.0 oz/week    2 drink(s) per week    Family History Family History  Problem Relation Age of Onset  . Stroke Brother     Allergies  Allergen Reactions  . Almond Oil Shortness Of Breath and Swelling    Current Outpatient Prescriptions  Medication Sig Dispense Refill  . amLODipine (NORVASC) 10 MG tablet Take 10 mg by mouth daily.      Marland Kitchen aspirin 325 MG tablet Take 325 mg by mouth daily.      Marland Kitchen  carvedilol (COREG) 25 MG tablet Take 25 mg by mouth 2 (two) times daily with a meal.      . ceFAZolin (ANCEF) 2-3 GM-% SOLR Inject 50 mLs (2 g total) into the vein every 8 (eight) hours.  1 each  99  . folic acid (FOLVITE) 1 MG tablet Take 1 mg by mouth daily.      . food thickener (THICK IT) POWD Take 6 g by mouth as needed.  4536 g  99  . insulin aspart (NOVOLOG) 100 UNIT/ML injection Inject 3 Units into the skin 4 (four) times daily - after meals and at bedtime. If CBG > 150.      Marland Kitchen insulin glargine (LANTUS) 100 UNIT/ML injection Inject 5 Units into the skin at bedtime.      . magnesium hydroxide (MILK OF MAGNESIA) 400 MG/5ML suspension Take 30 mLs by mouth daily as needed. For constipation.      . nicotine (NICODERM CQ - DOSED IN MG/24 HOURS) 21 mg/24hr patch Place 1 patch  onto the skin daily.      Marland Kitchen ofloxacin (OCUFLOX) 0.3 % ophthalmic solution Place 2 drops into both eyes 4 (four) times daily.      . pravastatin (PRAVACHOL) 40 MG tablet          Imaging: No results found.  Significant Diagnostic Studies: CBC Lab Results  Component Value Date   WBC 6.7 10/27/2011   HGB 11.6* 10/27/2011   HCT 35.5* 10/27/2011   MCV 78.9 10/27/2011   PLT 276 10/27/2011    BMET    Component Value Date/Time   NA 138 10/29/2011 0500   K 3.5 10/29/2011 0500   CL 102 10/29/2011 0500   CO2 29 10/29/2011 0500   GLUCOSE 103* 10/29/2011 0500   BUN 9 10/29/2011 0500   CREATININE 0.74 10/29/2011 0500   CALCIUM 9.0 10/29/2011 0500   GFRNONAA >90 10/29/2011 0500   GFRAA >90 10/29/2011 0500    COAG Lab Results  Component Value Date   INR 1.00 10/22/2011   INR 1.21 08/15/2011   INR 1.24 08/14/2011   No results found for this basename: PTT     Physical Examination BP Readings from Last 3 Encounters:  12/10/11 137/80  11/26/11 157/92  11/19/11 115/65   Temp Readings from Last 3 Encounters:  10/29/11 97.8 F (36.6 C) Oral  10/16/11 98.4 F (36.9 C)   10/16/11 98.4 F (36.9 C)    SpO2 Readings from Last 3 Encounters:  12/10/11 100%  11/26/11 100%  10/29/11 94%   Pulse Readings from Last 3 Encounters:  12/10/11 66  11/26/11 66  11/19/11 59  General: WDWN in NAD  Gait: Normal  HENT: WNL  Eyes: Pupils equal  Pulmonary: normal non-labored breathing , without Rales, rhonchi, wheezing  Cardiac: RRR, without Murmurs, rubs or gallops;  No carotid bruits  Abdomen: soft, NT, no masses  Skin: no rashes, ulcers noted  Vascular Exam/Pulses: Right lower extremity pulses are not palpable, Extremities: 2+ femoral pulses bilaterally, well-healed left above-knee amputation staples removed today, popliteal and pedal pulses non-palpable right foot  no carotid bruits are heard  Extremities without ischemic changes, no Gangrene , no cellulitis; no open wounds;  Musculoskeletal: right  sided muscle wasting or atrophy due to stroke history Neurologic: A&O X 3; Appropriate Affect ; SENSATION: normal; MOTOR FUNCTION: moving all extremitiesSpeech is fluent/normal  Non ambulatory uses WC for mobility.    Non-Invasive Vascular Imaging:  ASSESSMENT/PLAN:The patient will be scheduled for a right femoral to  posterior tibial artery bypass 12-22-11 The main risk is lung function due to be intubated during the surgery.      History and exam details as above. The patient overall has recovered from his pneumonia. He has multiple comorbidities but is hopeful to salvage his remaining extremity. Risks benefits possible complications and procedure details were discussed with the patient and his family today. Posterior tibial bypass is scheduled for November 12.  [name], MD [facility] [place] Office: [phone] Pager: [phone]

## 2011-12-23 NOTE — Op Note (Signed)
Procedure: Right above-knee popliteal to posterior tibial artery bypass with reversed right greater saphenous vein  Preoperative diagnosis: Rest pain right foot  Postoperative diagnosis: Same   Anesthesia: Gen.  Assistant: Doreatha Massed PAC, Della Goo PA-C  Operative findings: #1 reversed vein configuration #2 reversed greater saphenous vein approximately 3 mm  Operative details: After obtaining informed consent, the patient was taken to the operating room. The patient was placed in supine position on the operating room table. After induction of general anesthesia and endotracheal intubation a Foley catheter was placed. Next the patient's entire right lower extremity was prepped and draped in the usual sterile fashion. A longitudinal incision was made in the right groin and carried down through the subcutaneous tissues down to level the right common femoral artery. The anterior surface was soft. Next the greater saphenous vein was harvested through the medial portion incision. The vein was followed through several skip incisions down the medial aspect of the right leg. Preoperative vein mapping had shown that the vein became small at the level of the knee. This was verified as the vein became quite small below the level of the knee. At this point an additional longitudinal incision was made in the right mid calf region on the medial aspect. This was carried through the subcutaneous tissues down through the fascia and the soleus fibers were divided with cautery. The posterior tibial artery was dissected free circumferentially. It was fairly small approximately 1.5 mm. It was soft in character. 2 vessel loops were placed proximal and distal on the artery. Attention was then turned back to the saphenectomy incision on the medial aspect just above the knee. The incision was deepened down to the level of the sartorius muscle. The sartorius muscle was reflected posteriorly. The above-knee popliteal  space was entered and the popliteal artery was dissected free circumferentially. This was soft in character. It had a good pulse within it. The arteriogram was reviewed. There was approximately a 40-50% stenosis of the superficial femoral artery proximally. This did not seem to have hemodynamic significance on inspection of the popliteal artery by palpation and a good quality of pulse. I decided the above-knee popliteal artery would be a good proximal anastomotic site. This was also decided due to the poor quality of the saphenous vein that would not have enough length to go from the groin all the way to the mid posterior tibial artery. A subfascial tunnel was created connecting the posterior tibial artery incision to the above-knee popliteal incision. The saphenous vein was ligated distally with a 2-0 silk tie at the saphenofemoral junction with additional 2-0 silk tie and the vein was transected on both ends. This was then placed in reversed configuration and gently distended with heparinized saline. All side branches had been previously ligated with clips or silk ties. These were inspected and found to be hemostatic. The patient was given 7000 units of intravenous heparin. After 2 minutes of circulation time, the above-knee popliteal artery was clamped proximally and distally with fistula clamps. A longitudinal opening was made in the above-knee popliteal artery with a 15 blade. The arteriotomy was extended with Potts scissors. The vein was placed in reversed configuration and spatulated. The vein was then sewn end of vein to side of artery using a running 6-0 Prolene suture. Just prior to completion of the anastomosis this was forebled backbled and thoroughly flushed. The anastomosis was secured and clamps release.  There is excellent pulsatile flow in the graft immediately. This was then marked for  orientation. It was then brought through the subcutaneous tunnel down to the level of the posterior tibial artery.  The vein graft was again inspected and still had excellent pulsatile flow through it to make sure that there were no twists. The posterior tibial artery was then controlled proximally and distally with fine bulldog clamps. A longitudinal opening was made in the posterior tibial artery with a 15 blade. The arteriotomy was extended with Potts scissors. The leg was then fully extended and the vein graft cut to length and spatulated. The vein was then sewn end of vein to side of artery using a running 6-0 Prolene suture. Just prior to completion of the anastomosis this was forebled backbled and thoroughly flushed the anastomosis was secured.  The clamps were released and there was pulsatile flow in the posterior tibial artery immediately. This augmented approximately 75% with unclamping of the graft. There was also a good posterior tibial Doppler signal at the ankle. There was a vaguely palpable posterior tibial pulse. An arteriogram was not performed to the patient's prior history of renal insufficiency. Next hemostasis was obtained in all incisions. Both anastomoses were inspected and found to be hemostatic. The subcutaneous tissues of all incisions were closed with a running 3-0 Vicryl suture. The right groin incision was closed with a 4 0 Vicryl subcuticular stitch in the skin. All the other incisions were closed at the skin level with staples. The patient tolerated the procedure well and there were no complications. The instrument sponge and needle count was correct at the end of the case. The patient was taken to the recovery room in stable condition.  [name], MD [facility] [place] Office: [phone] Pager: [phone]

## 2011-12-23 NOTE — Preoperative (Signed)
Beta Blockers   Reason not to administer Beta Blockers:Not Applicable 

## 2011-12-23 NOTE — Transfer of Care (Signed)
Immediate Anesthesia Transfer of Care Note  Patient: Jeremy Howell  Procedure(s) Performed: Procedure(s) (LRB) with comments: BYPASS GRAFT FEMORAL-TIBIAL ARTERY (Right) - Right Popliteal - Tibial Artery Bypass using Reversed Saphenous vein  Patient Location: PACU  Anesthesia Type:General  Level of Consciousness: awake  Airway & Oxygen Therapy: Patient Spontanous Breathing and Patient connected to face mask oxygen  Post-op Assessment: Report given to PACU RN and Post -op Vital signs reviewed and stable  Post vital signs: Reviewed and stable  Complications: No apparent anesthesia complications

## 2011-12-23 NOTE — Interval H&P Note (Signed)
History and Physical Interval Note:  12/23/2011 1:02 PM  Jeremy Howell  has presented today for surgery, with the diagnosis of Peripheral Arterial Disease  The various methods of treatment have been discussed with the patient and family. After consideration of risks, benefits and other options for treatment, the patient has consented to  Procedure(s) (LRB) with comments: BYPASS GRAFT FEMORAL-TIBIAL ARTERY (Right) - Right Femoral - Posterior Tibial Artery Bypass as a surgical intervention .  The patient's history has been reviewed, patient examined, no change in status, stable for surgery.  I have reviewed the patient's chart and labs.  Questions were answered to the patient's satisfaction.     [name],[name]  Pt for Right fem PT bypass today.  Right greater saphenous adequate at least to the knee.  Pt has rest pain right foot.  He wishes to proceed.  [name], MD [facility] [place] Office: [phone] Pager: [phone]

## 2011-12-23 NOTE — Anesthesia Preprocedure Evaluation (Addendum)
Anesthesia Evaluation  Patient identified by MRN, date of birth, ID band Patient awake    Reviewed: Allergy & Precautions, H&P , NPO status , Patient's Chart, lab work & pertinent test results, reviewed documented beta blocker date and time   Airway Mallampati: II  Neck ROM: full    Dental  (+) Dental Advisory Given, Chipped, Poor Dentition and Edentulous Upper   Pulmonary          Cardiovascular hypertension, Pt. 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, EF 20-25%, negative stress test   Neuro/Psych CVA (expressive aphasia), Residual Symptoms    GI/Hepatic (+)     substance abuse  alcohol use, cocaine use, marijuana use and IV drug use,   Endo/Other  diabetes, Type 2  Renal/GU ARFRenal disease     Musculoskeletal   Abdominal   Peds  Hematology   Anesthesia Other Findings   Reproductive/Obstetrics                        Anesthesia Physical Anesthesia Plan  ASA: IV  Anesthesia Plan: General   Post-op Pain Management:    Induction: Intravenous  Airway Management Planned: Oral ETT  Additional Equipment:   Intra-op Plan:   Post-operative Plan: Extubation in OR  Informed Consent: I have reviewed the patients History and Physical, chart, labs and discussed the procedure including the risks, benefits and alternatives for the proposed anesthesia with the patient or authorized representative who has indicated his/her understanding and acceptance.   Dental advisory given and Dental Advisory Given  Plan Discussed with: Anesthesiologist, Surgeon and CRNA  Anesthesia Plan Comments:        Anesthesia Quick Evaluation

## 2011-12-24 ENCOUNTER — Encounter (HOSPITAL_COMMUNITY): Payer: Self-pay | Admitting: Cardiology

## 2011-12-24 LAB — GLUCOSE, CAPILLARY
Glucose-Capillary: 102 mg/dL — ABNORMAL HIGH (ref 70–99)
Glucose-Capillary: 79 mg/dL (ref 70–99)

## 2011-12-24 LAB — CBC
Hemoglobin: 11.6 g/dL — ABNORMAL LOW (ref 13.0–17.0)
MCHC: 32.5 g/dL (ref 30.0–36.0)
RDW: 14.6 % (ref 11.5–15.5)
WBC: 6 10*3/uL (ref 4.0–10.5)

## 2011-12-24 LAB — BASIC METABOLIC PANEL
Chloride: 98 mEq/L (ref 96–112)
GFR calc Af Amer: >90 mL/min (ref >90)
GFR calc non Af Amer: >90 mL/min (ref >90)
Potassium: 4 mEq/L (ref 3.5–5.1)
Sodium: 132 mEq/L — ABNORMAL LOW (ref 135–145)

## 2011-12-24 LAB — MRSA PCR SCREENING: MRSA by PCR: NEGATIVE

## 2011-12-24 LAB — HEMOGLOBIN A1C: Mean Plasma Glucose: 91 mg/dL (ref <117)

## 2011-12-24 NOTE — Progress Notes (Signed)
Utilization Review Completed.Jezebel Pollet T11/14/2013   

## 2011-12-24 NOTE — Evaluation (Signed)
Occupational Therapy Evaluation Patient Details Name: Jeremy Howell MRN: [id number] DOB: 11-Sep-1955 Today's Date: 12/24/2011 Time: 4098-1191 OT Time Calculation (min): 34 min  OT Assessment / Plan / Recommendation Clinical Impression  Pt admitted from [facility] for femoral-tibial artery bypass graft of R LE.  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 Pt was likely dependent in ADL and mobility PTA.  Reports using w/c for mobility and performing scooting transfer. Pt is active with OT at SNF.  Plan to defer OT to SNF.    OT Assessment  All further OT needs can be met in the next venue of care    Follow Up Recommendations  SNF    Barriers to Discharge      Equipment Recommendations  None recommended by OT    Recommendations for Other Services    Frequency       Precautions / Restrictions Precautions Precautions: Fall Restrictions Weight Bearing Restrictions: No   Pertinent Vitals/Pain     ADL  Eating/Feeding: Set up Where Assessed - Eating/Feeding: Bed level Grooming: Wash/dry hands;Set up Where Assessed - Grooming: Supine, head of bed up Upper Body Bathing: Moderate assistance Where Assessed - Upper Body Bathing: Supported sitting Lower Body Bathing: +1 Total assistance Where Assessed - Lower Body Bathing: Rolling right and/or left Upper Body Dressing: Moderate assistance Where Assessed - Upper Body Dressing: Supported sitting Lower Body Dressing: +1 Total assistance Where Assessed - Lower Body Dressing: Rolling right and/or left Transfers/Ambulation Related to ADLs: Session limited to sitting EOB today. ADL Comments: Unclear of pt's prior functioning level.    OT Diagnosis: Generalized weakness;Hemiplegia dominant side;Acute pain  OT Problem List: Impaired balance (sitting and/or standing);Impaired UE functional use;Pain;Decreased strength;Decreased range of motion OT Treatment Interventions:     OT Goals    Visit Information  Last OT Received  On: 12/24/11 Assistance Needed: +2 PT/OT Co-Evaluation/Treatment: Yes    Subjective Data  Subjective: "Everyday.'  Pt with aphasia. Patient Stated Goal: Pt unable to state.  Will return to [facility].   Prior Functioning     Home Living Available Help at Discharge: Skilled Nursing Facility Type of Home: Skilled Nursing Facility Prior Function Level of Independence: Needs assistance Needs Assistance: Bathing;Dressing;Grooming;Toileting;Transfers Bath: Unable to assess Dressing: Unable to assess Grooming: Unable to assess Toileting: Unable to assess Comments: [employer], but not able to speak to therapy staff.  Learned that pt performed a "scooting" transfer and was active with OT/PT/ST at facility. Communication Communication: Expressive difficulties Dominant Hand: Right (uses L as lead due to hemiparesis)         Vision/Perception     Cognition  Overall Cognitive Status: Difficult to assess Difficult to assess due to: Impaired communication (used paper/pen in attempt to communicate) Arousal/Alertness: Awake/alert Behavior During Session: [facility] for tasks performed    Extremity/Trunk Assessment Right Upper Extremity Assessment RUE ROM/Strength/Tone: Deficits RUE ROM/Strength/Tone Deficits: R hemiparesis--longstanding with flexor tone greater distal than proximal RUE Coordination: Deficits RUE Coordination Deficits: no functional use of R UE Left Upper Extremity Assessment LUE ROM/Strength/Tone: Within functional levels Trunk Assessment Trunk Assessment: Other exceptions (flexed posture with posterior pelvic tilt)     Mobility Bed Mobility Bed Mobility: Supine to Sit;Sitting - Scoot to [place] of Bed;Sit to Supine;Scooting to [facility] Supine to Sit: 2: Max assist;With rails Sitting - Scoot to Edge of Bed: 2: Max assist Sit to Supine: 1: +2 Total assist;HOB flat Sit to Supine: Patient Percentage: 40% Scooting to HOB: 1: +2 Total assist  Scooting to [facility]: Patient  Percentage: 20% Details for Bed Mobility Assistance: verbal and physical cues for technique     Shoulder Instructions     Exercise     Balance Balance Balance Assessed: Yes Static Sitting Balance Static Sitting - Balance Support: Right upper extremity supported (kept R foot up, pt with posterion and R side lean) Static Sitting - Level of Assistance: 2: Max assist Static Sitting - Comment/# of Minutes: 10   End of Session OT - End of Session Activity Tolerance: Patient limited by pain Patient left: in bed;with call bell/phone within reach Nurse Communication: Mobility status;Patient requests pain meds  GO     Evern Bio 12/24/2011, 12:43 PM [phone]

## 2011-12-24 NOTE — Progress Notes (Addendum)
[facility] [place]  Progress Note Bypass Surgery  Date of Surgery: 12/23/2011  Procedure(s): BYPASS GRAFT FEMORAL-TIBIAL ARTERY Surgeon: Surgeon(s): Sherren Kerns, MD  1 Day Post-Op  History of Present Illness  Jeremy Howell is a 56 y.o. male who is S/P Procedure(s): BYPASS GRAFT FEMORAL-TIBIAL ARTERY right.  The patient's pre-op symptoms of pain are Improved . Patients pain is well controlled.    VASC. LAB Studies:        ABI: pending   Imaging: No results found.  Significant Diagnostic Studies: CBC Lab Results  Component Value Date   WBC 6.0 12/24/2011   HGB 11.6* 12/24/2011   HCT 35.7* 12/24/2011   MCV 79.3 12/24/2011   PLT 334 12/24/2011    BMET     Component Value Date/Time   NA 132* 12/24/2011 0440   K 4.0 12/24/2011 0440   CL 98 12/24/2011 0440   CO2 23 12/24/2011 0440   GLUCOSE 79 12/24/2011 0440   BUN 13 12/24/2011 0440   CREATININE 0.90 12/24/2011 0440   CALCIUM 9.4 12/24/2011 0440   GFRNONAA >90 12/24/2011 0440   GFRAA >90 12/24/2011 0440    COAG Lab Results  Component Value Date   INR 1.03 12/22/2011   INR 1.00 10/22/2011   INR 1.21 08/15/2011   No results found for this basename: PTT    Physical Examination  BP Readings from Last 3 Encounters:  12/24/11 143/78  12/24/11 143/78  12/22/11 120/87   Temp Readings from Last 3 Encounters:  12/24/11 98.8 F (37.1 C) Oral  12/24/11 98.8 F (37.1 C) Oral  12/22/11 98.4 F (36.9 C)    SpO2 Readings from Last 3 Encounters:  12/24/11 98%  12/24/11 98%  12/22/11 99%   Pulse Readings from Last 3 Encounters:  12/24/11 77  12/24/11 77  12/22/11 70    Pt is A&O x 3 right lower extremity: Incision/s is/are clean,dry.intact, and  clean, dry, intact without hematoma, erythema or drainage Limb is warm; with good color  Right Dorsalis Pedis pulse is monophasic by Doppler RightPosterior tibial pulse is  monophasic by Doppler Left AKA   Assessment/Plan: Pt.  Doing well Post-op pain is controlled Wounds are clean, dry, intact PT/OT for ambulation Transfer to [address]  [address] Gallant [facility] [phone] 12/24/2011 7:27 AM   No hematoma Right foot warm brisk PT doppler biphasic to triphasic OOB Care mgmt to review. Should be able to go to 2000 Hopefully home 1-2 days  [name], MD [facility] [place] Office: [phone] Pager: [phone]

## 2011-12-24 NOTE — Evaluation (Signed)
Physical Therapy Evaluation Patient Details Name: Jeremy Howell MRN: [id number] DOB: [birth year] Today's Date: 12/24/2011 Time: 4098-1191 PT Time Calculation (min): 31 min  PT Assessment / Plan / Recommendation Clinical Impression  Jeremy Howell is 56 y/o with history of CVA with right sided deficits and aphasia who is s/p femoral-tibial bypass graft on RLE. Has been residing at [facility] since his CVA but has had limited progress with therapies because of RLE pain. Presents to PT today with limited mobility secondary to pain, weakness and ?cognitive/communication deficits. Will benefit physical therapy in the acute setting to maxmize mobility so as to decrease BOC at next venue. Agree with return to SNF.     PT Assessment  Patient needs continued PT services    Follow Up Recommendations  SNF    Does the patient have the potential to tolerate intense rehabilitation      Barriers to Discharge        Equipment Recommendations  None recommended by PT    Recommendations for Other Services     Frequency Min 2X/week    Precautions / Restrictions Precautions Precautions: Fall Restrictions Weight Bearing Restrictions: No   Pertinent Vitals/Pain Obvious pain in RLE with mobility      Mobility  Bed Mobility Bed Mobility: Supine to Sit;Sitting - Scoot to [place] of Bed;Sit to Supine;Scooting to [facility] Supine to Sit: 2: Max assist;With rails Sitting - Scoot to [place] of Bed: 2: Max assist Sit to Supine: 1: +2 Total assist;HOB flat Sit to Supine: Patient Percentage: 40% Scooting to HOB: 1: +2 Total assist Scooting to [facility]: Patient Percentage: 20% Details for Bed Mobility Assistance: verbal and physical cues for technique Transfers Transfers: Not assessed              PT Diagnosis: Generalized weakness;Acute pain;Hemiplegia dominant side  PT Problem List: Decreased strength;Decreased activity tolerance;Decreased balance;Decreased mobility;Decreased cognition;Pain;Decreased skin  integrity PT Treatment Interventions: DME instruction;Functional mobility training;Therapeutic activities;Wheelchair mobility training;Therapeutic exercise;Cognitive remediation;Neuromuscular re-education;Balance training   PT Goals Acute Rehab PT Goals PT Goal Formulation: With patient Time For Goal Achievement: 12/31/11 Potential to Achieve Goals: Good Pt will Roll Supine to Right Side: with min assist PT Goal: Rolling Supine to Right Side - Progress: Goal set today Pt will Roll Supine to Left Side: with min assist PT Goal: Rolling Supine to Left Side - Progress: Goal set today Pt will go Supine/Side to Sit: with min assist PT Goal: Supine/Side to Sit - Progress: Goal set today Pt will Sit at Edge of Bed: with min assist;3-5 min;with bilateral upper extremity support PT Goal: Sit at Edge Of Bed - Progress: Goal set today Pt will go Sit to Supine/Side: with min assist PT Goal: Sit to Supine/Side - Progress: Goal set today Pt will Transfer Bed to Chair/Chair to Bed: with mod assist (scooting) PT Transfer Goal: Bed to Chair/Chair to Bed - Progress: Goal set today  Visit Information  Assistance Needed: +2    Subjective Data  Subjective: Everywhere! (pt aphasic and trying to tell us something about his legs)   Prior Functioning  Home Living Available Help at Discharge: Skilled Nursing Facility Type of Home: Skilled Nursing Facility Prior Function Level of Independence: Needs assistance Needs Assistance: Bathing;Dressing;Grooming;Toileting;Transfers Bath: Unable to assess Dressing: Unable to assess Grooming: Unable to assess Toileting: Unable to assess Comments: [employer], but not able to speak to therapy staff.  Learned that pt performed a "scooting" transfer and was active with OT/PT/ST at facility. Communication Communication: Expressive difficulties Dominant Hand: Right (uses  L as lead due to hemiparesis)    Cognition  Overall Cognitive Status: Difficult to  assess Difficult to assess due to: Impaired communication (used paper/pen in attempt to communicate) Arousal/Alertness: Awake/alert Behavior During Session: [employer] [facility] for tasks performed    Extremity/Trunk Assessment Right Upper Extremity Assessment RUE ROM/Strength/Tone: Deficits RUE ROM/Strength/Tone Deficits: R hemiparesis--longstanding with flexor tone greater distal than proximal RUE Coordination: Deficits RUE Coordination Deficits: no functional use of R UE Left Upper Extremity Assessment LUE ROM/Strength/Tone: Within functional levels Right Lower Extremity Assessment RLE ROM/Strength/Tone: Deficits;Unable to fully assess;Due to pain RLE ROM/Strength/Tone Deficits: pt able to perform SLR against gravity with slight extensor lag (question if there is slight knee flexion contracture); able to functionally hold his leg up during bed mobility because of pain RLE Sensation: WFL - Light Touch Left Lower Extremity Assessment LLE ROM/Strength/Tone: Deficits LLE ROM/Strength/Tone Deficits: L AKE, pt able to perform hip flexion grossly 3/5 LLE Sensation: WFL - Light Touch Trunk Assessment Trunk Assessment: Other exceptions (flexed posture with posterior pelvic tilt)   Balance Balance Balance Assessed: Yes Static Sitting Balance Static Sitting - Balance Support: Right upper extremity supported (kept R foot up, pt with posterion and R side lean) Static Sitting - Level of Assistance: 2: Max assist Static Sitting - Comment/# of Minutes: 10, facilitation for anterior translation of trunk and lateral lean bilaterally Dynamic Sitting Balance Dynamic Sitting - Balance Support: Bilateral upper extremity supported Dynamic Sitting - Level of Assistance: 4: Min assist Dynamic Sitting - Comments: pt performing bilateral leaning/weight shift with minA but heavy posterior pelvic tilt with significant kyphosis and no lordodic curve  End of Session PT - End of Session Activity Tolerance: Patient limited by  pain (difficulty with communication) Patient left: in bed;with call bell/phone within reach;with bed alarm set Nurse Communication: Mobility status  GP     [facility] HELEN 12/24/2011, 1:02 PM

## 2011-12-25 ENCOUNTER — Encounter (HOSPITAL_COMMUNITY): Payer: Self-pay | Admitting: Vascular Surgery

## 2011-12-25 DIAGNOSIS — Z48812 Encounter for surgical aftercare following surgery on the circulatory system: Secondary | ICD-10-CM

## 2011-12-25 LAB — GLUCOSE, CAPILLARY
Glucose-Capillary: 97 mg/dL (ref 70–99)
Glucose-Capillary: 97 mg/dL (ref 70–99)

## 2011-12-25 MED ORDER — ENOXAPARIN SODIUM 40 MG/0.4ML ~~LOC~~ SOLN
40.0000 mg | SUBCUTANEOUS | Status: DC
  Administered 2011-12-25 – 2011-12-27 (×3): 40 mg via SUBCUTANEOUS
  Filled 2011-12-25 (×4): qty 0.4

## 2011-12-25 NOTE — Progress Notes (Addendum)
[facility] Progress Note  12/25/2011 7:56 AM POD 2  Subjective:  No complaints.  VSS Filed Vitals:   12/25/11 0506  BP: 102/61  Pulse:   Temp: 98 F (36.7 C)  Resp:     Physical Exam: Incisions:  All incisions are c/d/i with staples in tact.  Right groin c/d/i without hematoma. Extremities:  Right foot warm and well perfused.  CBC    Component Value Date/Time   WBC 6.0 12/24/2011 0440   RBC 4.50 12/24/2011 0440   HGB 11.6* 12/24/2011 0440   HCT 35.7* 12/24/2011 0440   PLT 334 12/24/2011 0440   MCV 79.3 12/24/2011 0440   MCH 25.8* 12/24/2011 0440   MCHC 32.5 12/24/2011 0440   RDW 14.6 12/24/2011 0440   LYMPHSABS 0.8 10/24/2011 0600   MONOABS 0.4 10/24/2011 0600   EOSABS 0.1 10/24/2011 0600   BASOSABS 0.0 10/24/2011 0600    BMET    Component Value Date/Time   NA 132* 12/24/2011 0440   K 4.0 12/24/2011 0440   CL 98 12/24/2011 0440   CO2 23 12/24/2011 0440   GLUCOSE 79 12/24/2011 0440   BUN 13 12/24/2011 0440   CREATININE 0.90 12/24/2011 0440   CALCIUM 9.4 12/24/2011 0440   GFRNONAA >90 12/24/2011 0440   GFRAA >90 12/24/2011 0440    INR    Component Value Date/Time   INR 1.03 12/22/2011 1144     Intake/Output Summary (Last 24 hours) at 12/25/11 0756 Last data filed at 12/24/11 1728  Gross per 24 hour  Intake    530 ml  Output   1075 ml  Net   -545 ml     Assessment/Plan:  56 y.o. male is s/p Right above-knee popliteal to posterior tibial artery bypass with reversed right greater saphenous vein  POD 2  -doing well this am. -mobilize/OOB to chair tid with meals. -social work consult for discharge planning  Doreatha Massed, [place] [facility] [phone] 12/25/2011 7:56 AM  Addendum  I have independently interviewed and examined the patient, and I agree with the physician assistant's findings.  Inc c/d/i.  Augmentation in RLE ABI noted.  Awaiting SNF placement.  Leonides Sake, MD [facility] [place] Office: [phone] Pager: [phone]  12/25/2011, 1:49 PM

## 2011-12-25 NOTE — Addendum Note (Signed)
Addendum  created 12/25/11 0625 by Gibbs Naugle F Kynzee Devinney, CRNA   Modules edited:Anesthesia Medication Administration    

## 2011-12-25 NOTE — Progress Notes (Signed)
VASCULAR LAB PRELIMINARY  ARTERIAL  ABI completed:    RIGHT    LEFT    PRESSURE WAVEFORM  PRESSURE WAVEFORM  BRACHIAL 96 Triphasic  BRACHIAL 104 Triphasic   DP 88 Monophasic  DP    AT   AT    PT 88 Monophasic  PT    PER   PER    GREAT TOE  NA GREAT TOE  NA    RIGHT LEFT  ABI 0.85 amputation     Iyesha Such, RVT 12/25/2011, 9:11 AM

## 2011-12-25 NOTE — Clinical Documentation Improvement (Signed)
GENERIC DOCUMENTATION CLARIFICATION QUERY  THIS DOCUMENT IS NOT A PERMANENT PART OF THE MEDICAL RECORD  TO RESPOND TO THE THIS QUERY, FOLLOW THE INSTRUCTIONS BELOW:  1. If needed, update documentation for the patient's encounter via the notes activity.  2. Access this query again and click edit on the In [employer].  3. After updating, or not, click F2 to complete all highlighted (required) Tristine Langi concerning your review. Select "additional documentation in the medical record" OR "no additional documentation provided".  4. Click Sign note button.  5. The deficiency will fall out of your In Basket *Please let us know if you are not able to complete this workflow by phone or e-mail (listed below).  Please update your documentation within the medical record to reflect your response to this query.                                                                                        12/25/11   Dear Dr. [name] / Associates,  In a better effort to capture your patient's severity of illness, reflect appropriate length of stay and utilization of resources, a review of the patient medical record has revealed the following indicators.    Based on your clinical judgment, please clarify and document in a progress note and/or discharge summary the clinical condition associated with the following supporting information:  In responding to this query please exercise your independent judgment.  The fact that a query is asked, does not imply that any particular answer is desired or expected.  Dr. 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  Per preop H + P "right sided muscle wasting or atrophy due to stroke history" is documented.  Is  this secondary diagnosis appropriate for this patient?   Thank you  .     Possible Clinical Conditions?  Rt hemiparesis  Other Condition  Cannot Clinically Determine    You may use possible, probable, or suspect with inpatient documentation. possible, probable, suspected diagnoses MUST be documented at the time of discharge  Reviewed: additional documentation in the medical record  Thank You,  Lavonda Jumbo  Clinical Documentation Specialist RN, BSN: Pager [phone]  //HIM off # [phone]   Health Information Management [place]  Corrected in chart  [name]

## 2011-12-25 NOTE — Care Management Note (Signed)
    Page 1 of 1   12/28/2011     4:29:13 PM   CARE MANAGEMENT NOTE 12/28/2011  Patient:  Jeremy Howell, Jeremy Howell   Account Number:  [id number]  Date Initiated:  12/25/2011  Documentation initiated by:  Ellenora Talton  Subjective/Objective Assessment:   PT ADM ON 12/23/11 S/P RT FEM TIB BYPASS.  PTA, PT RESIDES AT [facility] FACILITY.     Action/Plan:   CSW CONSULTED TO FACILITATE RETURN TO SNF WHEN MEDICALLY STABLE FOR DISCHARGE.  WILL FOLLOW/ASSIST AS NEEDED.   Anticipated DC Date:  12/27/2011   Anticipated DC Plan:  SKILLED NURSING FACILITY  In-house referral  Clinical Social Worker      DC Planning Services  CM consult      Choice offered to / List presented to:             Status of service:  Completed, signed off Medicare Important Message given?   (If response is "NO", the following Medicare IM given date fields will be blank) Date Medicare IM given:   Date Additional Medicare IM given:    Discharge Disposition:  SKILLED NURSING FACILITY  Per UR Regulation:  Reviewed for med. necessity/level of care/duration of stay  If discussed at Long Length of Stay Meetings, dates discussed:    Comments:  12/28/11 Sidney Ace, RN,BSN [phone] PT DISCHARGED BACK TO [facility], PER CSW ARRANGEMENTS.

## 2011-12-25 NOTE — Addendum Note (Signed)
Addendum  created 12/25/11 1610 by Carmela Rima, CRNA   Modules edited:Anesthesia Medication Administration

## 2011-12-25 NOTE — Clinical Social Work Psychosocial (Signed)
Clinical Social Work Department BRIEF PSYCHOSOCIAL ASSESSMENT 12/25/2011  Patient:  Jeremy Howell, Jeremy Howell     Account Number:  [id number]     Admit date:  12/23/2011  Clinical Social Worker:  Thomasene Mohair  Date/Time:  12/25/2011 12:00 N  Referred by:  RN  Date Referred:  12/25/2011 Referred for  SNF Placement   Other Referral:   Interview type:  Patient Other interview type:    PSYCHOSOCIAL DATA Living Status:  FACILITY Admitted from facility:  [facility] Level of care:  Skilled Nursing Facility Primary support name:  Bleu Reichel Primary support relationship to patient:  CHILD, ADULT Degree of support available:   adequate    CURRENT CONCERNS Current Concerns  Post-Acute Placement   Other Concerns:    SOCIAL WORK ASSESSMENT / PLAN CSW was referred to pt to assist with dc planning. Pt is a long-term snf resident at [facility].  Pt is planning to return at dc but did not hold a bed at the facility.  CSW contacted SNF and there are no beds until Monday.  CSW will notifiy physician and follow up as Pt's care progresses.   Assessment/plan status:  Psychosocial Support/Ongoing Assessment of Needs Other assessment/ plan:   Information/referral to community resources:    PATIENT'S/FAMILY'S RESPONSE TO PLAN OF CARE: Pt is long-term resident at [facility] and would like to return at dc. Pt did not hold his bed and there are no beds available until Monday. No family at bedside at this time.   Frederico Hamman, LCSW [phone]

## 2011-12-26 LAB — GLUCOSE, CAPILLARY
Glucose-Capillary: 82 mg/dL (ref 70–99)
Glucose-Capillary: 96 mg/dL (ref 70–99)

## 2011-12-26 NOTE — Progress Notes (Addendum)
[facility] [place]  Progress Note Bypass Surgery  Date of Surgery: 12/23/2011  Procedure(s): BYPASS GRAFT FEMORAL-TIBIAL ARTERY Surgeon: Surgeon(s): Sherren Kerns, MD  3 Days Post-Op  History of Present Illness  Jeremy Howell is a 56 y.o. male who is S/P Procedure(s): BYPASS GRAFT FEMORAL-TIBIAL ARTERY right.  The patient's pre-op symptoms of pain are Improved . Patients pain is well controlled.    VASC. LAB Studies:       ABI completed:   RIGHT    LEFT     PRESSURE  WAVEFORM   PRESSURE  WAVEFORM   BRACHIAL  96  Triphasic  BRACHIAL  104  Triphasic   DP  88  Monophasic  DP     AT    AT     PT  88  Monophasic  PT     PER    PER     GREAT TOE   NA  GREAT TOE   NA     RIGHT  LEFT   ABI  0.85  amputation       Imaging: No results found.  Significant Diagnostic Studies: CBC Lab Results  Component Value Date   WBC 6.0 12/24/2011   HGB 11.6* 12/24/2011   HCT 35.7* 12/24/2011   MCV 79.3 12/24/2011   PLT 334 12/24/2011    BMET     Component Value Date/Time   NA 132* 12/24/2011 0440   K 4.0 12/24/2011 0440   CL 98 12/24/2011 0440   CO2 23 12/24/2011 0440   GLUCOSE 79 12/24/2011 0440   BUN 13 12/24/2011 0440   CREATININE 0.90 12/24/2011 0440   CALCIUM 9.4 12/24/2011 0440   GFRNONAA >90 12/24/2011 0440   GFRAA >90 12/24/2011 0440    COAG Lab Results  Component Value Date   INR 1.03 12/22/2011   INR 1.00 10/22/2011   INR 1.21 08/15/2011   No results found for this basename: PTT    Physical Examination  BP Readings from Last 3 Encounters:  12/26/11 116/73  12/26/11 116/73  12/22/11 120/87   Temp Readings from Last 3 Encounters:  12/26/11 98.2 F (36.8 C) Oral  12/26/11 98.2 F (36.8 C) Oral  12/22/11 98.4 F (36.9 C)    SpO2 Readings from Last 3 Encounters:  12/26/11 97%  12/26/11 97%  12/22/11 99%   Pulse Readings from Last 3 Encounters:  12/26/11 73  12/26/11 73  12/22/11 70    Pt is A&O x 3 right lower  extremity: Incision/s is/are clean,dry.intact, and  healing without hematoma, erythema or drainage Limb is warm; with good color PT monophasic via doppler     Assessment/Plan: Pt. Doing well Post-op pain is controlled Wounds are clean, dry, intact or healing well PT/OT for ambulation Pending SNF placement when bed available possible Monday  [facility] [phone] 12/26/2011 8:35 AM   Addendum  I have independently interviewed and examined the patient, and I agree with the physician assistant's findings.  Pt is ready for d/c on SNF bed available.  Leonides Sake, MD [facility] [place] Office: [phone] Pager: [phone]  12/26/2011, 10:14 AM

## 2011-12-27 LAB — GLUCOSE, CAPILLARY
Glucose-Capillary: 84 mg/dL (ref 70–99)
Glucose-Capillary: 95 mg/dL (ref 70–99)
Glucose-Capillary: 125 mg/dL — ABNORMAL HIGH (ref 70–99)

## 2011-12-27 MED ORDER — OXYCODONE HCL 5 MG PO TABS: 5.0000 mg | ORAL_TABLET | Freq: Four times a day (QID) | ORAL | Status: DC | PRN

## 2011-12-27 MED ORDER — BISACODYL 10 MG RE SUPP: 10.0000 mg | Freq: Once | RECTAL | Status: DC

## 2011-12-27 NOTE — Progress Notes (Addendum)
[facility] Progress Note  12/27/2011 7:50 AM POD 4  Subjective:  Pt states he has not had BM in 5 days.  Tm 99.2 now afebrile VSS 98%RA  Filed Vitals:   12/27/11 0449  BP: 112/66  Pulse: 76  Temp: 98.4 F (36.9 C)  Resp: 18    Physical Exam: Incisions:  All incisions are c/d/i with staples in tact on the RLE; right groin is c/d/i with dermabond in tact.  No hematoma noted. Extremities:  Right foot is warm and well perfused.  CBC    Component Value Date/Time   WBC 6.0 12/24/2011 0440   RBC 4.50 12/24/2011 0440   HGB 11.6* 12/24/2011 0440   HCT 35.7* 12/24/2011 0440   PLT 334 12/24/2011 0440   MCV 79.3 12/24/2011 0440   MCH 25.8* 12/24/2011 0440   MCHC 32.5 12/24/2011 0440   RDW 14.6 12/24/2011 0440   LYMPHSABS 0.8 10/24/2011 0600   MONOABS 0.4 10/24/2011 0600   EOSABS 0.1 10/24/2011 0600   BASOSABS 0.0 10/24/2011 0600    BMET    Component Value Date/Time   NA 132* 12/24/2011 0440   K 4.0 12/24/2011 0440   CL 98 12/24/2011 0440   CO2 23 12/24/2011 0440   GLUCOSE 79 12/24/2011 0440   BUN 13 12/24/2011 0440   CREATININE 0.90 12/24/2011 0440   CALCIUM 9.4 12/24/2011 0440   GFRNONAA >90 12/24/2011 0440   GFRAA >90 12/24/2011 0440    INR    Component Value Date/Time   INR 1.03 12/22/2011 1144     Intake/Output Summary (Last 24 hours) at 12/27/11 0750 Last data filed at 12/27/11 0449  Gross per 24 hour  Intake      0 ml  Output    800 ml  Net   -800 ml     Assessment/Plan:  56 y.o. male is s/p Right above-knee popliteal to posterior tibial artery bypass with reversed right greater saphenous vein POD 4  -doing well-pt needs to mobilize. -will give pt dulcolax supp this am-may need fleets enema -to d/c to SNF tomorrow.  Doreatha Massed, PA-C [facility] [phone] 12/27/2011 7:50 AM  Addendum  I have independently interviewed and examined the patient, and I agree with the physician assistant's findings.   Inc c/d/i with staples.  Palpable PT today.  Awaiting SNF placement.  Leonides Sake, MD [facility] [place] Office: [phone] Pager: [phone]  12/27/2011, 8:51 AM

## 2011-12-27 NOTE — Discharge Summary (Signed)
[facility] Discharge Summary  Woodruff Skirvin 10-26-1955 56 y.o. male  [id number]  Admission Date: 12/23/2011  Discharge Date: 12/28/11  Physician: Sherren Kerns, MD  Admission Diagnosis: Peripheral Arterial Disease   HPI:   This is a 56 y.o. male patient of Dr. Darrick Penna seen for preoperative H&P due to the fact he was hospitalized 10/22/2011 with pneumonia. The patient was taken care of by [facility] while hospitalized. The patient is seen with a caregiver from his current residence facility who states he continues have PICC line, but is no longer on IV antibioticsThe patient is no longer on O2 oxygen. The patient is chronically dyspneic but does not appear to be short of breath today and is able to provide history.   Hospital Course:  The patient was admitted to the hospital and taken to the operating room on 12/23/2011 and underwent Right above-knee popliteal to posterior tibial artery bypass with reversed right greater saphenous vein.  The pt tolerated the procedure well and was transported to the PACU in good condition.  By POD 1, he was doing well with a warm right foot and brisk PT doppler signal.  He was transferred to 2000 that day.  On POD 4, he had not had BM and was given a dulcolax and did have a BM later that night.    ABI on 12/25/11 is as follows:  RIGHT    LEFT     PRESSURE  WAVEFORM   PRESSURE  WAVEFORM   BRACHIAL  96  Triphasic  BRACHIAL  104  Triphasic   DP  88  Monophasic  DP     AT    AT     PT  88  Monophasic  PT     PER    PER     GREAT TOE   NA  GREAT TOE   NA     RIGHT  LEFT   ABI  0.85  amputation     The remainder of the hospital course consisted of increasing mobilization and increasing intake of solids without difficulty.  CBC    Component Value Date/Time   WBC 6.0 12/24/2011 0440   RBC 4.50 12/24/2011 0440   HGB 11.6* 12/24/2011 0440   HCT 35.7* 12/24/2011 0440   PLT 334 12/24/2011 0440   MCV 79.3 12/24/2011  0440   MCH 25.8* 12/24/2011 0440   MCHC 32.5 12/24/2011 0440   RDW 14.6 12/24/2011 0440   LYMPHSABS 0.8 10/24/2011 0600   MONOABS 0.4 10/24/2011 0600   EOSABS 0.1 10/24/2011 0600   BASOSABS 0.0 10/24/2011 0600    BMET    Component Value Date/Time   NA 132* 12/24/2011 0440   K 4.0 12/24/2011 0440   CL 98 12/24/2011 0440   CO2 23 12/24/2011 0440   GLUCOSE 79 12/24/2011 0440   BUN 13 12/24/2011 0440   CREATININE 0.90 12/24/2011 0440   CALCIUM 9.4 12/24/2011 0440   GFRNONAA >90 12/24/2011 0440   GFRAA >90 12/24/2011 0440     Discharge Instructions:   The patient is discharged to SNF with extensive instructions on wound care and progressive ambulation.  They are instructed not to drive or perform any heavy lifting until returning to see the physician in his office.  Discharge Orders    Future Orders Please Complete By Expires   Resume previous diet      Lifting restrictions      Comments:   No lifting for 6 weeks   Call MD  for:  temperature >100.5      Call MD for:  redness, tenderness, or signs of infection (pain, swelling, bleeding, redness, odor or green/yellow discharge around incision site)      Call MD for:  severe or increased pain, loss or decreased feeling  in affected limb(s)      Discharge wound care:      Comments:   Shower daily with soap and water starting 12/28/11      Discharge Diagnosis:  Peripheral Arterial Disease  Secondary Diagnosis: Patient Active Problem List  Diagnosis  . Hypertensive emergency  . Diabetes mellitus  . 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 Aphasia  . ARF (acute renal failure)  . Gangrene of foot  . PVD (peripheral vascular disease)  . Metabolic acidosis  . History of CVA (cerebrovascular accident)  . Rhabdomyolysis  . HTN (hypertension)  . Atherosclerosis of native arteries of the extremities with gangrene  . Aftercare following surgery of the circulatory  system, NEC  . Preop cardiovascular exam  . HCAP (healthcare-associated pneumonia)  . Acute respiratory failure with hypoxia  . Dehydration  . SIRS (systemic inflammatory response syndrome)  . Streptococcal pneumonia  . Neutropenia associated with infection  . Bacteremia due to Staphylococcus aureus  . Bacteremia due to Staphylococcus  . Peripheral vascular disease, unspecified  . Preoperative examination, unspecified   Past Medical History  Diagnosis Date  . Cardiomyopathy     EF 20-25% by echo 01/2010 with severe LVH felt secondary to substance abuse (no ischemic workup)  . 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 Diabetes mellitus   . HTN (hypertension)   . Polysubstance abuse     Reported hx of cocaine, THC, heroin, EtOH abuse  . Noncompliance   . Peripheral vascular disease   . CHF (congestive heart failure)   . Pneumonia   . Dependent for walking   . Wheelchair bound   . Acute renal failure       [place], [place]  Home Medication Instructions [id number]   Printed on:12/27/11 0804  Medication Information                    carvedilol (COREG) 25 MG tablet Take 25 mg by mouth 2 (two) times daily with a meal.           magnesium hydroxide (MILK OF MAGNESIA) 400 MG/5ML suspension Take 30 mLs by mouth daily as needed. For constipation.           amLODipine (NORVASC) 10 MG tablet Take 10 mg by mouth daily.           folic acid (FOLVITE) 1 MG tablet Take 1 mg by mouth daily.           aspirin 325 MG tablet Take 325 mg by mouth daily.           nicotine (NICODERM CQ - DOSED IN MG/24 HOURS) 21 mg/24hr patch Place 1 patch onto the skin daily.           ofloxacin (OCUFLOX) 0.3 % ophthalmic solution Place 2 drops into both eyes 4 (four) times daily.           insulin aspart (NOVOLOG) 100 UNIT/ML injection Inject 3 Units into the skin 2 (two) times daily. If CBG > 150.  ceFAZolin (ANCEF)  2-3 GM-% SOLR Inject 50 mLs (2 g total) into the vein every 8 (eight) hours.           traMADol (ULTRAM) 50 MG tablet Take 50 mg by mouth 2 (two) times daily as needed. For pain           bisacodyl (DULCOLAX) 10 MG suppository Place 10 mg rectally daily as needed. If not relieved by milk of magnesium, repeat in 24 hours           lisinopril (PRINIVIL,ZESTRIL) 10 MG tablet Take 5 mg by mouth daily.           oxyCODONE (OXY IR/ROXICODONE) 5 MG immediate release tablet Take 1-2 tablets (5-10 mg total) by mouth every 6 (six) hours as needed. #30 NR            Disposition: SNF  Patient's condition: is Good  Follow up: 1. Dr. Darrick Penna in 2 weeks   Doreatha Massed, PA-C [facility] [phone] 12/27/2011  8:04 AM

## 2011-12-28 ENCOUNTER — Telehealth: Payer: Self-pay | Admitting: Vascular Surgery

## 2011-12-28 LAB — GLUCOSE, CAPILLARY: Glucose-Capillary: 97 mg/dL (ref 70–99)

## 2011-12-28 NOTE — Clinical Social Work Note (Signed)
CSW confirmed SNF bed ready at [facility]. DC paperwork sent to SNF. Pt's daughter notified of dc and new room assignment at SNF.  [facility] called for transport.   Frederico Hamman, LCSW [phone]

## 2011-12-28 NOTE — Progress Notes (Addendum)
[facility] Progress Note  12/28/2011 7:12 AM POD 5  Subjective:  No complaints.  States he had a BM.  Ready to be discharged.  Afebrile x 24 hrs VSS 99%RA  Filed Vitals:   12/28/11 0447  BP: 122/69  Pulse: 82  Temp: 97.9 F (36.6 C)  Resp: 18    Physical Exam: Incisions:  RLE incisions with staples in tact.  Right groin with dermabond in tact c/d/i. Extremities:  Right foot warm and well perfused.  CBC    Component Value Date/Time   WBC 6.0 12/24/2011 0440   RBC 4.50 12/24/2011 0440   HGB 11.6* 12/24/2011 0440   HCT 35.7* 12/24/2011 0440   PLT 334 12/24/2011 0440   MCV 79.3 12/24/2011 0440   MCH 25.8* 12/24/2011 0440   MCHC 32.5 12/24/2011 0440   RDW 14.6 12/24/2011 0440   LYMPHSABS 0.8 10/24/2011 0600   MONOABS 0.4 10/24/2011 0600   EOSABS 0.1 10/24/2011 0600   BASOSABS 0.0 10/24/2011 0600    BMET    Component Value Date/Time   NA 132* 12/24/2011 0440   K 4.0 12/24/2011 0440   CL 98 12/24/2011 0440   CO2 23 12/24/2011 0440   GLUCOSE 79 12/24/2011 0440   BUN 13 12/24/2011 0440   CREATININE 0.90 12/24/2011 0440   CALCIUM 9.4 12/24/2011 0440   GFRNONAA >90 12/24/2011 0440   GFRAA >90 12/24/2011 0440    INR    Component Value Date/Time   INR 1.03 12/22/2011 1144     Intake/Output Summary (Last 24 hours) at 12/28/11 4782 Last data filed at 12/28/11 0657  Gross per 24 hour  Intake    480 ml  Output    600 ml  Net   -120 ml     Assessment/Plan:  56 y.o. male is s/p Right above-knee popliteal to posterior tibial artery bypass with reversed right greater saphenous vein  POD 5  -doing well this am. -pt did have BM with dulcolax -for SNF today   Doreatha Massed, PA-C [facility] [phone] 12/28/2011 7:12 AM   Addendum  I have independently interviewed and examined the patient, and I agree with the physician assistant's findings.  Follow up with Dr. Darrick Penna in two weeks.  Leonides Sake, MD [facility] [place] Office: [phone] Pager: [phone]  12/28/2011, 12:39 PM

## 2011-12-28 NOTE — Telephone Encounter (Addendum)
Message copied by Shari Prows on Mon Dec 28, 2011 10:02 AM ------      Message from: [place], [place] K      Created: Mon Dec 28, 2011  8:09 AM      Regarding: schedule                   ----- Message -----         From: Dara Lords, PA         Sent: 12/27/2011   8:01 AM           To: Sharee Pimple, CMA            S/p Right above-knee popliteal to posterior tibial artery bypass with reversed right greater saphenous vein by [facility].  F/u in 2 weeks.  I  Believe he will be discharged to [facility].            Thanks,      Gennette Pac w/ Tammy @ [facility] regarding this pt's appt on Thur 01/14/12 at 9:15am w/ [facility]. awt

## 2012-01-13 ENCOUNTER — Encounter: Payer: Self-pay | Admitting: Vascular Surgery

## 2012-01-14 ENCOUNTER — Encounter: Payer: Self-pay | Admitting: Vascular Surgery

## 2012-01-14 ENCOUNTER — Ambulatory Visit (INDEPENDENT_AMBULATORY_CARE_PROVIDER_SITE_OTHER): Payer: Medicare Other | Admitting: Vascular Surgery

## 2012-01-14 VITALS — BP 122/80 | HR 86 | Ht 64.0 in | Wt 119.0 lb

## 2012-01-14 DIAGNOSIS — I739 Peripheral vascular disease, unspecified: Secondary | ICD-10-CM

## 2012-01-14 DIAGNOSIS — Z48812 Encounter for surgical aftercare following surgery on the circulatory system: Secondary | ICD-10-CM

## 2012-01-14 NOTE — Progress Notes (Signed)
Patient is a 56-year-old male who several weeks status post right above-knee to posterior tibial artery bypass with vein. He has had a previous left above-knee amputation. He reports no pain in his right foot. He has had a previous stroke so history is somewhat difficult to obtain. He is transferring on the right leg. He has had no drainage from the incisions. He did have a stenosis of the superficial femoral artery above his bypass graft but the origin of the bypass graft was placed below this due to limited conduit.  Physical exam: Filed Vitals:   01/14/12 0911  BP: 122/80  Pulse: 86  Height: 5' 4" (1.626 m)  Weight: 119 lb (53.978 kg)  SpO2: 100%   Right lower extremity: Right foot warm posterior tibial pulse not palpable all incisions well-healed  Data: Duplex of the right lower extremity today does show a superficial femoral artery stenosis with increased velocities of 275 cm/s.  Assessment: Patent bypass right leg but needs ABIs and arterial duplex today to assess hemodynamic significance of the superficial femoral artery which we will need to watch closely. We will remove all staples right leg today.  Plan: Right lower extremity arteriogram January 3 with most likely stenting of the right superficial femoral artery at that time. Risks benefits possible complications and procedure details were discussed the patient and his family member today.  Icholas Melvinia Ashby, MD [facility] [place] Office: [phone] Pager: [phone]  

## 2012-01-14 NOTE — Progress Notes (Signed)
Right lower extremity arterial duplex performed @ [facility] 01/14/2012

## 2012-01-14 NOTE — Progress Notes (Signed)
Ankle brachial index performed @ [facility] 01/14/2012

## 2012-01-14 NOTE — Addendum Note (Signed)
Addended by: Margo Aye A on: 01/14/2012 02:19 PM   Modules accepted: Orders

## 2012-01-27 ENCOUNTER — Other Ambulatory Visit: Payer: Self-pay

## 2012-02-05 ENCOUNTER — Encounter (HOSPITAL_COMMUNITY): Payer: Self-pay | Admitting: Pharmacy Technician

## 2012-02-12 ENCOUNTER — Encounter (HOSPITAL_COMMUNITY)
Admission: RE | Disposition: A | Discharge status: Home / Self Care | Payer: Self-pay | Source: Ambulatory Visit | Attending: Vascular Surgery

## 2012-02-12 ENCOUNTER — Telehealth: Payer: Self-pay | Admitting: Vascular Surgery

## 2012-02-12 ENCOUNTER — Ambulatory Visit (HOSPITAL_COMMUNITY)
Admission: RE | Admit: 2012-02-12 | Discharge: 2012-02-12 | Disposition: A | Discharge status: Home / Self Care | Payer: Medicare Other | Source: Ambulatory Visit | Attending: Vascular Surgery | Admitting: Vascular Surgery

## 2012-02-12 DIAGNOSIS — I70219 Atherosclerosis of native arteries of extremities with intermittent claudication, unspecified extremity: Secondary | ICD-10-CM | POA: Insufficient documentation

## 2012-02-12 DIAGNOSIS — S78119A Complete traumatic amputation at level between unspecified hip and knee, initial encounter: Secondary | ICD-10-CM | POA: Insufficient documentation

## 2012-02-12 DIAGNOSIS — I6990 Unspecified sequelae of unspecified cerebrovascular disease: Secondary | ICD-10-CM | POA: Insufficient documentation

## 2012-02-12 HISTORY — PX: LOWER EXTREMITY ANGIOGRAM: SHX5508

## 2012-02-12 HISTORY — PX: PERCUTANEOUS STENT INTERVENTION: SHX5500

## 2012-02-12 HISTORY — PX: ABDOMINAL ANGIOGRAM: SHX5499

## 2012-02-12 LAB — POCT ACTIVATED CLOTTING TIME
Activated Clotting Time: 187 seconds
Activated Clotting Time: 176 seconds

## 2012-02-12 LAB — POCT I-STAT, CHEM 8
Calcium, Ion: 1.29 mmol/L — ABNORMAL HIGH (ref 1.12–1.23)
Glucose, Bld: 88 mg/dL (ref 70–99)
HCT: 44 % (ref 39.0–52.0)
Hemoglobin: 15 g/dL (ref 13.0–17.0)

## 2012-02-12 SURGERY — ANGIOGRAM, LOWER EXTREMITY
Anesthesia: LOCAL

## 2012-02-12 MED ORDER — ACETAMINOPHEN 325 MG RE SUPP: 325.0000 mg | RECTAL | Status: DC | PRN

## 2012-02-12 MED ORDER — METOPROLOL TARTRATE 1 MG/ML IV SOLN: 2.0000 mg | INTRAVENOUS | Status: DC | PRN

## 2012-02-12 MED ORDER — CLOPIDOGREL BISULFATE 75 MG PO TABS: 75.0000 mg | ORAL_TABLET | Freq: Every day | ORAL | Status: DC

## 2012-02-12 MED ORDER — LABETALOL HCL 5 MG/ML IV SOLN: 10.0000 mg | INTRAVENOUS | Status: DC | PRN

## 2012-02-12 MED ORDER — DOCUSATE SODIUM 100 MG PO CAPS: 100.0000 mg | ORAL_CAPSULE | Freq: Every day | ORAL | Status: DC

## 2012-02-12 MED ORDER — HYDRALAZINE HCL 20 MG/ML IJ SOLN
10.0000 mg | INTRAMUSCULAR | Status: DC | PRN
  Administered 2012-02-12: 10 mg via INTRAVENOUS

## 2012-02-12 MED ORDER — HEPARIN SODIUM (PORCINE) 1000 UNIT/ML IJ SOLN
INTRAMUSCULAR | Status: AC
  Filled 2012-02-12: qty 1

## 2012-02-12 MED ORDER — FENTANYL CITRATE 0.05 MG/ML IJ SOLN
INTRAMUSCULAR | Status: AC
  Filled 2012-02-12: qty 2

## 2012-02-12 MED ORDER — ACETAMINOPHEN 325 MG PO TABS: 325.0000 mg | ORAL_TABLET | ORAL | Status: DC | PRN

## 2012-02-12 MED ORDER — CLOPIDOGREL BISULFATE 300 MG PO TABS
300.0000 mg | ORAL_TABLET | Freq: Once | ORAL | Status: AC
  Administered 2012-02-12: 300 mg via ORAL

## 2012-02-12 MED ORDER — PHENOL 1.4 % MT LIQD: 1.0000 | OROMUCOSAL | Status: DC | PRN

## 2012-02-12 MED ORDER — CLOPIDOGREL BISULFATE 300 MG PO TABS
ORAL_TABLET | ORAL | Status: AC
  Filled 2012-02-12: qty 1

## 2012-02-12 MED ORDER — SODIUM CHLORIDE 0.45 % IV SOLN: INTRAVENOUS | Status: DC

## 2012-02-12 MED ORDER — OXYCODONE HCL 5 MG PO TABS: 5.0000 mg | ORAL_TABLET | ORAL | Status: DC | PRN

## 2012-02-12 MED ORDER — HYDRALAZINE HCL 20 MG/ML IJ SOLN
INTRAMUSCULAR | Status: AC
  Filled 2012-02-12: qty 1

## 2012-02-12 MED ORDER — GUAIFENESIN-DM 100-10 MG/5ML PO SYRP: 15.0000 mL | ORAL_SOLUTION | ORAL | Status: DC | PRN

## 2012-02-12 MED ORDER — MORPHINE SULFATE 10 MG/ML IJ SOLN
2.0000 mg | INTRAMUSCULAR | Status: DC | PRN
  Administered 2012-02-12 (×2): 2 mg via INTRAVENOUS

## 2012-02-12 MED ORDER — ALUM & MAG HYDROXIDE-SIMETH 200-200-20 MG/5ML PO SUSP: 15.0000 mL | ORAL | Status: DC | PRN

## 2012-02-12 MED ORDER — LIDOCAINE HCL (PF) 1 % IJ SOLN
INTRAMUSCULAR | Status: AC
  Filled 2012-02-12: qty 30

## 2012-02-12 MED ORDER — ONDANSETRON HCL 4 MG/2ML IJ SOLN: 4.0000 mg | Freq: Four times a day (QID) | INTRAMUSCULAR | Status: DC | PRN

## 2012-02-12 MED ORDER — PANTOPRAZOLE SODIUM 40 MG PO TBEC: 40.0000 mg | DELAYED_RELEASE_TABLET | Freq: Every day | ORAL | Status: DC

## 2012-02-12 MED ORDER — HEPARIN (PORCINE) IN NACL 2-0.9 UNIT/ML-% IJ SOLN
INTRAMUSCULAR | Status: AC
  Filled 2012-02-12: qty 1000

## 2012-02-12 MED ORDER — SODIUM CHLORIDE 0.9 % IV SOLN
INTRAVENOUS | Status: DC
  Administered 2012-02-12: 06:00:00 via INTRAVENOUS

## 2012-02-12 NOTE — Op Note (Signed)
Procedure: Aortogram with right lower extremity runoff  Preoperative diagnosis: Claudication  Postoperative diagnosis: Same  Anesthesia Local  Operative details: After obtaining informed consent, the patient was taken to the [facility] LAB. The patient was placed in supine position on the Angio table. Both groins were prepped and draped in usual sterile fashion. Local anesthesia was infiltrated over the left common femoral artery. An introducer needle was used to cannulate the left common femoral artery and 035 versacore wire threaded into the abdominal aorta under fluoroscopic guidance. Next a 5 French sheath is placed over the guidewire in the left common femoral artery. A 5 French pigtail catheter was placed over the guidewire into the abdominal aorta and abdominal aortogram was obtained. The infrarenal abdominal aorta is patent. The left and right common internal and external iliac arteries are patent. There is an ulceration of the left proximal common iliac artery   In order to get increased opacification of the tibials a 5 Fr crossover catheter was brought up on the field.  The crossover catheter was used to selectively catheterize the right common iliac artery and the guidewire advanced into the right distal external iliac artery. The crossover catheter was removed and replaced with a 5 French straight catheter. Angiogram was then performed the right lower extremity.  In the right leg, the common femoral artery is patent.  The right SFA has a 70% mid stenosis.  The profunda is patent.  There is a bypass originating below the SFA stenosis which is patent.  Proximal anastomosis no narrowing.  Distal anastomosis to the PT is patent. The posterior tibial artery is the only runoff to the foot.  At this point an 035 Amplatz wire was placed through the catheter up and over the aortic bifurcation. The 5 French straight catheter was removed. The 5 French sheath was removed. A 7 French Terumo sheath was brought on  the operative field and advanced over the guidewire into the distal right external iliac artery. The patient was given 5000 units of intravenous heparin. Several contrast images were taken to determine the exact origin of the superficial femoral artery. This took several minutes to cannulate due to some plaque near the origin of the artery.  An 035 angled Glidewire was brought up in the operative field as well as a 5 [place] KMP catheter.  This was used to direct the wire and into the superficial femoral artery. The KMP catheter was advanced into the superficial femoral artery and exchange length guidewire was then placed through this deep into the superficial femoral artery across the stenosis. The KMP catheter was then removed. Contrast angiogram was used to determine the precise level of stenosis. A 6 x 4 mm angioplasty balloon was brought in operative field and the stenosis was angioplastied to 10 atmospheres for 30 seconds. Angioplasty balloon was then removed. A 7 x 3 self expanding stent was then brought up in the operative field and centered over the area of stenosis. This was then deployed. This was then post dilated with a 6 x 4 mm angioplasty balloon to profile. Completion arteriogram was performed which showed the stent was widely patent and fully expanded. There is no evidence of dissection. There was preserved runoff via the posterior tibial artery via a bypass graft.  Next the 7 French sheath was pulled back over the guidewire into the left pelvis. The guidewire was removed. The sheath was thoroughly flushed with heparinized saline. The 7 Fr sheath was left in place to be pulled in the  holding area. The patient tolerated the procedure well and there were no complications. Patient was taken to the holding area in stable condition.  Operative findings:  Right leg- SFA stenosis mid stented with 7 x 3 self expanding, patent SFA to PT bypass with one vessel runoff via PT Left common iliac penetrating  atherosclerotic ulcer The patient will be placed on Plavix today for at least 6 weeks and most likely indefinitely  [name], MD [facility] [place] Office: [phone] Pager: [phone]

## 2012-02-12 NOTE — Telephone Encounter (Signed)
Mailed letter to home address, dpm

## 2012-02-12 NOTE — Telephone Encounter (Signed)
Message copied by Fredrich Birks on Fri Feb 12, 2012  3:34 PM ------      Message from: Melene Plan      Created: Fri Feb 12, 2012 10:22 AM                   ----- Message -----         From: Sherren Kerns, MD         Sent: 02/12/2012   9:43 AM           To: Reuel Derby, Melene Plan, RN, #            Aortogram with right lower extremity runoff      Right SFA stent      Left groin puncture            Needs a follow up graft duplex and Rusty appt in 3 months            Leonette Most

## 2012-02-12 NOTE — H&P (View-Only) (Signed)
Patient is a 57 year old male who several weeks status post right above-knee to posterior tibial artery bypass with vein. He has had a previous left above-knee amputation. He reports no pain in his right foot. He has had a previous stroke so history is somewhat difficult to obtain. He is transferring on the right leg. He has had no drainage from the incisions. He did have a stenosis of the superficial femoral artery above his bypass graft but the origin of the bypass graft was placed below this due to limited conduit.  Physical exam: Filed Vitals:   01/14/12 0911  BP: 122/80  Pulse: 86  Height: 5\' 4"  (1.626 m)  Weight: 119 lb (53.978 kg)  SpO2: 100%   Right lower extremity: Right foot warm posterior tibial pulse not palpable all incisions well-healed  Data: Duplex of the right lower extremity today does show a superficial femoral artery stenosis with increased velocities of 275 cm/s.  Assessment: Patent bypass right leg but needs ABIs and arterial duplex today to assess hemodynamic significance of the superficial femoral artery which we will need to watch closely. We will remove all staples right leg today.  Plan: Right lower extremity arteriogram January 3 with most likely stenting of the right superficial femoral artery at that time. Risks benefits possible complications and procedure details were discussed the patient and his family member today.  [name], MD [facility] [place] Office: [phone] Pager: [phone]

## 2012-02-12 NOTE — Interval H&P Note (Signed)
History and Physical Interval Note:  02/12/2012 7:30 AM  Jeremy Howell  has presented today for surgery, with the diagnosis of PVD  The various methods of treatment have been discussed with the patient and family. After consideration of risks, benefits and other options for treatment, the patient has consented to  Procedure(s) (LRB) with comments: LOWER EXTREMITY ANGIOGRAM (N/A) as a surgical intervention .  The patient's history has been reviewed, patient examined, no change in status, stable for surgery.  I have reviewed the patient's chart and labs.  Questions were answered to the patient's satisfaction.     Rayneisha Bouza,Ashlee E

## 2012-02-12 NOTE — Telephone Encounter (Signed)
Message copied by Fredrich Birks on Fri Feb 12, 2012 10:08 AM ------      Message from: Sherren Kerns      Created: Fri Feb 12, 2012  9:43 AM       Aortogram with right lower extremity runoff      Right SFA stent      Left groin puncture            Needs a follow up graft duplex and Rusty appt in 3 months            Jeremy Howell

## 2012-02-15 LAB — POCT ACTIVATED CLOTTING TIME
Activated Clotting Time: 247 seconds
Activated Clotting Time: 203 seconds

## 2012-05-11 ENCOUNTER — Other Ambulatory Visit: Payer: Self-pay | Admitting: *Deleted

## 2012-05-11 ENCOUNTER — Encounter: Payer: Self-pay | Admitting: Vascular Surgery

## 2012-05-11 DIAGNOSIS — Z48812 Encounter for surgical aftercare following surgery on the circulatory system: Secondary | ICD-10-CM

## 2012-05-11 DIAGNOSIS — I739 Peripheral vascular disease, unspecified: Secondary | ICD-10-CM

## 2012-05-12 ENCOUNTER — Encounter (INDEPENDENT_AMBULATORY_CARE_PROVIDER_SITE_OTHER): Payer: Medicare Other | Admitting: *Deleted

## 2012-05-12 ENCOUNTER — Ambulatory Visit (INDEPENDENT_AMBULATORY_CARE_PROVIDER_SITE_OTHER): Payer: Medicare Other | Admitting: Vascular Surgery

## 2012-05-12 ENCOUNTER — Encounter: Payer: Self-pay | Admitting: Vascular Surgery

## 2012-05-12 VITALS — BP 122/72 | HR 59 | Resp 16 | Ht 67.0 in | Wt 120.4 lb

## 2012-05-12 DIAGNOSIS — I739 Peripheral vascular disease, unspecified: Secondary | ICD-10-CM

## 2012-05-12 DIAGNOSIS — Z48812 Encounter for surgical aftercare following surgery on the circulatory system: Secondary | ICD-10-CM

## 2012-05-12 NOTE — Progress Notes (Signed)
Patient is a 57 year old male who returns for followup today. He previously has undergone right superficial femoral artery stenting and right popliteal to posterior tibial artery bypass grafting. He is fairly debilitated and has had a previous left brain stroke and has only minimal function on his right side. He is also previously had a left leg amputation. He uses his right leg some for transfer. He denies any symptoms of claudication or nonhealing wound in the right leg.  Review of systems: He denies shortness of breath. He denies chest pain.  Physical exam:  Filed Vitals:   05/12/12 1546  BP: 122/72  Pulse: 59  Resp: 16  Height: 5\' 7"  (1.702 m)  Weight: 120 lb 6.4 oz (54.613 kg)  SpO2: 98%   Right lower extremity: No palpable pedal pulses but foot is warm and well-perfused 2+ right femoral pulse.  Data: Patient had a duplex ultrasound today which suggested proximal superficial femoral artery stenosis at the origin of the SFA above the level of his stent and bypass. Velocities were 413 cm/s. ABI was 0.77 compared to previous ABI of 0.8.  Assessment: Asymptomatic proximal right superficial femoral artery stenosis with some potential decrease in ABI.  Plan: Repeat duplex in 3 months time. If velocities are continuing to increase or developing symptoms will consider repeat arteriogram  [name], MD [facility] [place] Office: [phone] Pager: [phone]

## 2012-05-18 ENCOUNTER — Other Ambulatory Visit: Payer: Self-pay | Admitting: *Deleted

## 2012-05-18 DIAGNOSIS — I739 Peripheral vascular disease, unspecified: Secondary | ICD-10-CM

## 2012-05-18 DIAGNOSIS — Z48812 Encounter for surgical aftercare following surgery on the circulatory system: Secondary | ICD-10-CM

## 2012-06-13 ENCOUNTER — Encounter: Payer: Self-pay | Admitting: Nurse Practitioner

## 2012-06-13 ENCOUNTER — Non-Acute Institutional Stay (SKILLED_NURSING_FACILITY): Payer: Medicare Other | Admitting: Nurse Practitioner

## 2012-06-13 DIAGNOSIS — D649 Anemia, unspecified: Secondary | ICD-10-CM

## 2012-06-13 DIAGNOSIS — D529 Folate deficiency anemia, unspecified: Secondary | ICD-10-CM | POA: Insufficient documentation

## 2012-06-13 DIAGNOSIS — I10 Essential (primary) hypertension: Secondary | ICD-10-CM

## 2012-06-13 DIAGNOSIS — E119 Type 2 diabetes mellitus without complications: Secondary | ICD-10-CM

## 2012-06-13 DIAGNOSIS — Z8673 Personal history of transient ischemic attack (TIA), and cerebral infarction without residual deficits: Secondary | ICD-10-CM

## 2012-06-13 NOTE — Assessment & Plan Note (Signed)
Patient is stable; continue current regimen. Will monitor and make changes as necessary.  

## 2012-06-13 NOTE — Assessment & Plan Note (Signed)
No episode of hypoglycemia- blood sugars stable- will get A1c and CMP

## 2012-06-13 NOTE — Assessment & Plan Note (Signed)
Will get cbc, ferritin, b12 and folate Will start ferrous sulfate 325mg  BID with meals

## 2012-06-13 NOTE — Progress Notes (Signed)
Patient ID: Jeremy Howell, male   DOB: May 24, 1955, 57 y.o.   MRN: [id number]  Chief Complaint: medical management of chronic conditions   HPI: 57 year old being seen today for routine follow up doing well without current complaints and nursing does not have any acute issues  REASSESSMENT OF ONGOING PROBLEMS:   250.00-DM, UNCOMPLICATED TYPE II The diabetes remains stable. No noted polyuria, polyphagia, polydipsia, change in vision, foot ulcerations, or hypoglycemic episodes.No complications noted from the medication presently being used. lantus 5 units at night 285.9-ANEMIA  folic acid dialy  401.9-HTN UNSPECIFIED The blood pressure readings taken outside the office since the last visit have been in the target range. The patient denies chest pain, shortness of breath, dyspnea on exertion, pedal edema, or headache. No complications noted from the medication presently being used. 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 The patient denies loss of vision, transient ischemic symptoms, sudden weakness or numbness, difficulty swallowing, or confusion.The patient is tolerating aspirin well.  311-DEPRESSIVE DISORDER NEC The depression remains stable.  on celexa 20mg  daily  564.00-CONSTIPATION    stable- taking colace 100mg  BID   Review of Systems:  Difficult to obtain- pt has expressive aphagia and is a poor historian- see HPI Medications: Patient's Medications  New Prescriptions   No medications on file  Previous Medications   AMLODIPINE (NORVASC) 10 MG TABLET    Take 10 mg by mouth daily.   ASPIRIN 325 MG TABLET    Take 325 mg by mouth daily.   CARVEDILOL (COREG) 25 MG TABLET    Take 25 mg by mouth daily.    CITALOPRAM (CELEXA) 20 MG TABLET       DOCUSATE SODIUM (COLACE) 100 MG CAPSULE    Take 100 mg by mouth 2 (two) times daily.   FOLIC ACID (FOLVITE) 1 MG TABLET    Take 1 mg by mouth daily.   LANTUS SOLOSTAR 100  UNIT/ML INJECTION    Inject 5 Units into the skin daily.    LISINOPRIL (PRINIVIL,ZESTRIL) 10 MG TABLET    Take 5 mg by mouth daily.   OXYCODONE (OXY-IR) 5 MG CAPSULE    Take 5-10 mg by mouth every 6 (six) hours as needed. 1 tablet (5 mg) as needed for mild pain, 2 tablets (10 mg) every 6 hours as needed for severe pain  Modified Medications   No medications on file  Discontinued Medications   CLOPIDOGREL (PLAVIX) 75 MG TABLET    Take 1 tablet (75 mg total) by mouth daily.   INSULIN ASPART (NOVOLOG) 100 UNIT/ML INJECTION    Inject 3 Units into the skin 2 (two) times daily. Prior to lunch and dinner   NICOTINE (NICODERM CQ - DOSED IN MG/24 HOURS) 21 MG/24HR PATCH    Place 1 patch onto the skin daily.     Physical Exam: Physical Exam  Constitutional: He is well-developed, well-nourished, and in no distress. No distress.  Cardiovascular: Normal rate, regular rhythm and normal heart sounds.   Pulmonary/Chest: Effort normal and breath sounds normal.  Abdominal: Soft. Bowel sounds are normal.  Musculoskeletal: He exhibits no edema and no tenderness.  Left AKA  Neurological: He is alert.  Skin: Skin is warm. He is not diaphoretic. No erythema.     Filed Vitals:   06/13/12 1746  BP: 140/77  Pulse: 56  Temp: 98.2 F (36.8 C)  Resp: 20  Height: 5\' 7"  (1.702 m)  Weight:  126 lb 9.6 oz (57.425 kg)      Labs reviewed: Basic Metabolic Panel:  Recent Labs  40/98/11 0425  10/29/11 0500 12/22/11 1144 12/24/11 0440 02/12/12 0554  NA 139  < > 138 137 132* 144  K 4.8  < > 3.5 4.6 4.0 4.2  CL 106  < > 102 100 98 105  CO2 24  < > 29 24 23   --   GLUCOSE 108*  < > 103* 88 79 88  BUN 24*  < > 9 18 13 16   CREATININE 1.07  < > 0.74 0.84 0.90 0.90  CALCIUM 9.4  < > 9.0 10.2 9.4  --   MG 1.7  --   --   --   --   --   PHOS 3.8  --   --   --   --   --   < > = values in this interval not displayed.  Liver Function Tests:  Recent Labs  10/22/11 2208 10/24/11 0600 12/22/11 1144  AST 15  18 23   ALT 20 14 33  ALKPHOS 79 57 93  BILITOT 0.1* 0.5 0.2*  PROT 7.4 6.4 8.3  ALBUMIN 3.2* 2.5* 3.5    CBC:  Recent Labs  08/14/11 1335  10/22/11 2208  10/24/11 0600  10/27/11 0540 12/22/11 1144 12/24/11 0440 02/12/12 0554  WBC 13.5*  < > 2.7*  < > 7.3  < > 6.7 4.3 6.0  --   NEUTROABS 10.3*  --  1.7  --  5.9  --   --   --   --   --   HGB 16.8  < > 13.1  < > 11.8*  < > 11.6* 13.2 11.6* 15.0  HCT 48.6  < > 39.9  < > 36.0*  < > 35.5* 41.1 35.7* 44.0  MCV 79.2  < > 81.8  < > 81.8  < > 78.9 79.8 79.3  --   PLT 179  < > 303  < > 222  < > 276 389 334  --   < > = values in this interval not displayed.     Assessment/Plan Diabetes mellitus No episode of hypoglycemia- blood sugars stable- will get A1c and CMP  Anemia Will get cbc, ferritin, b12 and folate Will start ferrous sulfate 325mg  BID with meals   HTN (hypertension) Patient is stable; continue current regimen. Will monitor and make changes as necessary.   History of CVA (cerebrovascular accident) Patient is stable; continue current regimen. Will monitor and make changes as necessary.

## 2012-08-08 ENCOUNTER — Non-Acute Institutional Stay (SKILLED_NURSING_FACILITY): Payer: Medicare Other | Admitting: Nurse Practitioner

## 2012-08-08 DIAGNOSIS — I10 Essential (primary) hypertension: Secondary | ICD-10-CM

## 2012-08-08 DIAGNOSIS — E119 Type 2 diabetes mellitus without complications: Secondary | ICD-10-CM

## 2012-08-08 DIAGNOSIS — I509 Heart failure, unspecified: Secondary | ICD-10-CM

## 2012-08-08 DIAGNOSIS — D649 Anemia, unspecified: Secondary | ICD-10-CM

## 2012-08-08 DIAGNOSIS — I5042 Chronic combined systolic (congestive) and diastolic (congestive) heart failure: Secondary | ICD-10-CM

## 2012-08-08 NOTE — Progress Notes (Signed)
Patient ID: Jeremy Howell, male   DOB: 14-Oct-1955, 57 y.o.   MRN: [id number]  Nursing Home Location:  [facility]   Place of Service: SNF (31)   Chief Complaint: medical management of chronic conditions   HPI:  57 year old who is a long term resident of [facility] is being seen today for routine follow up doing well without current complaints and nursing does not have any acute issues.  REASSESSMENT OF ONGOING PROBLEMS:  250.00-DM, UNCOMPLICATED TYPE II The diabetes remains stable. No noted polyuria, polyphagia, polydipsia, change in vision, foot ulcerations, or hypoglycemic episodes.No complications noted from the medication presently being used. lantus 5 units at night  285.9-ANEMIA taking iron TID with meals  401.9-HTN UNSPECIFIED The blood pressure readings taken outside the office since the last visit have been in the target range. The patient denies chest pain, shortness of breath, dyspnea on exertion, pedal edema, or headache. No complications noted from the medication presently being used. 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 The patient denies loss of vision, transient ischemic symptoms, sudden weakness or numbness, difficulty swallowing, or confusion.The patient is tolerating aspirin well.  311-DEPRESSIVE DISORDER NEC The depression remains stable. on celexa 20mg  daily  564.00-CONSTIPATION stable- taking colace 100mg  BID    Review of Systems:  Difficult to obtain- pt has expressive aphagia and is a poor historian- see HPI   Medications: Patient's Medications  New Prescriptions   No medications on file  Previous Medications   ASPIRIN 325 MG TABLET    Take 325 mg by mouth daily.   CARVEDILOL (COREG) 25 MG TABLET    Take 25 mg by mouth daily.    CITALOPRAM (CELEXA) 20 MG TABLET       DOCUSATE SODIUM (COLACE) 100 MG CAPSULE    Take 100 mg by mouth 2 (two) times daily.   FOLIC ACID  (FOLVITE) 1 MG TABLET    Take 1 mg by mouth daily.   FUROSEMIDE (LASIX) 20 MG TABLET    Take 20 mg by mouth daily.   LANTUS SOLOSTAR 100 UNIT/ML INJECTION    Inject 5 Units into the skin daily.    LISINOPRIL (PRINIVIL,ZESTRIL) 10 MG TABLET    Take 10 mg by mouth daily.    OXYCODONE (OXY-IR) 5 MG CAPSULE    Take 5-10 mg by mouth every 6 (six) hours as needed. 1 tablet (5 mg) as needed for mild pain, 2 tablets (10 mg) every 6 hours as needed for severe pain  Modified Medications   No medications on file  Discontinued Medications   AMLODIPINE (NORVASC) 10 MG TABLET    Take 10 mg by mouth daily.     Physical Exam:  Filed Vitals:   08/08/12 1353  BP: 133/76  Pulse: 72  Temp: 97.6 F (36.4 C)  Resp: 20    Physical Exam  Constitutional: He is well-developed, well-nourished, and in no distress. No distress.  HENT:  Head: Normocephalic and atraumatic.  Mouth/Throat: Oropharynx is clear and moist. No oropharyngeal exudate.  Poor dentition   Eyes: Conjunctivae and EOM are normal. Pupils are equal, round, and reactive to light.  Neck: Normal range of motion. Neck supple.  Cardiovascular: Normal rate, regular rhythm and normal heart sounds.   Pulmonary/Chest: Effort normal and breath sounds normal.  Abdominal: Soft. Bowel sounds are normal. He exhibits no distension.  Musculoskeletal: Normal range of motion. He exhibits no edema and  no tenderness.  Neurological: He is alert.  Skin: Skin is warm and dry. He is not diaphoretic.     Labs reviewed: CMP with Estimated GFR       Result: 06/14/2012 1:54 PM    ( Status: F )       C     Sodium  138        135-145  mEq/L  SLN       Potassium  4.6        3.5-5.3  mEq/L  SLN       Chloride  105        96-112  mEq/L  SLN       CO2  24        19-32  mEq/L  SLN       Glucose  89        70-99  mg/dL  SLN       BUN  24     H  6-23  mg/dL  SLN       Creatinine  1.05        0.50-1.35  mg/dL  SLN       Bilirubin, Total  0.3        0.3-1.2  mg/dL  SLN        Alkaline Phosphatase  65        39-117  U/L  SLN       AST/SGOT  14        0-37  U/L  SLN       ALT/SGPT  18        0-53  U/L  SLN       Total Protein  6.9        6.0-8.3  g/dL  SLN       Albumin  3.5        3.5-5.2  g/dL  SLN       Calcium  8.9        8.4-10.5  mg/dL  SLN       Est GFR, African American  >89         mL/min  SLN       Est GFR, NonAfrican American  78         mL/min  SLN  C    Vitamin B12       Result: 06/14/2012 12:27 PM    ( Status: F )            Vitamin B12  1064     H  211-911  pg/mL  SLN      Folate       Result: 06/14/2012 12:27 PM    ( Status: F )            Folate  >20.0         ng/mL  SLN  C    Ferritin       Result: 06/14/2012 12:27 PM    ( Status: F )            Ferritin  17     L  22-322  ng/mL  SLN      Hemoglobin A1C       Result: 06/14/2012 2:40 PM    ( Status: F )            Hemoglobin A1C  5.6        <5.7  %  SLN  C     Estimated Average Glucose  114        <117     Assessment/Plan  1.   HTN (hypertension) 401.9     Patient is stable; continue current regimen. Will monitor and make changes as needed   2.   Diabetes mellitus 250.00     A1c at 5.6 will cont current regimen   3.   Anemia 285.9     Will follow up CBC    4.   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 Will cont to monitor weights per facility protocol

## 2012-08-18 ENCOUNTER — Ambulatory Visit: Payer: Medicare Other | Admitting: Vascular Surgery

## 2012-08-18 ENCOUNTER — Encounter (INDEPENDENT_AMBULATORY_CARE_PROVIDER_SITE_OTHER): Payer: Medicare Other | Admitting: *Deleted

## 2012-08-18 DIAGNOSIS — Z48812 Encounter for surgical aftercare following surgery on the circulatory system: Secondary | ICD-10-CM

## 2012-08-18 DIAGNOSIS — I739 Peripheral vascular disease, unspecified: Secondary | ICD-10-CM

## 2012-08-19 ENCOUNTER — Other Ambulatory Visit: Payer: Self-pay | Admitting: *Deleted

## 2012-08-19 DIAGNOSIS — I739 Peripheral vascular disease, unspecified: Secondary | ICD-10-CM

## 2012-08-19 DIAGNOSIS — Z48812 Encounter for surgical aftercare following surgery on the circulatory system: Secondary | ICD-10-CM

## 2012-09-06 ENCOUNTER — Encounter: Payer: Self-pay | Admitting: Nurse Practitioner

## 2012-09-06 ENCOUNTER — Non-Acute Institutional Stay (SKILLED_NURSING_FACILITY): Payer: Medicare Other | Admitting: Nurse Practitioner

## 2012-09-06 ENCOUNTER — Encounter: Payer: Self-pay | Admitting: Vascular Surgery

## 2012-09-06 DIAGNOSIS — Z8673 Personal history of transient ischemic attack (TIA), and cerebral infarction without residual deficits: Secondary | ICD-10-CM

## 2012-09-06 DIAGNOSIS — F329 Major depressive disorder, single episode, unspecified: Secondary | ICD-10-CM

## 2012-09-06 DIAGNOSIS — D649 Anemia, unspecified: Secondary | ICD-10-CM

## 2012-09-06 DIAGNOSIS — I10 Essential (primary) hypertension: Secondary | ICD-10-CM

## 2012-09-06 DIAGNOSIS — F3289 Other specified depressive episodes: Secondary | ICD-10-CM

## 2012-09-06 DIAGNOSIS — F32A Depression, unspecified: Secondary | ICD-10-CM

## 2012-09-06 NOTE — Progress Notes (Signed)
Patient ID: Jeremy Howell, male   DOB: 1955/11/06, 57 y.o.   MRN: [id number]  Nursing Home Location:  [facility]   Place of Service: SNF (31)  Chief Complaint  Patient presents with  . Medical Managment of Chronic Issues    HPI:  57 year old who is a long term resident of [facility] is being seen today for routine follow up doing well without current complaints and nursing does not have any acute issues.  REASSESSMENT OF ONGOING PROBLEMS:  DM, UNCOMPLICATED TYPE II The diabetes remains stable. No noted polyuria, polyphagia, polydipsia, change in vision, foot ulcerations, or hypoglycemic episodes.No complications noted from the medication presently being used. lantus 5 units at night -- A1c in may 2014 was 5.6 ANEMIA taking iron TID with meals  HTN UNSPECIFIED The blood pressure readings taken outside the office since the last visit have been in the target range. The patient denies chest pain, shortness of breath, dyspnea on exertion, pedal edema, or headache. No complications noted from the medication presently being used. , coreg 25mg  daily and prinivil 10 mg daily  CVA, LATE EFFECT The neurological status is stable. The patient denies loss of vision, transient ischemic symptoms, sudden weakness or numbness, difficulty swallowing, or confusion.The patient is tolerating aspirin well.  DEPRESSIVE DISORDER NEC The depression remains stable. on celexa 20mg  daily  CONSTIPATION stable- taking colace 100mg  BID      Review of Systems:  Difficult to obtain- pt has expressive aphagia and is a poor historian- see HPI   Medications: Patient's Medications  New Prescriptions   No medications on file  Previous Medications   ASPIRIN 325 MG TABLET    Take 325 mg by mouth daily.   CARVEDILOL (COREG) 25 MG TABLET    Take 25 mg by mouth daily.    CITALOPRAM (CELEXA) 20 MG TABLET       DOCUSATE SODIUM (COLACE) 100 MG CAPSULE    Take 100 mg by mouth 2 (two) times daily.    FOLIC ACID (FOLVITE) 1 MG TABLET    Take 1 mg by mouth daily.   FUROSEMIDE (LASIX) 20 MG TABLET    Take 20 mg by mouth daily.   LANTUS SOLOSTAR 100 UNIT/ML INJECTION    Inject 5 Units into the skin daily.    LISINOPRIL (PRINIVIL,ZESTRIL) 10 MG TABLET    Take 10 mg by mouth daily.    OXYCODONE (OXY-IR) 5 MG CAPSULE    Take 5-10 mg by mouth every 6 (six) hours as needed. 1 tablet (5 mg) as needed for mild pain, 2 tablets (10 mg) every 6 hours as needed for severe pain  Modified Medications   No medications on file  Discontinued Medications   No medications on file     Physical Exam:  Filed Vitals:   09/06/12 1512  BP: 131/79  Pulse: 60  Temp: 98 F (36.7 C)  Resp: 20    GENERAL APPEARANCE: Alert, Appropriately groomed. No acute distress.  SKIN: No diaphoresis rash, or wounds HEAD: Normocephalic, atraumatic  EYES: Conjunctiva/lids clear. Pupils round, reactive. EOMs intact.  EARS: External exam WNL, canals clear. Hearing grossly normal.  NOSE: No deformity or discharge.  MOUTH/THROAT: Lips w/o lesions. Poor dentition. Tongue moist, w/o lesion.  NECK: No thyroid tenderness, enlargement or nodule  RESPIRATORY: Breathing is even, unlabored. Lung sounds are clear   CARDIOVASCULAR: Heart RRR no murmurs, rubs or gallops. No peripheral edema.  ARTERIAL: radial pulse 2+ GASTROINTESTINAL: Abdomen is soft, non-tender,  not distended w/ normal bowel sounds. GENITOURINARY: Bladder non tender, not distended  MUSCULOSKELETAL:Left AKA NEUROLOGIC: Oriented to self  PSYCHIATRIC: Mood and affect appropriate to situation, no behavioral issues  Labs reviewed/Significant Diagnostic Results: CBC NO Diff (Complete Blood Count)       Result: 08/09/2012 11:24 PM    ( Status: F )            WBC  4.7        4.0-10.5  K/uL  SLN       RBC  5.20        4.22-5.81  MIL/uL  SLN       Hemoglobin  12.8     L  13.0-17.0  g/dL  SLN       Hematocrit  37.0     L  39.0-52.0  %  SLN       MCV  71.2     L   78.0-100.0  fL  SLN       MCH  24.6     L  26.0-34.0  pg  SLN       MCHC  34.6        30.0-36.0  g/dL  SLN       RDW  29.5     H  11.5-15.5  %  SLN       Platelet Count  447     H  150-400  K/uL  SLN      Basic Metabolic Panel       Result: 08/10/2012 6:38 AM    ( Status: F )            Sodium  136        135-145  mEq/L  SLN       Potassium  4.8        3.5-5.3  mEq/L  SLN       Chloride  105        96-112  mEq/L  SLN       CO2  23        19-32  mEq/L  SLN       Glucose  102     H  70-99  mg/dL  SLN       BUN  37     H  6-23  mg/dL  SLN       Creatinine  1.12        0.50-1.35  mg/dL  SLN       Calcium  9.0      Assessment/Plan 1. Anemia Stable; cont on iron  2. History of CVA (cerebrovascular accident) With aphagia and right sided weakness; stable cont on ASA  3. HTN (hypertension) Patient is stable; continue current regimen. Will monitor and make changes as necessary.  4. Depression Stable- conts on celexa

## 2012-09-07 ENCOUNTER — Ambulatory Visit: Payer: Medicare Other | Admitting: Vascular Surgery

## 2012-09-08 ENCOUNTER — Ambulatory Visit: Payer: Medicare Other | Admitting: Vascular Surgery

## 2012-09-26 ENCOUNTER — Other Ambulatory Visit: Payer: Self-pay | Admitting: Geriatric Medicine

## 2012-09-26 MED ORDER — TRAMADOL HCL 50 MG PO TABS: ORAL_TABLET | ORAL | Status: DC

## 2012-09-29 ENCOUNTER — Non-Acute Institutional Stay (SKILLED_NURSING_FACILITY): Payer: Medicare Other | Admitting: Nurse Practitioner

## 2012-09-29 DIAGNOSIS — D649 Anemia, unspecified: Secondary | ICD-10-CM

## 2012-09-29 DIAGNOSIS — E119 Type 2 diabetes mellitus without complications: Secondary | ICD-10-CM

## 2012-09-29 DIAGNOSIS — H5789 Other specified disorders of eye and adnexa: Secondary | ICD-10-CM

## 2012-09-29 DIAGNOSIS — I10 Essential (primary) hypertension: Secondary | ICD-10-CM

## 2012-09-29 NOTE — Progress Notes (Signed)
Patient ID: Jeremy Howell, male   DOB: 1956-01-15, 57 y.o.   MRN: [id number]  Nursing Home Location:  [facility]   Place of Service: SNF (31)  Chief Complaint  Patient presents with  . Medical Managment of Chronic Issues  . red eye    HPI:  57 year old who is a long term resident of [facility] is being seen today for routine follow up doing well however have noted increase redness and itching to left eye. Denies pain and there has been no drainage and nursing does not have any acute issues.  REASSESSMENT OF ONGOING PROBLEMS:  DM, UNCOMPLICATED TYPE II The diabetes remains stable. No noted polyuria, polyphagia, polydipsia, change in vision, foot ulcerations, or hypoglycemic episodes.No complications noted from the medication presently being used. lantus 5 units at night -- A1c in may 2014 was 5.6  ANEMIA taking iron TID with meals  HTN UNSPECIFIED The blood pressure readings taken outside the office since the last visit have been in the target range. The patient denies chest pain, shortness of breath, dyspnea on exertion, pedal edema, or headache. No complications noted from the medication presently being used. , coreg 25mg  daily and prinivil 10 mg daily  CVA, LATE EFFECT The neurological status is stable. The patient denies loss of vision, transient ischemic symptoms, sudden weakness or numbness, difficulty swallowing, or confusion.The patient is tolerating aspirin well.  DEPRESSIVE DISORDER NEC The depression remains stable. on celexa 20mg  daily  CONSTIPATION stable- taking colace 100mg  BID      Review of Systems:  Difficult to obtain- pt has expressive aphagia and is a poor historian- see HPI  Medications: Patient's Medications  New Prescriptions   No medications on file  Previous Medications   ASPIRIN 325 MG TABLET    Take 325 mg by mouth daily.   CARVEDILOL (COREG) 25 MG TABLET    Take 25 mg by mouth daily.    CITALOPRAM (CELEXA) 20 MG TABLET        DOCUSATE SODIUM (COLACE) 100 MG CAPSULE    Take 100 mg by mouth 2 (two) times daily.   FERROUS SULFATE 325 (65 FE) MG TABLET    Take 325 mg by mouth 3 (three) times daily.   FOLIC ACID (FOLVITE) 1 MG TABLET    Take 1 mg by mouth daily.   FUROSEMIDE (LASIX) 20 MG TABLET    Take 20 mg by mouth daily.   LANTUS SOLOSTAR 100 UNIT/ML INJECTION    Inject 5 Units into the skin daily.    LISINOPRIL (PRINIVIL,ZESTRIL) 10 MG TABLET    Take 10 mg by mouth daily.    OXYCODONE (OXY-IR) 5 MG CAPSULE    Take 5-10 mg by mouth every 6 (six) hours as needed. 1 tablet (5 mg) as needed for mild pain, 2 tablets (10 mg) every 6 hours as needed for severe pain   TRAMADOL (ULTRAM) 50 MG TABLET    Take one tablet by mouth twice daily as needed.  Modified Medications   No medications on file  Discontinued Medications   No medications on file     Physical Exam:  Filed Vitals:   09/29/12 1633  BP: 135/81  Pulse: 60  Temp: 97.2 F (36.2 C)  Resp: 18  Weight: 132 lb (59.875 kg)     GENERAL APPEARANCE: Alert, conversant. Appropriately groomed. No acute distress.  SKIN: No diaphoresis rash, or wounds HEAD: Normocephalic, atraumatic  EYES: Conjunctiva/lids red bilaterally. Pupils round, reactive. EOMs intact.  EARS: External exam WNL, canals clear. Hearing grossly normal.  NOSE: No deformity or discharge.  MOUTH/THROAT: Lips w/o lesions. Mouth and throat normal. Tongue moist, w/o lesion.  NECK: No thyroid tenderness, enlargement or nodule  RESPIRATORY: Breathing is even, unlabored. Lung sounds are clear   CARDIOVASCULAR: Heart RRR no murmurs, rubs or gallops. No peripheral edema.  ARTERIAL: radial pulse 2+ GASTROINTESTINAL: Abdomen is soft, non-tender, not distended w/ normal bowel sounds GENITOURINARY: Bladder non tender, not distended  MUSCULOSKELETAL: left AKA NEUROLOGIC: Oriented to self, right sided weakness due to CVA PSYCHIATRIC: Mood and affect appropriate to situation, no behavioral  issues  Labs reviewed/Significant Diagnostic Results:  CBC NO Diff (Complete Blood Count)       Result: 08/09/2012 11:24 PM    ( Status: F )            WBC  4.7        4.0-10.5  K/uL  SLN       RBC  5.20        4.22-5.81  MIL/uL  SLN       Hemoglobin  12.8     L  13.0-17.0  g/dL  SLN       Hematocrit  37.0     L  39.0-52.0  %  SLN       MCV  71.2     L  78.0-100.0  fL  SLN       MCH  24.6     L  26.0-34.0  pg  SLN       MCHC  34.6        30.0-36.0  g/dL  SLN       RDW  78.2     H  11.5-15.5  %  SLN       Platelet Count  447     H  150-400  K/uL  SLN      Basic Metabolic Panel       Result: 08/10/2012 6:38 AM    ( Status: F )            Sodium  136        135-145  mEq/L  SLN       Potassium  4.8        3.5-5.3  mEq/L  SLN       Chloride  105        96-112  mEq/L  SLN       CO2  23        19-32  mEq/L  SLN       Glucose  102     H  70-99  mg/dL  SLN       BUN  37     H  6-23  mg/dL  SLN       Creatinine  1.12        0.50-1.35  mg/dL  SLN       Calcium  9.0        8.4-10.5  mg/dL  SLN          Assessment/Plan 1. Anemia Will follow up cbc   2. Diabetes mellitus Diabetes is stable. CBGs and A1c at goal  3. HTN (hypertension) Patient is stable; continue current regimen. Will monitor and make changes as necessary.  4. Red eye Appears to be allergic conjunctivitis will start pataday daily for 7 days nursing to notify if eye gets worse or failure to improve

## 2012-10-05 ENCOUNTER — Encounter: Payer: Self-pay | Admitting: Vascular Surgery

## 2012-10-06 ENCOUNTER — Encounter: Payer: Self-pay | Admitting: Vascular Surgery

## 2012-10-06 ENCOUNTER — Ambulatory Visit (INDEPENDENT_AMBULATORY_CARE_PROVIDER_SITE_OTHER): Payer: Medicare Other | Admitting: Vascular Surgery

## 2012-10-06 ENCOUNTER — Other Ambulatory Visit: Payer: Self-pay

## 2012-10-06 VITALS — BP 124/73 | HR 60 | Ht 67.0 in | Wt 132.0 lb

## 2012-10-06 DIAGNOSIS — I739 Peripheral vascular disease, unspecified: Secondary | ICD-10-CM

## 2012-10-07 NOTE — Progress Notes (Signed)
Patient is a 57-year-old male who has previous had undergone above-knee pop to posterior tibial artery bypass in November 2013. He subsequently underwent right superficial femoral artery stenting in January of 2014. He has had previous left above-knee amputation. He currently uses his right leg only for transfer. He currently resides in a skilled nursing facility. He has some occasional pain in his foot but this does not sound leg rest pain. He has no ulcerations. He is here today for followup visit. Chronic medical problems include cardiomyopathy with ejection fraction of 20-25%, prior stroke, diabetes, hypertension. These are all currently stable.  Past Medical History  Diagnosis Date  . Cardiomyopathy     EF 20-25% by echo 01/2010 with severe LVH felt secondary to substance abuse (no ischemic workup)  . 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 Diabetes mellitus   . HTN (hypertension)   . Polysubstance abuse     Reported hx of cocaine, THC, heroin, EtOH abuse  . Noncompliance   . Peripheral vascular disease   . CHF (congestive heart failure)   . Pneumonia   . Dependent for walking   . Wheelchair bound   . Acute renal failure     Past Surgical History  Procedure Laterality Date  . Total hip arthroplasty    . Amputation  08/15/2011    Procedure: AMPUTATION ABOVE KNEE;  Surgeon: Tomislav E Quamaine Webb, MD;  Location: [facility] OR;  Service: Vascular;  Laterality: Left;  Amputation End:  0855  . Femoral-tibial bypass graft  12/23/2011    Procedure: BYPASS GRAFT FEMORAL-TIBIAL ARTERY;  Surgeon: Jerime E Elaysia Devargas, MD;  Location: [facility] OR;  Service: Vascular;  Laterality: Right;  Right Popliteal - Tibial Artery Bypass using Reversed Saphenous vein   Family History  Problem Relation Age of Onset  . Stroke Brother    Current Outpatient Prescriptions on File Prior to Visit  Medication Sig Dispense Refill  . aspirin 325 MG tablet Take 325 mg by  mouth daily.      . carvedilol (COREG) 25 MG tablet Take 25 mg by mouth daily.       . citalopram (CELEXA) 20 MG tablet Take 20 mg by mouth daily.       . ferrous sulfate 325 (65 FE) MG tablet Take 325 mg by mouth 3 (three) times daily.      . folic acid (FOLVITE) 1 MG tablet Take 1 mg by mouth daily.      . furosemide (LASIX) 20 MG tablet Take 20 mg by mouth daily.      . LANTUS SOLOSTAR 100 UNIT/ML injection Inject 5 Units into the skin daily.       . lisinopril (PRINIVIL,ZESTRIL) 10 MG tablet Take 10 mg by mouth daily.       . traMADol (ULTRAM) 50 MG tablet Take one tablet by mouth twice daily as needed.  60 tablet  5  . docusate sodium (COLACE) 100 MG capsule Take 100 mg by mouth 2 (two) times daily.      . oxycodone (OXY-IR) 5 MG capsule Take 5-10 mg by mouth every 6 (six) hours as needed. 1 tablet (5 mg) as needed for mild pain, 2 tablets (10 mg) every 6 hours as needed for severe pain       No current facility-administered medications on file prior to visit.    Allergies  Allergen Reactions  . Almond Oil Shortness Of Breath and Swelling       Review of systems: He denies chest pain. He denies shortness of breath.  Physical exam:  Filed Vitals:   10/06/12 1044  BP: 124/73  Pulse: 60  Height: 5' 7" (1.702 m)  Weight: 132 lb (59.875 kg)  SpO2: 99%   Right lower extremity: No palpable pedal pulses no ulcerations Chest: Clear to auscultation  Cardiac: Regular rate and rhythm without murmur  Abdomen: Thin soft nontender  Neck: No carotid bruits  Data: Patient had a duplex ultrasound and ABIs of his right lower extremity today. ABI was 0.71 there were increased velocities in the proximal right superficial femoral artery the 429 cm/s area stent and the mid SFA was patent. The bypass was patent.  Assessment: Progressive stenosis right proximal superficial femoral artery currently asymptomatic but compromising inflow to his bypass graft  Plan: Lower extremity arteriogram  possible intervention on 10/14/2012 risk benefits possible complications procedure details were explained the patient today including but not limited to bleeding infection vessel injury contrast reaction. He understands and agrees to proceed  Demorris Zaki Gertsch, MD [facility] Office: [phone] Pager: [phone]  VASCULAR QUALITY INITIATIVE FOLLOW UP DATA:  Current smoker: [  ] yes  [x  ] no  Living status: [  ]  Home  [x  ] Nursing home  [  ] Homeless    MEDS:  ASA [x  ] yes  [  ] no- [  ] medical reason  [  ] non compliant  STATIN  [ x ] yes  [  ] no- [  ] medical reason  [  ] non compliant  Beta blocker [ x ] yes  [  ] no- [  ] medical reason  [  ] non compliant  ACE inhibitor [x  ] yes  [  ] no- [  ] medical reason  [  ] non compliant  P2Y12 Antagonist [x  ] none  [  ] clopidogrel-Plavix  [  ] ticlopidine-Ticlid   [  ] prasugrel-Effient  [  ] ticagrelor- Brilinta    Anticoagulant [ x ] None  [  ] warfarin  [  ] rivaroxaban-Xarelto [  ] dabigatran- Pradaxa  Ambulation: [  ] Amb  [  ] Amb with assistance  [ x ] wheelchair  [  ] bedridden  Ipsilateral Sx: [ x ] none   [  ] claudication  [  ] rest pain  [  ] tissue loss  Current Patency: [ x ] primary  [  ] primary-assisted  [  ] secondary  [  ] occluded  Patency judged by: [x  ] doppler  [  ] palp graft pulse  [  ] palp distal pulse   [  x] ABI increase > 15%   [  x] Duplex  If occluded, when-   Ipsilateral ABI: 0.71  Ipsilateral TBI:   Infection: [ x ] none  [  ] cellulitis  [  ] deep abscess  [  ] infection of artery or graft  Bypass revision: [ x ] no  [  ] yes- [  ] surg  [  ] catheter based  [  ] both    Date:   Thrombectomy/ lysis/ revision:  [x  ] no  [  ] yes- [  ] surg  [  ] catheter based  [  ] both    Date:   Major amputation: [ x ] no  [  ] minor amp  [  ]   BKA  [  ] AKA   Date:     

## 2012-10-11 ENCOUNTER — Encounter (HOSPITAL_COMMUNITY): Payer: Self-pay | Admitting: Pharmacy Technician

## 2012-10-14 ENCOUNTER — Other Ambulatory Visit: Payer: Self-pay | Admitting: *Deleted

## 2012-10-14 ENCOUNTER — Ambulatory Visit (HOSPITAL_COMMUNITY)
Admission: RE | Admit: 2012-10-14 | Discharge: 2012-10-14 | Disposition: A | Discharge status: Home / Self Care | Payer: Medicare Other | Source: Ambulatory Visit | Attending: Vascular Surgery | Admitting: Vascular Surgery

## 2012-10-14 ENCOUNTER — Encounter (HOSPITAL_COMMUNITY)
Admission: RE | Disposition: A | Discharge status: Home / Self Care | Payer: Self-pay | Source: Ambulatory Visit | Attending: Vascular Surgery

## 2012-10-14 DIAGNOSIS — Z8673 Personal history of transient ischemic attack (TIA), and cerebral infarction without residual deficits: Secondary | ICD-10-CM | POA: Insufficient documentation

## 2012-10-14 DIAGNOSIS — Z91199 Patient's noncompliance with other medical treatment and regimen due to unspecified reason: Secondary | ICD-10-CM | POA: Insufficient documentation

## 2012-10-14 DIAGNOSIS — I739 Peripheral vascular disease, unspecified: Secondary | ICD-10-CM

## 2012-10-14 DIAGNOSIS — Z8701 Personal history of pneumonia (recurrent): Secondary | ICD-10-CM | POA: Insufficient documentation

## 2012-10-14 DIAGNOSIS — E119 Type 2 diabetes mellitus without complications: Secondary | ICD-10-CM | POA: Insufficient documentation

## 2012-10-14 DIAGNOSIS — Z794 Long term (current) use of insulin: Secondary | ICD-10-CM | POA: Insufficient documentation

## 2012-10-14 DIAGNOSIS — Z7982 Long term (current) use of aspirin: Secondary | ICD-10-CM | POA: Insufficient documentation

## 2012-10-14 DIAGNOSIS — Z48812 Encounter for surgical aftercare following surgery on the circulatory system: Secondary | ICD-10-CM

## 2012-10-14 DIAGNOSIS — I10 Essential (primary) hypertension: Secondary | ICD-10-CM | POA: Insufficient documentation

## 2012-10-14 DIAGNOSIS — I7092 Chronic total occlusion of artery of the extremities: Secondary | ICD-10-CM | POA: Insufficient documentation

## 2012-10-14 DIAGNOSIS — I428 Other cardiomyopathies: Secondary | ICD-10-CM | POA: Insufficient documentation

## 2012-10-14 DIAGNOSIS — M24559 Contracture, unspecified hip: Secondary | ICD-10-CM | POA: Insufficient documentation

## 2012-10-14 DIAGNOSIS — Z9119 Patient's noncompliance with other medical treatment and regimen: Secondary | ICD-10-CM | POA: Insufficient documentation

## 2012-10-14 DIAGNOSIS — Z96649 Presence of unspecified artificial hip joint: Secondary | ICD-10-CM | POA: Insufficient documentation

## 2012-10-14 DIAGNOSIS — I509 Heart failure, unspecified: Secondary | ICD-10-CM | POA: Insufficient documentation

## 2012-10-14 DIAGNOSIS — I70209 Unspecified atherosclerosis of native arteries of extremities, unspecified extremity: Secondary | ICD-10-CM | POA: Insufficient documentation

## 2012-10-14 DIAGNOSIS — Z79899 Other long term (current) drug therapy: Secondary | ICD-10-CM | POA: Insufficient documentation

## 2012-10-14 DIAGNOSIS — M24569 Contracture, unspecified knee: Secondary | ICD-10-CM | POA: Insufficient documentation

## 2012-10-14 HISTORY — PX: ABDOMINAL AORTAGRAM: SHX5454

## 2012-10-14 LAB — POCT I-STAT, CHEM 8
Chloride: 109 mEq/L (ref 96–112)
Creatinine, Ser: 1.1 mg/dL (ref 0.50–1.35)
Glucose, Bld: 81 mg/dL (ref 70–99)
Potassium: 4.7 mEq/L (ref 3.5–5.1)

## 2012-10-14 LAB — GLUCOSE, CAPILLARY: Glucose-Capillary: 79 mg/dL (ref 70–99)

## 2012-10-14 SURGERY — ABDOMINAL AORTAGRAM
Anesthesia: LOCAL | Laterality: Right

## 2012-10-14 MED ORDER — ACETAMINOPHEN 325 MG PO TABS: 325.0000 mg | ORAL_TABLET | ORAL | Status: DC | PRN

## 2012-10-14 MED ORDER — ACETAMINOPHEN 325 MG RE SUPP: 325.0000 mg | RECTAL | Status: DC | PRN

## 2012-10-14 MED ORDER — HYDRALAZINE HCL 20 MG/ML IJ SOLN
10.0000 mg | INTRAMUSCULAR | Status: DC | PRN
  Administered 2012-10-14: 10 mg via INTRAVENOUS

## 2012-10-14 MED ORDER — SODIUM CHLORIDE 0.9 % IV SOLN
INTRAVENOUS | Status: DC
  Administered 2012-10-14: 07:00:00 via INTRAVENOUS

## 2012-10-14 MED ORDER — MORPHINE SULFATE 10 MG/ML IJ SOLN: 2.0000 mg | INTRAMUSCULAR | Status: DC | PRN

## 2012-10-14 MED ORDER — HYDRALAZINE HCL 20 MG/ML IJ SOLN
INTRAMUSCULAR | Status: AC
  Filled 2012-10-14: qty 1

## 2012-10-14 MED ORDER — METOPROLOL TARTRATE 1 MG/ML IV SOLN
2.0000 mg | INTRAVENOUS | Status: DC | PRN
Start: 2012-10-14 — End: 2012-10-14
  Administered 2012-10-14: 2.5 mg via INTRAVENOUS

## 2012-10-14 MED ORDER — LIDOCAINE HCL (PF) 1 % IJ SOLN
INTRAMUSCULAR | Status: AC
  Filled 2012-10-14: qty 30

## 2012-10-14 MED ORDER — SODIUM CHLORIDE 0.45 % IV SOLN
INTRAVENOUS | Status: DC
  Administered 2012-10-14: 100 mL/h via INTRAVENOUS

## 2012-10-14 MED ORDER — ONDANSETRON HCL 4 MG/2ML IJ SOLN: 4.0000 mg | Freq: Four times a day (QID) | INTRAMUSCULAR | Status: DC | PRN

## 2012-10-14 MED ORDER — METOPROLOL TARTRATE 1 MG/ML IV SOLN
INTRAVENOUS | Status: AC
  Filled 2012-10-14: qty 5

## 2012-10-14 MED ORDER — LABETALOL HCL 5 MG/ML IV SOLN: 10.0000 mg | INTRAVENOUS | Status: DC | PRN

## 2012-10-14 MED ORDER — HEPARIN (PORCINE) IN NACL 2-0.9 UNIT/ML-% IJ SOLN
INTRAMUSCULAR | Status: AC
  Filled 2012-10-14: qty 1000

## 2012-10-14 NOTE — H&P (View-Only) (Signed)
Patient is a 57 year old male who has previous had undergone above-knee pop to posterior tibial artery bypass in November 2013. He subsequently underwent right superficial femoral artery stenting in January of 2014. He has had previous left above-knee amputation. He currently uses his right leg only for transfer. He currently resides in a skilled nursing facility. He has some occasional pain in his foot but this does not sound leg rest pain. He has no ulcerations. He is here today for followup visit. Chronic medical problems include cardiomyopathy with ejection fraction of 20-25%, prior stroke, diabetes, hypertension. These are all currently stable.  Past Medical History  Diagnosis Date  . Cardiomyopathy     EF 20-25% by echo 01/2010 with severe LVH felt secondary to substance abuse (no ischemic workup)  . 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 Diabetes mellitus   . HTN (hypertension)   . Polysubstance abuse     Reported hx of cocaine, THC, heroin, EtOH abuse  . Noncompliance   . Peripheral vascular disease   . CHF (congestive heart failure)   . Pneumonia   . Dependent for walking   . Wheelchair bound   . Acute renal failure     Past Surgical History  Procedure Laterality Date  . Total hip arthroplasty    . Amputation  08/15/2011    Procedure: AMPUTATION ABOVE KNEE;  Surgeon: Sherren Kerns, MD;  Location: [facility] OR;  Service: Vascular;  Laterality: Left;  Amputation End:  1610  . Femoral-tibial bypass graft  12/23/2011    Procedure: BYPASS GRAFT FEMORAL-TIBIAL ARTERY;  Surgeon: Sherren Kerns, MD;  Location: [facility] OR;  Service: Vascular;  Laterality: Right;  Right Popliteal - Tibial Artery Bypass using Reversed Saphenous vein   Family History  Problem Relation Age of Onset  . Stroke Brother    Current Outpatient Prescriptions on File Prior to Visit  Medication Sig Dispense Refill  . aspirin 325 MG tablet Take 325 mg by  mouth daily.      . carvedilol (COREG) 25 MG tablet Take 25 mg by mouth daily.       . citalopram (CELEXA) 20 MG tablet Take 20 mg by mouth daily.       . ferrous sulfate 325 (65 FE) MG tablet Take 325 mg by mouth 3 (three) times daily.      . folic acid (FOLVITE) 1 MG tablet Take 1 mg by mouth daily.      . furosemide (LASIX) 20 MG tablet Take 20 mg by mouth daily.      Marland Kitchen LANTUS SOLOSTAR 100 UNIT/ML injection Inject 5 Units into the skin daily.       Marland Kitchen lisinopril (PRINIVIL,ZESTRIL) 10 MG tablet Take 10 mg by mouth daily.       . traMADol (ULTRAM) 50 MG tablet Take one tablet by mouth twice daily as needed.  60 tablet  5  . docusate sodium (COLACE) 100 MG capsule Take 100 mg by mouth 2 (two) times daily.      Marland Kitchen oxycodone (OXY-IR) 5 MG capsule Take 5-10 mg by mouth every 6 (six) hours as needed. 1 tablet (5 mg) as needed for mild pain, 2 tablets (10 mg) every 6 hours as needed for severe pain       No current facility-administered medications on file prior to visit.    Allergies  Allergen Reactions  . Almond Oil Shortness Of Breath and Swelling  Review of systems: He denies chest pain. He denies shortness of breath.  Physical exam:  Filed Vitals:   10/06/12 1044  BP: 124/73  Pulse: 60  Height: 5\' 7"  (1.702 m)  Weight: 132 lb (59.875 kg)  SpO2: 99%   Right lower extremity: No palpable pedal pulses no ulcerations Chest: Clear to auscultation  Cardiac: Regular rate and rhythm without murmur  Abdomen: Thin soft nontender  Neck: No carotid bruits  Data: Patient had a duplex ultrasound and ABIs of his right lower extremity today. ABI was 0.71 there were increased velocities in the proximal right superficial femoral artery the 429 cm/s area stent and the mid SFA was patent. The bypass was patent.  Assessment: Progressive stenosis right proximal superficial femoral artery currently asymptomatic but compromising inflow to his bypass graft  Plan: Lower extremity arteriogram  possible intervention on 10/14/2012 risk benefits possible complications procedure details were explained the patient today including but not limited to bleeding infection vessel injury contrast reaction. He understands and agrees to proceed  [name], MD [facility] Office: [phone] Pager: [phone]  VASCULAR QUALITY INITIATIVE FOLLOW UP DATA:  Current smoker: [  ] yes  [x  ] no  Living status: [  ]  Home  [x  ] Nursing home  [  ] Homeless    MEDS:  ASA [x  ] yes  [  ] no- [  ] medical reason  [  ] non compliant  STATIN  [ x ] yes  [  ] no- [  ] medical reason  [  ] non compliant  Beta blocker [ x ] yes  [  ] no- [  ] medical reason  [  ] non compliant  ACE inhibitor [x  ] yes  [  ] no- [  ] medical reason  [  ] non compliant  P2Y12 Antagonist [x  ] none  [  ] clopidogrel-Plavix  [  ] ticlopidine-Ticlid   [  ] prasugrel-Effient  [  ] ticagrelor- Brilinta    Anticoagulant [ x ] None  [  ] warfarin  [  ] rivaroxaban-Xarelto [  ] dabigatran- Pradaxa  Ambulation: [  ] Amb  [  ] Amb with assistance  [ x ] wheelchair  [  ] bedridden  Ipsilateral Sx: [ x ] none   [  ] claudication  [  ] rest pain  [  ] tissue loss  Current Patency: [ x ] primary  [  ] primary-assisted  [  ] secondary  [  ] occluded  Patency judged by: [x  ] doppler  [  ] palp graft pulse  [  ] palp distal pulse   [  x] ABI increase > 15%   [  x] Duplex  If occluded, when-   Ipsilateral ABI: 0.71  Ipsilateral TBI:   Infection: [ x ] none  [  ] cellulitis  [  ] deep abscess  [  ] infection of artery or graft  Bypass revision: [ x ] no  [  ] yes- [  ] surg  [  ] catheter based  [  ] both    Date:   Thrombectomy/ lysis/ revision:  [x  ] no  [  ] yes- [  ] surg  [  ] catheter based  [  ] both    Date:   Major amputation: [ x ] no  [  ] minor amp  [  ]  BKA  [  ] AKA   Date:

## 2012-10-14 NOTE — Interval H&P Note (Signed)
History and Physical Interval Note:  10/14/2012 8:32 AM  Jeremy Howell  has presented today for surgery, with the diagnosis of pvd  The various methods of treatment have been discussed with the patient and family. After consideration of risks, benefits and other options for treatment, the patient has consented to  Procedure(s): ABDOMINAL AORTAGRAM (N/A) as a surgical intervention .  The patient's history has been reviewed, patient examined, no change in status, stable for surgery.  I have reviewed the patient's chart and labs.  Questions were answered to the patient's satisfaction.     Arletta Lumadue,Marquavion E

## 2012-10-14 NOTE — Op Note (Signed)
Procedure: Aortogram and right lower extremity runoff  Preoperative diagnosis: right superficial femoral artery stenosis with peripheral arterial disease  Postoperative diagnosis: Same  Anesthesia: Local  Indications: Patient is a 57 year old male who previously had a right popliteal to posterior tibial bypass. He also has a right superficial femoral stent above this. Recent outpatient duplex scan suggested narrowing of the right superficial femoral artery above the level of the stent.  Operative details: After obtaining informed consent, the patient was taken to the [facility] lab. The patient was placed in supine position on the Angio table. Next both groins were prepped and draped in the usual sterile fashion. Ultrasound was used to identify the left common femoral artery. This was slightly technically difficult due to the patient's left hip contracture. The left common femoral artery was identified. This was patent. Local anesthesia was infiltrated over the common femoral artery. After several attempts the left common femoral artery was successfully cannulated. Next an 035 first core wire was threaded up in the abdominal aorta under fluoroscopic guidance. A 5 French sheath was placed in the left common femoral artery. This was thoroughly flushed with heparinized saline. Next a 5 [place] take a catheter was placed over the guidewire and an abdominal aortogram was obtained. The infrarenal abdominal aorta is patent. The left and right common iliac arteries are patent. The left and right external iliac arteries are patent. The internal iliac arteries are occluded bilaterally. Next the pigtail catheter was removed over a guidewire and exchanged for a 5 [place] crossover catheter. This was used to selectively catheterize the right common iliac artery. An 035 angled Glidewire was advanced down into the distal right external iliac artery. The crossover catheter was removed. A 5 French straight catheter was advanced over  the Glidewire into the mid right external iliac artery. A right lower extremity arteriogram was obtained. The right common femoral artery is patent. However there is a 50% stenosis from exophytic plaque in the common femoral artery. The profunda femoris is patent. The native superficial femoral and profunda femoris arteries are patent. There is mid right superficial femoral artery stent which is widely patent. There is a bypass from the above-knee popliteal artery to the posterior tibial artery which is patent. The native popliteal and proximal tibial vessels are all occluded. There is one-vessel runoff to the foot via the posterior tibial artery. The distal anastomosis is patent. The lateral plantar artery of the posterior tibial artery is the only branch feeding the foot. The dorsalis pedis artery is occluded. At this point the straight catheter was removed over a guidewire.  The 5 French sheath was thoroughly flushed heparinized saline. The patient was taken to the holding area in stable condition.  Operative findings: Patent right superficial femoral artery stent and right popliteal to posterior tibial artery bypass. 50% stenosis right common femoral artery.  Operative management: The patient is not a candidate for an open operation due to his severe underlying debility. His 50% stenosis of the right common femoral artery although slightly flow-limiting does not put his bypass at high risk currently. He will need continued observation. The patient has a left hip contracture as well as a right hip and right knee contracture which makes him a marginal operative candidate.  [name], MD [facility] Office: [phone] Pager: [phone]

## 2012-10-14 NOTE — Progress Notes (Signed)
D/C instructions given to caregiver to be given to nurse at [facility]

## 2012-11-07 ENCOUNTER — Non-Acute Institutional Stay (SKILLED_NURSING_FACILITY): Payer: Medicare Other | Admitting: Nurse Practitioner

## 2012-11-07 DIAGNOSIS — E119 Type 2 diabetes mellitus without complications: Secondary | ICD-10-CM

## 2012-11-07 DIAGNOSIS — I739 Peripheral vascular disease, unspecified: Secondary | ICD-10-CM

## 2012-11-07 DIAGNOSIS — D649 Anemia, unspecified: Secondary | ICD-10-CM

## 2012-11-07 DIAGNOSIS — I509 Heart failure, unspecified: Secondary | ICD-10-CM

## 2012-11-07 DIAGNOSIS — I5042 Chronic combined systolic (congestive) and diastolic (congestive) heart failure: Secondary | ICD-10-CM

## 2012-11-07 DIAGNOSIS — I10 Essential (primary) hypertension: Secondary | ICD-10-CM

## 2012-11-07 NOTE — Progress Notes (Signed)
Patient ID: Jeremy Howell, male   DOB: 28-Sep-1955, 57 y.o.   MRN: [id number]  Nursing Home Location:  [facility]   Place of Service: SNF (31)  Chief Complaint  Patient presents with  . Medical Managment of Chronic Issues    HPI:  57 year old who is a long term resident of [facility] is being seen today for routine follow up; pt reports he is doing well and has no complaints and staff without concerns REASSESSMENT OF ONGOING PROBLEMS:  DM, UNCOMPLICATED TYPE II The diabetes remains stable. No noted polyuria, polyphagia, polydipsia, change in vision, foot ulcerations, or hypoglycemic episodes.No complications noted from the medication presently being used. lantus 5 units at night -- A1c in may 2014 was 5.6  ANEMIA taking iron TID with meals  HTN UNSPECIFIED The blood pressure readings have been in the target range. The patient denies chest pain, shortness of breath, dyspnea on exertion, pedal edema, or headache. No complications noted from the medication presently being used. taking coreg 25mg  daily and prinivil 10 mg daily  CVA, LATE EFFECT The neurological status is stable. The patient denies loss of vision, transient ischemic symptoms, sudden weakness or numbness, difficulty swallowing, or confusion. Pt with ongoing aphagia; The patient is tolerating aspirin well.  DEPRESSIVE DISORDER NEC The depression remains stable. on celexa 20mg  daily  CONSTIPATION stable is regularly having BMS- taking colace 100mg  BID    Review of Systems:  Difficult to obtain- pt has expressive aphagia and is a poor historian- see HPI    Medications: Patient's Medications  New Prescriptions   No medications on file  Previous Medications   ASPIRIN 325 MG TABLET    Take 325 mg by mouth daily.   CARVEDILOL (COREG) 25 MG TABLET    Take 25 mg by mouth daily.    CITALOPRAM (CELEXA) 20 MG TABLET    Take 20 mg by mouth daily.    DOCUSATE SODIUM (COLACE) 100 MG CAPSULE    Take 100 mg  by mouth 2 (two) times daily.   FERROUS SULFATE 325 (65 FE) MG TABLET    Take 325 mg by mouth 3 (three) times daily.   FOLIC ACID (FOLVITE) 1 MG TABLET    Take 1 mg by mouth daily.   FUROSEMIDE (LASIX) 20 MG TABLET    Take 20 mg by mouth daily.   LANTUS SOLOSTAR 100 UNIT/ML INJECTION    Inject 5 Units into the skin every evening.    LISINOPRIL (PRINIVIL,ZESTRIL) 10 MG TABLET    Take 10 mg by mouth daily.    OXYCODONE (OXY-IR) 5 MG CAPSULE    Take 5-10 mg by mouth every 6 (six) hours as needed. 1 tablet (5 mg) as needed for mild pain, 2 tablets (10 mg) every 6 hours as needed for severe pain   TRAMADOL (ULTRAM) 50 MG TABLET    Take 50 mg by mouth 2 (two) times daily as needed for pain. For  Mild pain  Modified Medications   No medications on file  Discontinued Medications   No medications on file     Physical Exam:  Filed Vitals:   11/07/12 1123  BP: 133/77  Pulse: 60  Temp: 97.8 F (36.6 C)  Resp: 18  Weight: 140 lb 12.8 oz (63.866 kg)    GENERAL APPEARANCE: Alert, conversant. Appropriately groomed. No acute distress.  SKIN: No diaphoresis rash, or wounds  HEAD: Normocephalic, atraumatic  EYES: Conjunctiva/lids clear bilaterally. Pupils round, reactive. EOMs intact.  EARS: External exam WNL, Hearing grossly normal.  NOSE: No deformity or discharge.  MOUTH/THROAT: Lips w/o lesions. Mouth and throat normal. Tongue moist, w/o lesion.  NECK: No thyroid tenderness, enlargement or nodule  RESPIRATORY: Breathing is even, unlabored. Lung sounds are clear  CARDIOVASCULAR: Heart RRR no murmurs, rubs or gallops. No peripheral edema.  ARTERIAL: radial pulse 2+  GASTROINTESTINAL: Abdomen is soft, non-tender, not distended w/ normal bowel sounds  GENITOURINARY: Bladder non tender, not distended  MUSCULOSKELETAL: left AKA  NEUROLOGIC: Oriented to self, right sided weakness due to CVA  PSYCHIATRIC: Mood and affect appropriate to situation, no behavioral issues  Labs reviewed/Significant  Diagnostic Results: CBC NO Diff (Complete Blood Count)  Result: 08/09/2012 11:24 PM ( Status: F )  WBC 4.7 4.0-10.5 K/uL SLN  RBC 5.20 4.22-5.81 MIL/uL SLN  Hemoglobin 12.8 L 13.0-17.0 g/dL SLN  Hematocrit 40.9 L 39.0-52.0 % SLN  MCV 71.2 L 78.0-100.0 fL SLN  MCH 24.6 L 26.0-34.0 pg SLN  MCHC 34.6 30.0-36.0 g/dL SLN  RDW 81.1 H 91.4-78.2 % SLN  Platelet Count 447 H 150-400 K/uL SLN  Basic Metabolic Panel  Result: 08/10/2012 6:38 AM ( Status: F )  Sodium 136 135-145 mEq/L SLN  Potassium 4.8 3.5-5.3 mEq/L SLN  Chloride 105 96-112 mEq/L SLN  CO2 23 19-32 mEq/L SLN  Glucose 102 H 70-99 mg/dL SLN  BUN 37 H 9-56 mg/dL SLN  Creatinine 2.13 0.86-5.78 mg/dL SLN  Calcium 9.0 4.6-96.2 mg/dL SLN      Assessment/Plan 1. Anemia Follow up cbc and iron panel   2. Diabetes mellitus Stable on current medications; Will follow up A1c  3. 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 HTN (hypertension) Patient is stable; continue current regimen. Will monitor and make changes as necessary. Will follow up bmp  5. PVD (peripheral vascular disease) Stable; taking ASA 325 mg daily

## 2012-11-14 ENCOUNTER — Other Ambulatory Visit: Payer: Self-pay | Admitting: *Deleted

## 2012-11-14 MED ORDER — ALPRAZOLAM 0.5 MG PO TABS: ORAL_TABLET | ORAL | Status: DC

## 2012-11-22 ENCOUNTER — Non-Acute Institutional Stay (SKILLED_NURSING_FACILITY): Payer: Medicare Other | Admitting: Nurse Practitioner

## 2012-11-22 ENCOUNTER — Encounter: Payer: Self-pay | Admitting: Nurse Practitioner

## 2012-11-22 DIAGNOSIS — H1013 Acute atopic conjunctivitis, bilateral: Secondary | ICD-10-CM

## 2012-11-22 DIAGNOSIS — H1045 Other chronic allergic conjunctivitis: Secondary | ICD-10-CM

## 2012-11-22 NOTE — Progress Notes (Signed)
Patient ID: Jeremy Howell, male   DOB: 28-Nov-1955, 57 y.o.   MRN: [id number]  Nursing Home Location:  [facility]   Place of Service: SNF (31)  Chief Complaint  Patient presents with  . Acute Visit    HPI: 57 year old who is a long term resident of [facility] is being seen today per request of nursing due to increase redness and itching to left eye. Denies pain and there has been no drainage; pt was previous placed on course of pataday eye drops that helped but now that they have been stopped pt reports itching is worse.    Review of Systems:   DATA OBTAINED: from patient GENERAL: Feels well no fevers, fatigue, appetite changes EYES: has itching but no eye pain, reports redness, no discharge but will water  EARS: No earache, tinnitus, change in hearing NOSE: No congestion, drainage or bleeding  MOUTH/THROAT: No mouth or tooth pain, No sore throat, No difficulty chewing or swallowing  RESPIRATORY: No cough, wheezing, SOB CARDIAC: No chest pain Medications: Patient's Medications  New Prescriptions   No medications on file  Previous Medications   ALPRAZOLAM (XANAX) 0.5 MG TABLET    Take one tablet by mouth every 6 hours as needed for 14 days   ASPIRIN 325 MG TABLET    Take 325 mg by mouth daily.   CARVEDILOL (COREG) 25 MG TABLET    Take 25 mg by mouth daily.    CITALOPRAM (CELEXA) 20 MG TABLET    Take 20 mg by mouth daily.    DOCUSATE SODIUM (COLACE) 100 MG CAPSULE    Take 100 mg by mouth 2 (two) times daily.   FERROUS SULFATE 325 (65 FE) MG TABLET    Take 325 mg by mouth 3 (three) times daily.   FOLIC ACID (FOLVITE) 1 MG TABLET    Take 1 mg by mouth daily.   FUROSEMIDE (LASIX) 20 MG TABLET    Take 20 mg by mouth daily.   LANTUS SOLOSTAR 100 UNIT/ML INJECTION    Inject 5 Units into the skin every evening.    LISINOPRIL (PRINIVIL,ZESTRIL) 10 MG TABLET    Take 10 mg by mouth daily.    OXYCODONE (OXY-IR) 5 MG CAPSULE    Take 5-10 mg by mouth every 6  (six) hours as needed. 1 tablet (5 mg) as needed for mild pain, 2 tablets (10 mg) every 6 hours as needed for severe pain   TRAMADOL (ULTRAM) 50 MG TABLET    Take 50 mg by mouth 2 (two) times daily as needed for pain. For  Mild pain  Modified Medications   No medications on file  Discontinued Medications   No medications on file     Physical Exam:  Filed Vitals:   11/22/12 1340  BP: 131/81  Pulse: 54  Temp: 97.3 F (36.3 C)  Resp: 18      GENERAL APPEARANCE: Alert, conversant. Appropriately groomed. No acute distress.  SKIN: No diaphoresis rash, or wounds HEAD: Normocephalic, atraumatic  EYES: Conjunctiva/lids slightly injected. Pupils round, reactive. EOMs intact.  EARS: External exam WNL NOSE: No deformity or discharge.  MOUTH/THROAT: Lips w/o lesions. Mouth and throat normal. Tongue moist, w/o lesion.  RESPIRATORY: Breathing is even, unlabored. Lung sounds are clear   CARDIOVASCULAR: Heart RRR no murmurs, rubs or gallops. No peripheral edema.  ARTERIAL: radial pulse 2+ Labs reviewed/Significant Diagnostic Results: CBC NO Diff (Complete Blood Count)  Result: 08/09/2012 11:24 PM ( Status: F )  WBC 4.7 4.0-10.5 K/uL SLN  RBC 5.20 4.22-5.81 MIL/uL SLN  Hemoglobin 12.8 L 13.0-17.0 g/dL SLN  Hematocrit 16.1 L 39.0-52.0 % SLN  MCV 71.2 L 78.0-100.0 fL SLN  MCH 24.6 L 26.0-34.0 pg SLN  MCHC 34.6 30.0-36.0 g/dL SLN  RDW 09.6 H 04.5-40.9 % SLN  Platelet Count 447 H 150-400 K/uL SLN  Basic Metabolic Panel  Result: 08/10/2012 6:38 AM ( Status: F )  Sodium 136 135-145 mEq/L SLN  Potassium 4.8 3.5-5.3 mEq/L SLN  Chloride 105 96-112 mEq/L SLN  CO2 23 19-32 mEq/L SLN  Glucose 102 H 70-99 mg/dL SLN  BUN 37 H 8-11 mg/dL SLN  Creatinine 9.14 7.82-9.56 mg/dL SLN  Calcium 9.0 2.1-30.8 mg/dL SLN      Assessment/Plan 1. Allergic conjunctivitis, bilateral Will start pataday daily into bilateral eyes

## 2012-11-29 ENCOUNTER — Encounter: Payer: Self-pay | Admitting: Nurse Practitioner

## 2012-11-29 ENCOUNTER — Non-Acute Institutional Stay (SKILLED_NURSING_FACILITY): Payer: Medicare Other | Admitting: Nurse Practitioner

## 2012-11-29 DIAGNOSIS — E119 Type 2 diabetes mellitus without complications: Secondary | ICD-10-CM

## 2012-11-29 DIAGNOSIS — I10 Essential (primary) hypertension: Secondary | ICD-10-CM

## 2012-11-29 DIAGNOSIS — D649 Anemia, unspecified: Secondary | ICD-10-CM

## 2012-11-29 DIAGNOSIS — I739 Peripheral vascular disease, unspecified: Secondary | ICD-10-CM

## 2012-11-29 DIAGNOSIS — F411 Generalized anxiety disorder: Secondary | ICD-10-CM

## 2012-11-29 NOTE — Progress Notes (Signed)
Patient ID: Jeremy Howell, male   DOB: [birth year], 57 y.o.   MRN: [id number]   PCP: Kimber Relic, MD   Allergies  Allergen Reactions  . Almond Oil Shortness Of Breath and Swelling    Chief Complaint  Patient presents with  . Medical Managment of Chronic Issues    HPI:  57 year old who is a long term resident of [facility] is being seen today for routine follow up; pt reports he is doing well and has no complaints and staff without concerns  Pts ex-wife who was also a long time friend died in the last month; pt appears to be coping  appropriately; no increase in agitation or behaviors  Red eye has improved; pt reports still itchy at times REASSESSMENT OF ONGOING PROBLEMS:  DM, UNCOMPLICATED TYPE II The diabetes remains stable. No noted polyuria, polyphagia, polydipsia, change in vision, foot ulcerations, or hypoglycemic episodes.No complications noted from the medication presently being used. lantus 5 units at night -- A1c in may 2014 was 5.6  ANEMIA; worse on recent labs taking iron TID with meals  HTN UNSPECIFIED The blood pressure readings have been in the target range. The patient denies chest pain, shortness of breath, dyspnea on exertion, pedal edema, or headache. No complications noted from the medication presently being used. taking coreg 25mg  daily and prinivil 10 mg daily  CVA, LATE EFFECT The neurological status is stable. The patient denies loss of vision, transient ischemic symptoms, sudden weakness or numbness, difficulty swallowing, or confusion. Pt with ongoing aphagia; The patient is tolerating aspirin well.  DEPRESSIVE DISORDER NEC The depression remains stable. on celexa 20mg  daily  CONSTIPATION stable is regularly having BMS- taking colace 100mg  BID   Review of Systems:  Limited due to aphagia Review of Systems  Constitutional: Negative for fever and chills.  Eyes: Negative for pain and redness.  Respiratory: Negative for cough and shortness of  breath.   Cardiovascular: Negative for chest pain.  Gastrointestinal: Negative for abdominal pain, diarrhea and constipation.  Genitourinary: Negative for dysuria.  Musculoskeletal: Negative for joint pain and myalgias.  Neurological: Negative for headaches.  Psychiatric/Behavioral: Negative for depression. The patient is not nervous/anxious.      Past Medical History  Diagnosis Date  . Cardiomyopathy     EF 20-25% by echo 01/2010 with severe LVH felt secondary to substance abuse (no ischemic workup)  . 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 Diabetes mellitus   . HTN (hypertension)   . Polysubstance abuse     Reported hx of cocaine, THC, heroin, EtOH abuse  . Noncompliance   . Peripheral vascular disease   . CHF (congestive heart failure)   . Pneumonia   . Dependent for walking   . Wheelchair bound   . Acute renal failure    Past Surgical History  Procedure Laterality Date  . Total hip arthroplasty    . Amputation  08/15/2011    Procedure: AMPUTATION ABOVE KNEE;  Surgeon: Sherren Kerns, MD;  Location: [facility] OR;  Service: Vascular;  Laterality: Left;  Amputation End:  4098  . Femoral-tibial bypass graft  12/23/2011    Procedure: BYPASS GRAFT FEMORAL-TIBIAL ARTERY;  Surgeon: Sherren Kerns, MD;  Location: [facility] OR;  Service: Vascular;  Laterality: Right;  Right Popliteal - Tibial Artery Bypass using Reversed Saphenous vein   Social History:   reports that he has quit smoking.  His smoking use included Cigarettes. He smoked 0.00 packs per day for 15 years. He uses smokeless tobacco. He reports that he does not drink alcohol or use illicit drugs.  Family History  Problem Relation Age of Onset  . Stroke Brother     Medications: Patient's Medications  New Prescriptions   No medications on file  Previous Medications   ALPRAZOLAM (XANAX) 0.5 MG TABLET    Take one tablet by mouth every 6 hours as needed for 14  days   ASPIRIN 325 MG TABLET    Take 325 mg by mouth daily.   CARVEDILOL (COREG) 25 MG TABLET    Take 25 mg by mouth daily.    CITALOPRAM (CELEXA) 20 MG TABLET    Take 20 mg by mouth daily.    DOCUSATE SODIUM (COLACE) 100 MG CAPSULE    Take 100 mg by mouth 2 (two) times daily.   FERROUS SULFATE 325 (65 FE) MG TABLET    Take 325 mg by mouth 3 (three) times daily.   FOLIC ACID (FOLVITE) 1 MG TABLET    Take 1 mg by mouth daily.   FUROSEMIDE (LASIX) 20 MG TABLET    Take 20 mg by mouth daily.   LANTUS SOLOSTAR 100 UNIT/ML INJECTION    Inject 5 Units into the skin every evening.    LISINOPRIL (PRINIVIL,ZESTRIL) 10 MG TABLET    Take 10 mg by mouth daily.    OXYCODONE (OXY-IR) 5 MG CAPSULE    Take 5-10 mg by mouth every 6 (six) hours as needed. 1 tablet (5 mg) as needed for mild pain, 2 tablets (10 mg) every 6 hours as needed for severe pain   TRAMADOL (ULTRAM) 50 MG TABLET    Take 50 mg by mouth 2 (two) times daily as needed for pain. For  Mild pain  Modified Medications   No medications on file  Discontinued Medications   No medications on file     Physical Exam:  Filed Vitals:   11/29/12 1614  BP: 128/70  Pulse: 80  Temp: 97.9 F (36.6 C)  Resp: 20    GENERAL APPEARANCE: Alert, conversant. Appropriately groomed. No acute distress.  SKIN: No diaphoresis rash, or wounds HEAD: Normocephalic, atraumatic  EYES: Conjunctiva/lids clear. Pupils round, reactive. EOMs intact.  EARS: External exam WNL, Hearing grossly normal.  NOSE: No deformity or discharge.  MOUTH/THROAT: Lips w/o lesions. Mouth and throat normal. Tongue moist, w/o lesion.  NECK: No thyroid tenderness, enlargement or nodule  RESPIRATORY: Breathing is even, unlabored. Lung sounds are clear   CARDIOVASCULAR: Heart RRR no murmurs, rubs or gallops. No peripheral edema.  ARTERIAL: radial pulse 2+ GASTROINTESTINAL: Abdomen is soft, non-tender, not distended w/ normal bowel sounds. GENITOURINARY: Bladder non tender, not  distended  MUSCULOSKELETAL: L AKA NEUROLOGIC: oriented x 2; right sided weakness due to CVA PSYCHIATRIC: Mood and affect appropriate to situation, no behavioral issues   Labs reviewed: Basic Metabolic Panel:  Recent Labs  16/10/96 1144 12/24/11 0440 02/12/12 0554 10/14/12 0745  NA 137 132* 144 140  K 4.6 4.0 4.2 4.7  CL 100 98 105 109  CO2 24 23  --   --   GLUCOSE 88 79 88 81  BUN 18 13 16 19   CREATININE 0.84 0.90 0.90 1.10  CALCIUM 10.2 9.4  --   --    Liver Function Tests:  Recent Labs  12/22/11 1144  AST 23  ALT 33  ALKPHOS 93  BILITOT 0.2*  PROT 8.3  ALBUMIN 3.5  No results found for this basename: LIPASE, AMYLASE,  in the last 8760 hours No results found for this basename: AMMONIA,  in the last 8760 hours CBC:  Recent Labs  12/22/11 1144 12/24/11 0440 02/12/12 0554 10/14/12 0745  WBC 4.3 6.0  --   --   HGB 13.2 11.6* 15.0 15.3  HCT 41.1 35.7* 44.0 45.0  MCV 79.8 79.3  --   --   PLT 389 334  --   --    Cardiac Enzymes: No results found for this basename: CKTOTAL, CKMB, CKMBINDEX, TROPONINI,  in the last 8760 hours BNP: No components found with this basename: POCBNP,  CBG:  Recent Labs  02/12/12 0941 10/14/12 1009 10/14/12 1211  GLUCAP 85 74 79  CBC NO Diff (Complete Blood Count)  Result: 08/09/2012 11:24 PM ( Status: F )  WBC 4.7 4.0-10.5 K/uL SLN  RBC 5.20 4.22-5.81 MIL/uL SLN  Hemoglobin 12.8 L 13.0-17.0 g/dL SLN  Hematocrit 40.9 L 39.0-52.0 % SLN  MCV 71.2 L 78.0-100.0 fL SLN  MCH 24.6 L 26.0-34.0 pg SLN  MCHC 34.6 30.0-36.0 g/dL SLN  RDW 81.1 H 91.4-78.2 % SLN  Platelet Count 447 H 150-400 K/uL SLN  Basic Metabolic Panel  Result: 08/10/2012 6:38 AM ( Status: F )  Sodium 136 135-145 mEq/L SLN  Potassium 4.8 3.5-5.3 mEq/L SLN  Chloride 105 96-112 mEq/L SLN  CO2 23 19-32 mEq/L SLN  Glucose 102 H 70-99 mg/dL SLN  BUN 37 H 9-56 mg/dL SLN  Creatinine 2.13 0.86-5.78 mg/dL SLN  Calcium 9.0 4.6-96.2 mg/dL SLN   CBC with Diff        Result: 11/17/2011 2:47 PM    ( Status: F )            WBC  4.2        4.0-10.5  K/uL  SLN       RBC  3.42     L  4.22-5.81  MIL/uL  SLN       Hemoglobin  8.7     L  13.0-17.0  g/dL  SLN       Hematocrit  26.3     L  39.0-52.0  %  SLN       MCV  76.9     L  78.0-100.0  fL  SLN       MCH  25.4     L  26.0-34.0  pg  SLN       MCHC  33.1        30.0-36.0  g/dL  SLN       RDW  95.2        11.5-15.5  %  SLN       Platelet Count  392        150-400  K/uL  SLN       Granulocyte %  48        43-77  %  SLN       Absolute Gran  2.0        1.7-7.7  K/uL  SLN       Lymph %  30        12-46  %  SLN       Absolute Lymph  1.3        0.7-4.0  K/uL  SLN       Mono %  12        3-12  %  SLN       Absolute Mono  0.5  0.1-1.0  K/uL  SLN       Eos %  9     H  0-5  %  SLN       Absolute Eos  0.4        0.0-0.7  K/uL  SLN       Baso %  1        0-1  %  SLN       Absolute Baso  0.0        0.0-0.1  K/uL  SLN       Smear Review  Criteria for review not met   SLN      Sed Rate (ESR)       Result: 11/17/2011 5:42 PM    ( Status: F )            Sed Rate (ESR)  63     H  0-16  mm/hr  SLN      Comprehensive Metabolic Panel       Result: 11/17/2011 2:59 PM    ( Status: F )            Sodium  142        135-145  mEq/L  SLN       Potassium  3.6        3.5-5.3  mEq/L  SLN       Chloride  105        96-112  mEq/L  SLN       CO2  30        19-32  mEq/L  SLN       Glucose  78        70-99  mg/dL  SLN       BUN  8        6-23  mg/dL  SLN       Creatinine  0.72        0.50-1.35  mg/dL  SLN       Bilirubin, Total  0.2     L  0.3-1.2  mg/dL  SLN       Alkaline Phosphatase  65        39-117  U/L  SLN       AST/SGOT  22        0-37  U/L  SLN       ALT/SGPT  15        0-53  U/L  SLN       Total Protein  6.2        6.0-8.3  g/dL  SLN       Albumin  2.9     L  3.5-5.2  g/dL  SLN       Calcium  8.7        8.4-10.5  mg/dL  SLN      Uric Acid       Result: 11/17/2011 2:59 PM    ( Status: F )            Uric Acid  2.1      L  4.0-7.8  mg/dL  SLN      Assessment/Plan 1. HTN (hypertension) Blood pressure stable on current medications  2. PVD (peripheral vascular disease) Stable on current medications    3. Diabetes mellitus Patient is stable; continue current regimen. Will monitor and make changes as necessary.  4. Anemia Worse; will have staff redraw will iron panel; will also hemocult stools x3  5. Anxiety  Stable on current medications

## 2013-01-02 ENCOUNTER — Telehealth: Payer: Self-pay | Admitting: Licensed Clinical Social Worker

## 2013-01-02 NOTE — Telephone Encounter (Signed)
error 

## 2013-01-03 ENCOUNTER — Encounter: Payer: Self-pay | Admitting: Nurse Practitioner

## 2013-01-03 ENCOUNTER — Non-Acute Institutional Stay (SKILLED_NURSING_FACILITY): Payer: Medicare Other | Admitting: Nurse Practitioner

## 2013-01-03 DIAGNOSIS — E119 Type 2 diabetes mellitus without complications: Secondary | ICD-10-CM

## 2013-01-03 DIAGNOSIS — I5042 Chronic combined systolic (congestive) and diastolic (congestive) heart failure: Secondary | ICD-10-CM

## 2013-01-03 DIAGNOSIS — I10 Essential (primary) hypertension: Secondary | ICD-10-CM

## 2013-01-03 DIAGNOSIS — I509 Heart failure, unspecified: Secondary | ICD-10-CM

## 2013-01-03 DIAGNOSIS — F329 Major depressive disorder, single episode, unspecified: Secondary | ICD-10-CM

## 2013-01-03 DIAGNOSIS — Z8673 Personal history of transient ischemic attack (TIA), and cerebral infarction without residual deficits: Secondary | ICD-10-CM

## 2013-01-03 DIAGNOSIS — F341 Dysthymic disorder: Secondary | ICD-10-CM

## 2013-01-03 DIAGNOSIS — F32A Depression, unspecified: Secondary | ICD-10-CM

## 2013-01-03 DIAGNOSIS — D649 Anemia, unspecified: Secondary | ICD-10-CM

## 2013-01-03 NOTE — Progress Notes (Signed)
Patient ID: Jeremy Howell, male   DOB: 1955/06/18, 57 y.o.   MRN: [id number]    Nursing Home Location:  [facility]   Place of Service: SNF (31)  PCP: Kimber Relic, MD  Allergies  Allergen Reactions  . Almond Oil Shortness Of Breath and Swelling    Chief Complaint  Patient presents with  . Medical Managment of Chronic Issues    HPI:  57 year old who is a long term resident of [facility] is being seen today for routine follow up; pt reports he is doing well and has no complaints and staff without concerns  Pt has had 3 hem positive stools  REASSESSMENT OF ONGOING PROBLEMS:  DM, UNCOMPLICATED TYPE II The diabetes remains stable. No noted polyuria, polyphagia, polydipsia, change in vision, foot ulcerations, or hypoglycemic episodes.No complications noted from the medication presently being used. lantus 5 units at night  ANEMIA; improved;  taking iron TID with meals  HTN UNSPECIFIED The blood pressure readings have been in the target range. The patient denies chest pain, shortness of breath, dyspnea on exertion, pedal edema, or headache. No complications noted from the medication presently being used. taking coreg 25mg  daily and prinivil 10 mg daily  CVA, LATE EFFECT The neurological status is stable. The patient denies loss of vision, transient ischemic symptoms, sudden weakness or numbness, difficulty swallowing, or confusion. Pt with ongoing aphagia; The patient is tolerating aspirin well.  DEPRESSIVE DISORDER NEC The depression remains stable. on celexa 20mg  daily  CONSTIPATION stable is regularly having BMS- taking colace 100mg  BID    Review of Systems:  Limited due to aphagia  Review of Systems  Constitutional: Negative for fever and chills.  Eyes: Negative for pain and redness.  Respiratory: Negative for cough and shortness of breath.  Cardiovascular: Negative for chest pain.  Gastrointestinal: Negative for abdominal pain, diarrhea and  constipation.  Genitourinary: Negative for dysuria.  Musculoskeletal: Negative for joint pain and myalgias.  Neurological: Negative for headaches.  Psychiatric/Behavioral: Negative for depression. The patient is not nervous/anxious.     Past Medical History  Diagnosis Date  . Cardiomyopathy     EF 20-25% by echo 01/2010 with severe LVH felt secondary to substance abuse (no ischemic workup)  . 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 Diabetes mellitus   . HTN (hypertension)   . Polysubstance abuse     Reported hx of cocaine, THC, heroin, EtOH abuse  . Noncompliance   . Peripheral vascular disease   . CHF (congestive heart failure)   . Pneumonia   . Dependent for walking   . Wheelchair bound   . Acute renal failure    Past Surgical History  Procedure Laterality Date  . Total hip arthroplasty    . Amputation  08/15/2011    Procedure: AMPUTATION ABOVE KNEE;  Surgeon: Sherren Kerns, MD;  Location: [facility] OR;  Service: Vascular;  Laterality: Left;  Amputation End:  4098  . Femoral-tibial bypass graft  12/23/2011    Procedure: BYPASS GRAFT FEMORAL-TIBIAL ARTERY;  Surgeon: Sherren Kerns, MD;  Location: [facility] OR;  Service: Vascular;  Laterality: Right;  Right Popliteal - Tibial Artery Bypass using Reversed Saphenous vein   Social History:   reports that he has quit smoking. His smoking use included Cigarettes. He smoked 0.00 packs per day for 15 years. He uses smokeless tobacco. He reports that he does  not drink alcohol or use illicit drugs.  Family History  Problem Relation Age of Onset  . Stroke Brother     Medications: Patient's Medications  New Prescriptions   No medications on file  Previous Medications   ALPRAZOLAM (XANAX) 0.5 MG TABLET    Take one tablet by mouth every 6 hours as needed for 14 days   ASPIRIN 325 MG TABLET    Take 325 mg by mouth daily.   CARVEDILOL (COREG) 25 MG TABLET    Take 25 mg by mouth  daily.    CITALOPRAM (CELEXA) 20 MG TABLET    Take 20 mg by mouth daily.    DOCUSATE SODIUM (COLACE) 100 MG CAPSULE    Take 100 mg by mouth 2 (two) times daily.   FERROUS SULFATE 325 (65 FE) MG TABLET    Take 325 mg by mouth 3 (three) times daily.   FOLIC ACID (FOLVITE) 1 MG TABLET    Take 1 mg by mouth daily.   FUROSEMIDE (LASIX) 20 MG TABLET    Take 20 mg by mouth daily.   LANTUS SOLOSTAR 100 UNIT/ML INJECTION    Inject 5 Units into the skin every evening.    LISINOPRIL (PRINIVIL,ZESTRIL) 10 MG TABLET    Take 10 mg by mouth daily.    OXYCODONE (OXY-IR) 5 MG CAPSULE    Take 5-10 mg by mouth every 6 (six) hours as needed. 1 tablet (5 mg) as needed for mild pain, 2 tablets (10 mg) every 6 hours as needed for severe pain   TRAMADOL (ULTRAM) 50 MG TABLET    Take 50 mg by mouth 2 (two) times daily as needed for pain. For  Mild pain  Modified Medications   No medications on file  Discontinued Medications   No medications on file     Physical Exam:  Filed Vitals:   01/03/13 1211  BP: 145/85  Pulse: 68  Temp: 98.7 F (37.1 C)  Resp: 18  Weight: 142 lb (64.411 kg)    GENERAL APPEARANCE: Alert, conversant. Appropriately groomed. No acute distress.  SKIN: No diaphoresis rash, or wounds  HEENT: unremarkable  NECK: No thyroid tenderness, enlargement or nodule  RESPIRATORY: Breathing is even, unlabored. Lung sounds are clear  CARDIOVASCULAR: Heart RRR no murmurs, rubs or gallops. No peripheral edema.  ARTERIAL: radial pulse 2+  GASTROINTESTINAL: Abdomen is soft, non-tender, not distended w/ normal bowel sounds.  GENITOURINARY: Bladder non tender, not distended  MUSCULOSKELETAL: L AKA  NEUROLOGIC: oriented x 2; right sided weakness due to CVA  PSYCHIATRIC: Mood and affect appropriate to situation, no behavioral issues   Labs reviewed: Basic Metabolic Panel:  Recent Labs  78/29/56 0554 10/14/12 0745  NA 144 140  K 4.2 4.7  CL 105 109  GLUCOSE 88 81  BUN 16 19  CREATININE 0.90  1.10   CBC NO Diff (Complete Blood Count)  Result: 08/09/2012 11:24 PM ( Status: F )  WBC 4.7 4.0-10.5 K/uL SLN  RBC 5.20 4.22-5.81 MIL/uL SLN  Hemoglobin 12.8 L 13.0-17.0 g/dL SLN  Hematocrit 21.3 L 39.0-52.0 % SLN  MCV 71.2 L 78.0-100.0 fL SLN  MCH 24.6 L 26.0-34.0 pg SLN  MCHC 34.6 30.0-36.0 g/dL SLN  RDW 08.6 H 57.8-46.9 % SLN  Platelet Count 447 H 150-400 K/uL SLN  Basic Metabolic Panel  Result: 08/10/2012 6:38 AM ( Status: F )  Sodium 136 135-145 mEq/L SLN  Potassium 4.8 3.5-5.3 mEq/L SLN  Chloride 105 96-112 mEq/L SLN  CO2 23 19-32 mEq/L SLN  Glucose 102 H 70-99 mg/dL SLN  BUN 37 H 1-61 mg/dL SLN  Creatinine 0.96 0.45-4.09 mg/dL SLN  Calcium 9.0 8.1-19.1 mg/dL SLN  CBC with Diff  Result: 11/17/2011 2:47 PM ( Status: F )  WBC 4.2 4.0-10.5 K/uL SLN  RBC 3.42 L 4.22-5.81 MIL/uL SLN  Hemoglobin 8.7 L 13.0-17.0 g/dL SLN  Hematocrit 47.8 L 39.0-52.0 % SLN  MCV 76.9 L 78.0-100.0 fL SLN  MCH 25.4 L 26.0-34.0 pg SLN  MCHC 33.1 30.0-36.0 g/dL SLN  RDW 29.5 62.1-30.8 % SLN  Platelet Count 392 150-400 K/uL SLN  Granulocyte % 48 43-77 % SLN  Absolute Gran 2.0 1.7-7.7 K/uL SLN  Lymph % 30 12-46 % SLN  Absolute Lymph 1.3 0.7-4.0 K/uL SLN  Mono % 12 3-12 % SLN  Absolute Mono 0.5 0.1-1.0 K/uL SLN  Eos % 9 H 0-5 % SLN  Absolute Eos 0.4 0.0-0.7 K/uL SLN  Baso % 1 0-1 % SLN  Absolute Baso 0.0 0.0-0.1 K/uL SLN  Smear Review Criteria for review not met SLN  Sed Rate (ESR)  Result: 11/17/2011 5:42 PM ( Status: F )  Sed Rate (ESR) 63 H 0-16 mm/hr SLN  Comprehensive Metabolic Panel  Result: 11/17/2011 2:59 PM ( Status: F )  Sodium 142 135-145 mEq/L SLN  Potassium 3.6 3.5-5.3 mEq/L SLN  Chloride 105 96-112 mEq/L SLN  CO2 30 19-32 mEq/L SLN  Glucose 78 70-99 mg/dL SLN  BUN 8 6-57 mg/dL SLN  Creatinine 8.46 9.62-9.52 mg/dL SLN  Bilirubin, Total 0.2 L 0.3-1.2 mg/dL SLN  Alkaline Phosphatase 65 39-117 U/L SLN  AST/SGOT 22 0-37 U/L SLN  ALT/SGPT 15 0-53 U/L SLN  Total Protein 6.2  6.0-8.3 g/dL SLN  Albumin 2.9 L 8.4-1.3 g/dL SLN  Calcium 8.7 2.4-40.1 mg/dL SLN  Uric Acid  Result: 11/17/2011 2:59 PM ( Status: F )  Uric Acid 2.1 L 4.0-7.8 mg/dL SLN   Iron and IBC       Result: 12/02/2012 1:35 AM    ( Status: F )            Iron  80        42-165  ug/dL  SLN       UIBC  027        125-400  ug/dL  SLN       TIBC  253        215-435  ug/dL  SLN       %SAT  25        20-55  %  SLN      CBC NO Diff (Complete Blood Count)       Result: 12/01/2012 11:07 PM    ( Status: F )            WBC  3.7     L  4.0-10.5  K/uL  SLN       RBC  5.47        4.22-5.81  MIL/uL  SLN       Hemoglobin  14.5        13.0-17.0  g/dL  SLN       Hematocrit  43.7        39.0-52.0  %  SLN       MCV  79.9        78.0-100.0  fL  SLN       MCH  26.5        26.0-34.0  pg  SLN       MCHC  33.2        30.0-36.0  g/dL  SLN       RDW  14.7        11.5-15.5  %  SLN       Platelet Count  337        150-400  K/uL  SLN      Ferritin       Result: 12/02/2012 2:10 AM    ( Status: F )            Ferritin  114        22-322  ng/mL  SLN     Assessment/Plan 1. Diabetes mellitus - blood sugars remain stable; will cont current medications  2. Depression/anxiety -remains stable on current medications  3. HTN (hypertension) -Patient is stable; continue current regimen. Will monitor and make changes as necessary.  4. 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 History of CVA (cerebrovascular accident) -with aphagia and right sided weakness; no changes in symptoms   6. Anemia -improved on last labs -with hem positive stools x 3 - pt has never had screening colonoscopy will now refer to GI for colonoscopy

## 2013-02-07 ENCOUNTER — Encounter: Payer: Self-pay | Admitting: Family

## 2013-02-07 ENCOUNTER — Ambulatory Visit (HOSPITAL_COMMUNITY)
Admission: RE | Admit: 2013-02-07 | Discharge: 2013-02-07 | Disposition: A | Discharge status: Home / Self Care | Payer: PRIVATE HEALTH INSURANCE | Source: Ambulatory Visit | Attending: Family | Admitting: Family

## 2013-02-07 ENCOUNTER — Ambulatory Visit (INDEPENDENT_AMBULATORY_CARE_PROVIDER_SITE_OTHER): Payer: PRIVATE HEALTH INSURANCE | Admitting: Family

## 2013-02-07 ENCOUNTER — Other Ambulatory Visit: Payer: Self-pay | Admitting: Vascular Surgery

## 2013-02-07 VITALS — BP 105/78 | HR 66 | Resp 16 | Ht 67.0 in | Wt 120.4 lb

## 2013-02-07 DIAGNOSIS — I739 Peripheral vascular disease, unspecified: Secondary | ICD-10-CM

## 2013-02-07 DIAGNOSIS — Z48812 Encounter for surgical aftercare following surgery on the circulatory system: Secondary | ICD-10-CM

## 2013-02-07 DIAGNOSIS — M79609 Pain in unspecified limb: Secondary | ICD-10-CM

## 2013-02-07 DIAGNOSIS — M79606 Pain in leg, unspecified: Secondary | ICD-10-CM | POA: Insufficient documentation

## 2013-02-07 NOTE — Patient Instructions (Signed)
Peripheral Vascular Disease Peripheral Vascular Disease (PVD), also called Peripheral Arterial Disease (PAD), is a circulation problem caused by cholesterol (atherosclerotic plaque) deposits in the arteries. PVD commonly occurs in the lower extremities (legs) but it can occur in other areas of the body, such as your arms. The cholesterol buildup in the arteries reduces blood flow which can cause pain and other serious problems. The presence of PVD can place a person at risk for Coronary Artery Disease (CAD).  CAUSES  Causes of PVD can be many. It is usually associated with more than one risk factor such as:   High Cholesterol.  Smoking.  Diabetes.  Lack of exercise or inactivity.  High blood pressure (hypertension).  Obesity.  Family history. SYMPTOMS   When the lower extremities are affected, patients with PVD may experience:  Leg pain with exertion or physical activity. This is called INTERMITTENT CLAUDICATION. This may present as cramping or numbness with physical activity. The location of the pain is associated with the level of blockage. For example, blockage at the abdominal level (distal abdominal aorta) may result in buttock or hip pain. Lower leg arterial blockage may result in calf pain.  As PVD becomes more severe, pain can develop with less physical activity.  In people with severe PVD, leg pain may occur at rest.  Other PVD signs and symptoms:  Leg numbness or weakness.  Coldness in the affected leg or foot, especially when compared to the other leg.  A change in leg color.  Patients with significant PVD are more prone to ulcers or sores on toes, feet or legs. These may take longer to heal or may reoccur. The ulcers or sores can become infected.  If signs and symptoms of PVD are ignored, gangrene may occur. This can result in the loss of toes or loss of an entire limb.  Not all leg pain is related to PVD. Other medical conditions can cause leg pain such  as:  Blood clots (embolism) or Deep Vein Thrombosis.  Inflammation of the blood vessels (vasculitis).  Spinal stenosis. DIAGNOSIS  Diagnosis of PVD can involve several different types of tests. These can include:  Pulse Volume Recording Method (PVR). This test is simple, painless and does not involve the use of X-rays. PVR involves measuring and comparing the blood pressure in the arms and legs. An ABI (Ankle-Brachial Index) is calculated. The normal ratio of blood pressures is 1. As this number becomes smaller, it indicates more severe disease.  < 0.95  indicates significant narrowing in one or more leg vessels.  <0.8 there will usually be pain in the foot, leg or buttock with exercise.  <0.4 will usually have pain in the legs at rest.  <0.25  usually indicates limb threatening PVD.  Doppler detection of pulses in the legs. This test is painless and checks to see if you have a pulses in your legs/feet.  A dye or contrast material (a substance that highlights the blood vessels so they show up on x-ray) may be given to help your caregiver better see the arteries for the following tests. The dye is eliminated from your body by the kidney's. Your caregiver may order blood work to check your kidney function and other laboratory values before the following tests are performed:  Magnetic Resonance Angiography (MRA). An MRA is a picture study of the blood vessels and arteries. The MRA machine uses a large magnet to produce images of the blood vessels.  Computed Tomography Angiography (CTA). A CTA is a   specialized x-ray that looks at how the blood flows in your blood vessels. An IV may be inserted into your arm so contrast dye can be injected.  Angiogram. Is a procedure that uses x-rays to look at your blood vessels. This procedure is minimally invasive, meaning a small incision (cut) is made in your groin. A small tube (catheter) is then inserted into the artery of your groin. The catheter is  guided to the blood vessel or artery your caregiver wants to examine. Contrast dye is injected into the catheter. X-rays are then taken of the blood vessel or artery. After the images are obtained, the catheter is taken out. TREATMENT  Treatment of PVD involves many interventions which may include:  Lifestyle changes:  Quitting smoking.  Exercise.  Following a low fat, low cholesterol diet.  Control of diabetes.  Foot care is very important to the PVD patient. Good foot care can help prevent infection.  Medication:  Cholesterol-lowering medicine.  Blood pressure medicine.  Anti-platelet drugs.  Certain medicines may reduce symptoms of Intermittent Claudication.  Interventional/Surgical options:  Angioplasty. An Angioplasty is a procedure that inflates a balloon in the blocked artery. This opens the blocked artery to improve blood flow.  Stent Implant. A wire mesh tube (stent) is placed in the artery. The stent expands and stays in place, allowing the artery to remain open.  Peripheral Bypass Surgery. This is a surgical procedure that reroutes the blood around a blocked artery to help improve blood flow. This type of procedure may be performed if Angioplasty or stent implants are not an option. SEEK IMMEDIATE MEDICAL CARE IF:   You develop pain or numbness in your arms or legs.  Your arm or leg turns cold, becomes blue in color.  You develop redness, warmth, swelling and pain in your arms or legs. MAKE SURE YOU:   Understand these instructions.  Will watch your condition.  Will get help right away if you are not doing well or get worse. Document Released: 03/05/2004 Document Revised: 04/20/2011 Document Reviewed: 01/31/2008 [employer] Patient Information 2014 [employer].  

## 2013-02-07 NOTE — Progress Notes (Signed)
[facility] [place] HISTORY AND PHYSICAL -PAD  History of Present Illness Jeremy Howell is a 57 y.o. male patient of Dr. Darrick Penna who is s/p above-knee pop to posterior tibial artery bypass in November 2013. He subsequently underwent right superficial femoral artery stenting in January of 2014. He has had previous left above-knee amputation. He currently uses his right leg only for transfer. He currently resides in a skilled nursing facility. He is here today for followup visit. Chronic medical problems include cardiomyopathy with ejection fraction of 20-25%, prior stroke, diabetes, hypertension. These are all currently stable.   10/14/12 arteriogram by Dr. Darrick Penna: Indications: Patient is a 57 year old male who previously had a right popliteal to posterior tibial bypass. He also has a right superficial femoral stent above this. Recent outpatient duplex scan suggested narrowing of the right superficial femoral artery above the level of the stent.  This was slightly technically difficult due to the patient's left hip contracture. The left common femoral artery was identified. This was patent. The infrarenal abdominal aorta is patent. The left and right common iliac arteries are patent. The left and right external iliac arteries are patent. The internal iliac arteries are occluded bilaterally.  Patent right superficial femoral artery stent and right popliteal to posterior tibial artery bypass. 50% stenosis right common femoral artery.  Operative management: The patient is not a candidate for an open operation due to his severe underlying debility. His 50% stenosis of the right common femoral artery although slightly flow-limiting does not put his bypass at high risk currently. He will need continued observation. The patient has a left hip contracture as well as a right hip and right knee contracture which makes him a marginal operative candidate.  He returns today for c/o pain in left  thigh as sharp, shooting, for about 2 months, he has difficulty transferring now, trouble pivoting on right leg. Patient is unable to articulate, sister states since one of his strokes, and sister states pt.does not indicate pain in right thigh at rest. Sister denies that her brother has non-healing wounds. Sister states that her brother has no problems sleeping, no night pain. No records from nursing facility, he seems to be receiving insulin, but sister states his blood sugars are not being checked.  Patient denies New Medical or Surgical History.   Pt Diabetic: Yes, sister states was in good control Pt smoker: former smoker, quit 3 years ago  ASA: Yes Other anticoagulants/antiplatelets: not on past records, no records received from nursing facility  Past Medical History  Diagnosis Date  . Cardiomyopathy     EF 20-25% by echo 01/2010 with severe LVH felt secondary to substance abuse (no ischemic workup)  . 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 Diabetes mellitus   . HTN (hypertension)   . Polysubstance abuse     Reported hx of cocaine, THC, heroin, EtOH abuse  . Noncompliance   . Peripheral vascular disease   . CHF (congestive heart failure)   . Pneumonia   . Dependent for walking   . Wheelchair bound   . Acute renal failure   . Gangrene of foot   . Streptococcal pneumonia     Social History History  Substance Use Topics  . Smoking status: Former Smoker -- 15 years    Types: Cigarettes  . Smokeless tobacco: Never Used     Comment: declined  . Alcohol Use: No  Family History Family History  Problem Relation Age of Onset  . Stroke Brother     Past Surgical History  Procedure Laterality Date  . Total hip arthroplasty    . Amputation  08/15/2011    Procedure: AMPUTATION ABOVE KNEE;  Surgeon: Sherren Kerns, MD;  Location: [facility] OR;  Service: Vascular;  Laterality: Left;  Amputation End:  1610  .  Femoral-tibial bypass graft  12/23/2011    Procedure: BYPASS GRAFT FEMORAL-TIBIAL ARTERY;  Surgeon: Sherren Kerns, MD;  Location: [facility] OR;  Service: Vascular;  Laterality: Right;  Right Popliteal - Tibial Artery Bypass using Reversed Saphenous vein    Allergies  Allergen Reactions  . Almond Oil Shortness Of Breath and Swelling    Current Outpatient Prescriptions  Medication Sig Dispense Refill  . ALPRAZolam (XANAX) 0.5 MG tablet Take one tablet by mouth every 6 hours as needed for 14 days  56 tablet  0  . amLODipine (NORVASC) 10 MG tablet Take 10 mg by mouth daily.      Marland Kitchen aspirin 325 MG tablet Take 325 mg by mouth daily.      . carvedilol (COREG) 25 MG tablet Take 25 mg by mouth daily.       Marland Kitchen ceFAZolin (ANCEF) 1 g injection 1 g by Other route to Surgery.      . folic acid (FOLVITE) 1 MG tablet Take 1 mg by mouth daily.      . insulin aspart (NOVOLOG) 100 UNIT/ML injection Inject 100 Units into the skin 3 (three) times daily before meals.      Marland Kitchen lisinopril (PRINIVIL,ZESTRIL) 10 MG tablet Take 10 mg by mouth daily.       . magnesium hydroxide (MILK OF MAGNESIA) 400 MG/5ML suspension Take by mouth daily as needed for mild constipation.      . nicotine (NICODERM CQ - DOSED IN MG/24 HOURS) 21 mg/24hr patch Place 21 mg onto the skin daily.      Marland Kitchen ofloxacin (FLOXIN) 0.3 % otic solution 5 drops daily.      Marland Kitchen oxycodone (OXY-IR) 5 MG capsule Take 5-10 mg by mouth every 6 (six) hours as needed. 1 tablet (5 mg) as needed for mild pain, 2 tablets (10 mg) every 6 hours as needed for severe pain      . traMADol (ULTRAM) 50 MG tablet Take 50 mg by mouth 2 (two) times daily as needed for pain. For  Mild pain      . citalopram (CELEXA) 20 MG tablet Take 20 mg by mouth daily.       Marland Kitchen docusate sodium (COLACE) 100 MG capsule Take 100 mg by mouth 2 (two) times daily.      . ferrous sulfate 325 (65 FE) MG tablet Take 325 mg by mouth 3 (three) times daily.      . furosemide (LASIX) 20 MG tablet Take 20 mg by  mouth daily.      Marland Kitchen LANTUS SOLOSTAR 100 UNIT/ML injection Inject 5 Units into the skin every evening.        No current facility-administered medications for this visit.    ROS: See HPI for pertinent positives and negatives.   Physical Examination  Filed Vitals:   02/07/13 1543  BP: 105/78  Pulse: 66  Resp: 16   Filed Weights   02/07/13 1543  Weight: 120 lb 6.4 oz (54.613 kg)   Body mass index is 18.85 kg/(m^2).   General: A&O x 3, WDWN,  Gait: in wheelchair Eyes: PERRLA, Pulmonary: CTAB,  without wheezes , rales or rhonchi Cardiac: regular Rythm , without murmur          Carotid Bruits Left Right   Negative Negative   Radial pulses: 1+ right, 2+ left                           VASCULAR EXAM: Extremities without ischemic changes  without Gangrene; without open wounds.                                                                                                          LE Pulses LEFT RIGHT       POPLITEAL  AKA   not palpable       POSTERIOR TIBIAL AKA  not palpable        DORSALIS PEDIS      ANTERIOR TIBIAL AKA   not palpable    Abdomen: soft, NT, no masses. Skin: no rashes, no ulcers noted. Musculoskeletal: no muscle wasting or atrophy.  Neurologic: A&O X 3; Appropriate Affect ; SENSATION: normal; MOTOR FUNCTION:  moving all extremities equally, motor strength 5/5 throughout. Speech is fluent/normal. CN 2-12 intact.    Non-Invasive Vascular Imaging: DATE: 02/07/2013 ABI: RIGHT 0.79, Waveforms: monophasic;  LEFT AKA DUPLEX SCAN OF BYPASS: Elevated velocities suggestive of significant stenosis >50% involving the right CFA/SFA origin. Patent right SFA stent at the mid thigh.  ASSESSMENT: Jeremy Howell is a 57 y.o. male who presents with: stable ABI's since previous study on 08/18/12. He is apparently receiving restorative therapy at the nursing facility several times/week which will help his circulation, contractures, and strength.  PLAN:  I  discussed in depth with the patient the nature of atherosclerosis, and emphasized the importance of maximal medical management including strict control of blood pressure, blood glucose, and lipid levels, obtaining regular exercise, and continued cessation of smoking.  The patient is aware that without maximal medical management the underlying atherosclerotic disease process will progress, limiting the benefit of any interventions. Based on the patient's vascular studies and examination, and after discussing with Dr. Hart Rochester, pt will return to clinic in 6  months for ABI and RLE Duplex. Instructed in daily chair exercises.  The patient was given information about PAD including signs, symptoms, treatment, what symptoms should prompt the patient to seek immediate medical care, and risk reduction measures to take.  Charisse March, RN, MSN, FNP-C [facility] Phone: [phone]  Clinic MD: Hart Rochester  02/07/2013 4:01 PM

## 2013-02-17 ENCOUNTER — Encounter: Payer: Self-pay | Admitting: Internal Medicine

## 2013-02-27 ENCOUNTER — Inpatient Hospital Stay (HOSPITAL_COMMUNITY)
Admission: EM | Admit: 2013-02-27 | Discharge: 2013-03-02 | DRG: 683 | Disposition: A | Discharge status: Skilled Nursing Facility | Payer: PRIVATE HEALTH INSURANCE | Attending: Internal Medicine | Admitting: Internal Medicine

## 2013-02-27 ENCOUNTER — Encounter (HOSPITAL_COMMUNITY): Payer: Self-pay | Admitting: Emergency Medicine

## 2013-02-27 DIAGNOSIS — Z7982 Long term (current) use of aspirin: Secondary | ICD-10-CM

## 2013-02-27 DIAGNOSIS — N179 Acute kidney failure, unspecified: Principal | ICD-10-CM | POA: Diagnosis present

## 2013-02-27 DIAGNOSIS — S78119A Complete traumatic amputation at level between unspecified hip and knee, initial encounter: Secondary | ICD-10-CM

## 2013-02-27 DIAGNOSIS — J9601 Acute respiratory failure with hypoxia: Secondary | ICD-10-CM

## 2013-02-27 DIAGNOSIS — I959 Hypotension, unspecified: Secondary | ICD-10-CM | POA: Diagnosis present

## 2013-02-27 DIAGNOSIS — R339 Retention of urine, unspecified: Secondary | ICD-10-CM | POA: Diagnosis present

## 2013-02-27 DIAGNOSIS — Z96649 Presence of unspecified artificial hip joint: Secondary | ICD-10-CM

## 2013-02-27 DIAGNOSIS — I428 Other cardiomyopathies: Secondary | ICD-10-CM | POA: Diagnosis present

## 2013-02-27 DIAGNOSIS — I69998 Other sequelae following unspecified cerebrovascular disease: Secondary | ICD-10-CM

## 2013-02-27 DIAGNOSIS — I5042 Chronic combined systolic (congestive) and diastolic (congestive) heart failure: Secondary | ICD-10-CM | POA: Diagnosis present

## 2013-02-27 DIAGNOSIS — N183 Chronic kidney disease, stage 3 unspecified: Secondary | ICD-10-CM | POA: Diagnosis present

## 2013-02-27 DIAGNOSIS — I509 Heart failure, unspecified: Secondary | ICD-10-CM | POA: Diagnosis present

## 2013-02-27 DIAGNOSIS — Z8673 Personal history of transient ischemic attack (TIA), and cerebral infarction without residual deficits: Secondary | ICD-10-CM

## 2013-02-27 DIAGNOSIS — N390 Urinary tract infection, site not specified: Secondary | ICD-10-CM | POA: Diagnosis present

## 2013-02-27 DIAGNOSIS — E875 Hyperkalemia: Secondary | ICD-10-CM | POA: Diagnosis present

## 2013-02-27 DIAGNOSIS — Z91199 Patient's noncompliance with other medical treatment and regimen due to unspecified reason: Secondary | ICD-10-CM

## 2013-02-27 DIAGNOSIS — E119 Type 2 diabetes mellitus without complications: Secondary | ICD-10-CM | POA: Diagnosis present

## 2013-02-27 DIAGNOSIS — I161 Hypertensive emergency: Secondary | ICD-10-CM

## 2013-02-27 DIAGNOSIS — R7303 Prediabetes: Secondary | ICD-10-CM | POA: Diagnosis present

## 2013-02-27 DIAGNOSIS — I10 Essential (primary) hypertension: Secondary | ICD-10-CM | POA: Diagnosis present

## 2013-02-27 DIAGNOSIS — E860 Dehydration: Secondary | ICD-10-CM

## 2013-02-27 DIAGNOSIS — Z48812 Encounter for surgical aftercare following surgery on the circulatory system: Secondary | ICD-10-CM

## 2013-02-27 DIAGNOSIS — Z823 Family history of stroke: Secondary | ICD-10-CM

## 2013-02-27 DIAGNOSIS — Z01818 Encounter for other preprocedural examination: Secondary | ICD-10-CM

## 2013-02-27 DIAGNOSIS — Z993 Dependence on wheelchair: Secondary | ICD-10-CM

## 2013-02-27 DIAGNOSIS — E870 Hyperosmolality and hypernatremia: Secondary | ICD-10-CM | POA: Diagnosis present

## 2013-02-27 DIAGNOSIS — I739 Peripheral vascular disease, unspecified: Secondary | ICD-10-CM

## 2013-02-27 DIAGNOSIS — M79609 Pain in unspecified limb: Secondary | ICD-10-CM

## 2013-02-27 DIAGNOSIS — E872 Acidosis, unspecified: Secondary | ICD-10-CM | POA: Diagnosis present

## 2013-02-27 DIAGNOSIS — M6282 Rhabdomyolysis: Secondary | ICD-10-CM

## 2013-02-27 DIAGNOSIS — Z01810 Encounter for preprocedural cardiovascular examination: Secondary | ICD-10-CM

## 2013-02-27 DIAGNOSIS — I96 Gangrene, not elsewhere classified: Secondary | ICD-10-CM

## 2013-02-27 DIAGNOSIS — Z87891 Personal history of nicotine dependence: Secondary | ICD-10-CM

## 2013-02-27 DIAGNOSIS — I1310 Hypertensive heart and chronic kidney disease without heart failure, with stage 1 through stage 4 chronic kidney disease, or unspecified chronic kidney disease: Secondary | ICD-10-CM | POA: Diagnosis present

## 2013-02-27 DIAGNOSIS — I70269 Atherosclerosis of native arteries of extremities with gangrene, unspecified extremity: Secondary | ICD-10-CM

## 2013-02-27 DIAGNOSIS — R4701 Aphasia: Secondary | ICD-10-CM | POA: Diagnosis present

## 2013-02-27 DIAGNOSIS — Z9119 Patient's noncompliance with other medical treatment and regimen: Secondary | ICD-10-CM

## 2013-02-27 DIAGNOSIS — D649 Anemia, unspecified: Secondary | ICD-10-CM

## 2013-02-27 LAB — URINALYSIS, ROUTINE W REFLEX MICROSCOPIC
Glucose, UA: NEGATIVE mg/dL
Hgb urine dipstick: NEGATIVE
Ketones, ur: NEGATIVE mg/dL
Nitrite: NEGATIVE
PROTEIN: NEGATIVE mg/dL
SPECIFIC GRAVITY, URINE: 1.021 (ref 1.005–1.030)
UROBILINOGEN UA: 0.2 mg/dL (ref 0.0–1.0)
pH: 5 (ref 5.0–8.0)

## 2013-02-27 LAB — CBC WITH DIFFERENTIAL/PLATELET
BASOS ABS: 0 10*3/uL (ref 0.0–0.1)
BASOS PCT: 0 % (ref 0–1)
EOS ABS: 0.1 10*3/uL (ref 0.0–0.7)
EOS PCT: 2 % (ref 0–5)
HCT: 40.8 % (ref 39.0–52.0)
Hemoglobin: 14.3 g/dL (ref 13.0–17.0)
Lymphocytes Relative: 26 % (ref 12–46)
Lymphs Abs: 1.5 10*3/uL (ref 0.7–4.0)
MCH: 26.9 pg (ref 26.0–34.0)
MCHC: 35 g/dL (ref 30.0–36.0)
MCV: 76.7 fL — AB (ref 78.0–100.0)
Monocytes Absolute: 0.7 10*3/uL (ref 0.1–1.0)
Monocytes Relative: 11 % (ref 3–12)
Neutro Abs: 3.5 10*3/uL (ref 1.7–7.7)
Neutrophils Relative %: 61 % (ref 43–77)
PLATELETS: 294 10*3/uL (ref 150–400)
RBC: 5.32 MIL/uL (ref 4.22–5.81)
RDW: 16.1 % — AB (ref 11.5–15.5)
WBC: 5.8 10*3/uL (ref 4.0–10.5)

## 2013-02-27 LAB — POCT I-STAT, CHEM 8
BUN: 134 mg/dL — ABNORMAL HIGH (ref 6–23)
Calcium, Ion: 1.29 mmol/L — ABNORMAL HIGH (ref 1.12–1.23)
Chloride: 117 mEq/L — ABNORMAL HIGH (ref 96–112)
Creatinine, Ser: 3.3 mg/dL — ABNORMAL HIGH (ref 0.50–1.35)
GLUCOSE: 112 mg/dL — AB (ref 70–99)
HEMATOCRIT: 48 % (ref 39.0–52.0)
HEMOGLOBIN: 16.3 g/dL (ref 13.0–17.0)
POTASSIUM: 6.8 meq/L — AB (ref 3.7–5.3)
SODIUM: 141 meq/L (ref 137–147)
TCO2: 16 mmol/L (ref 0–100)

## 2013-02-27 LAB — BASIC METABOLIC PANEL
BUN: 123 mg/dL — ABNORMAL HIGH (ref 6–23)
CALCIUM: 9.6 mg/dL (ref 8.4–10.5)
CO2: 14 mEq/L — ABNORMAL LOW (ref 19–32)
Chloride: 109 mEq/L (ref 96–112)
Creatinine, Ser: 2.96 mg/dL — ABNORMAL HIGH (ref 0.50–1.35)
GFR, EST AFRICAN AMERICAN: 25 mL/min — AB (ref >90)
GFR, EST NON AFRICAN AMERICAN: 22 mL/min — AB (ref >90)
GLUCOSE: 92 mg/dL (ref 70–99)
Potassium: 6.6 mEq/L (ref 3.7–5.3)
SODIUM: 139 meq/L (ref 137–147)

## 2013-02-27 LAB — URINE MICROSCOPIC-ADD ON

## 2013-02-27 MED ORDER — FERROUS SULFATE 325 (65 FE) MG PO TABS
325.0000 mg | ORAL_TABLET | Freq: Three times a day (TID) | ORAL | Status: DC
  Administered 2013-02-28 – 2013-03-02 (×8): 325 mg via ORAL
  Filled 2013-02-27 (×11): qty 1

## 2013-02-27 MED ORDER — INSULIN ASPART 100 UNIT/ML ~~LOC~~ SOLN
10.0000 [IU] | Freq: Once | SUBCUTANEOUS | Status: AC
  Administered 2013-02-27: 10 [IU] via INTRAVENOUS

## 2013-02-27 MED ORDER — ALBUTEROL SULFATE (2.5 MG/3ML) 0.083% IN NEBU
10.0000 mg | INHALATION_SOLUTION | Freq: Once | RESPIRATORY_TRACT | Status: AC
  Administered 2013-02-27: 10 mg via RESPIRATORY_TRACT
  Filled 2013-02-27: qty 12

## 2013-02-27 MED ORDER — SODIUM CHLORIDE 0.9 % IV SOLN
INTRAVENOUS | Status: DC
  Administered 2013-02-28: 01:00:00 via INTRAVENOUS

## 2013-02-27 MED ORDER — ASPIRIN 325 MG PO TABS
325.0000 mg | ORAL_TABLET | Freq: Every day | ORAL | Status: DC
  Administered 2013-02-28 – 2013-03-02 (×3): 325 mg via ORAL
  Filled 2013-02-27 (×3): qty 1

## 2013-02-27 MED ORDER — INSULIN GLARGINE 100 UNIT/ML SOLOSTAR PEN: 5.0000 [IU] | PEN_INJECTOR | Freq: Every evening | SUBCUTANEOUS | Status: DC

## 2013-02-27 MED ORDER — SODIUM CHLORIDE 0.9 % IV BOLUS (SEPSIS)
2000.0000 mL | Freq: Once | INTRAVENOUS | Status: AC
  Administered 2013-02-27: 2000 mL via INTRAVENOUS

## 2013-02-27 MED ORDER — SODIUM BICARBONATE 8.4 % IV SOLN
50.0000 meq | Freq: Once | INTRAVENOUS | Status: AC
  Administered 2013-02-27: 50 meq via INTRAVENOUS
  Filled 2013-02-27: qty 50

## 2013-02-27 MED ORDER — SODIUM CHLORIDE 0.9 % IV SOLN: INTRAVENOUS | Status: DC

## 2013-02-27 MED ORDER — INSULIN GLARGINE 100 UNIT/ML ~~LOC~~ SOLN
5.0000 [IU] | Freq: Every day | SUBCUTANEOUS | Status: DC
  Administered 2013-02-28: 5 [IU] via SUBCUTANEOUS
  Filled 2013-02-27 (×2): qty 0.05

## 2013-02-27 MED ORDER — SIMVASTATIN 10 MG PO TABS
10.0000 mg | ORAL_TABLET | Freq: Every day | ORAL | Status: DC
  Administered 2013-02-28 – 2013-03-01 (×2): 10 mg via ORAL
  Filled 2013-02-27 (×3): qty 1

## 2013-02-27 MED ORDER — CITALOPRAM HYDROBROMIDE 20 MG PO TABS
20.0000 mg | ORAL_TABLET | Freq: Every day | ORAL | Status: DC
  Administered 2013-02-28 – 2013-03-02 (×3): 20 mg via ORAL
  Filled 2013-02-27 (×3): qty 1

## 2013-02-27 MED ORDER — FOLIC ACID 1 MG PO TABS
1.0000 mg | ORAL_TABLET | Freq: Every day | ORAL | Status: DC
  Administered 2013-02-28 – 2013-03-02 (×3): 1 mg via ORAL
  Filled 2013-02-27 (×3): qty 1

## 2013-02-27 MED ORDER — DOCUSATE SODIUM 100 MG PO CAPS
100.0000 mg | ORAL_CAPSULE | Freq: Two times a day (BID) | ORAL | Status: DC
  Administered 2013-02-28 – 2013-03-01 (×3): 100 mg via ORAL
  Filled 2013-02-27 (×5): qty 1

## 2013-02-27 MED ORDER — INSULIN ASPART 100 UNIT/ML IV SOLN: 10.0000 [IU] | Freq: Once | INTRAVENOUS | Status: DC

## 2013-02-27 MED ORDER — SODIUM CHLORIDE 0.9 % IJ SOLN
3.0000 mL | Freq: Two times a day (BID) | INTRAMUSCULAR | Status: DC
  Administered 2013-03-01 (×2): 3 mL via INTRAVENOUS

## 2013-02-27 MED ORDER — HEPARIN SODIUM (PORCINE) 5000 UNIT/ML IJ SOLN
5000.0000 [IU] | Freq: Three times a day (TID) | INTRAMUSCULAR | Status: DC
  Administered 2013-02-28 – 2013-03-02 (×9): 5000 [IU] via SUBCUTANEOUS
  Filled 2013-02-27 (×11): qty 1

## 2013-02-27 MED ORDER — DEXTROSE 50 % IV SOLN
50.0000 mL | Freq: Once | INTRAVENOUS | Status: AC
  Administered 2013-02-27: 50 mL via INTRAVENOUS
  Filled 2013-02-27: qty 50

## 2013-02-27 NOTE — H&P (Signed)
[facility] History and Physical  Doctor Cobas [id number] DOB: 1955/06/25 DOA: 02/27/2013  Referring physician: EDP PCP: Hennie Duos, MD   Chief Complaint: Urinary retention, dehydration   HPI: Jeremy Howell is a 58 y.o. male who presents to the ED with c/o urinary retention.  He indicates that his urinary retention has been going on for "a while", and so that he didn't have to go to the bathroom as much, he tried to drink less fluids (this didn't work too well as discussed in a bit).  RN tried to insert foley at his SNF (he is at an SNF following h/o CVA, and amputation of lower extremity).    Review of Systems: Patient has expressive aphasia, but indicates clearly that he is thirsty and wants water.  Systems reviewed.  As above, otherwise negative  Past Medical History  Diagnosis Date  . Cardiomyopathy     EF 20-25% by echo 01/2010 with severe LVH felt secondary to substance abuse (no ischemic workup)  . 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 Diabetes mellitus   . HTN (hypertension)   . Polysubstance abuse     Reported hx of cocaine, THC, heroin, EtOH abuse  . Noncompliance   . Peripheral vascular disease   . CHF (congestive heart failure)   . Pneumonia   . Dependent for walking   . Wheelchair bound   . Acute renal failure   . Gangrene of foot   . Streptococcal pneumonia    Past Surgical History  Procedure Laterality Date  . Total hip arthroplasty    . Amputation  08/15/2011    Procedure: AMPUTATION ABOVE KNEE;  Surgeon: Elam Dutch, MD;  Location: [facility];  Service: Vascular;  Laterality: Left;  Amputation End:  [id number]  . Femoral-tibial bypass graft  12/23/2011    Procedure: BYPASS GRAFT FEMORAL-TIBIAL ARTERY;  Surgeon: Elam Dutch, MD;  Location: [facility] OR;  Service: Vascular;  Laterality: Right;  Right Popliteal - Tibial Artery Bypass using Reversed Saphenous vein   Social History:   reports that he has quit smoking. His smoking use included Cigarettes. He smoked 0.00 packs per day for 15 years. He has never used smokeless tobacco. He reports that he does not drink alcohol or use illicit drugs.  Allergies  Allergen Reactions  . Almond Oil Shortness Of Breath and Swelling    Family History  Problem Relation Age of Onset  . Stroke Brother      Prior to Admission medications   Medication Sig Start Date End Date Taking? Authorizing Provider  aspirin 325 MG tablet Take 325 mg by mouth daily.   Yes Historical Provider, MD  carvedilol (COREG) 25 MG tablet Take 25 mg by mouth daily.    Yes Historical Provider, MD  citalopram (CELEXA) 20 MG tablet Take 20 mg by mouth daily.  05/09/12  Yes Historical Provider, MD  docusate sodium (COLACE) 100 MG capsule Take 100 mg by mouth 2 (two) times daily.   Yes Historical Provider, MD  ferrous sulfate 325 (65 FE) MG tablet Take 325 mg by mouth 3 (three) times daily with meals.    Yes Historical Provider, MD  folic acid (FOLVITE) 1 MG tablet Take 1 mg by mouth daily.   Yes Historical Provider, MD  furosemide (LASIX) 20 MG tablet Take 20 mg by mouth daily.   Yes Historical Provider, MD  LANTUS SOLOSTAR 100 UNIT/ML injection Inject  5 Units into the skin every evening.  04/30/12  Yes Historical Provider, MD  lisinopril (PRINIVIL,ZESTRIL) 10 MG tablet Take 10 mg by mouth daily.    Yes Historical Provider, MD  magnesium hydroxide (MILK OF MAGNESIA) 400 MG/5ML suspension Take by mouth daily as needed for mild constipation.   Yes Historical Provider, MD  Olopatadine HCl 0.2 % SOLN Place 1 drop into both eyes every evening.   Yes Historical Provider, MD  pravastatin (PRAVACHOL) 20 MG tablet Take 20 mg by mouth daily.   Yes Historical Provider, MD  Vitamin D, Ergocalciferol, (DRISDOL) 50000 UNITS CAPS capsule Take 50,000 Units by mouth every 30 (thirty) days. 14th of the month   Yes Historical Provider, MD   Physical Exam: Filed Vitals:   02/27/13  2209  BP: 106/55  Pulse: 97  Temp:   Resp: 15    BP 106/55  Pulse 97  Temp(Src) 97.8 F (36.6 C) (Oral)  Resp 15  SpO2 97%  General Appearance:    Alert, oriented, no distress, appears stated age  Head:    Normocephalic, atraumatic  Eyes:    PERRL, EOMI, sclera non-icteric        Nose:   Nares without drainage or epistaxis. Mucosa, turbinates normal  Throat:   Dry mucous membranes. Oropharynx without erythema or exudate.  Neck:   Supple. No carotid bruits.  No thyromegaly.  No lymphadenopathy.   Back:     No CVA tenderness, no spinal tenderness  Lungs:     Clear to auscultation bilaterally, without wheezes, rhonchi or rales  Chest wall:    No tenderness to palpitation  Heart:    Regular rate and rhythm without murmurs, gallops, rubs  Abdomen:     Soft, non-tender, nondistended, normal bowel sounds, no organomegaly  Genitalia:    deferred  Rectal:    deferred  Extremities:   No clubbing, cyanosis or edema.  Pulses:   2+ and symmetric all extremities  Skin:   Skin color, texture, turgor normal, no rashes or lesions  Lymph nodes:   Cervical, supraclavicular, and axillary nodes normal  Neurologic:   Has baseline expressive aphasia, R sided hemiparesis, LLE amputation.    Labs on Admission:  Basic Metabolic Panel:  Recent Labs Lab 02/27/13 1916 02/27/13 1919  NA 141 139  K 6.8* 6.6*  CL 117* 109  CO2  --  14*  GLUCOSE 112* 92  BUN 134* 123*  CREATININE 3.30* 2.96*  CALCIUM  --  9.6   Liver Function Tests: No results found for this basename: AST, ALT, ALKPHOS, BILITOT, PROT, ALBUMIN,  in the last 168 hours No results found for this basename: LIPASE, AMYLASE,  in the last 168 hours No results found for this basename: AMMONIA,  in the last 168 hours CBC:  Recent Labs Lab 02/27/13 1916 02/27/13 1919  WBC  --  5.8  NEUTROABS  --  3.5  HGB 16.3 14.3  HCT 48.0 40.8  MCV  --  76.7*  PLT  --  294   Cardiac Enzymes: No results found for this basename: CKTOTAL,  CKMB, CKMBINDEX, TROPONINI,  in the last 168 hours  BNP (last 3 results) No results found for this basename: PROBNP,  in the last 8760 hours CBG: No results found for this basename: GLUCAP,  in the last 168 hours  Radiological Exams on Admission: No results found.  EKG: Independently reviewed.  Assessment/Plan Principal Problem:   AKI (acute kidney injury) Active Problems:   Diabetes mellitus  HTN (hypertension)   Dehydration   Urinary retention   Hyperkalemia   1. AKI - likely pre-renal with Dehydration due to decreased PO intake in what appears to be a self attempt to treat urinary retention that has been going on for "a while".  Rehydrating with IVF, bolus in ED then 125 cc/hr.  Additionally patient states he would like to drink water, is very thirsty and appears dry on exam.  Given hypotension holding all BP meds including nephrotoxic ACEi and lasix. 2. Hyperkalemia - temporary measures in ED, potassium checks Q4H, tele monitor, on hyperkalemia pathway. 3. Urinary retention - appears to have resolved spontaneously without foley placement in ED, bladder scan PRN, put catheter in if needed, checking PSA, but may need further imaging of his prostate to determine if this is due to BPH vs malignancy vs other. 4. DM - continue lantus 5 daily, CBG checks AC/HS.    Code Status: Full  Family Communication: None Disposition Plan: Admit to inpatient   Time spent: 70 min  Dezman Granda M. [facility] Pager [phone]  If 7AM-7PM, please contact the day team taking care of the patient [email or web address] Password [facility] 02/27/2013, 10:56 PM

## 2013-02-27 NOTE — ED Provider Notes (Signed)
CSN: [id number]     Arrival date & time 02/27/13  1615 History   First MD Initiated Contact with Patient 02/27/13 1818     Chief Complaint  Patient presents with  . Urinary Retention  . Hypotension   (Consider location/radiation/quality/duration/timing/severity/associated sxs/prior Treatment) HPI 58 year old diabetic prior stroke wheelchair-bound due to right-sided weakness with left above-the-knee amputation at baseline has several days of decreased ability to start his urination, once he starts he feels as if he is voiding completely, with no fever no vomiting no chest pain no shortness of breath no abdominal pain no diarrhea no change in baseline mental status; at baseline has expressive aphasia; was unable to urinate at nursing home PTA so nursing home tried to place Foley catheter today which was unsuccessful, however upon arrival to the emergency department the patient had bladder scan was 170 ML's urinary bladder and was able to spontaneously void over 100 mL's in the emergency department just prior to my entry to the room. The patient has no dysuria. Past Medical History  Diagnosis Date  . Cardiomyopathy     EF 20-25% by echo 01/2010 with severe LVH felt secondary to substance abuse (no ischemic workup)  . 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 Diabetes mellitus   . HTN (hypertension)   . Polysubstance abuse     Reported hx of cocaine, THC, heroin, EtOH abuse  . Noncompliance   . Peripheral vascular disease   . CHF (congestive heart failure)   . Pneumonia   . Dependent for walking   . Wheelchair bound   . Acute renal failure   . Gangrene of foot   . Streptococcal pneumonia    Past Surgical History  Procedure Laterality Date  . Total hip arthroplasty    . Amputation  08/15/2011    Procedure: AMPUTATION ABOVE KNEE;  Surgeon: Elam Dutch, MD;  Location: [facility];  Service: Vascular;  Laterality: Left;   Amputation End:  [id number]  . Femoral-tibial bypass graft  12/23/2011    Procedure: BYPASS GRAFT FEMORAL-TIBIAL ARTERY;  Surgeon: Elam Dutch, MD;  Location: [facility] OR;  Service: Vascular;  Laterality: Right;  Right Popliteal - Tibial Artery Bypass using Reversed Saphenous vein   Family History  Problem Relation Age of Onset  . Stroke Brother    History  Substance Use Topics  . Smoking status: Former Smoker -- 15 years    Types: Cigarettes  . Smokeless tobacco: Never Used     Comment: declined  . Alcohol Use: No    Review of Systems 10 Systems reviewed and are negative for acute change except as noted in the HPI. Allergies  Almond oil  Home Medications   Current Outpatient Rx  Name  Route  Sig  Dispense  Refill  . aspirin 325 MG tablet   Oral   Take 325 mg by mouth daily.         . carvedilol (COREG) 25 MG tablet   Oral   Take 25 mg by mouth daily.          . citalopram (CELEXA) 20 MG tablet   Oral   Take 20 mg by mouth daily.          Marland Kitchen docusate sodium (COLACE) 100 MG capsule   Oral   Take 100 mg by mouth 2 (two) times daily.         . ferrous sulfate 325 (  65 FE) MG tablet   Oral   Take 325 mg by mouth 3 (three) times daily with meals.          . folic acid (FOLVITE) 1 MG tablet   Oral   Take 1 mg by mouth daily.         . furosemide (LASIX) 20 MG tablet   Oral   Take 20 mg by mouth daily.         Marland Kitchen LANTUS SOLOSTAR 100 UNIT/ML injection   Subcutaneous   Inject 5 Units into the skin every evening.          . magnesium hydroxide (MILK OF MAGNESIA) 400 MG/5ML suspension   Oral   Take by mouth daily as needed for mild constipation.         . Olopatadine HCl 0.2 % SOLN   Both Eyes   Place 1 drop into both eyes every evening.         . pravastatin (PRAVACHOL) 20 MG tablet   Oral   Take 20 mg by mouth daily.         . Vitamin D, Ergocalciferol, (DRISDOL) 50000 UNITS CAPS capsule   Oral   Take 50,000 Units by mouth every 30 (thirty)  days. 14th of the month         . ciprofloxacin (CIPRO) 500 MG tablet   Oral   Take 1 tablet (500 mg total) by mouth 2 (two) times daily.   4 tablet   0   . tamsulosin (FLOMAX) 0.4 MG CAPS capsule   Oral   Take 1 capsule (0.4 mg total) by mouth daily after breakfast.   30 capsule   0    BP 153/85  Pulse 103  Temp(Src) 97.8 F (36.6 C) (Oral)  Resp 20  Ht 5\' 7"  (1.702 m)  Wt 138 lb 0.1 oz (62.6 kg)  BMI 21.61 kg/m2  SpO2 97% Physical Exam  Nursing note and vitals reviewed. Constitutional:  Awake, alert, nontoxic appearance.  HENT:  Head: Atraumatic.  Eyes: Right eye exhibits no discharge. Left eye exhibits no discharge.  Neck: Neck supple.  Cardiovascular: Normal rate and regular rhythm.   No murmur heard. Pulmonary/Chest: Effort normal and breath sounds normal. No respiratory distress. He has no wheezes. He has no rales. He exhibits no tenderness.  Abdominal: Soft. Bowel sounds are normal. He exhibits no distension and no mass. There is no tenderness. There is no rebound and no guarding.  Musculoskeletal: He exhibits no tenderness.  Baseline ROM, no obvious new focal weakness. Baseline left above-the-knee amputation.  Neurological: He is alert.  Mental status and motor strength appears baseline for patient and situation. Baseline expressive aphasia baseline right-sided hemiparesis.  Skin: No rash noted.  Psychiatric: He has a normal mood and affect.    ED Course  Procedures (including critical care time) Patient understand and agree with initial ED impression and plan with expectations set for ED visit. D/w Med for admit. Patient informed of clinical course, understand medical decision-making process, and agree with plan.  CRITICAL CARE Performed by: Babette Relic Total critical care time: 8min including treatment of hyperkalemia. Critical care time was exclusive of separately billable procedures and treating other patients. Critical care was necessary to  treat or prevent imminent or life-threatening deterioration. Critical care was time spent personally by me on the following activities: development of treatment plan with patient and/or surrogate as well as nursing, discussions with consultants, evaluation of patient's response to treatment, examination of patient,  obtaining history from patient or surrogate, ordering and performing treatments and interventions, ordering and review of laboratory studies, ordering and review of radiographic studies, pulse oximetry and re-evaluation of patient's condition. Labs Review Labs Reviewed  URINALYSIS, ROUTINE W REFLEX MICROSCOPIC - Abnormal; Notable for the following:    APPearance HAZY (*)    Bilirubin Urine SMALL (*)    Leukocytes, UA MODERATE (*)    All other components within normal limits  URINE MICROSCOPIC-ADD ON - Abnormal; Notable for the following:    Casts HYALINE CASTS (*)    All other components within normal limits  CBC WITH DIFFERENTIAL - Abnormal; Notable for the following:    MCV 76.7 (*)    RDW 16.1 (*)    All other components within normal limits  BASIC METABOLIC PANEL - Abnormal; Notable for the following:    Potassium 6.6 (*)    CO2 14 (*)    BUN 123 (*)    Creatinine, Ser 2.96 (*)    GFR calc non Af Amer 22 (*)    GFR calc Af Amer 25 (*)    All other components within normal limits  POTASSIUM - Abnormal; Notable for the following:    Potassium 6.5 (*)    All other components within normal limits  CBC - Abnormal; Notable for the following:    Hemoglobin 12.3 (*)    HCT 36.3 (*)    MCV 77.2 (*)    RDW 16.2 (*)    All other components within normal limits  BASIC METABOLIC PANEL - Abnormal; Notable for the following:    Potassium 5.8 (*)    Chloride 115 (*)    CO2 15 (*)    BUN 104 (*)    Creatinine, Ser 2.19 (*)    GFR calc non Af Amer 31 (*)    GFR calc Af Amer 36 (*)    All other components within normal limits  BASIC METABOLIC PANEL - Abnormal; Notable for the  following:    Glucose, Bld 111 (*)    BUN 74 (*)    Creatinine, Ser 1.46 (*)    GFR calc non Af Amer 51 (*)    GFR calc Af Amer 59 (*)    All other components within normal limits  GLUCOSE, CAPILLARY - Abnormal; Notable for the following:    Glucose-Capillary 111 (*)    All other components within normal limits  BASIC METABOLIC PANEL - Abnormal; Notable for the following:    Glucose, Bld 116 (*)    BUN 36 (*)    Calcium 8.3 (*)    GFR calc non Af Amer 73 (*)    GFR calc Af Amer 85 (*)    All other components within normal limits  GLUCOSE, CAPILLARY - Abnormal; Notable for the following:    Glucose-Capillary 123 (*)    All other components within normal limits  GLUCOSE, CAPILLARY - Abnormal; Notable for the following:    Glucose-Capillary 145 (*)    All other components within normal limits  GLUCOSE, CAPILLARY - Abnormal; Notable for the following:    Glucose-Capillary 113 (*)    All other components within normal limits  GLUCOSE, CAPILLARY - Abnormal; Notable for the following:    Glucose-Capillary 110 (*)    All other components within normal limits  BASIC METABOLIC PANEL - Abnormal; Notable for the following:    Glucose, Bld 105 (*)    Calcium 8.3 (*)    GFR calc non Af Amer 76 (*)  GFR calc Af Amer 89 (*)    All other components within normal limits  CBC - Abnormal; Notable for the following:    Hemoglobin 10.9 (*)    HCT 33.7 (*)    MCH 25.5 (*)    All other components within normal limits  GLUCOSE, CAPILLARY - Abnormal; Notable for the following:    Glucose-Capillary 176 (*)    All other components within normal limits  GLUCOSE, CAPILLARY - Abnormal; Notable for the following:    Glucose-Capillary 104 (*)    All other components within normal limits  GLUCOSE, CAPILLARY - Abnormal; Notable for the following:    Glucose-Capillary 100 (*)    All other components within normal limits  POCT I-STAT, CHEM 8 - Abnormal; Notable for the following:    Potassium 6.8 (*)     Chloride 117 (*)    BUN 134 (*)    Creatinine, Ser 3.30 (*)    Glucose, Bld 112 (*)    Calcium, Ion 1.29 (*)    All other components within normal limits  MRSA PCR SCREENING  PSA  POTASSIUM  GLUCOSE, CAPILLARY  CK  GLUCOSE, CAPILLARY   Imaging Review No results found.  EKG Interpretation    Date/Time:  Monday February 27 2013 22:08:44 EST Ventricular Rate:  93 PR Interval:  166 QRS Duration: 103 QT Interval:  345 QTC Calculation: 429 R Axis:   57 Text Interpretation:  Sinus rhythm No significant change since last tracing Confirmed by [facility]  MD, Anthone Prieur ([id number]) on 02/27/2013 10:24:01 PM            MDM   1. Hyperkalemia   2. Acute renal failure   3. Dehydration   4. Diabetes mellitus   5. HTN (hypertension)   6. Acute respiratory failure with hypoxia   7. AKI (acute kidney injury)   8. Anemia   9. Aphasia   10. Chronic combined systolic and diastolic CHF (congestive heart failure)   11. Urinary retention   12. Hypertensive emergency   13. Chronic combined mod- severe systolic (EF 65-99%) and grade 1 diastolic CHF (congestive heart failure)   14. Gangrene of foot   15. PVD (peripheral vascular disease)   16. Metabolic acidosis   17. History of CVA (cerebrovascular accident)   6. Rhabdomyolysis   19. Atherosclerosis of native arteries of the extremities with gangrene   20. Aftercare following surgery of the circulatory system, NEC   21. Preop cardiovascular exam   22. Peripheral vascular disease, unspecified   23. Preoperative examination, unspecified   24. Pain in limb-Right Thigh    The patient appears reasonably stabilized for admission considering the current resources, flow, and capabilities available in the ED at this time, and I doubt any other [facility] requiring further screening and/or treatment in the ED prior to admission.    Babette Relic, MD 03/02/13 2105

## 2013-02-27 NOTE — ED Notes (Signed)
Pt took off Neb Mask and refused to finish treatment.

## 2013-02-27 NOTE — ED Notes (Signed)
6.6 Critical lab value, notified by lab. Dr. Stevie Kern notified.

## 2013-02-27 NOTE — ED Notes (Signed)
Respiratory notified of breathing treatment.

## 2013-02-27 NOTE — ED Notes (Signed)
Per EMS: Pt from [facility], pt has urinary retention "for awhile". Nurse at facility reports attempting to insert catheter but states "couldn't get it to pass." Facility reported BP 86/50 this AM. BP 118/70 with EMS. CBG 277. HR 76. Pt AO at baseline, denies pain. Hx: DM, left AKA, right side deficit and contracture.

## 2013-02-28 ENCOUNTER — Inpatient Hospital Stay (HOSPITAL_COMMUNITY): Payer: PRIVATE HEALTH INSURANCE

## 2013-02-28 LAB — PSA: PSA: 1.06 ng/mL (ref <=4.00)

## 2013-02-28 LAB — BASIC METABOLIC PANEL
BUN: 104 mg/dL — ABNORMAL HIGH (ref 6–23)
BUN: 74 mg/dL — AB (ref 6–23)
CHLORIDE: 110 meq/L (ref 96–112)
CO2: 15 mEq/L — ABNORMAL LOW (ref 19–32)
CO2: 20 meq/L (ref 19–32)
CREATININE: 1.46 mg/dL — AB (ref 0.50–1.35)
Calcium: 8.8 mg/dL (ref 8.4–10.5)
Calcium: 8.8 mg/dL (ref 8.4–10.5)
Chloride: 115 mEq/L — ABNORMAL HIGH (ref 96–112)
Creatinine, Ser: 2.19 mg/dL — ABNORMAL HIGH (ref 0.50–1.35)
GFR calc Af Amer: 59 mL/min — ABNORMAL LOW (ref >90)
GFR calc non Af Amer: 51 mL/min — ABNORMAL LOW (ref >90)
GFR, EST AFRICAN AMERICAN: 36 mL/min — AB (ref >90)
GFR, EST NON AFRICAN AMERICAN: 31 mL/min — AB (ref >90)
Glucose, Bld: 84 mg/dL (ref 70–99)
Glucose, Bld: 111 mg/dL — ABNORMAL HIGH (ref 70–99)
POTASSIUM: 5.8 meq/L — AB (ref 3.7–5.3)
POTASSIUM: 5.1 meq/L (ref 3.7–5.3)
Sodium: 144 mEq/L (ref 137–147)
Sodium: 144 mEq/L (ref 137–147)

## 2013-02-28 LAB — CBC
HCT: 36.3 % — ABNORMAL LOW (ref 39.0–52.0)
Hemoglobin: 12.3 g/dL — ABNORMAL LOW (ref 13.0–17.0)
MCH: 26.2 pg (ref 26.0–34.0)
MCHC: 33.9 g/dL (ref 30.0–36.0)
MCV: 77.2 fL — ABNORMAL LOW (ref 78.0–100.0)
Platelets: 268 10*3/uL (ref 150–400)
RBC: 4.7 MIL/uL (ref 4.22–5.81)
RDW: 16.2 % — AB (ref 11.5–15.5)
WBC: 4.8 10*3/uL (ref 4.0–10.5)

## 2013-02-28 LAB — GLUCOSE, CAPILLARY
GLUCOSE-CAPILLARY: 76 mg/dL (ref 70–99)
Glucose-Capillary: 111 mg/dL — ABNORMAL HIGH (ref 70–99)
Glucose-Capillary: 123 mg/dL — ABNORMAL HIGH (ref 70–99)
Glucose-Capillary: 145 mg/dL — ABNORMAL HIGH (ref 70–99)

## 2013-02-28 LAB — POTASSIUM
POTASSIUM: 6.5 meq/L — AB (ref 3.7–5.3)
Potassium: 5.1 mEq/L (ref 3.7–5.3)

## 2013-02-28 LAB — MRSA PCR SCREENING: MRSA by PCR: NEGATIVE

## 2013-02-28 LAB — CK: CK TOTAL: 98 U/L (ref 7–232)

## 2013-02-28 MED ORDER — MUSCLE RUB 10-15 % EX CREA
TOPICAL_CREAM | CUTANEOUS | Status: DC | PRN
  Administered 2013-03-01: 1 via TOPICAL
  Administered 2013-03-01: 11:00:00 via TOPICAL
  Filled 2013-02-28: qty 85

## 2013-02-28 MED ORDER — MORPHINE SULFATE 2 MG/ML IJ SOLN
2.0000 mg | INTRAMUSCULAR | Status: DC | PRN
  Administered 2013-02-28 – 2013-03-01 (×2): 2 mg via INTRAVENOUS
  Filled 2013-02-28 (×3): qty 1

## 2013-02-28 MED ORDER — CEFTRIAXONE SODIUM 1 G IJ SOLR
1.0000 g | INTRAMUSCULAR | Status: DC
  Administered 2013-02-28 – 2013-03-02 (×3): 1 g via INTRAVENOUS
  Filled 2013-02-28 (×4): qty 10

## 2013-02-28 MED ORDER — INSULIN ASPART 100 UNIT/ML ~~LOC~~ SOLN
8.0000 [IU] | Freq: Once | SUBCUTANEOUS | Status: AC
  Administered 2013-02-28: 8 [IU] via INTRAVENOUS

## 2013-02-28 MED ORDER — DEXTROSE 50 % IV SOLN
50.0000 mL | Freq: Once | INTRAVENOUS | Status: AC
  Administered 2013-02-28: 50 mL via INTRAVENOUS
  Filled 2013-02-28: qty 50

## 2013-02-28 MED ORDER — BOOST / RESOURCE BREEZE PO LIQD
1.0000 | Freq: Two times a day (BID) | ORAL | Status: DC
  Administered 2013-02-28 – 2013-03-02 (×4): 1 via ORAL
  Filled 2013-02-28: qty 1

## 2013-02-28 MED ORDER — BIOTENE DRY MOUTH MT LIQD
15.0000 mL | Freq: Two times a day (BID) | OROMUCOSAL | Status: DC
  Administered 2013-02-28 – 2013-03-02 (×6): 15 mL via OROMUCOSAL

## 2013-02-28 MED ORDER — TAMSULOSIN HCL 0.4 MG PO CAPS
0.4000 mg | ORAL_CAPSULE | Freq: Every day | ORAL | Status: DC
  Administered 2013-02-28 – 2013-03-02 (×3): 0.4 mg via ORAL
  Filled 2013-02-28 (×5): qty 1

## 2013-02-28 MED ORDER — SODIUM POLYSTYRENE SULFONATE 15 GM/60ML PO SUSP
30.0000 g | Freq: Once | ORAL | Status: AC
  Administered 2013-02-28: 30 g via ORAL
  Filled 2013-02-28: qty 120

## 2013-02-28 MED ORDER — SODIUM BICARBONATE 8.4 % IV SOLN
INTRAVENOUS | Status: DC
  Administered 2013-02-28: 10:00:00 via INTRAVENOUS
  Filled 2013-02-28 (×4): qty 1000

## 2013-02-28 MED ORDER — SODIUM BICARBONATE 8.4 % IV SOLN
INTRAVENOUS | Status: DC
  Administered 2013-02-28 – 2013-03-01 (×3): via INTRAVENOUS
  Filled 2013-02-28 (×5): qty 150

## 2013-02-28 NOTE — Progress Notes (Signed)
Patient arrived from ED via stretcher. VSS. Patient oriented to room and call bell system. Tele box placed and CCMD notified. Pt is resting comfortably.

## 2013-02-28 NOTE — Progress Notes (Signed)
UR completed. Wilhelmena Zea RN CCM Case Mgmt 

## 2013-02-28 NOTE — Progress Notes (Addendum)
INITIAL NUTRITION ASSESSMENT  DOCUMENTATION CODES Per approved criteria  -Not Applicable   INTERVENTION: Add Resource Breeze po BID, each supplement provides 250 kcal and 9 grams of protein. RD to continue to follow nutrition care plan.  NUTRITION DIAGNOSIS: Inadequate oral intake related to food choices as evidenced by pt report.   Goal: Intake to meet >90% of estimated nutrition needs.  Monitor:  weight trends, lab trends, I/O's, PO intake, supplement tolerance  Reason for Assessment: MD Consult  58 y.o. male  Admitting Dx: AKI (acute kidney injury)  ASSESSMENT: PMHx significant for cardiomyopathy, CVA, DM, hx of polysubstance abuse, PVD, CHF, AKA. Admitted with urinary retention. Work-up reveals dehydration.  Pt made self-attempt to treat urinary retention by drinking less and is now admitted with dehydration. Pt also with hyperkalemia. Only ate 20% of his breakfast this morning. Pt only ate his peaches for his lunch. Per family at bedside, pt was not given foods that he likes to eat. RD has called down to give him a [place] sandwich for lunch and have arranged breakfast for tomorrow morning.  Pt with severe temporal wasting. Difficult to assess the rest of body 2/2 R sided hemiparesis and LLE amputation. Per chart review, pt's weight has been relatively stable.  Potassium is currently elevated at 6.5 and trending up.  Pt is at nutrition risk 2/2 poor oral intake and acute on chronic medical issues.  Height: Ht Readings from Last 1 Encounters:  02/27/13 5\' 7"  (1.702 m)    Weight: Wt Readings from Last 1 Encounters:  02/27/13 131 lb 2.8 oz (59.5 kg)    Ideal Body Weight: 136 lb (adjusted for AKA)  % Ideal Body Weight: 96%  Wt Readings from Last 10 Encounters:  02/27/13 131 lb 2.8 oz (59.5 kg)  02/07/13 120 lb 6.4 oz (54.613 kg)  01/03/13 142 lb (64.411 kg)  11/07/12 140 lb 12.8 oz (63.866 kg)  10/14/12 140 lb (63.504 kg)  10/14/12 140 lb (63.504 kg)  10/06/12  132 lb (59.875 kg)  09/29/12 132 lb (59.875 kg)  06/13/12 126 lb 9.6 oz (57.425 kg)  05/12/12 120 lb 6.4 oz (54.613 kg)    Usual Body Weight: 130 - 140 lb  % Usual Body Weight: 100%  BMI: 22.2 (adjusted for AKA)  Normal weight  Estimated Nutritional Needs: Kcal: 1500 - 1700 Protein: 65 - 75 g Fluid: 1.7 - 2 liters daily  Skin: intact  Diet Order: Renal  EDUCATION NEEDS: -No education needs identified at this time   Intake/Output Summary (Last 24 hours) at 02/28/13 1214 Last data filed at 02/28/13 0947  Gross per 24 hour  Intake 1268.75 ml  Output    560 ml  Net 708.75 ml    Last BM: PTA  Labs:   Recent Labs Lab 02/27/13 1916 02/27/13 1919 02/27/13 2302 02/28/13 0345 02/28/13 0633  NA 141 139  --  144  --   K 6.8* 6.6* 5.1 5.8* 6.5*  CL 117* 109  --  115*  --   CO2  --  14*  --  15*  --   BUN 134* 123*  --  104*  --   CREATININE 3.30* 2.96*  --  2.19*  --   CALCIUM  --  9.6  --  8.8  --   GLUCOSE 112* 92  --  84  --     CBG (last 3)   Recent Labs  02/28/13 0756  GLUCAP 76    Scheduled Meds: . antiseptic oral rinse  15 mL Mouth Rinse BID  . aspirin  325 mg Oral Daily  . cefTRIAXone (ROCEPHIN)  IV  1 g Intravenous Q24H  . citalopram  20 mg Oral Daily  . docusate sodium  100 mg Oral BID  . ferrous sulfate  325 mg Oral TID WC  . folic acid  1 mg Oral Daily  . heparin  5,000 Units Subcutaneous Q8H  . insulin glargine  5 Units Subcutaneous QHS  . simvastatin  10 mg Oral q1800  . sodium chloride  3 mL Intravenous Q12H  . tamsulosin  0.4 mg Oral QPC breakfast    Continuous Infusions: . dextrose 5 % 1,000 mL with sodium bicarbonate 150 mEq infusion 125 mL/hr at 02/28/13 0867    Past Medical History  Diagnosis Date  . Cardiomyopathy     EF 20-25% by echo 01/2010 with severe LVH felt secondary to substance abuse (no ischemic workup)  . 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  Diabetes mellitus   . HTN (hypertension)   . Polysubstance abuse     Reported hx of cocaine, THC, heroin, EtOH abuse  . Noncompliance   . Peripheral vascular disease   . CHF (congestive heart failure)   . Pneumonia   . Dependent for walking   . Wheelchair bound   . Acute renal failure   . Gangrene of foot   . Streptococcal pneumonia     Past Surgical History  Procedure Laterality Date  . Total hip arthroplasty    . Amputation  08/15/2011    Procedure: AMPUTATION ABOVE KNEE;  Surgeon: Elam Dutch, MD;  Location: [facility];  Service: Vascular;  Laterality: Left;  Amputation End:  6195  . Femoral-tibial bypass graft  12/23/2011    Procedure: BYPASS GRAFT FEMORAL-TIBIAL ARTERY;  Surgeon: Elam Dutch, MD;  Location: [facility] OR;  Service: Vascular;  Laterality: Right;  Right Popliteal - Tibial Artery Bypass using Reversed Saphenous vein    Inda Coke MS, RD, LDN Pager: [phone] After-hours pager: [phone]

## 2013-02-28 NOTE — Clinical Social Work Psychosocial (Signed)
Clinical Social Work Department BRIEF PSYCHOSOCIAL ASSESSMENT 02/28/2013  Patient:  Jeremy Howell, Jeremy Howell     Account Number:  [id number]     Admit date:  02/27/2013  Clinical Social Worker:  Wylene Men  Date/Time:  02/28/2013 02:49 PM  Referred by:  Physician  Date Referred:  02/28/2013 Referred for  SNF Placement   Other Referral:   none   Interview type:  Other - See comment Other interview type:   Pt and daughter, Jeremy Howell    PSYCHOSOCIAL DATA Living Status:  FACILITY Admitted from facility:  [facility] Level of care:  [place] Primary support name:  Jeremy Howell Primary support relationship to patient:  CHILD, ADULT Degree of support available:   adequate    CURRENT CONCERNS Current Concerns  Post-Acute Placement   Other Concerns:   none    SOCIAL WORK ASSESSMENT / PLAN CSW assessed pt at bedside.  Pt was alert and oriented x1. Pt was poor historian.  Daughter was at bedside and provided hx.  Daughter stated that pt was living at [facility] prior to hospital admission.  Pt is on long-term care unit there.  Pt has been there for approximately 3 years.  It is patient's and daughter's intentions for pt to return to [facility] upon dc from the hospital.    Pt daughter seemed to be coping well with pt placement. Daughter is very appreciative of [facility]'s support re: caretaking needs for her dad.   Assessment/plan status:  Psychosocial Support/Ongoing Assessment of Needs Other assessment/ plan:   none   Information/referral to community resources:   SNF    PATIENT'S/FAMILY'S RESPONSE TO PLAN OF CARE: Pt and daughter is appreciative of both SNF assistance and CSW assistance for pt.       Nonnie Done, [place] [phone]  Clinical Social Work

## 2013-02-28 NOTE — Progress Notes (Signed)
CRITICAL VALUE ALERT  Critical value received:  Potassium 6.5  Date of notification:  02/28/13  Time of notification:  0725  Critical value read back:yes  Nurse who received alert:  Audria Nine RN  MD notified (1st page):  Ghimire  Time of first page:  [phone]  MD notified (2nd page):  Time of second page:  Responding MD:  Sloan Leiter  Time MD responded:  [phone]

## 2013-02-28 NOTE — Progress Notes (Signed)
[facility] PROGRESS NOTE  Laddie Math YHC:[id number] DOB: 09-Dec-1955 DOA: 02/27/2013 PCP: Hennie Duos, MD  HPI/Subjective: Shary Decamp, [phone], with a PMH of cardiomyopathy, CVA, DM, HTN, CHF, and a L lower leg amputation was brought to ED from SNF complaining of urinary retention and dehydration.  1/20 Pt seems uncomfortable this morning. He does suffer from aphasia and could not communicate fully. He did not answer when asked about any pain in the abdomen. He was able to urinate about 12ml of urine and had no BMs this am.     Assessment/Plan:  Acute renal failure  - Etiology not very clear, patient claims to have incomplete voiding of urine for the past 3 weeks, however on palpation there is not a distended bladder. Insignificant amount of residual urine on bladder ultrasound. Is on Lasix and ACE inhibitor, which can be the etiology. - Continue to hydrate, given hyperkalemia Will switch to IV fluids with bicarbonate. - Check a renal ultrasound to see if bilateral hydronephrosis, if so we'll place a Foley catheter. Have started empiric Flomax.  Metabolic acidosis -Due to acute renal failure -Bicarb infusion -ambmet.  Hyperkalemia -Likely due to acute renal failure -Patient given Kayexalate, D50 and insulin again this morning, have switched to bicarbonate infusion -follow bmet this afternoon  UTI - UA shows, + LE, - Nitrite, 7-10 WBC - Placed on Rocephin - Waiting on Urine Cx  HTN -Holding BP medications -BP is within normal limits.  CHF- chronic systolic heart failure Ef of  30 - 35% in 02/2011 Appears euvolemic Lasix being held due to ARF Will monitor closely at patient is on continuous IVF  Hx of stroke Continue 325 mg aspirin.  DVT Prophylaxis:  heparin  Code Status: Full Family Communication: Sister-in-law at bedside Disposition Plan: Inpatient from SNF  Consultants:   Procedures:  Renal US   1/20  Antibiotics:  Rocephin  Objective: Filed Vitals:   02/27/13 2209 02/27/13 2315 02/27/13 2323 02/28/13 0457  BP: 106/55 108/65 117/72 128/75  Pulse: 97 100 91 73  Temp:   98.6 F (37 C) 97.7 F (36.5 C)  TempSrc:   Oral Oral  Resp: 15 24    Height:   5\' 7"  (1.702 m)   Weight:   59.5 kg (131 lb 2.8 oz)   SpO2: 97% 97% 97% 100%    Intake/Output Summary (Last 24 hours) at 02/28/13 1100 Last data filed at 02/28/13 0947  Gross per 24 hour  Intake 1268.75 ml  Output    560 ml  Net 708.75 ml   Filed Weights   02/27/13 2323  Weight: 59.5 kg (131 lb 2.8 oz)    Exam: General: Uncomfortable appearing. Looks older than stated age. HEENT:  EOMI,  Missing many teeth Neck: no JVD, no masses  Cardiovascular: RRR, S1 S2 auscultated, no rubs, murmurs or gallops.   Respiratory: Clear to auscultation bilaterally with equal chest rise  Abdomen: Mildly rigid, but pt may have been flexing muscles, nontender, nondistended, + bowel sounds  Extremities: warm dry without cyanosis clubbing or edema.  Skin: Without rashes exudates or nodules.   Psych: Pt suffers aphasia possible from past CVA; Lacks proper judgment and insight   Data Reviewed: Basic Metabolic Panel:  Recent Labs Lab 02/27/13 1916 02/27/13 1919 02/27/13 2302 02/28/13 0345 02/28/13 0633  NA 141 139  --  144  --   K 6.8* 6.6* 5.1 5.8* 6.5*  CL 117* 109  --  115*  --   CO2  --  14*  --  15*  --   GLUCOSE 112* 92  --  84  --   BUN 134* 123*  --  104*  --   CREATININE 3.30* 2.96*  --  2.19*  --   CALCIUM  --  9.6  --  8.8  --    CBC:  Recent Labs Lab 02/27/13 1916 02/27/13 1919 02/28/13 0345  WBC  --  5.8 4.8  NEUTROABS  --  3.5  --   HGB 16.3 14.3 12.3*  HCT 48.0 40.8 36.3*  MCV  --  76.7* 77.2*  PLT  --  294 268   Cardiac Enzymes:  Recent Labs Lab 02/28/13 0840  CKTOTAL 98   CBG:  Recent Labs Lab 02/28/13 0756  GLUCAP 76    Recent Results (from the past 240 hour(s))  MRSA PCR  SCREENING     Status: None   Collection Time    02/28/13 12:18 AM      Result Value Range Status   MRSA by PCR NEGATIVE  NEGATIVE Final   Comment:            The GeneXpert MRSA Assay (FDA     approved for NASAL specimens     only), is one component of a     comprehensive MRSA colonization     surveillance program. It is not     intended to diagnose MRSA     infection nor to guide or     monitor treatment for     MRSA infections.     Studies: No results found.  Scheduled Meds: . antiseptic oral rinse  15 mL Mouth Rinse BID  . aspirin  325 mg Oral Daily  . cefTRIAXone (ROCEPHIN)  IV  1 g Intravenous Q24H  . citalopram  20 mg Oral Daily  . docusate sodium  100 mg Oral BID  . ferrous sulfate  325 mg Oral TID WC  . folic acid  1 mg Oral Daily  . heparin  5,000 Units Subcutaneous Q8H  . insulin glargine  5 Units Subcutaneous QHS  . simvastatin  10 mg Oral q1800  . sodium chloride  3 mL Intravenous Q12H  . tamsulosin  0.4 mg Oral QPC breakfast   Continuous Infusions: . dextrose 5 % 1,000 mL with sodium bicarbonate 150 mEq infusion 125 mL/hr at 02/28/13 8657    Principal Problem:   AKI (acute kidney injury) Active Problems:   Diabetes mellitus   HTN (hypertension)   Dehydration   Urinary retention   Hyperkalemia    Lucile Shutters, PA-S  Imogene Burn, PA-C [facility] Pager [phone]. If 7PM-7AM, please contact night-coverage at [email or web address], password [facility] 02/28/2013, 11:00 AM  LOS: 1 day   Attending Patient seen and examined, agree with the above assessment and plan. Transferred from skilled nursing facility for urinary retention and dehydration. In the ED was found to have a residual urine of 170 on bladder ultrasound. However was found to be in acute renal failure with hyperkalemia along with metabolic acidosis. Started on IV fluids and admitted. This morning creatinine has improved, however still persistently hypernatremic. Have treated with insulin/D50,  Kayexalate and have started bicarbonate infusion. Patient seems to be voiding spontaneously. We'll continue to monitor very carefully. Rest as above  Nena Alexander MD

## 2013-03-01 DIAGNOSIS — R4701 Aphasia: Secondary | ICD-10-CM

## 2013-03-01 DIAGNOSIS — D649 Anemia, unspecified: Secondary | ICD-10-CM

## 2013-03-01 DIAGNOSIS — J9600 Acute respiratory failure, unspecified whether with hypoxia or hypercapnia: Secondary | ICD-10-CM

## 2013-03-01 DIAGNOSIS — I5042 Chronic combined systolic (congestive) and diastolic (congestive) heart failure: Secondary | ICD-10-CM

## 2013-03-01 DIAGNOSIS — I509 Heart failure, unspecified: Secondary | ICD-10-CM

## 2013-03-01 LAB — BASIC METABOLIC PANEL
BUN: 36 mg/dL — AB (ref 6–23)
CHLORIDE: 107 meq/L (ref 96–112)
CO2: 28 mEq/L (ref 19–32)
Calcium: 8.3 mg/dL — ABNORMAL LOW (ref 8.4–10.5)
Creatinine, Ser: 1.09 mg/dL (ref 0.50–1.35)
GFR calc non Af Amer: 73 mL/min — ABNORMAL LOW (ref >90)
GFR, EST AFRICAN AMERICAN: 85 mL/min — AB (ref >90)
GLUCOSE: 116 mg/dL — AB (ref 70–99)
Potassium: 4 mEq/L (ref 3.7–5.3)
Sodium: 146 mEq/L (ref 137–147)

## 2013-03-01 LAB — GLUCOSE, CAPILLARY
GLUCOSE-CAPILLARY: 113 mg/dL — AB (ref 70–99)
GLUCOSE-CAPILLARY: 110 mg/dL — AB (ref 70–99)
GLUCOSE-CAPILLARY: 176 mg/dL — AB (ref 70–99)
Glucose-Capillary: 104 mg/dL — ABNORMAL HIGH (ref 70–99)

## 2013-03-01 MED ORDER — POLYETHYLENE GLYCOL 3350 17 G PO PACK
17.0000 g | PACK | Freq: Every day | ORAL | Status: DC
  Administered 2013-03-01 – 2013-03-02 (×2): 17 g via ORAL
  Filled 2013-03-01 (×2): qty 1

## 2013-03-01 NOTE — Progress Notes (Signed)
[facility] PROGRESS NOTE  Jeremy Howell PJK:[id number] DOB: 28-Oct-1955 DOA: 02/27/2013 PCP: Hennie Duos, MD  HPI/Subjective: Jeremy Howell, [phone], with a PMH of cardiomyopathy, CVA, DM, HTN, CHF, and a L lower leg amputation was brought to ED from SNF complaining of urinary retention and dehydration.  1/20 Pt seems uncomfortable this morning. He does suffer from aphasia and could not communicate fully. He did not answer when asked about any pain in the abdomen. He was able to urinate about 113ml of urine and had no BMs this am.     Subjective: Nursing staff reported that nurse tech so some blood with bowel movement. Patient is demented has no idea if he had that before. We'll hold on his discharge and check CBC in a.m.  Assessment/Plan:    Acute renal failure  - Etiology not very clear, this is could be secondary to Lasix, ACE inhibitors and dehydration. - Continue to hydrate, given hyperkalemia Will switch to IV fluids with bicarbonate. - Normal bilateral renal ultrasound  Metabolic acidosis -Due to acute renal failure -Bicarb infusion -ambmet.  Hyperkalemia -Likely due to acute renal failure -Patient given Kayexalate, D50 and insulin again this morning, have switched to bicarbonate infusion -follow bmet this afternoon  UTI - UA shows, + LE, - Nitrite, 7-10 WBC - Placed on Rocephin - Waiting on Urine Cx  HTN -Holding BP medications -BP is within normal limits.  CHF- chronic systolic heart failure Ef of  30 - 35% in 02/2011 Appears euvolemic Lasix being held due to ARF Will monitor closely at patient is on continuous IVF  Hx of stroke Continue 325 mg aspirin.  DVT Prophylaxis:  heparin  Code Status: Full Family Communication: Sister-in-law at bedside Disposition Plan: Inpatient from SNF  Consultants:   Procedures:  Renal US  1/20  Antibiotics:  Rocephin  Objective: Filed Vitals:   02/28/13 1323 02/28/13 2109 03/01/13 0300 03/01/13  1300  BP: 120/76 120/80 146/79 134/78  Pulse: 83 80 75 104  Temp: 97.4 F (36.3 C) 98.1 F (36.7 C) 98.4 F (36.9 C) 99.8 F (37.7 C)  TempSrc: Oral Oral Oral Oral  Resp: 20 20 20 20   Height:      Weight:      SpO2: 99% 100% 99% 96%    Intake/Output Summary (Last 24 hours) at 03/01/13 1525 Last data filed at 03/01/13 1300  Gross per 24 hour  Intake      0 ml  Output   1230 ml  Net  -1230 ml   Filed Weights   02/27/13 2323  Weight: 59.5 kg (131 lb 2.8 oz)    Exam: General: Uncomfortable appearing. Looks older than stated age. HEENT:  EOMI,  Missing many teeth Neck: no JVD, no masses  Cardiovascular: RRR, S1 S2 auscultated, no rubs, murmurs or gallops.   Respiratory: Clear to auscultation bilaterally with equal chest rise  Abdomen: Mildly rigid, but pt may have been flexing muscles, nontender, nondistended, + bowel sounds  Extremities: warm dry without cyanosis clubbing or edema.  Skin: Without rashes exudates or nodules.   Psych: Pt suffers aphasia possible from past CVA; Lacks proper judgment and insight   Data Reviewed: Basic Metabolic Panel:  Recent Labs Lab 02/27/13 1916 02/27/13 1919 02/27/13 2302 02/28/13 0345 02/28/13 0633 02/28/13 1330 03/01/13 0650  NA 141 139  --  144  --  144 146  K 6.8* 6.6* 5.1 5.8* 6.5* 5.1 4.0  CL 117* 109  --  115*  --  110 107  CO2  --  14*  --  15*  --  20 28  GLUCOSE 112* 92  --  84  --  111* 116*  BUN 134* 123*  --  104*  --  74* 36*  CREATININE 3.30* 2.96*  --  2.19*  --  1.46* 1.09  CALCIUM  --  9.6  --  8.8  --  8.8 8.3*   CBC:  Recent Labs Lab 02/27/13 1916 02/27/13 1919 02/28/13 0345  WBC  --  5.8 4.8  NEUTROABS  --  3.5  --   HGB 16.3 14.3 12.3*  HCT 48.0 40.8 36.3*  MCV  --  76.7* 77.2*  PLT  --  294 268   Cardiac Enzymes:  Recent Labs Lab 02/28/13 0840  CKTOTAL 98   CBG:  Recent Labs Lab 02/28/13 1251 02/28/13 1718 02/28/13 2216 03/01/13 0754 03/01/13 1230  GLUCAP 111* 123* 145* 113*  110*    Recent Results (from the past 240 hour(s))  MRSA PCR SCREENING     Status: None   Collection Time    02/28/13 12:18 AM      Result Value Range Status   MRSA by PCR NEGATIVE  NEGATIVE Final   Comment:            The GeneXpert MRSA Assay (FDA     approved for NASAL specimens     only), is one component of a     comprehensive MRSA colonization     surveillance program. It is not     intended to diagnose MRSA     infection nor to guide or     monitor treatment for     MRSA infections.     Studies: US Renal  02/28/2013   CLINICAL DATA:  Acute renal failure, history of diabetes and hypertension  EXAM: RENAL/URINARY TRACT ULTRASOUND COMPLETE  COMPARISON:  None.  FINDINGS: Right Kidney:  Length: 9.5 cm. Echogenicity within normal limits. No mass or hydronephrosis visualized.  Left Kidney:  Length: 5.6 cm. Echogenicity within normal limits. No mass or hydronephrosis visualized.  Bladder:  Appears normal for degree of bladder distention.  IMPRESSION: There is no evidence of hydronephrosis nor cortical echogenicity change to explain the patient's acute renal failure.   Electronically Signed   By: David  Martinique   On: 02/28/2013 16:42    Scheduled Meds: . antiseptic oral rinse  15 mL Mouth Rinse BID  . aspirin  325 mg Oral Daily  . cefTRIAXone (ROCEPHIN)  IV  1 g Intravenous Q24H  . citalopram  20 mg Oral Daily  . docusate sodium  100 mg Oral BID  . feeding supplement (RESOURCE BREEZE)  1 Container Oral BID BM  . ferrous sulfate  325 mg Oral TID WC  . folic acid  1 mg Oral Daily  . heparin  5,000 Units Subcutaneous Q8H  . simvastatin  10 mg Oral q1800  . sodium chloride  3 mL Intravenous Q12H  . tamsulosin  0.4 mg Oral QPC breakfast   Continuous Infusions: .  sodium bicarbonate  infusion 1000 mL 125 mL/hr at 03/01/13 1347    Principal Problem:   AKI (acute kidney injury) Active Problems:   Diabetes mellitus   HTN (hypertension)   Dehydration   Urinary retention    Hyperkalemia    [facility] A [facility] Pager [phone]. If 7PM-7AM, please contact night-coverage at [email or web address], password [facility] 03/01/2013, 3:25 PM  LOS: 2 days

## 2013-03-01 NOTE — Discharge Summary (Signed)
. Physician Discharge Summary  Jeremy Howell [id number] DOB: 03/28/1955 DOA: 02/27/2013  PCP: Hennie Duos, MD  Admit date: 02/27/2013 Discharge date: 03/02/2013  Time spent: 60 minutes  Recommendations for Outpatient Follow-up:  -Check a hemoglobin (03/03/13)  to monitor for any possible acute blood loss.  Pt was kept an additional day due to nursing staff report of some blood in BM on 03/01/13.   - F/u with PCP for antihypertensives that may have caused symptoms of oliguria and dehydration.   - F/u with PT at SNF regarding right leg pain - D/C back to SNF- [facility]  Discharge Diagnoses:  Principal Problem:   AKI (acute kidney injury) Active Problems:   Diabetes mellitus   HTN (hypertension)   Dehydration   Urinary retention   Hyperkalemia   Discharge Condition: stable; back to SNF  Diet recommendation: Renal diet  Filed Weights   02/27/13 2323 03/02/13 0554  Weight: 59.5 kg (131 lb 2.8 oz) 62.6 kg (138 lb 0.1 oz)    History of present illness:  Jeremy Howell, 58 yo, with PMH of CHF, CVA, DM, HTN, PVD comes in with complaints of oliguria and dehydration.   Pt is doing much better this am (03/02/13).  He is more talkative and appears to have more energy. He denies any abdominal pain. His sister in law states that he has been urinating without issue.  Pt states having a R leg pain starting in his distal medial thigh.    Hospital Course:    Acute Renal Failure with oliguria - Etiology not very clear, patient claims to have incomplete voiding of urine for the past 3 weeks - Insignificant amount of residual urine on bladder ultrasound. Held Lasix and ACE inhibitor, which could be the etiology - Renal US was negative for hydronephrosis - Was given IVF with bicarb for metabolic acidosis due to renal failure. - Was given empiric Flomax - Creatinine has reduced to normal range of 1.05 today 03/02/13 - Acute Renal failure resolved  Metabolic Acidosis -Due to acute  renal failure  -Bicarb infusion was given -Bmet today (03/02/13) shows bicarb level of 28 -Metabolic acidosis resolved  Hyperkalemia -Likely due to ARF -EKG on 1/20 shows no remarkable abnormalities -Patient was given Kayexalate, D50 and insulin. Switched to bicarbonate infusion 02/28/13 -Bmet this am (03/02/13) shows potassium levels at 4.2.  -Discontinued bicarb infusion as hyperkalemia has resolved  UTI  - UA shows, + LE, - Nitrite, 7-10 WBC  - Placed on Rocephin  - Will be switched to Ciprofloxacin 500mg  BID  X 2 days upon discharge  HTN  -Held BP medications inpatient -Resumed Coreg at discharge. Continue to hold ACEI .  Restart at PCPs discretion. -Have Pt f/u with PCP regarding HTN meds that may have caused his ARF  CHF- chronic systolic heart failure  -Ef of 30 - 35% in 02/2011  -Appears euvolemic  -Lasix held due to ARF inpatient -Resume Lasix at discharge    Hx of stroke  -Continue 325 mg aspirin.  R leg pain - Likely secondary to immobility. - Peripheral pulses intact with no edema or swelling - Will have PT at SNF f/u   Procedures:  Renal US- 1/20   Discharge Exam: Filed Vitals:   03/02/13 0554  BP: 133/67  Pulse: 86  Temp: 98 F (36.7 C)  Resp: 20    General: NAD, Pt looks states age.  HEENT: No JVD, missing several teeth Cardiovascular: RRR, no M/G/R Respiratory: Clear to auscultation, equal bilateral chest rise  Abdomen: Soft, non-distended; +BS.  Anal area - no visible blood, no external hemorrhoids. Extremities: Pain of R leg on medial distal thigh upon palpation and movement. Peripheral pulses intact on R leg  Psych: Appropriate demeanor and affect. Suffers from aphasia likely from previous stroke  Discharge Instructions      Discharge Orders   Future Appointments Provider Department Dept Phone   03/27/2013 11:00 AM [place] [phone]   04/10/2013 10:30 AM Jerene Bears, MD [place] [phone]   08/09/2013 10:30 AM [facility] [address] [phone]   08/09/2013 11:00 AM [facility] [address] [place] [facility] [address] [phone]   08/09/2013 11:40 AM Sharmon Leyden Nickel, NP [facility] [phone]   Future Orders Complete By Expires   Diet - low sodium heart healthy  As directed    Increase activity slowly  As directed        Medication List    STOP taking these medications       lisinopril 10 MG tablet  Commonly known as:  PRINIVIL,ZESTRIL      TAKE these medications       aspirin 325 MG tablet  Take 325 mg by mouth daily.     carvedilol 25 MG tablet  Commonly known as:  COREG  Take 25 mg by mouth daily.     ciprofloxacin 500 MG tablet  Commonly known as:  CIPRO  Take 1 tablet (500 mg total) by mouth 2 (two) times daily.     citalopram 20 MG tablet  Commonly known as:  CELEXA  Take 20 mg by mouth daily.     docusate sodium 100 MG capsule  Commonly known as:  COLACE  Take 100 mg by mouth 2 (two) times daily.     ferrous sulfate 325 (65 FE) MG tablet  Take 325 mg by mouth 3 (three) times daily with meals.     folic acid 1 MG tablet  Commonly known as:  FOLVITE  Take 1 mg by mouth daily.     furosemide 20 MG tablet  Commonly known as:  LASIX  Take 20 mg by mouth daily.     LANTUS SOLOSTAR 100 UNIT/ML Solostar Pen  Generic drug:  Insulin Glargine  Inject 5 Units into the skin every evening.     magnesium hydroxide 400 MG/5ML suspension  Commonly known as:  MILK OF MAGNESIA  Take by mouth daily as needed for mild constipation.     Olopatadine HCl 0.2 % Soln  Place 1 drop into both eyes every evening.     pravastatin 20 MG tablet  Commonly known as:  PRAVACHOL  Take 20 mg by mouth daily.     tamsulosin 0.4 MG Caps capsule  Commonly known as:  FLOMAX  Take 1 capsule (0.4 mg total) by mouth daily after breakfast.     Vitamin D (Ergocalciferol)  50000 UNITS Caps capsule  Commonly known as:  DRISDOL  Take 50,000 Units by mouth every 30 (thirty) days. 14th of the month       Allergies  Allergen Reactions  . Almond Oil Shortness Of Breath and Swelling      The results of significant diagnostics from this hospitalization (including imaging, microbiology, ancillary and laboratory) are listed below for reference.    Significant Diagnostic Studies: US Renal  02/28/2013   CLINICAL DATA:  Acute renal failure, history of diabetes and hypertension  EXAM: RENAL/URINARY TRACT  ULTRASOUND COMPLETE  COMPARISON:  None.  FINDINGS: Right Kidney:  Length: 9.5 cm. Echogenicity within normal limits. No mass or hydronephrosis visualized.  Left Kidney:  Length: 5.6 cm. Echogenicity within normal limits. No mass or hydronephrosis visualized.  Bladder:  Appears normal for degree of bladder distention.  IMPRESSION: There is no evidence of hydronephrosis nor cortical echogenicity change to explain the patient's acute renal failure.   Electronically Signed   By: David  Martinique   On: 02/28/2013 16:42    Microbiology: Recent Results (from the past 240 hour(s))  MRSA PCR SCREENING     Status: None   Collection Time    02/28/13 12:18 AM      Result Value Range Status   MRSA by PCR NEGATIVE  NEGATIVE Final   Comment:            The GeneXpert MRSA Assay (FDA     approved for NASAL specimens     only), is one component of a     comprehensive MRSA colonization     surveillance program. It is not     intended to diagnose MRSA     infection nor to guide or     monitor treatment for     MRSA infections.     Labs: Basic Metabolic Panel:  Recent Labs Lab 02/27/13 1919  02/28/13 0345 02/28/13 2831 02/28/13 1330 03/01/13 0650 03/02/13 0344  NA 139  --  144  --  144 146 143  K 6.6*  < > 5.8* 6.5* 5.1 4.0 4.2  CL 109  --  115*  --  110 107 104  CO2 14*  --  15*  --  20 28 28   GLUCOSE 92  --  84  --  111* 116* 105*  BUN 123*  --  104*  --  74* 36* 16   CREATININE 2.96*  --  2.19*  --  1.46* 1.09 1.05  CALCIUM 9.6  --  8.8  --  8.8 8.3* 8.3*  < > = values in this interval not displayed.  CBC:  Recent Labs Lab 02/27/13 1916 02/27/13 1919 02/28/13 0345 03/02/13 0344  WBC  --  5.8 4.8 4.6  NEUTROABS  --  3.5  --   --   HGB 16.3 14.3 12.3* 10.9*  HCT 48.0 40.8 36.3* 33.7*  MCV  --  76.7* 77.2* 78.7  PLT  --  294 268 246   Cardiac Enzymes:  Recent Labs Lab 02/28/13 0840  CKTOTAL 98   CBG:  Recent Labs Lab 03/01/13 0754 03/01/13 1230 03/01/13 1729 03/01/13 2150 03/02/13 0807  GLUCAP 113* 110* 176* 104* 100*       Signed:  Carolan Clines, PA-S Imogene Burn, PA-C  [facility] 03/02/2013, 11:04 AM

## 2013-03-02 DIAGNOSIS — I70269 Atherosclerosis of native arteries of extremities with gangrene, unspecified extremity: Secondary | ICD-10-CM

## 2013-03-02 LAB — BASIC METABOLIC PANEL
BUN: 16 mg/dL (ref 6–23)
CALCIUM: 8.3 mg/dL — AB (ref 8.4–10.5)
CO2: 28 mEq/L (ref 19–32)
Chloride: 104 mEq/L (ref 96–112)
Creatinine, Ser: 1.05 mg/dL (ref 0.50–1.35)
GFR, EST AFRICAN AMERICAN: 89 mL/min — AB (ref >90)
GFR, EST NON AFRICAN AMERICAN: 76 mL/min — AB (ref >90)
Glucose, Bld: 105 mg/dL — ABNORMAL HIGH (ref 70–99)
Potassium: 4.2 mEq/L (ref 3.7–5.3)
SODIUM: 143 meq/L (ref 137–147)

## 2013-03-02 LAB — CBC
HCT: 33.7 % — ABNORMAL LOW (ref 39.0–52.0)
Hemoglobin: 10.9 g/dL — ABNORMAL LOW (ref 13.0–17.0)
MCH: 25.5 pg — AB (ref 26.0–34.0)
MCHC: 32.3 g/dL (ref 30.0–36.0)
MCV: 78.7 fL (ref 78.0–100.0)
PLATELETS: 246 10*3/uL (ref 150–400)
RBC: 4.28 MIL/uL (ref 4.22–5.81)
RDW: 15.5 % (ref 11.5–15.5)
WBC: 4.6 10*3/uL (ref 4.0–10.5)

## 2013-03-02 LAB — GLUCOSE, CAPILLARY
GLUCOSE-CAPILLARY: 95 mg/dL (ref 70–99)
Glucose-Capillary: 100 mg/dL — ABNORMAL HIGH (ref 70–99)

## 2013-03-02 MED ORDER — TAMSULOSIN HCL 0.4 MG PO CAPS: 0.4000 mg | ORAL_CAPSULE | Freq: Every day | ORAL | Status: AC

## 2013-03-02 MED ORDER — CIPROFLOXACIN HCL 500 MG PO TABS: 500.0000 mg | ORAL_TABLET | Freq: Two times a day (BID) | ORAL | Status: AC

## 2013-03-02 NOTE — Progress Notes (Signed)
Nsg Discharge Note  Admit Date:  02/27/2013 Discharge date: 03/02/2013   Jeremy Howell to be D/C'd Nursing Home per MD order.  AVS completed.  Copy for chart, and copy for patient signed, and dated. Patient/caregiver able to verbalize understanding.  Discharge Medication:   Medication List    STOP taking these medications       lisinopril 10 MG tablet  Commonly known as:  PRINIVIL,ZESTRIL      TAKE these medications       aspirin 325 MG tablet  Take 325 mg by mouth daily.     carvedilol 25 MG tablet  Commonly known as:  COREG  Take 25 mg by mouth daily.     ciprofloxacin 500 MG tablet  Commonly known as:  CIPRO  Take 1 tablet (500 mg total) by mouth 2 (two) times daily.     citalopram 20 MG tablet  Commonly known as:  CELEXA  Take 20 mg by mouth daily.     docusate sodium 100 MG capsule  Commonly known as:  COLACE  Take 100 mg by mouth 2 (two) times daily.     ferrous sulfate 325 (65 FE) MG tablet  Take 325 mg by mouth 3 (three) times daily with meals.     folic acid 1 MG tablet  Commonly known as:  FOLVITE  Take 1 mg by mouth daily.     furosemide 20 MG tablet  Commonly known as:  LASIX  Take 20 mg by mouth daily.     LANTUS SOLOSTAR 100 UNIT/ML Solostar Pen  Generic drug:  Insulin Glargine  Inject 5 Units into the skin every evening.     magnesium hydroxide 400 MG/5ML suspension  Commonly known as:  MILK OF MAGNESIA  Take by mouth daily as needed for mild constipation.     Olopatadine HCl 0.2 % Soln  Place 1 drop into both eyes every evening.     pravastatin 20 MG tablet  Commonly known as:  PRAVACHOL  Take 20 mg by mouth daily.     tamsulosin 0.4 MG Caps capsule  Commonly known as:  FLOMAX  Take 1 capsule (0.4 mg total) by mouth daily after breakfast.     Vitamin D (Ergocalciferol) 50000 UNITS Caps capsule  Commonly known as:  DRISDOL  Take 50,000 Units by mouth every 30 (thirty) days. 14th of the month        Discharge Assessment: Filed  Vitals:   03/02/13 1433  BP: 153/85  Pulse: 103  Temp: 97.8 F (36.6 C)  Resp: 20   Skin clean, dry and intact without evidence of skin break down, no evidence of skin tears noted. IV catheter discontinued by NT Nichole, intact. Site without signs and symptoms of complications - no redness or edema noted at insertion site, patient denies c/o pain - only slight tenderness at site.  Dressing with slight pressure applied.  Patient escorted via ambulance at 1430.  [name], [name], RN 03/02/2013 2:52 PM

## 2013-03-02 NOTE — Discharge Summary (Signed)
Addendum  Patient seen and examined, chart and data base reviewed.  I agree with the above assessment and plan.  For full details please see Mrs. Imogene Burn PA note.  Recommend to check CBC and BMP within one week.  Acute renal failure likely secondary to dehydration, ACE inhibitors and Lasix   Birdie Hopes, MD [facility] Pager: [phone] 03/02/2013, 2:27 PM

## 2013-03-02 NOTE — Care Management Note (Signed)
    Page 1 of 1   03/02/2013     3:07:18 PM   CARE MANAGEMENT NOTE 03/02/2013  Patient:  Jeremy Howell, Jeremy Howell   Account Number:  [id number]  Date Initiated:  03/02/2013  Documentation initiated by:  Tomi Bamberger  Subjective/Objective Assessment:   dx aki  admit- from [facility]- snf     Action/Plan:   Anticipated DC Date:  03/02/2013   Anticipated DC Plan:  [place]  In-house referral  Clinical Social Worker      DC Planning Services  CM consult      Choice offered to / List presented to:             Status of service:  Completed, signed off Medicare Important Message given?   (If response is "NO", the following Medicare IM given date fields will be blank) Date Medicare IM given:   Date Additional Medicare IM given:    Discharge Disposition:  [place]  Per UR Regulation:  Reviewed for med. necessity/level of care/duration of stay  If discussed at [place] of Stay Meetings, dates discussed:    Comments:  03/02/13 15:02 Tomi Bamberger RN,BSN [phone] patient is from [facility], patient to dc back to SNF today, CSW following.

## 2013-03-02 NOTE — Clinical Social Work Note (Signed)
Per MD patient ready to DC back to [facility]. RN, patient, daughter, and facility notified of DC. RN given number for report. Ambulance transport requested for 2:30PM. DC packet left on chart. CSW signing off.  Liz Beach, [place], [place], [phone]

## 2013-03-06 ENCOUNTER — Non-Acute Institutional Stay (SKILLED_NURSING_FACILITY): Payer: Medicare Other | Admitting: Internal Medicine

## 2013-03-06 ENCOUNTER — Encounter: Payer: Self-pay | Admitting: Internal Medicine

## 2013-03-06 DIAGNOSIS — I5042 Chronic combined systolic (congestive) and diastolic (congestive) heart failure: Secondary | ICD-10-CM

## 2013-03-06 DIAGNOSIS — I509 Heart failure, unspecified: Secondary | ICD-10-CM

## 2013-03-06 DIAGNOSIS — I6350 Cerebral infarction due to unspecified occlusion or stenosis of unspecified cerebral artery: Secondary | ICD-10-CM

## 2013-03-06 DIAGNOSIS — N179 Acute kidney failure, unspecified: Secondary | ICD-10-CM

## 2013-03-06 DIAGNOSIS — E872 Acidosis, unspecified: Secondary | ICD-10-CM

## 2013-03-06 DIAGNOSIS — E875 Hyperkalemia: Secondary | ICD-10-CM

## 2013-03-06 DIAGNOSIS — I639 Cerebral infarction, unspecified: Secondary | ICD-10-CM

## 2013-03-06 DIAGNOSIS — I10 Essential (primary) hypertension: Secondary | ICD-10-CM

## 2013-03-06 DIAGNOSIS — R339 Retention of urine, unspecified: Secondary | ICD-10-CM

## 2013-03-06 NOTE — Assessment & Plan Note (Signed)
Secondary to ARF - improved with IVF and bicarb;C)2 on d/c was 28

## 2013-03-06 NOTE — Assessment & Plan Note (Signed)
Treated with kayaxalate, D50 and insulin and bicarb;down to normal PT d/c

## 2013-03-06 NOTE — Assessment & Plan Note (Signed)
Presentation at SNF was no urine for hours and unable to pass a foley. Upon arrival to ED pt urinated around 300 cc and gave hx incomplete voiding for several weeks; Cr 3.3 and K+ was 6.6 with acidosi; Lasix and ACE were held and Cr improved to1.05 prior to d/c

## 2013-03-06 NOTE — Assessment & Plan Note (Signed)
Treated witr Rocephin in the hospital and cipro on d/c with a stop date of 03/12/13

## 2013-03-06 NOTE — Progress Notes (Signed)
MRN: [id number] Name: Cedric Mcclaine  Sex: male Age: 58 y.o. DOB: May 04, 1955  [place] #: Helene Kelp Facility/Room: 121A Level Of Care: SNF Provider: Inocencio Homes D Emergency Contacts: Extended Emergency Contact Information Primary Emergency Contact: [facility] Address: [address]          [place], [place] [address] Johnnette Litter of [place] Phone: [phone] Work Phone: [phone] Mobile Phone: [phone] Relation: Brother Secondary Emergency Contact: Lelon Huh Address: [address]          [place], [place] [address] [place] Phone: [phone] Mobile Phone: [phone] Relation: Daughter  Code Status:   Allergies: Almond oil  Chief Complaint  Patient presents with  . nursing home admission    HPI: Patient is 58 y.o. male who has urinary retention who was hospitalized for acute renal failure, probably from lasix dehydration, being admitted to SNF.  Past Medical History  Diagnosis Date  . Cardiomyopathy     EF 20-25% by echo 01/2010 with severe LVH felt secondary to substance abuse (no ischemic workup)  . Diabetes mellitus   . HTN (hypertension)   . Polysubstance abuse     Reported hx of cocaine, THC, heroin, EtOH abuse  . Noncompliance   . Peripheral vascular disease   . CHF (congestive heart failure)   . Pneumonia   . Dependent for walking   . Wheelchair bound   . Acute renal failure   . Gangrene of foot   . Streptococcal pneumonia   . 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 Total hip arthroplasty    . Amputation  08/15/2011    Procedure: AMPUTATION ABOVE KNEE;  Surgeon: Elam Dutch, MD;  Location: [place];  Service: Vascular;  Laterality: Left;  Amputation End:  [id number]  . Femoral-tibial bypass graft  12/23/2011    Procedure: BYPASS GRAFT FEMORAL-TIBIAL ARTERY;  Surgeon: Elam Dutch, MD;   Location: [facility] OR;  Service: Vascular;  Laterality: Right;  Right Popliteal - Tibial Artery Bypass using Reversed Saphenous vein      Medication List       This list is accurate as of: 03/06/13  1:43 PM.  Always use your most recent med list.               aspirin 325 MG tablet  Take 325 mg by mouth daily.     carvedilol 25 MG tablet  Commonly known as:  COREG  Take 25 mg by mouth daily.     ciprofloxacin 500 MG tablet  Commonly known as:  CIPRO  Take 1 tablet (500 mg total) by mouth 2 (two) times daily.     citalopram 20 MG tablet  Commonly known as:  CELEXA  Take 20 mg by mouth daily.     docusate sodium 100 MG capsule  Commonly known as:  COLACE  Take 100 mg by mouth 2 (two) times daily.     ferrous sulfate 325 (65 FE) MG tablet  Take 325 mg by mouth 3 (three) times daily with meals.     folic acid 1 MG tablet  Commonly known as:  FOLVITE  Take 1 mg by mouth daily.     furosemide 20 MG tablet  Commonly known as:  LASIX  Take 20 mg by mouth daily.     LANTUS SOLOSTAR 100 UNIT/ML Solostar Pen  Generic drug:  Insulin Glargine  Inject 5 Units into the skin every evening.     magnesium hydroxide 400 MG/5ML suspension  Commonly known as:  MILK OF MAGNESIA  Take by mouth daily as needed for mild constipation.     Olopatadine HCl 0.2 % Soln  Place 1 drop into both eyes every evening.     pravastatin 20 MG tablet  Commonly known as:  PRAVACHOL  Take 20 mg by mouth daily.     tamsulosin 0.4 MG Caps capsule  Commonly known as:  FLOMAX  Take 1 capsule (0.4 mg total) by mouth daily after breakfast.     Vitamin D (Ergocalciferol) 50000 UNITS Caps capsule  Commonly known as:  DRISDOL  Take 50,000 Units by mouth every 30 (thirty) days. 14th of the month        No orders of the defined types were placed in this encounter.    Immunization History  Administered Date(s) Administered  . Influenza Split 10/24/2011  . Influenza-Unspecified 11/09/2012  . PPD Test  07/23/2011  . Pneumococcal Polysaccharide-23 10/24/2011  . Pneumococcal-Unspecified 09/12/2012    History  Substance Use Topics  . Smoking status: Former Smoker -- 15 years    Types: Cigarettes  . Smokeless tobacco: Never Used     Comment: declined  . Alcohol Use: No    Family history is noncontributory    Review of Systems  DATA OBTAINED: from patient; PT HAS NO C/O GENERAL: Feels well no fevers, fatigue, appetite changes SKIN: No itching, rash or wounds EYES: No eye pain, redness, discharge EARS: No earache, tinnitus, change in hearing NOSE: No congestion, drainage or bleeding  MOUTH/THROAT: No mouth or tooth pain, No sore throat, No difficulty chewing or swallowing  RESPIRATORY: No cough, wheezing, SOB CARDIAC: No chest pain, palpitations, lower extremity edema  GI: No abdominal pain, No N/V/D or constipation, No heartburn or reflux  GU: No dysuria, frequency or urgency, or incontinence  MUSCULOSKELETAL: No unrelieved bone/joint pain NEUROLOGIC: No headache, dizziness or focal weakness PSYCHIATRIC: No overt anxiety or sadness. Sleeps well. No behavior issue.   Filed Vitals:   03/06/13 1302  BP: 109/72  Pulse: 87  Temp: 97.6 F (36.4 C)  Resp: 20    Physical Exam  GENERAL APPEARANCE: Alert, MOD conversant BUT I can't understand him Appropriately groomed. No acute distress.  SKIN: No diaphoresis rash HEAD: Normocephalic, atraumatic  EYES: Conjunctiva/lids clear. Pupils round, reactive. EOMs intact.  EARS: External exam WNL, canals clear. Hearing grossly normal.  NOSE: No deformity or discharge.  MOUTH/THROAT: Lips w/o lesions RESPIRATORY: Breathing is even, unlabored. Lung sounds are clear   CARDIOVASCULAR: Heart RRR no murmurs, rubs or gallops. No peripheral edema.  GASTROINTESTINAL: Abdomen is soft, non-tender, not distended w/ normal bowel sounds GENITOURINARY: Bladder non tender, not distended  MUSCULOSKELETAL:L AKA NEUROLOGIC: . Cranial nerves 2-12  grossly intact. Moves all extremities no tremor. PSYCHIATRIC: Mood and affect appropriate to situation, no behavioral issues  Patient Active Problem List   Diagnosis Date Noted  . Stroke   . AKI (acute kidney injury) 02/27/2013  . Urinary retention 02/27/2013  . Hyperkalemia 02/27/2013  . Pain in limb-Right Thigh 02/07/2013  . Anemia 06/13/2012  . Peripheral vascular disease, unspecified 11/26/2011  . Preoperative examination, unspecified 11/26/2011  . Acute respiratory failure with hypoxia 10/23/2011  . Dehydration 10/23/2011  . Preop cardiovascular exam 10/21/2011  . Atherosclerosis of native arteries of the extremities with gangrene 09/24/2011  . Aftercare following surgery of the circulatory system, [place] 09/24/2011  .  Gangrene of foot 08/14/2011  . PVD (peripheral vascular disease) 08/14/2011  . Metabolic acidosis 47/82/9562  . History of CVA (cerebrovascular accident) 08/14/2011  . Rhabdomyolysis 08/14/2011  . HTN (hypertension) 08/14/2011  . Hypertensive emergency 02/11/2011  . Diabetes mellitus 02/11/2011  . 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 Aphasia 02/11/2011    CBC    Component Value Date/Time   WBC 4.6 03/02/2013 0344   RBC 4.28 03/02/2013 0344   HGB 10.9* 03/02/2013 0344   HCT 33.7* 03/02/2013 0344   PLT 246 03/02/2013 0344   MCV 78.7 03/02/2013 0344   LYMPHSABS 1.5 02/27/2013 1919   MONOABS 0.7 02/27/2013 1919   EOSABS 0.1 02/27/2013 1919   BASOSABS 0.0 02/27/2013 1919    CMP     Component Value Date/Time   NA 143 03/02/2013 0344   K 4.2 03/02/2013 0344   CL 104 03/02/2013 0344   CO2 28 03/02/2013 0344   GLUCOSE 105* 03/02/2013 0344   BUN 16 03/02/2013 0344   CREATININE 1.05 03/02/2013 0344   CALCIUM 8.3* 03/02/2013 0344   PROT 8.3 12/22/2011 1144   ALBUMIN 3.5 12/22/2011 1144   AST 23 12/22/2011 1144   ALT 33 12/22/2011 1144   ALKPHOS 93 12/22/2011 1144   BILITOT 0.2* 12/22/2011  1144   GFRNONAA 76* 03/02/2013 0344   GFRAA 89* 03/02/2013 0344    Assessment and Plan  AKI (acute kidney injury) Presentation at SNF was no urine for hours and unable to pass a foley. Upon arrival to ED pt urinated around 300 cc and gave hx incomplete voiding for several weeks; Cr 3.3 and K+ was 6.6 with acidosi; Lasix and ACE were held and Cr improved to1.05 prior to d/c  Metabolic acidosis Secondary to ARF - improved with IVF and bicarb;C)2 on d/c was 28  Hyperkalemia Treated with kayaxalate, D50 and insulin and bicarb;down to normal PT d/c  Urinary retention Treated witr Rocephin in the hospital and cipro on d/c with a stop date of 03/12/13  HTN (hypertension) Bp meds were held as inpt and coreg restarted at d/c. ACE still being held to be restarted based on need and PCP discretion which I'm thinking probably not since he went inyo hyperkalemic acidotic renal failure fairly quickly  Chronic combined mod- severe systolic (EF 65-78%) and grade 1 diastolic CHF (congestive heart failure) 2013 EF 30-35%; pt on bblocker,lasix and statin with ACE being held, probably permanantly  Stroke On ASA 325 mg as prophylaxis; not a coumadin candidate sec med non compliance    Hennie Duos, MD

## 2013-03-06 NOTE — Assessment & Plan Note (Signed)
On ASA 325 mg as prophylaxis; not a coumadin candidate sec med non compliance

## 2013-03-06 NOTE — Assessment & Plan Note (Signed)
2013 EF 30-35%; pt on bblocker,lasix and statin with ACE being held, probably permanantly

## 2013-03-06 NOTE — Assessment & Plan Note (Signed)
Bp meds were held as inpt and coreg restarted at d/c. ACE still being held to be restarted based on need and PCP discretion which I'm thinking probably not since he went inyo hyperkalemic acidotic renal failure fairly quickly

## 2013-03-23 ENCOUNTER — Telehealth: Payer: Self-pay

## 2013-03-23 ENCOUNTER — Non-Acute Institutional Stay (SKILLED_NURSING_FACILITY): Payer: PRIVATE HEALTH INSURANCE | Admitting: Internal Medicine

## 2013-03-23 ENCOUNTER — Encounter: Payer: Self-pay | Admitting: Internal Medicine

## 2013-03-23 DIAGNOSIS — F418 Other specified anxiety disorders: Secondary | ICD-10-CM

## 2013-03-23 DIAGNOSIS — R339 Retention of urine, unspecified: Secondary | ICD-10-CM

## 2013-03-23 DIAGNOSIS — N189 Chronic kidney disease, unspecified: Secondary | ICD-10-CM

## 2013-03-23 DIAGNOSIS — S78119A Complete traumatic amputation at level between unspecified hip and knee, initial encounter: Secondary | ICD-10-CM

## 2013-03-23 DIAGNOSIS — I6350 Cerebral infarction due to unspecified occlusion or stenosis of unspecified cerebral artery: Secondary | ICD-10-CM

## 2013-03-23 DIAGNOSIS — N183 Chronic kidney disease, stage 3 unspecified: Secondary | ICD-10-CM | POA: Insufficient documentation

## 2013-03-23 DIAGNOSIS — Z89619 Acquired absence of unspecified leg above knee: Secondary | ICD-10-CM

## 2013-03-23 DIAGNOSIS — I739 Peripheral vascular disease, unspecified: Secondary | ICD-10-CM

## 2013-03-23 DIAGNOSIS — F341 Dysthymic disorder: Secondary | ICD-10-CM

## 2013-03-23 DIAGNOSIS — I639 Cerebral infarction, unspecified: Secondary | ICD-10-CM

## 2013-03-23 DIAGNOSIS — E785 Hyperlipidemia, unspecified: Secondary | ICD-10-CM

## 2013-03-23 DIAGNOSIS — F0390 Unspecified dementia without behavioral disturbance: Secondary | ICD-10-CM

## 2013-03-23 NOTE — Assessment & Plan Note (Signed)
Chronic;healedn no wounds, no phantom pain

## 2013-03-23 NOTE — Assessment & Plan Note (Addendum)
With dysphagia and R side paresis; continue ASA;non complinace issues for anti-coag

## 2013-03-23 NOTE — Telephone Encounter (Signed)
I have canceled the colonoscopy and upcoming pre-visit.  Patient is rescheduled with Debra at [facility] for 05/03/13 1:30

## 2013-03-23 NOTE — Assessment & Plan Note (Signed)
Pt on pravachol 20 mg with LDL> 100;will increase to pravachol 40 mg daily

## 2013-03-23 NOTE — Assessment & Plan Note (Addendum)
Chronic and stable.Hx L AKA and R fem-pop. No peripheral wounds. Pt is on ASA and a statin

## 2013-03-23 NOTE — Telephone Encounter (Signed)
Would be appropriate for an office visit before hospital colonoscopy

## 2013-03-23 NOTE — Assessment & Plan Note (Signed)
GFR 88, CrCl 68. Ace was stopped recent hospitalization sec to hyperkalemia;continue BP control

## 2013-03-23 NOTE — Progress Notes (Signed)
MRN: [id number] Name: Jeremy Howell  Sex: male Age: 58 y.o. DOB: 06/25/1955  [place] #:  Facility/Room: Level Of Care: SNF Provider: Inocencio Homes D Emergency Contacts: Extended Emergency Contact Information Primary Emergency Contact: [facility] Address: [address]          [place], [place] [address] Johnnette Litter of [place] Phone: [phone] Work Phone: [phone] Mobile Phone: [phone] Relation: Brother Secondary Emergency Contact: Lelon Huh Address: [place]          [place], [place] [address] [place] of [place] Phone: [phone] Mobile Phone: [phone] Relation: Daughter  Code Status:   Allergies: Almond oil  Chief Complaint  Patient presents with  . Medical Managment of Chronic Issues    HPI: Patient is 58 y.o. male who   Past Medical History  Diagnosis Date  . Cardiomyopathy     EF 20-25% by echo 01/2010 with severe LVH felt secondary to substance abuse (no ischemic workup)  . Diabetes mellitus   . HTN (hypertension)   . Polysubstance abuse     Reported hx of cocaine, THC, heroin, EtOH abuse  . Noncompliance   . Peripheral vascular disease   . CHF (congestive heart failure)   . Pneumonia   . Dependent for walking   . Wheelchair bound   . Acute renal failure   . Gangrene of foot   . Streptococcal pneumonia   . 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 Total hip arthroplasty    . Amputation  08/15/2011    Procedure: AMPUTATION ABOVE KNEE;  Surgeon: Elam Dutch, MD;  Location: [place];  Service: Vascular;  Laterality: Left;  Amputation End:  [id number]  . Femoral-tibial bypass graft  12/23/2011    Procedure: BYPASS GRAFT FEMORAL-TIBIAL ARTERY;  Surgeon: Elam Dutch, MD;  Location: [facility] OR;  Service: Vascular;  Laterality: Right;  Right Popliteal - Tibial Artery Bypass using Reversed  Saphenous vein      Medication List       This list is accurate as of: 03/23/13 10:44 AM.  Always use your most recent med list.               aspirin 325 MG tablet  Take 325 mg by mouth daily.     carvedilol 25 MG tablet  Commonly known as:  COREG  Take 25 mg by mouth daily.     citalopram 20 MG tablet  Commonly known as:  CELEXA  Take 20 mg by mouth daily.     docusate sodium 100 MG capsule  Commonly known as:  COLACE  Take 100 mg by mouth 2 (two) times daily.     ferrous sulfate 325 (65 FE) MG tablet  Take 325 mg by mouth 3 (three) times daily with meals.     folic acid 1 MG tablet  Commonly known as:  FOLVITE  Take 1 mg by mouth daily.     furosemide 20 MG tablet  Commonly known as:  LASIX  Take 20 mg by mouth daily.     LANTUS SOLOSTAR 100 UNIT/ML Solostar Pen  Generic drug:  Insulin Glargine  Inject 5 Units into the skin every evening.     magnesium hydroxide 400 MG/5ML suspension  Commonly known as:  MILK OF MAGNESIA  Take by mouth daily as needed for mild constipation.  Olopatadine HCl 0.2 % Soln  Place 1 drop into both eyes every evening.     pravastatin 20 MG tablet  Commonly known as:  PRAVACHOL  Take 20 mg by mouth daily.     tamsulosin 0.4 MG Caps capsule  Commonly known as:  FLOMAX  Take 1 capsule (0.4 mg total) by mouth daily after breakfast.     Vitamin D (Ergocalciferol) 50000 UNITS Caps capsule  Commonly known as:  DRISDOL  Take 50,000 Units by mouth every 30 (thirty) days. 14th of the month        No orders of the defined types were placed in this encounter.    Immunization History  Administered Date(s) Administered  . Influenza Split 10/24/2011  . Influenza-Unspecified 11/09/2012  . PPD Test 07/23/2011  . Pneumococcal Polysaccharide-23 10/24/2011  . Pneumococcal-Unspecified 09/12/2012    History  Substance Use Topics  . Smoking status: Former Smoker -- 15 years    Types: Cigarettes  . Smokeless tobacco: Never Used       Comment: declined  . Alcohol Use: No    Review of Systems  DATA OBTAINED: from patient, nurse, medical record, family member GENERAL: Feels well no fevers, fatigue, appetite changes SKIN: No itching, rash HEENT: No complaint RESPIRATORY: No cough, wheezing, SOB CARDIAC: No chest pain, palpitations, lower extremity edema  GI: No abdominal pain, No N/V/D or constipation, No heartburn or reflux  GU: No dysuria, frequency or urgency, or incontinence  MUSCULOSKELETAL: No unrelieved bone/joint pain NEUROLOGIC: No headache, dizziness or focal weakness PSYCHIATRIC: No overt anxiety or sadness. Sleeps well.   Filed Vitals:   03/23/13 1016  BP: 109/72  Pulse: 87  Temp: 97.6 F (36.4 C)  Resp: 20    Physical Exam  GENERAL APPEARANCE: Alert, conversant. Appropriately groomed. No acute distress  SKIN: No diaphoresis rash, or wounds HEENT: Unremarkable RESPIRATORY: Breathing is even, unlabored. Lung sounds are clear   CARDIOVASCULAR: Heart RRR no murmurs, rubs or gallops. No peripheral edema  GASTROINTESTINAL: Abdomen is soft, non-tender, not distended w/ normal bowel sounds.  GENITOURINARY: Bladder non tender, not distended  MUSCULOSKELETAL: No abnormal joints or musculature NEUROLOGIC: Cranial nerves 2-12 grossly intact. Moves all extremities no tremor. PSYCHIATRIC: Mood and affect appropriate to situation, no behavioral issues  Patient Active Problem List   Diagnosis Date Noted  . S/P AKA (above knee amputation) 03/23/2013  . Dementia without behavioral disturbance 03/23/2013  . Depression with anxiety 03/23/2013  . Stroke   . AKI (acute kidney injury) 02/27/2013  . Urinary retention 02/27/2013  . Hyperkalemia 02/27/2013  . Pain in limb-Right Thigh 02/07/2013  . Anemia 06/13/2012  . Peripheral vascular disease, unspecified 11/26/2011  . Preoperative examination, unspecified 11/26/2011  . Acute respiratory failure with hypoxia 10/23/2011  . Dehydration 10/23/2011  .  Preop cardiovascular exam 10/21/2011  . Atherosclerosis of native arteries of the extremities with gangrene 09/24/2011  . Aftercare following surgery of the circulatory system, [place] 09/24/2011  . Gangrene of foot 08/14/2011  . PVD (peripheral vascular disease) 08/14/2011  . Metabolic acidosis 61/44/3154  . History of CVA (cerebrovascular accident) 08/14/2011  . Rhabdomyolysis 08/14/2011  . HTN (hypertension) 08/14/2011  . Hypertensive emergency 02/11/2011  . Diabetes mellitus 02/11/2011  . Chronic combined mod- severe systolic (EF 00-86%) and grade 1 diastolic CHF (congestive heart failure) 02/11/2011  . Aphasia 02/11/2011    CBC    Component Value Date/Time   WBC 4.6 03/02/2013 0344   RBC 4.28 03/02/2013 0344   HGB 10.9* 03/02/2013 0344  HCT 33.7* 03/02/2013 0344   PLT 246 03/02/2013 0344   MCV 78.7 03/02/2013 0344   LYMPHSABS 1.5 02/27/2013 1919   MONOABS 0.7 02/27/2013 1919   EOSABS 0.1 02/27/2013 1919   BASOSABS 0.0 02/27/2013 1919    CMP     Component Value Date/Time   NA 143 03/02/2013 0344   K 4.2 03/02/2013 0344   CL 104 03/02/2013 0344   CO2 28 03/02/2013 0344   GLUCOSE 105* 03/02/2013 0344   BUN 16 03/02/2013 0344   CREATININE 1.05 03/02/2013 0344   CALCIUM 8.3* 03/02/2013 0344   PROT 8.3 12/22/2011 1144   ALBUMIN 3.5 12/22/2011 1144   AST 23 12/22/2011 1144   ALT 33 12/22/2011 1144   ALKPHOS 93 12/22/2011 1144   BILITOT 0.2* 12/22/2011 1144   GFRNONAA 76* 03/02/2013 0344   GFRAA 89* 03/02/2013 0344    Assessment and Plan  S/P AKA (above knee amputation) Chronic;healedn no wounds, no phantom pain  Dementia without behavioral disturbance Fairly advanced. Pt is on no dementia specific meds but is on Celexa per psych for depression/anxiety; no behavoir problems  Depression with anxiety Followed by psych and is on Celexa to treat both depression and anxiety and is stable    Hennie Duos, MD

## 2013-03-23 NOTE — Assessment & Plan Note (Signed)
Continue daily flomax

## 2013-03-23 NOTE — Assessment & Plan Note (Signed)
Fairly advanced. Pt is on no dementia specific meds but is on Celexa per psych for depression/anxiety; no behavoir problems

## 2013-03-23 NOTE — Assessment & Plan Note (Signed)
Followed by psych and is on Celexa to treat both depression and anxiety and is stable

## 2013-03-23 NOTE — Telephone Encounter (Signed)
Pt with extensive hx (DM, PVD/amputee, anemia, htn, cva, acute renal failure, chf)  Does not meet anesthesia's requirements to be done at [facility] (2013 EF 30-35%).  Please schedule for hospital.

## 2013-04-10 ENCOUNTER — Encounter: Payer: Medicare Other | Admitting: Internal Medicine

## 2013-05-02 ENCOUNTER — Encounter: Payer: Self-pay | Admitting: Internal Medicine

## 2013-05-03 ENCOUNTER — Ambulatory Visit (INDEPENDENT_AMBULATORY_CARE_PROVIDER_SITE_OTHER): Payer: PRIVATE HEALTH INSURANCE | Admitting: Internal Medicine

## 2013-05-03 ENCOUNTER — Encounter: Payer: Self-pay | Admitting: Internal Medicine

## 2013-05-03 VITALS — BP 118/78 | HR 68

## 2013-05-03 DIAGNOSIS — Z1211 Encounter for screening for malignant neoplasm of colon: Secondary | ICD-10-CM

## 2013-05-03 DIAGNOSIS — K5900 Constipation, unspecified: Secondary | ICD-10-CM

## 2013-05-03 DIAGNOSIS — K5909 Other constipation: Secondary | ICD-10-CM

## 2013-05-03 MED ORDER — POLYETHYLENE GLYCOL 3350 17 G PO PACK: 17.0000 g | PACK | Freq: Every day | ORAL | Status: DC

## 2013-05-03 MED ORDER — POLYETHYLENE GLYCOL 3350 17 G PO PACK: 17.0000 g | PACK | Freq: Every day | ORAL | Status: AC

## 2013-05-03 NOTE — Patient Instructions (Signed)
You have been given cologuard test form. They will contact you to send you the test kit.  You have been a prescription for miralax take 17 grams daily

## 2013-05-03 NOTE — Progress Notes (Signed)
Patient ID: Jeremy Howell, male   DOB: October 22, 1955, 58 y.o.   MRN: [id number] HPI: Jeremy Howell is a 58 yo male with PMH of cardiomyopathy with reduced EF, diabetes, hypertension, history of polysubstance abuse, peripheral vascular disease status post BKA, stroke with right hemiparesis who resides in a assisted living facility who presents today to discuss colon cancer screening. He is here with his brother and sister-in-law.  He has never had screening colonoscopy.  He came for previsit but was referred for office visit due to his comorbidities. He has a history of probable ischemic cardiomyopathy with EF around 25%, history of stroke with right-sided weakness, peripheral vascular disease hypertension, diabetes, and is wheelchair-bound and an assisted-living facility. He is interested in colon cancer screening. He does report constipation which has been somewhat long-standing for him. He takes Colace 100 mg twice daily but this has not improved his constipation. He denies rectal bleeding. No abdominal pain. No nausea or vomiting. Appetite is maintained.  No family history of colon cancer  Past Medical History  Diagnosis Date  . Cardiomyopathy     EF 20-25% by echo 01/2010 with severe LVH felt secondary to substance abuse (no ischemic workup)  . Diabetes mellitus   . HTN (hypertension)   . Polysubstance abuse     Reported hx of cocaine, THC, heroin, EtOH abuse  . Noncompliance   . Peripheral vascular disease   . CHF (congestive heart failure)   . Pneumonia   . Dependent for walking   . Wheelchair bound   . Acute renal failure   . Gangrene of foot   . Streptococcal pneumonia   . 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 Hyperlipidemia   . Anemia   . Urinary retention   . Hyperkalemia     Past Surgical History  Procedure Laterality Date  . Total hip arthroplasty Bilateral   . Amputation  08/15/2011    Procedure: AMPUTATION  ABOVE KNEE;  Surgeon: Elam Dutch, MD;  Location: [place];  Service: Vascular;  Laterality: Left;  Amputation End:  6440  . Femoral-tibial bypass graft  12/23/2011    Procedure: BYPASS GRAFT FEMORAL-TIBIAL ARTERY;  Surgeon: Elam Dutch, MD;  Location: [facility] OR;  Service: Vascular;  Laterality: Right;  Right Popliteal - Tibial Artery Bypass using Reversed Saphenous vein    Current Outpatient Prescriptions  Medication Sig Dispense Refill  . aspirin 325 MG tablet Take 325 mg by mouth daily.      . bisacodyl (DULCOLAX) 10 MG suppository Place 10 mg rectally as needed for moderate constipation.      . carvedilol (COREG) 25 MG tablet Take 25 mg by mouth daily.       . citalopram (CELEXA) 20 MG tablet Take 20 mg by mouth daily.       Marland Kitchen docusate sodium (COLACE) 100 MG capsule Take 100 mg by mouth 2 (two) times daily.      . ferrous sulfate 325 (65 FE) MG tablet Take 325 mg by mouth 3 (three) times daily with meals.       . folic acid (FOLVITE) 1 MG tablet Take 1 mg by mouth daily.      . furosemide (LASIX) 20 MG tablet Take 20 mg by mouth daily.      Marland Kitchen LANTUS SOLOSTAR 100 UNIT/ML injection Inject 5 Units into the skin every evening.       . magnesium hydroxide (  MILK OF MAGNESIA) 400 MG/5ML suspension Take by mouth daily as needed for mild constipation.      . Olopatadine HCl 0.2 % SOLN Place 1 drop into both eyes every evening.      . pravastatin (PRAVACHOL) 20 MG tablet Take 20 mg by mouth daily.      . tamsulosin (FLOMAX) 0.4 MG CAPS capsule Take 1 capsule (0.4 mg total) by mouth daily after breakfast.  30 capsule  0  . Vitamin D, Ergocalciferol, (DRISDOL) 50000 UNITS CAPS capsule Take 50,000 Units by mouth every 30 (thirty) days. 14th of the month      . polyethylene glycol (MIRALAX / GLYCOLAX) packet Take 17 g by mouth daily.  30 each  6   No current facility-administered medications for this visit.    Allergies  Allergen Reactions  . Almond Oil Shortness Of Breath and Swelling     Family History  Problem Relation Age of Onset  . Stroke Brother   . Stroke Mother   . Heart disease Mother   . Diabetes Mother   . Hepatitis Mother   . Clotting disorder Mother   . Alzheimer's disease Mother   . Emphysema Brother     History  Substance Use Topics  . Smoking status: Former Smoker -- 15 years    Types: Cigarettes  . Smokeless tobacco: Never Used     Comment: declined  . Alcohol Use: No    ROS: As per history of present illness, otherwise negative  BP 118/78  Pulse 68 Constitutional: Chronically ill-appearing male in no acute distress, sitting in a wheelchair HEENT: Normocephalic and atraumatic. Oropharynx is clear and moist. No oropharyngeal exudate. Conjunctivae are normal.  No scleral icterus.  Neck: Neck supple. Trachea midline. Cardiovascular: Normal rate, regular rhythm and intact distal pulses. Pulmonary/chest: Effort normal and breath sounds normal. No wheezing, rales or rhonchi. Abdominal: Soft, nontender, nondistended. Bowel sounds active throughout. There are no masses palpable. No hepatosplenomegaly. Extremities: no clubbing, cyanosis, or edema, right upper extremity contracture Lymphadenopathy: No cervical adenopathy noted. Neurological: Alert and oriented to person place and time. Dysarthria Skin: Skin is warm and dry. No rashes noted. Psychiatric: Normal mood and affect. Behavior is normal.  RELEVANT LABS AND IMAGING: CBC    Component Value Date/Time   WBC 4.6 03/02/2013 0344   RBC 4.28 03/02/2013 0344   HGB 10.9* 03/02/2013 0344   HCT 33.7* 03/02/2013 0344   PLT 246 03/02/2013 0344   MCV 78.7 03/02/2013 0344   MCH 25.5* 03/02/2013 0344   MCHC 32.3 03/02/2013 0344   RDW 15.5 03/02/2013 0344   LYMPHSABS 1.5 02/27/2013 1919   MONOABS 0.7 02/27/2013 1919   EOSABS 0.1 02/27/2013 1919   BASOSABS 0.0 02/27/2013 1919    CMP     Component Value Date/Time   NA 143 03/02/2013 0344   K 4.2 03/02/2013 0344   CL 104 03/02/2013 0344   CO2 28  03/02/2013 0344   GLUCOSE 105* 03/02/2013 0344   BUN 16 03/02/2013 0344   CREATININE 1.05 03/02/2013 0344   CALCIUM 8.3* 03/02/2013 0344   PROT 8.3 12/22/2011 1144   ALBUMIN 3.5 12/22/2011 1144   AST 23 12/22/2011 1144   ALT 33 12/22/2011 1144   ALKPHOS 93 12/22/2011 1144   BILITOT 0.2* 12/22/2011 1144   GFRNONAA 76* 03/02/2013 0344   GFRAA 89* 03/02/2013 0344    ASSESSMENT/PLAN: 58 yo male with PMH of cardiomyopathy with reduced EF, diabetes, hypertension, history of polysubstance abuse, peripheral vascular disease status post  BKA, stroke with right hemiparesis who resides in a assisted living facility who presents today to discuss colon cancer screening.   1.  Colorectal cancer screening -- multiple medical comorbidities. From a colorectal cancer standpoint he is average risk. We discussed colonoscopy in the hospital setting versus Cologuard. After thorough discussion of the risks and benefits of both, he elected for Cologuard. This test will be ordered for him and if positive he understands the recommendation for colonoscopy. If it is negative it should be repeated every 3 years  2.  Chronic constipation -- add MiraLax 17 g daily. He can continue Colace 100 mg twice daily. If his stools are too loose he can reduce to every other day MiraLax.

## 2013-05-11 LAB — COLOGUARD: COLOGUARD: NEGATIVE

## 2013-06-05 ENCOUNTER — Non-Acute Institutional Stay (SKILLED_NURSING_FACILITY): Payer: PRIVATE HEALTH INSURANCE | Admitting: Internal Medicine

## 2013-06-05 ENCOUNTER — Encounter: Payer: Self-pay | Admitting: Internal Medicine

## 2013-06-05 DIAGNOSIS — I739 Peripheral vascular disease, unspecified: Secondary | ICD-10-CM

## 2013-06-05 DIAGNOSIS — F0390 Unspecified dementia without behavioral disturbance: Secondary | ICD-10-CM

## 2013-06-05 DIAGNOSIS — K5900 Constipation, unspecified: Secondary | ICD-10-CM

## 2013-06-05 DIAGNOSIS — K5909 Other constipation: Secondary | ICD-10-CM | POA: Insufficient documentation

## 2013-06-05 DIAGNOSIS — I6350 Cerebral infarction due to unspecified occlusion or stenosis of unspecified cerebral artery: Secondary | ICD-10-CM

## 2013-06-05 DIAGNOSIS — E119 Type 2 diabetes mellitus without complications: Secondary | ICD-10-CM

## 2013-06-05 DIAGNOSIS — I639 Cerebral infarction, unspecified: Secondary | ICD-10-CM

## 2013-06-05 DIAGNOSIS — E785 Hyperlipidemia, unspecified: Secondary | ICD-10-CM

## 2013-06-05 DIAGNOSIS — N189 Chronic kidney disease, unspecified: Secondary | ICD-10-CM

## 2013-06-05 NOTE — Assessment & Plan Note (Signed)
No functional decline;chronic R side weakness and aphasia but able to transfer and go about in [facility]

## 2013-06-05 NOTE — Assessment & Plan Note (Signed)
Low dose basil,A1c 5.5

## 2013-06-05 NOTE — Assessment & Plan Note (Signed)
Felt to be vascular and no significant decline

## 2013-06-05 NOTE — Assessment & Plan Note (Signed)
GFR- 88; Cr- 68CL

## 2013-06-05 NOTE — Progress Notes (Signed)
MRN: [id number] Name: Jeremy Howell  Sex: male Age: 58 y.o. DOB: 04-17-1955  [place] #: [facility] Facility/Room: [address]B Level Of Care: SNF Provider: Hennie Duos Emergency Contacts: Extended Emergency Contact Information Primary Emergency Contact: [facility] Address: [address]          [place], [place] [address] [place] of [place] Phone: [phone] Work Phone: [phone] Mobile Phone: [phone] Relation: Brother Secondary Emergency Contact: Lelon Huh Address: [address]          [place], [place] [address] [place] Phone: [phone] Mobile Phone: [phone] Relation: Daughter  Code Status: FULL  Allergies: Almond oil  Chief Complaint  Patient presents with  . Medical Management of Chronic Issues    HPI: Patient is 58 y.o. male who is being seen for routine problems.  Past Medical History  Diagnosis Date  . Cardiomyopathy     EF 20-25% by echo 01/2010 with severe LVH felt secondary to substance abuse (no ischemic workup)  . Diabetes mellitus   . HTN (hypertension)   . Polysubstance abuse     Reported hx of cocaine, THC, heroin, EtOH abuse  . Noncompliance   . Peripheral vascular disease   . CHF (congestive heart failure)   . Pneumonia   . Dependent for walking   . Wheelchair bound   . Acute renal failure   . Gangrene of foot   . Streptococcal pneumonia   . 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 Hyperlipidemia   . Anemia   . Urinary retention   . Hyperkalemia     Past Surgical History  Procedure Laterality Date  . Total hip arthroplasty Bilateral   . Amputation  08/15/2011    Procedure: AMPUTATION ABOVE KNEE;  Surgeon: Elam Dutch, MD;  Location: [place];  Service: Vascular;  Laterality: Left;  Amputation End:  3846  . Femoral-tibial bypass graft  12/23/2011    Procedure: BYPASS GRAFT FEMORAL-TIBIAL ARTERY;  Surgeon: Elam Dutch, MD;  Location: [facility] OR;  Service: Vascular;  Laterality: Right;  Right Popliteal - Tibial Artery Bypass using Reversed Saphenous vein      Medication List       This list is accurate as of: 06/05/13  9:03 PM.  Always use your most recent med list.               aspirin 325 MG tablet  Take 325 mg by mouth daily.     bisacodyl 10 MG suppository  Commonly known as:  DULCOLAX  Place 10 mg rectally as needed for moderate constipation.     carvedilol 25 MG tablet  Commonly known as:  COREG  Take 25 mg by mouth daily.     citalopram 20 MG tablet  Commonly known as:  CELEXA  Take 20 mg by mouth daily.     docusate sodium 100 MG capsule  Commonly known as:  COLACE  Take 100 mg by mouth 2 (two) times daily.     ferrous sulfate 325 (65 FE) MG tablet  Take 325 mg by mouth 3 (three) times daily with meals.     folic acid 1 MG tablet  Commonly known as:  FOLVITE  Take 1 mg by mouth daily.     furosemide 20 MG tablet  Commonly known as:  LASIX  Take 20 mg by mouth daily.     LANTUS SOLOSTAR 100 UNIT/ML Solostar  Pen  Generic drug:  Insulin Glargine  Inject 5 Units into the skin every evening.     magnesium hydroxide 400 MG/5ML suspension  Commonly known as:  MILK OF MAGNESIA  Take by mouth daily as needed for mild constipation.     Olopatadine HCl 0.2 % Soln  Place 1 drop into both eyes every evening.     polyethylene glycol packet  Commonly known as:  MIRALAX / GLYCOLAX  Take 17 g by mouth daily.     pravastatin 20 MG tablet  Commonly known as:  PRAVACHOL  Take 20 mg by mouth daily.     tamsulosin 0.4 MG Caps capsule  Commonly known as:  FLOMAX  Take 1 capsule (0.4 mg total) by mouth daily after breakfast.     Vitamin D (Ergocalciferol) 50000 UNITS Caps capsule  Commonly known as:  DRISDOL  Take 50,000 Units by mouth every 30 (thirty) days. 14th of the month        No orders of the defined types were placed in this encounter.    Immunization History   Administered Date(s) Administered  . Influenza Split 10/24/2011  . Influenza-Unspecified 11/09/2012  . PPD Test 07/23/2011  . Pneumococcal Polysaccharide-23 10/24/2011  . Pneumococcal-Unspecified 09/12/2012    History  Substance Use Topics  . Smoking status: Former Smoker -- 15 years    Types: Cigarettes  . Smokeless tobacco: Never Used     Comment: declined  . Alcohol Use: No    Review of Systems  DATA OBTAINED: from patient, limited 2/2 dementia and aphagia GENERAL: no fevers, fatigue, appetite changes SKIN: No itching, rash HEENT: No complaint RESPIRATORY: No cough, wheezing, SOB CARDIAC: No chest pain, palpitations, lower extremity edema  GI: No abdominal pain, No N/V/D or constipation, No heartburn or reflux  GU: No dysuria, frequency or urgency, or incontinence  MUSCULOSKELETAL: No unrelieved bone/joint pain NEUROLOGIC: No headache, dizziness or focal weakness PSYCHIATRIC: No overt anxiety or sadness. Sleeps well.   Filed Vitals:   06/05/13 2043  BP: 109/69  Pulse: 72  Temp: 98.8 F (37.1 C)  Resp: 20    Physical Exam  GENERAL APPEARANCE: Alert, modconversant. Appropriately groomed. No acute distress  SKIN: No diaphoresis rash, or wounds HEENT: Unremarkable RESPIRATORY: Breathing is even, unlabored. Lung sounds are clear   CARDIOVASCULAR: Heart RRR no murmurs, rubs or gallops. No peripheral edema  GASTROINTESTINAL: Abdomen is soft, non-tender, not distended w/ normal bowel sounds.  GENITOURINARY: Bladder non tender, not distended  MUSCULOSKELETAL: L AKA, RUE contracture NEUROLOGIC: R side weakness, aphasia PSYCHIATRIC: Mood and affect appropriate to situation, no behavioral issues  Patient Active Problem List   Diagnosis Date Noted  . Constipation, chronic 06/05/2013  . S/P AKA (above knee amputation) 03/23/2013  . Dementia without behavioral disturbance 03/23/2013  . Depression with anxiety 03/23/2013  . Chronic kidney disease 03/23/2013  .  Hyperlipidemia   . Stroke   . AKI (acute kidney injury) 02/27/2013  . Urinary retention 02/27/2013  . Hyperkalemia 02/27/2013  . Pain in limb-Right Thigh 02/07/2013  . Anemia 06/13/2012  . Peripheral vascular disease, unspecified 11/26/2011  . Preoperative examination, unspecified 11/26/2011  . Acute respiratory failure with hypoxia 10/23/2011  . Dehydration 10/23/2011  . Preop cardiovascular exam 10/21/2011  . Atherosclerosis of native arteries of the extremities with gangrene 09/24/2011  . Aftercare following surgery of the circulatory system, [place] 09/24/2011  . Gangrene of foot 08/14/2011  . PVD (peripheral vascular disease) 08/14/2011  . Metabolic acidosis 32/44/0102  . History of  CVA (cerebrovascular accident) 08/14/2011  . Rhabdomyolysis 08/14/2011  . HTN (hypertension) 08/14/2011  . Hypertensive emergency 02/11/2011  . Diabetes mellitus 02/11/2011  . Chronic combined mod- severe systolic (EF 60-45%) and grade 1 diastolic CHF (congestive heart failure) 02/11/2011  . Aphasia 02/11/2011    CBC    Component Value Date/Time   WBC 4.6 03/02/2013 0344   RBC 4.28 03/02/2013 0344   HGB 10.9* 03/02/2013 0344   HCT 33.7* 03/02/2013 0344   PLT 246 03/02/2013 0344   MCV 78.7 03/02/2013 0344   LYMPHSABS 1.5 02/27/2013 1919   MONOABS 0.7 02/27/2013 1919   EOSABS 0.1 02/27/2013 1919   BASOSABS 0.0 02/27/2013 1919    CMP     Component Value Date/Time   NA 143 03/02/2013 0344   K 4.2 03/02/2013 0344   CL 104 03/02/2013 0344   CO2 28 03/02/2013 0344   GLUCOSE 105* 03/02/2013 0344   BUN 16 03/02/2013 0344   CREATININE 1.05 03/02/2013 0344   CALCIUM 8.3* 03/02/2013 0344   PROT 8.3 12/22/2011 1144   ALBUMIN 3.5 12/22/2011 1144   AST 23 12/22/2011 1144   ALT 33 12/22/2011 1144   ALKPHOS 93 12/22/2011 1144   BILITOT 0.2* 12/22/2011 1144   GFRNONAA 76* 03/02/2013 0344   GFRAA 89* 03/02/2013 0344    Assessment and Plan  Stroke No functional decline;chronic R side weakness and aphasia but  able to transfer and go about in [facility]  Diabetes mellitus Low dose basil,A1c 5.5  Dementia without behavioral disturbance Felt to be vascular and no significant decline  Chronic kidney disease GFR- 88; Cr- 68CL  PVD (peripheral vascular disease) Continues stable. L AKA and R fem-pop. Continues on ASA and statin  Hyperlipidemia Continue pravachol  Constipation, chronic Recently seen by GI;cologuard screening done and miralax added to bowel regimen    Hennie Duos, MD

## 2013-06-05 NOTE — Assessment & Plan Note (Signed)
Continue pravachol.  

## 2013-06-05 NOTE — Assessment & Plan Note (Signed)
Continues stable. L AKA and R fem-pop. Continues on ASA and statin

## 2013-06-05 NOTE — Assessment & Plan Note (Signed)
Recently seen by GI;cologuard screening done and miralax added to bowel regimen

## 2013-06-19 ENCOUNTER — Telehealth: Payer: Self-pay | Admitting: Gastroenterology

## 2013-06-19 NOTE — Telephone Encounter (Signed)
Called [facility] to give cologuard results to nurse, sat on hold for 15 min. Will call back

## 2013-06-20 ENCOUNTER — Telehealth: Payer: Self-pay | Admitting: Gastroenterology

## 2013-06-20 NOTE — Telephone Encounter (Signed)
Spoke to nurse at pt's assisted living. Gave results for Cologuard test coming back negative and per Dr. Hilarie Fredrickson pt to repeat test in 3 years, she requested a faxed copy. I faxed it to her @ [phone]

## 2013-07-19 ENCOUNTER — Encounter: Payer: Self-pay | Admitting: Internal Medicine

## 2013-08-08 ENCOUNTER — Encounter: Payer: Self-pay | Admitting: Family

## 2013-08-09 ENCOUNTER — Other Ambulatory Visit: Payer: Self-pay

## 2013-08-09 ENCOUNTER — Ambulatory Visit (HOSPITAL_COMMUNITY)
Admission: RE | Admit: 2013-08-09 | Discharge: 2013-08-09 | Disposition: A | Discharge status: Home / Self Care | Payer: PRIVATE HEALTH INSURANCE | Source: Ambulatory Visit | Attending: Family | Admitting: Family

## 2013-08-09 ENCOUNTER — Ambulatory Visit (INDEPENDENT_AMBULATORY_CARE_PROVIDER_SITE_OTHER)
Admission: RE | Admit: 2013-08-09 | Discharge: 2013-08-09 | Disposition: A | Discharge status: Home / Self Care | Payer: PRIVATE HEALTH INSURANCE | Source: Ambulatory Visit | Attending: Family | Admitting: Family

## 2013-08-09 ENCOUNTER — Ambulatory Visit (INDEPENDENT_AMBULATORY_CARE_PROVIDER_SITE_OTHER): Payer: PRIVATE HEALTH INSURANCE | Admitting: Family

## 2013-08-09 ENCOUNTER — Encounter: Payer: Self-pay | Admitting: Family

## 2013-08-09 VITALS — BP 148/87 | HR 57 | Resp 14 | Ht 67.0 in | Wt 138.0 lb

## 2013-08-09 DIAGNOSIS — M79671 Pain in right foot: Secondary | ICD-10-CM

## 2013-08-09 DIAGNOSIS — I739 Peripheral vascular disease, unspecified: Secondary | ICD-10-CM

## 2013-08-09 DIAGNOSIS — M79609 Pain in unspecified limb: Secondary | ICD-10-CM

## 2013-08-09 DIAGNOSIS — Z48812 Encounter for surgical aftercare following surgery on the circulatory system: Secondary | ICD-10-CM

## 2013-08-09 NOTE — Progress Notes (Signed)
[facility] [place] HISTORY AND PHYSICAL -PAD  History of Present Illness Jeremy Howell is a 58 y.o. male patient of Dr. Oneida Alar who is s/p above-knee pop to posterior tibial artery bypass in November 2013. He subsequently underwent right superficial femoral artery stenting in January of 2014. He has had previous left above-knee amputation. He currently uses his right leg only for transfer. He currently resides in a skilled nursing facility. He is here today for followup visit. Chronic medical problems include cardiomyopathy with ejection fraction of 20-25%, prior stroke, diabetes, hypertension. These are all currently stable.  He returns today for 6 months follow up of his PAD and  C/O discomfort right foot for several months.   10/14/12 arteriogram by Dr. Oneida Alar:  Indications: Patient is a 58 year old male who previously had a right popliteal to posterior tibial bypass. He also has a right superficial femoral stent above this. Recent outpatient duplex scan suggested narrowing of the right superficial femoral artery above the level of the stent.  This was slightly technically difficult due to the patient's left hip contracture. The left common femoral artery was identified. This was patent. The infrarenal abdominal aorta is patent. The left and right common iliac arteries are patent. The left and right external iliac arteries are patent. The internal iliac arteries are occluded bilaterally. Patent right superficial femoral artery stent and right popliteal to posterior tibial artery bypass. 50% stenosis right common femoral artery.  Operative management: The patient is not a candidate for an open operation due to his severe underlying debility. His 50% stenosis of the right common femoral artery although slightly flow-limiting does not put his bypass at high risk currently. He will need continued observation. The patient has a left hip contracture as well as a right hip and right knee  contracture which makes him a marginal operative candidate.   He has difficulty transferring now, trouble pivoting on right leg. Patient is unable to articulate, sister states since one of his strokes, and sister states pt.does not indicate pain in right thigh at rest. Sister denies that her brother has non-healing wounds.  Sister states that her brother has no problems sleeping, no night pain.  No records from nursing facility, he seems to be receiving insulin,  sister states his blood sugars are being checked.  Last carotid Duplex on file, 2013, indicates no significant extracranial carotid stenosis.  Patient denies New Medical or Surgical History.  Pt Diabetic: Yes, sister states was in good control  Pt smoker: former smoker, quit in 2011  ASA: Yes  Other anticoagulants/antiplatelets: not on past records, no records received from nursing facility   Past Medical History  Diagnosis Date  . Cardiomyopathy     EF 20-25% by echo 01/2010 with severe LVH felt secondary to substance abuse (no ischemic workup)  . Diabetes mellitus   . HTN (hypertension)   . Polysubstance abuse     Reported hx of cocaine, THC, heroin, EtOH abuse  . Noncompliance   . Peripheral vascular disease   . CHF (congestive heart failure)   . Pneumonia   . Dependent for walking   . Wheelchair bound   . Acute renal failure   . Gangrene of foot   . Streptococcal pneumonia   . 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 Hyperlipidemia   . Anemia   . Urinary retention   . Hyperkalemia  Social History History  Substance Use Topics  . Smoking status: Former Smoker -- 15 years    Types: Cigarettes    Quit date: 08/10/2011  . Smokeless tobacco: Never Used     Comment: declined  . Alcohol Use: No    Family History Family History  Problem Relation Age of Onset  . Stroke Brother   . Emphysema Brother   . Stroke Mother   . Heart disease  Mother   . Diabetes Mother   . Hepatitis Mother   . Clotting disorder Mother   . Alzheimer's disease Mother   . Pneumonia Father     Past Surgical History  Procedure Laterality Date  . Total hip arthroplasty Bilateral   . Amputation  08/15/2011    Procedure: AMPUTATION ABOVE KNEE;  Surgeon: Elam Dutch, MD;  Location: [facility];  Service: Vascular;  Laterality: Left;  Amputation End:  3825  . Femoral-tibial bypass graft  12/23/2011    Procedure: BYPASS GRAFT FEMORAL-TIBIAL ARTERY;  Surgeon: Elam Dutch, MD;  Location: [facility] OR;  Service: Vascular;  Laterality: Right;  Right Popliteal - Tibial Artery Bypass using Reversed Saphenous vein    Allergies  Allergen Reactions  . Almond Oil Shortness Of Breath and Swelling    Current Outpatient Prescriptions  Medication Sig Dispense Refill  . aspirin 325 MG tablet Take 325 mg by mouth daily.      . carvedilol (COREG) 25 MG tablet Take 25 mg by mouth daily.       . citalopram (CELEXA) 20 MG tablet Take 20 mg by mouth daily.       Marland Kitchen docusate sodium (COLACE) 100 MG capsule Take 100 mg by mouth 2 (two) times daily.      . ferrous sulfate 325 (65 FE) MG tablet Take 325 mg by mouth 3 (three) times daily with meals.       . folic acid (FOLVITE) 1 MG tablet Take 1 mg by mouth daily.      . furosemide (LASIX) 20 MG tablet Take 20 mg by mouth daily.      Marland Kitchen LANTUS SOLOSTAR 100 UNIT/ML injection Inject 5 Units into the skin every evening.       . magnesium hydroxide (MILK OF MAGNESIA) 400 MG/5ML suspension Take by mouth daily as needed for mild constipation.      . polyethylene glycol (MIRALAX / GLYCOLAX) packet Take 17 g by mouth daily.  30 each  6  . pravastatin (PRAVACHOL) 20 MG tablet Take 20 mg by mouth daily.      . tamsulosin (FLOMAX) 0.4 MG CAPS capsule Take 1 capsule (0.4 mg total) by mouth daily after breakfast.  30 capsule  0  . Vitamin D, Ergocalciferol, (DRISDOL) 50000 UNITS CAPS capsule Take 50,000 Units by mouth every 30 (thirty) days.  14th of the month      . bisacodyl (DULCOLAX) 10 MG suppository Place 10 mg rectally as needed for moderate constipation.      . Olopatadine HCl 0.2 % SOLN Place 1 drop into both eyes every evening.       No current facility-administered medications for this visit.    ROS: See HPI for pertinent positives and negatives.   Physical Examination   Filed Vitals:   08/09/13 1155  BP: 148/87  Pulse: 57  Resp: 14  Height: 5\' 7"  (1.702 m)  Weight: 138 lb (62.596 kg)  SpO2: 98%   Body mass index is 21.61 kg/(m^2).  General: A&O x 3, WDWN,  Gait:  in wheelchair  Eyes: Pupils equal Pulmonary: Respirations are non labored Cardiac: regular Rythm , without murmur  Carotid Bruits  Left  Right    Negative  Negative   Radial pulses: 1+ right, 2+ left  VASCULAR EXAM:  Extremities without ischemic changes  without Gangrene; without open wounds. Left AKA.  LE Pulses  LEFT  RIGHT   POPLITEAL  AKA  not palpable   POSTERIOR TIBIAL  AKA  not palpable   DORSALIS PEDIS  ANTERIOR TIBIAL  AKA  not palpable   Abdomen: soft, NT, no masses.  Skin: no rashes, no ulcers noted.  Musculoskeletal: right side muscle wasting and atrophy, right hip contracture.  Neurologic: A&O X 3; Appropriate Affect ; SENSATION: normal; motor strength : right side weakness. Speech is aphasic/garbled.  Non-Invasive Vascular Imaging: DATE: 08/09/2013 LOWER EXTREMITY ARTERIAL EVALUATION    INDICATION: Peripheral Vascular Disease     PREVIOUS INTERVENTION(S): Right AK popliteal to posterior tibial artery bypass graft 12/23/2011; Left AKA07/07/2011 ; Right SFA stent placed 02/12/2012.    DUPLEX EXAM:     RIGHT  LEFT   Peak Systolic Velocity (cm/s) Ratio (if abnormal) Waveform  Peak Systolic Velocity (cm/s) Ratio (if abnormal) Waveform  34  M Artery - Proximal to Stent     67  M Stent - Origin     80  M Stent - Proximal     50  M Stent - Mid     40  M Stent - Distal     72  M  Stent - End     37/56/58/28  M/M/M  Artery - Distal to Stent     0.77/0.70 Today's ABI / TBI AKA  0.79/0.60 Previous ABI / TBI (02/07/2013) AKA    Waveform:    M - Monophasic       B - Biphasic       T - Triphasic  If Ankle Brachial Index (ABI) or Toe Brachial Index (TBI) performed, please see complete report     ADDITIONAL FINDINGS: Right femoral to popliteal artery bypass graft was not well visualized on today's exam, technically difficult due to edema and small vessel size.     IMPRESSION: 1.Elevated velocities and ratios suggestive of greater than 50% stenosis present involving the right   common femoral, profunda femoral, and proximal superficial femoral arteries.   2. Patent right mid superficial femoral artery stent. 3. No color or spectral Doppler waveform could be obtained involving the right anterior tibial artery at the proximal/mid segment suggestive of occlusion.     ASSESSMENT: Jeremy Howell is a 58 y.o. male who is s/p Right above the knee popliteal to posterior tibial artery bypass graft 12/23/2011; Left AKA 08/15/2011 ; Right SFA stent placed 02/12/2012. He is not able to use his right leg to walk due to the right hip contracture and weakness post CVA. 1.Elevated velocities and ratios suggestive of greater than 50% stenosis present involving the right   common femoral, profunda femoral, and proximal superficial femoral arteries, including a velocity >508 cm/sec.   2. Patent right mid superficial femoral artery stent. 3. No color or spectral Doppler waveform could be obtained involving the right anterior tibial artery at the proximal/mid segment suggestive of occlusion. His right ABI is stable and the right TBI is slightly improved from the study on 02/07/13, monophasic waveforms.   PLAN:  Based on the patient's vascular studies and examination, and after discussing with Dr. Scot Dock, pt will be scheduled for an arteriogram with Dr. Oneida Alar,  soonest available, follow up with Dr. Oneida Alar.  The patient was  given information about PAD including signs, symptoms, treatment, what symptoms should prompt the patient to seek immediate medical care, and risk reduction measures to take.  Clemon Chambers, RN, MSN, FNP-C [facility] Phone: [phone]  Clinic MD: Scot Dock  08/09/2013 12:25 PM

## 2013-08-10 ENCOUNTER — Telehealth: Payer: Self-pay | Admitting: Vascular Surgery

## 2013-08-10 NOTE — Telephone Encounter (Signed)
Pt sched for 07/21 @ 2:15- waiting to hear status of aortogram before contacting pt, dpm

## 2013-08-10 NOTE — Telephone Encounter (Signed)
Message copied by Gena Fray on Thu Aug 10, 2013  3:56 PM ------      Message from: Viann Fish      Created: Thu Aug 10, 2013  9:35 AM      Regarding: RE: another aortogram scheduled       Dr. Oneida Alar,      I have doubts so I will request that the aortogram be cancelled and he be scheduled  To see you in a few weeks.      Gerald Leitz, Kay,[facility],      Please cancel the aortogram and [facility] admin please call pt to schedule him to see Dr. Oneida Alar in a few weeks before the aortogram is considered.      Thank you,      Vinnie Level                  ----- Message -----         From: Elam Dutch, MD         Sent: 08/09/2013   9:31 PM           To: Sharmon Leyden Nickel, NP      Subject: RE: another aortogram scheduled                          How debilitated is he?  If his leg is severely contracted non ambulatory non usable leg for transfer would not chase it.  If you have any doubts just set him up to see me in a few weeks            Juanda Crumble      ----- Message -----         From: Viann Fish, NP         Sent: 08/09/2013   7:43 PM           To: Elam Dutch, MD      Subject: another aortogram scheduled                              Dr. Oneida Alar,            This pt had an aortogram last Sept.      He has elevated velocities and ratios suggestive of greater than 50% stenosis present involving the right         common femoral, profunda femoral, and proximal superficial femoral arteries, including a velocity >508 cm/sec.       Just letting you know about this in case you decide that he does not need another aortogram.      He has right hemiparesis, right hip contracture, left AKA, is in a SNF.            Thank you, Jeremy Howell is a 58 y.o. male who is s/p Right above the knee popliteal to posterior tibial artery bypass graft 12/23/2011; Left AKA 08/15/2011 ; Right SFA stent placed 02/12/2012.      He is not able to use his right  leg to walk due to the right hip contracture and weakness post CVA.      1.Elevated velocities and ratios suggestive of greater than 50% stenosis present involving  the right         common femoral, profunda femoral, and proximal superficial femoral arteries, including a velocity >508 cm/sec.         2. Patent right mid superficial femoral artery stent.      3. No color or spectral Doppler waveform could be obtained involving the right anterior tibial artery at the proximal/mid segment suggestive of occlusion.      His right ABI is stable and the right TBI is slightly improved from the study on 02/07/13, monophasic waveforms.                   PLAN:                  Based on the patient's vascular studies and examination, and after discussing with Dr. Scot Dock, pt will be scheduled for an arteriogram with Dr. Oneida Alar, soonest available, follow up with Dr. Oneida Alar.                   ------

## 2013-08-28 ENCOUNTER — Encounter: Payer: Self-pay | Admitting: Vascular Surgery

## 2013-08-28 NOTE — Telephone Encounter (Signed)
Spoke with Hilda Blades at [facility], he will be here for his appointment with [facility] on 08/29/13 as previously scheduled, dpm

## 2013-08-29 ENCOUNTER — Encounter: Payer: Self-pay | Admitting: Vascular Surgery

## 2013-08-29 ENCOUNTER — Ambulatory Visit (INDEPENDENT_AMBULATORY_CARE_PROVIDER_SITE_OTHER): Payer: PRIVATE HEALTH INSURANCE | Admitting: Vascular Surgery

## 2013-08-29 VITALS — BP 137/81 | HR 65 | Ht 67.0 in | Wt 138.0 lb

## 2013-08-29 DIAGNOSIS — I739 Peripheral vascular disease, unspecified: Secondary | ICD-10-CM

## 2013-08-29 NOTE — Progress Notes (Signed)
Patient is a 58 year old male who presents for asymptomatic increasing velocities of the right common femoral artery. The patient is overall very debilitated. He has had a prior left above-knee amputation. He has had a prior stroke which left him with a right hemiplegia. He is unable to bear weight and pivot on the right leg. He has a permanent 90 right knee contracture. He also has some flexion contracture of his right hip. He has no pain in the right foot. He has no ulcerations on the foot. He was previously seen by her nurse practitioner and consideration was given for arteriogram. His most recent ABI was 0.8.  Review of systems: He has some element of aphasia secondary to stroke. He is non-ambulatory. He is full assist on transfer.   Physical exam:  Filed Vitals:   08/29/13 1457  BP: 137/81  Pulse: 65  Height: 5\' 7"  (1.702 m)  Weight: 138 lb (62.596 kg)  SpO2: 97%    Right lower extremity: 10-15 flexion contracture right hip 90 flexion contracture right knee, well-healed left above-knee amputation Skin: No ulcerations no edema  Assessment: Peripheral arterial disease right lower extremity. Currently asymptomatic. ABI is 0.8. Patient is not a candidate for percutaneous or open revascularization due to his overall debility.  Plan: Followup as needed if wounds develop in the right foot or develops rest pain. At that point would consider above-knee amputation  Ruta Hinds, MD [facility] Office: [phone] Pager: [phone]

## 2013-09-08 ENCOUNTER — Ambulatory Visit (HOSPITAL_COMMUNITY): Admission: RE | Admit: 2013-09-08 | Payer: Medicaid Other | Source: Ambulatory Visit | Admitting: Vascular Surgery

## 2013-09-08 ENCOUNTER — Encounter (HOSPITAL_COMMUNITY): Admission: RE | Payer: Self-pay | Source: Ambulatory Visit

## 2013-09-08 SURGERY — ABDOMINAL AORTAGRAM
Anesthesia: LOCAL

## 2013-10-19 ENCOUNTER — Encounter (HOSPITAL_COMMUNITY): Payer: Medicare Other

## 2013-10-19 ENCOUNTER — Ambulatory Visit: Payer: Medicare Other | Admitting: Vascular Surgery

## 2013-10-19 ENCOUNTER — Other Ambulatory Visit (HOSPITAL_COMMUNITY): Payer: Medicare Other

## 2013-10-19 ENCOUNTER — Ambulatory Visit: Payer: Medicare Other | Admitting: Family

## 2013-11-06 ENCOUNTER — Encounter: Payer: Self-pay | Admitting: Internal Medicine

## 2013-11-06 ENCOUNTER — Non-Acute Institutional Stay (SKILLED_NURSING_FACILITY): Payer: PRIVATE HEALTH INSURANCE | Admitting: Internal Medicine

## 2013-11-06 DIAGNOSIS — F339 Major depressive disorder, recurrent, unspecified: Secondary | ICD-10-CM

## 2013-11-06 DIAGNOSIS — I509 Heart failure, unspecified: Secondary | ICD-10-CM

## 2013-11-06 DIAGNOSIS — E118 Type 2 diabetes mellitus with unspecified complications: Secondary | ICD-10-CM

## 2013-11-06 DIAGNOSIS — N182 Chronic kidney disease, stage 2 (mild): Secondary | ICD-10-CM

## 2013-11-06 DIAGNOSIS — I739 Peripheral vascular disease, unspecified: Secondary | ICD-10-CM

## 2013-11-06 DIAGNOSIS — E785 Hyperlipidemia, unspecified: Secondary | ICD-10-CM

## 2013-11-06 DIAGNOSIS — I10 Essential (primary) hypertension: Secondary | ICD-10-CM

## 2013-11-06 DIAGNOSIS — I5042 Chronic combined systolic (congestive) and diastolic (congestive) heart failure: Secondary | ICD-10-CM

## 2013-11-06 NOTE — Assessment & Plan Note (Signed)
A1c 5.5 in 04/2013; evening lantus d/c and will f/u repeat in 3 months

## 2013-11-06 NOTE — Assessment & Plan Note (Signed)
Stable;s/p L AKA and R fem-pop

## 2013-11-06 NOTE — Assessment & Plan Note (Signed)
BBlocker , lasix, ASA and statin;ACE being held but with improvement may be able to restart; pt with no sx

## 2013-11-06 NOTE — Assessment & Plan Note (Signed)
Improved 08/2013 with GFR 93 and CrCl -78

## 2013-11-06 NOTE — Progress Notes (Signed)
MRN: [id number] Name: Jeremy Howell  Sex: male Age: 58 y.o. DOB: 12-03-1955  [facility] #: [facility] Facility/Room: 121A Level Of Care: SNF Provider: Inocencio Homes D Emergency Contacts: Extended Emergency Contact Information Primary Emergency Contact: [facility] Address: [address]          [place], [place] [address] Johnnette Litter of [place] Phone: [phone] Work Phone: [phone] Mobile Phone: [phone] Relation: Brother Secondary Emergency Contact: Lelon Huh Address: [address]          [place],  [address] [place] Phone: [phone] Mobile Phone: [phone] Relation: Daughter  Code Status: FULL  Allergies: Almond oil  Chief Complaint  Patient presents with  . Medical Management of Chronic Issues    HPI: Patient is 58 y.o. male who is being seen for routine problems.  Past Medical History  Diagnosis Date  . Cardiomyopathy     EF 20-25% by echo 01/2010 with severe LVH felt secondary to substance abuse (no ischemic workup)  . Diabetes mellitus   . HTN (hypertension)   . Polysubstance abuse     Reported hx of cocaine, THC, heroin, EtOH abuse  . Noncompliance   . Peripheral vascular disease   . CHF (congestive heart failure)   . Pneumonia   . Dependent for walking   . Wheelchair bound   . Acute renal failure   . Gangrene of foot   . Streptococcal pneumonia   . 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 Hyperlipidemia   . Anemia   . Urinary retention   . Hyperkalemia     Past Surgical History  Procedure Laterality Date  . Total hip arthroplasty Bilateral   . Amputation  08/15/2011    Procedure: AMPUTATION ABOVE KNEE;  Surgeon: Elam Dutch, MD;  Location: [facility];  Service: Vascular;  Laterality: Left;  Amputation End:  2423  . Femoral-tibial bypass graft  12/23/2011    Procedure: BYPASS GRAFT FEMORAL-TIBIAL ARTERY;  Surgeon: Elam Dutch, MD;  Location: [facility] OR;  Service: Vascular;  Laterality: Right;  Right Popliteal - Tibial Artery Bypass using Reversed Saphenous vein      Medication List       This list is accurate as of: 11/06/13  8:32 PM.  Always use your most recent med list.               aspirin 325 MG tablet  Take 325 mg by mouth daily.     bisacodyl 10 MG suppository  Commonly known as:  DULCOLAX  Place 10 mg rectally as needed for moderate constipation.     carvedilol 25 MG tablet  Commonly known as:  COREG  Take 25 mg by mouth daily.     citalopram 20 MG tablet  Commonly known as:  CELEXA  Take 20 mg by mouth daily.     docusate sodium 100 MG capsule  Commonly known as:  COLACE  Take 100 mg by mouth 2 (two) times daily.     ferrous sulfate 325 (65 FE) MG tablet  Take 325 mg by mouth 3 (three) times daily with meals.     folic acid 1 MG tablet  Commonly known as:  FOLVITE  Take 1 mg by mouth daily.     furosemide 20 MG tablet  Commonly known as:  LASIX  Take 20 mg by mouth daily.     LANTUS SOLOSTAR 100 UNIT/ML Solostar  Pen  Generic drug:  Insulin Glargine  Inject 5 Units into the skin every evening.     magnesium hydroxide 400 MG/5ML suspension  Commonly known as:  MILK OF MAGNESIA  Take by mouth daily as needed for mild constipation.     Olopatadine HCl 0.2 % Soln  Place 1 drop into both eyes every evening.     polyethylene glycol packet  Commonly known as:  MIRALAX / GLYCOLAX  Take 17 g by mouth daily.     pravastatin 20 MG tablet  Commonly known as:  PRAVACHOL  Take 20 mg by mouth daily.     tamsulosin 0.4 MG Caps capsule  Commonly known as:  FLOMAX  Take 1 capsule (0.4 mg total) by mouth daily after breakfast.     Vitamin D (Ergocalciferol) 50000 UNITS Caps capsule  Commonly known as:  DRISDOL  Take 50,000 Units by mouth every 30 (thirty) days. 14th of the month        No orders of the defined types were placed in this encounter.    Immunization History   Administered Date(s) Administered  . Influenza Split 10/24/2011  . Influenza-Unspecified 11/09/2012  . PPD Test 07/23/2011  . Pneumococcal Polysaccharide-23 10/24/2011  . Pneumococcal-Unspecified 09/12/2012    History  Substance Use Topics  . Smoking status: Former Smoker -- 15 years    Types: Cigarettes    Quit date: 08/10/2011  . Smokeless tobacco: Never Used     Comment: declined  . Alcohol Use: No    Review of Systems  DATA OBTAINED: from patient GENERAL:  no fevers, fatigue, appetite changes SKIN: No itching, rash HEENT: No complaint RESPIRATORY: No cough, wheezing, SOB CARDIAC: No chest pain, palpitations, lower extremity edema  GI: No abdominal pain, No N/V/D or constipation, No heartburn or reflux  GU: No dysuria, frequency or urgency, or incontinence  MUSCULOSKELETAL: No unrelieved bone/joint pain NEUROLOGIC: No headache, dizziness  PSYCHIATRIC: No overt anxiety or sadness  Filed Vitals:   11/06/13 1954  BP: 132/78  Pulse: 88  Temp: 99.5 F (37.5 C)  Resp: 21    Physical Exam  GENERAL APPEARANCE: Alert, conversant, No acute distress  SKIN: No diaphoresis rash, or wounds HEENT: Unremarkable RESPIRATORY: Breathing is even, unlabored. Lung sounds are clear   CARDIOVASCULAR: Heart RRR no murmurs, rubs or gallops. No peripheral edema  GASTROINTESTINAL: Abdomen is soft, non-tender, not distended w/ normal bowel sounds.  GENITOURINARY: Bladder non tender, not distended  MUSCULOSKELETAL: RUE weakness with contractures, L AKA NEUROLOGIC: Cranial nerves 2-12 grossly intact PSYCHIATRIC: Mood and affect appropriate to situation, no behavioral issues  Patient Active Problem List   Diagnosis Date Noted  . Major depression, recurrent, chronic 11/06/2013  . Constipation, chronic 06/05/2013  . S/P AKA (above knee amputation) 03/23/2013  . Dementia without behavioral disturbance 03/23/2013  . Depression with anxiety 03/23/2013  . CKD (chronic kidney disease)  stage 2, GFR 60-89 ml/min 03/23/2013  . Hyperlipidemia   . Stroke   . AKI (acute kidney injury) 02/27/2013  . Urinary retention 02/27/2013  . Hyperkalemia 02/27/2013  . Pain in limb-Right Thigh 02/07/2013  . Anemia 06/13/2012  . Peripheral vascular disease, unspecified 11/26/2011  . Preoperative examination, unspecified 11/26/2011  . Acute respiratory failure with hypoxia 10/23/2011  . Dehydration 10/23/2011  . Preop cardiovascular exam 10/21/2011  . Atherosclerosis of native arteries of the extremities with gangrene 09/24/2011  . Aftercare following surgery of the circulatory system, [place] 09/24/2011  . Gangrene of foot 08/14/2011  . PVD (peripheral vascular  disease) 08/14/2011  . Metabolic acidosis 26/37/8588  . History of CVA (cerebrovascular accident) 08/14/2011  . Rhabdomyolysis 08/14/2011  . HTN (hypertension) 08/14/2011  . Hypertensive emergency 02/11/2011  . Diabetes mellitus type 2, controlled, with complications 50/27/7412  . Chronic combined mod- severe systolic (EF 87-86%) and grade 1 diastolic CHF (congestive heart failure) 02/11/2011  . Aphasia 02/11/2011    CBC    Component Value Date/Time   WBC 4.6 03/02/2013 0344   RBC 4.28 03/02/2013 0344   HGB 10.9* 03/02/2013 0344   HCT 33.7* 03/02/2013 0344   PLT 246 03/02/2013 0344   MCV 78.7 03/02/2013 0344   LYMPHSABS 1.5 02/27/2013 1919   MONOABS 0.7 02/27/2013 1919   EOSABS 0.1 02/27/2013 1919   BASOSABS 0.0 02/27/2013 1919    CMP     Component Value Date/Time   NA 143 03/02/2013 0344   K 4.2 03/02/2013 0344   CL 104 03/02/2013 0344   CO2 28 03/02/2013 0344   GLUCOSE 105* 03/02/2013 0344   BUN 16 03/02/2013 0344   CREATININE 1.05 03/02/2013 0344   CALCIUM 8.3* 03/02/2013 0344   PROT 8.3 12/22/2011 1144   ALBUMIN 3.5 12/22/2011 1144   AST 23 12/22/2011 1144   ALT 33 12/22/2011 1144   ALKPHOS 93 12/22/2011 1144   BILITOT 0.2* 12/22/2011 1144   GFRNONAA 76* 03/02/2013 0344   GFRAA 89* 03/02/2013 0344    Assessment and  Plan  HTN (hypertension) Controlled on coreg and lasix   Major depression, recurrent, chronic Doing well on Celexa 10 mg after coming down from 20 mg  Diabetes mellitus type 2, controlled, with complications V6H 5.5 in 04/2013; evening lantus d/c and will f/u repeat in 3 months  CKD (chronic kidney disease) stage 2, GFR 60-89 ml/min Improved 08/2013 with GFR 93 and CrCl -78  Chronic combined mod- severe systolic (EF 20-94%) and grade 1 diastolic CHF (congestive heart failure) BBlocker , lasix, ASA and statin;ACE being held but with improvement may be able to restart; pt with no sx  PVD (peripheral vascular disease) Stable;s/p L AKA and R fem-pop  Hyperlipidemia Pravachol d/c and lipitor 20 mg started; shortly after 09/2013 LDL 165, HDL 36; plan repeat 3 months and adjust as needed    Hennie Duos, MD

## 2013-11-06 NOTE — Assessment & Plan Note (Signed)
Controlled on coreg and lasix

## 2013-11-06 NOTE — Assessment & Plan Note (Signed)
Pravachol d/c and lipitor 20 mg started; shortly after 09/2013 LDL 165, HDL 36; plan repeat 3 months and adjust as needed

## 2013-11-06 NOTE — Assessment & Plan Note (Signed)
Doing well on Celexa 10 mg after coming down from 20 mg

## 2013-12-03 IMAGING — CT CT HEAD W/O CM
2 series · 16 of 30 positions shown, 20 images · non-contrast
Comparison: 02/11/2011

CLINICAL DATA: Altered mental status, hypertension.

CT HEAD WITHOUT CONTRAST
TECHNIQUE: Contiguous axial images were obtained from the base of
the skull through the vertex without contrast.

[Series 2: head w/o · axial · non-contrast · 0.46mm/px · z∈[+1204,+1339]mm · 13 of 33 slices shown, 17 images]
[im 3/33  brain]
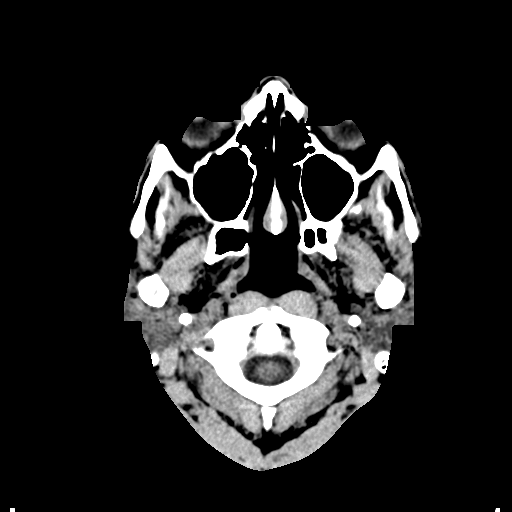
[im 3/33  bone]
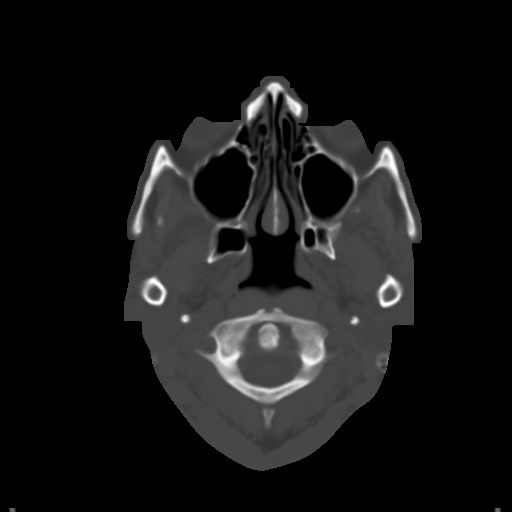
[im 5/33  brain]
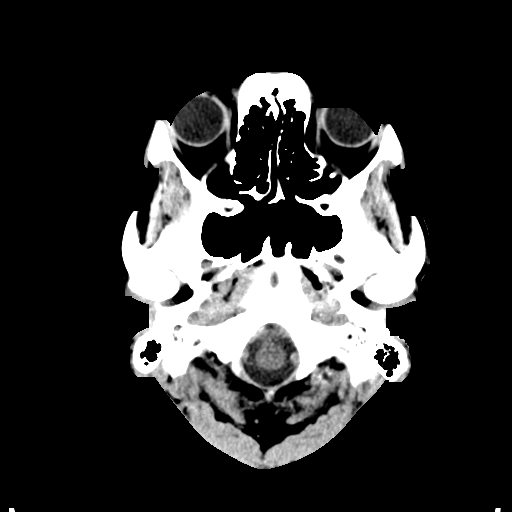
[im 7/33  brain]
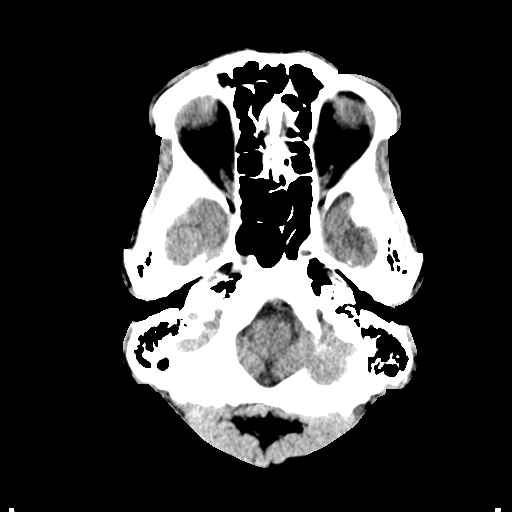
[im 10/33  brain]
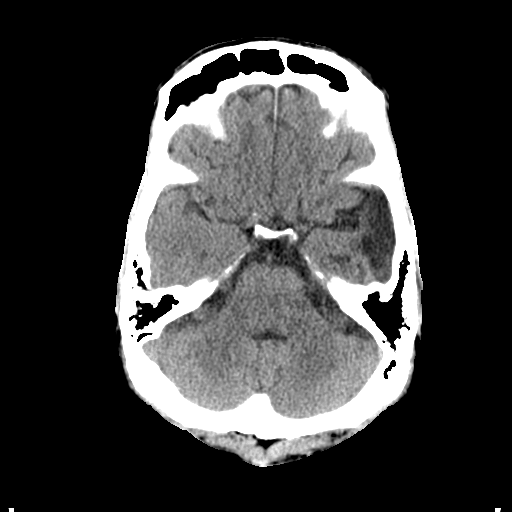
[im 12/33  brain]
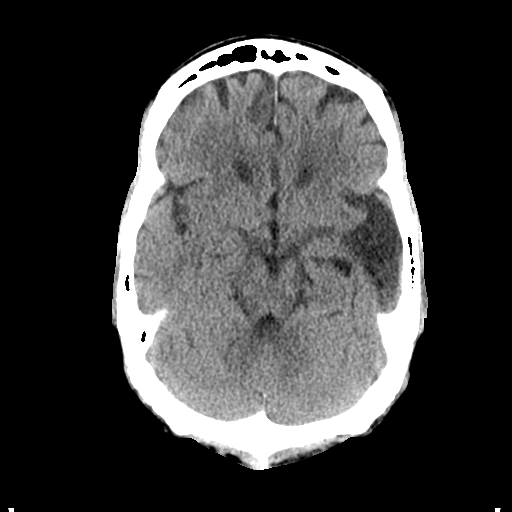
[im 12/33  bone]
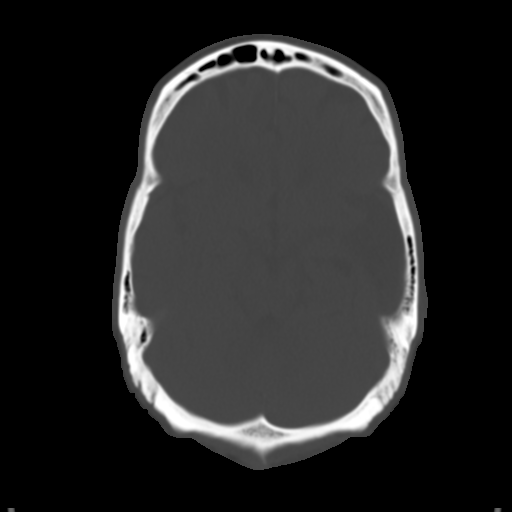
[im 14/33  brain]
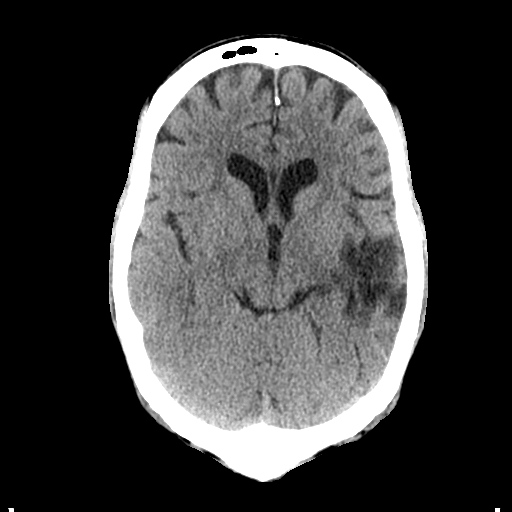
[im 17/33  brain]
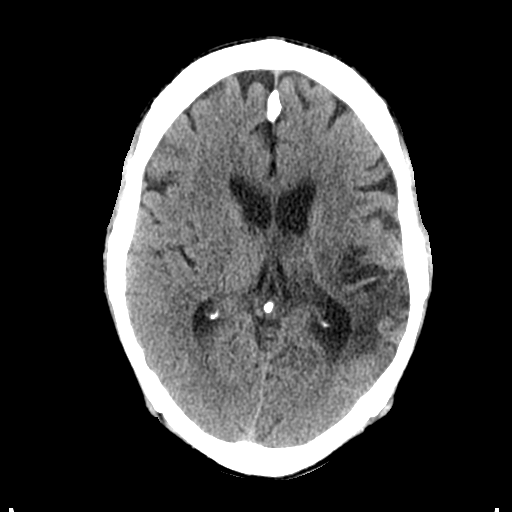
[im 19/33  brain]
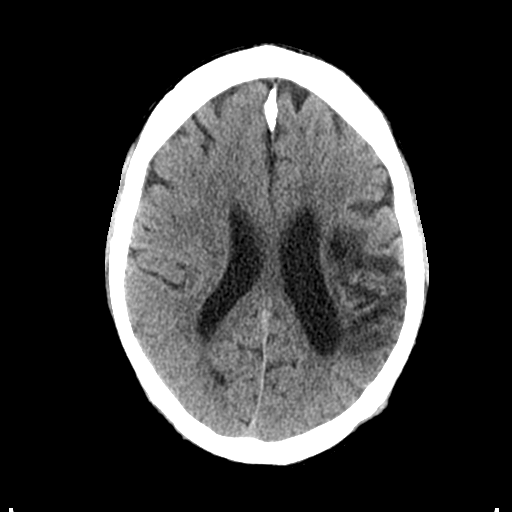
[im 21/33  brain]
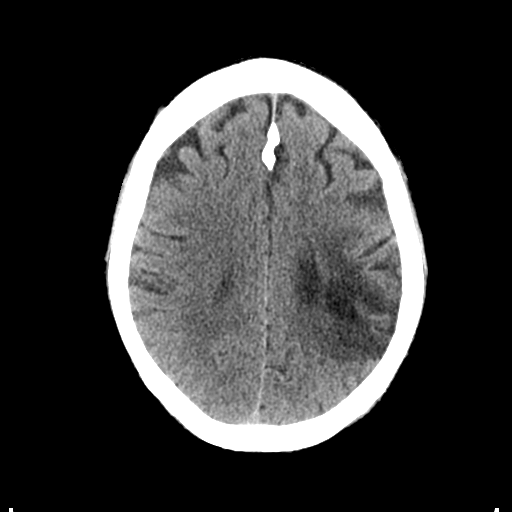
[im 21/33  bone]
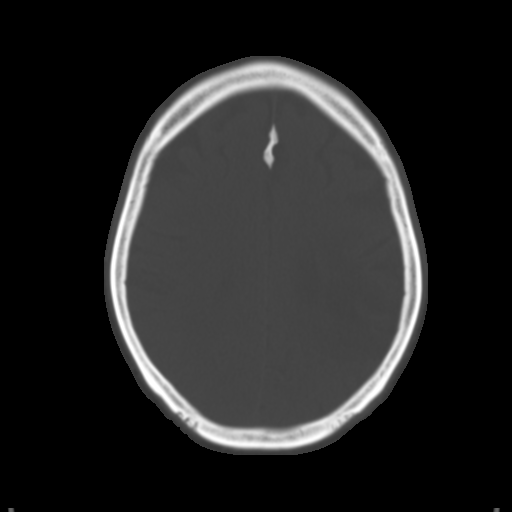
[im 23/33  brain]
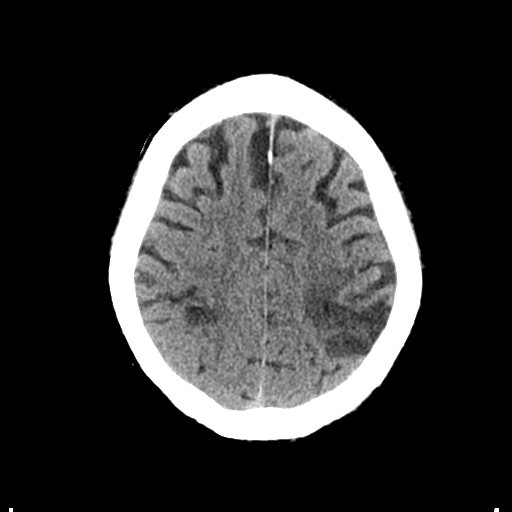
[im 26/33  brain]
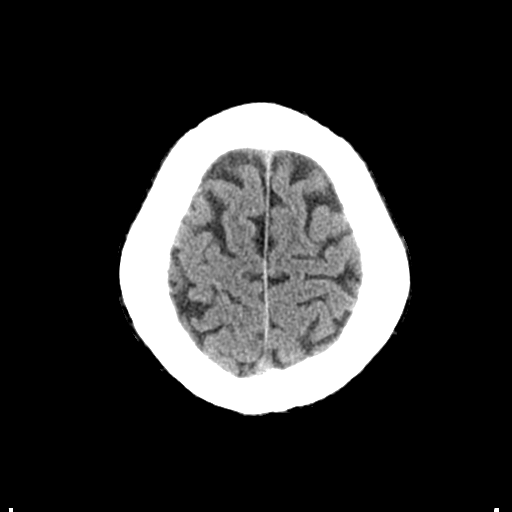
[im 28/33  brain]
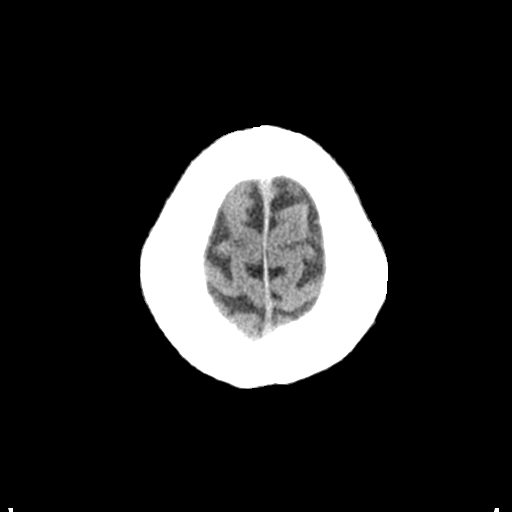
[im 30/33  brain]
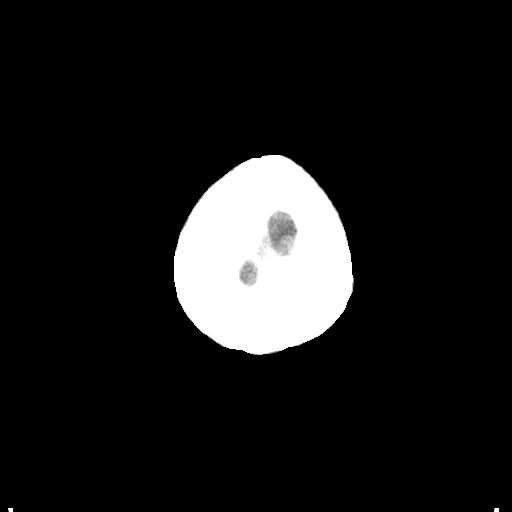
[im 30/33  bone]
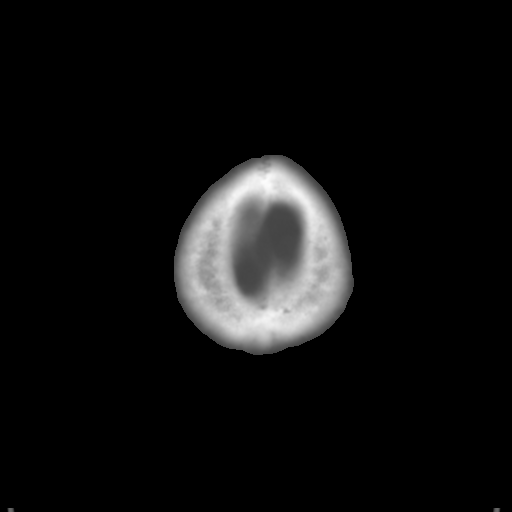

[Series 3: bone windows · axial · 0.46mm/px · z∈[+1204,+1249]mm · 3 of 33 slices shown]
[im 3/33  bone]
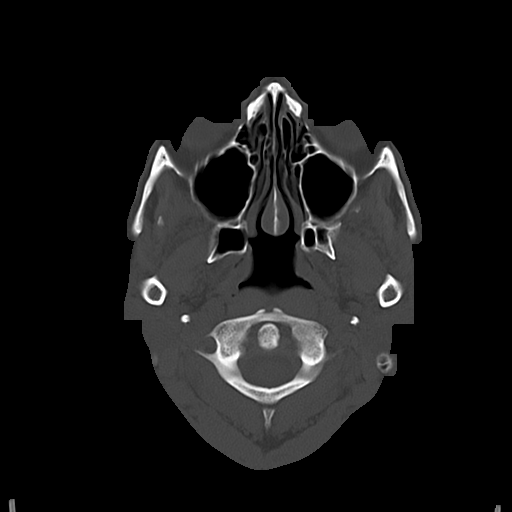
[im 7/33  bone]
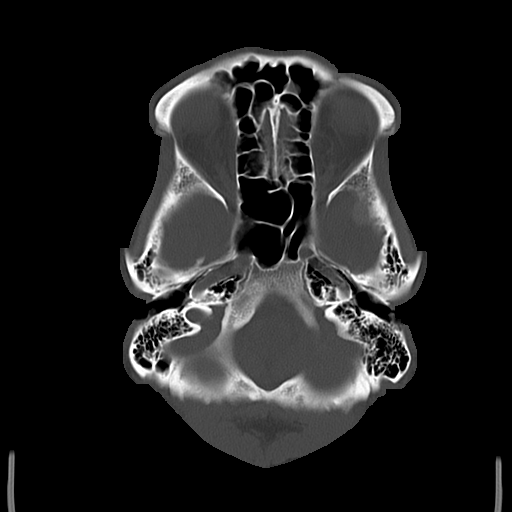
[im 12/33  bone]
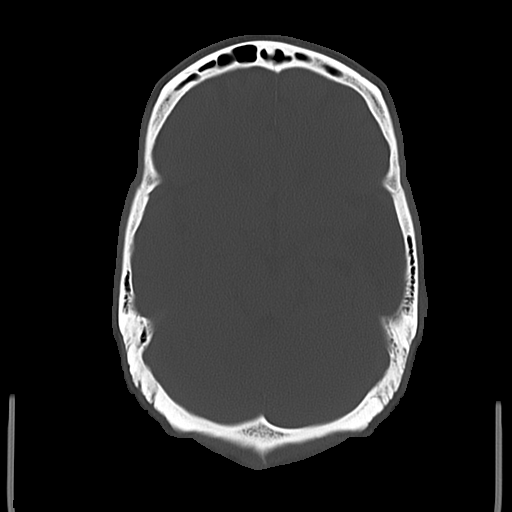

[16 of 30 positions shown; findings below may reference images not displayed]

FINDINGS: Left frontal temporal encephalomalacia.  Focal right
parietal lobe hypoattenuation is unchanged and may reflect remote
infarct. Periventricular and subcortical white matter hypodensities
are most in keeping with chronic microangiopathic change. There is
no evidence for acute hemorrhage, hydrocephalus, mass lesion, or
abnormal extra-axial fluid collection.  No definite CT evidence for
acute infarction.  The visualized paranasal sinuses and mastoid air
cells are predominately clear.
IMPRESSION: Remote infarctions and white matter changes.  No definite acute
intracranial abnormality.

## 2014-01-01 ENCOUNTER — Non-Acute Institutional Stay (SKILLED_NURSING_FACILITY): Payer: PRIVATE HEALTH INSURANCE | Admitting: Internal Medicine

## 2014-01-01 DIAGNOSIS — N182 Chronic kidney disease, stage 2 (mild): Secondary | ICD-10-CM

## 2014-01-01 DIAGNOSIS — E118 Type 2 diabetes mellitus with unspecified complications: Secondary | ICD-10-CM

## 2014-01-01 DIAGNOSIS — E785 Hyperlipidemia, unspecified: Secondary | ICD-10-CM

## 2014-01-01 DIAGNOSIS — R4701 Aphasia: Secondary | ICD-10-CM

## 2014-01-01 DIAGNOSIS — D529 Folate deficiency anemia, unspecified: Secondary | ICD-10-CM

## 2014-01-01 DIAGNOSIS — I10 Essential (primary) hypertension: Secondary | ICD-10-CM

## 2014-01-01 DIAGNOSIS — Z89612 Acquired absence of left leg above knee: Secondary | ICD-10-CM

## 2014-01-01 DIAGNOSIS — I5042 Chronic combined systolic (congestive) and diastolic (congestive) heart failure: Secondary | ICD-10-CM

## 2014-01-01 DIAGNOSIS — I6930 Unspecified sequelae of cerebral infarction: Secondary | ICD-10-CM

## 2014-01-01 NOTE — Progress Notes (Signed)
MRN: [id number] Name: Jeremy Howell  Sex: male Age: 58 y.o. DOB: 01-14-1956  [place] #: Helene Kelp Facility/Room: 121B Level Of Care: SNF Provider: Inocencio Homes D Emergency Contacts: Extended Emergency Contact Information Primary Emergency Contact: [facility] Address: [address]          [place], [place] [address] Johnnette Litter of [place] Phone: [phone] Work Phone: [phone] Mobile Phone: [phone] Relation: Brother Secondary Emergency Contact: Lelon Huh Address: [address]          [place], [place] [address] [place] of [place] Phone: [phone] Mobile Phone: [phone] Relation: Daughter  Code Status: FULL  Allergies: Almond oil  Chief Complaint  Patient presents with  . Medical Management of Chronic Issues    HPI: Patient is 58 y.o. male who is being seen for routine issues.  Past Medical History  Diagnosis Date  . Cardiomyopathy     EF 20-25% by echo 01/2010 with severe LVH felt secondary to substance abuse (no ischemic workup)  . Diabetes mellitus   . HTN (hypertension)   . Polysubstance abuse     Reported hx of cocaine, THC, heroin, EtOH abuse  . Noncompliance   . Peripheral vascular disease   . CHF (congestive heart failure)   . Pneumonia   . Dependent for walking   . Wheelchair bound   . Acute renal failure   . Gangrene of foot   . Streptococcal pneumonia   . 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 Hyperlipidemia   . Anemia   . Urinary retention   . Hyperkalemia     Past Surgical History  Procedure Laterality Date  . Total hip arthroplasty Bilateral   . Amputation  08/15/2011    Procedure: AMPUTATION ABOVE KNEE;  Surgeon: Elam Dutch, MD;  Location: [place];  Service: Vascular;  Laterality: Left;  Amputation End:  0867  . Femoral-tibial bypass graft  12/23/2011    Procedure: BYPASS GRAFT FEMORAL-TIBIAL ARTERY;  Surgeon: Elam Dutch, MD;  Location: [facility] OR;  Service: Vascular;  Laterality: Right;  Right Popliteal - Tibial Artery Bypass using Reversed Saphenous vein      Medication List       This list is accurate as of: 01/01/14 11:59 PM.  Always use your most recent med list.               aspirin 325 MG tablet  Take 325 mg by mouth daily.     bisacodyl 10 MG suppository  Commonly known as:  DULCOLAX  Place 10 mg rectally as needed for moderate constipation.     carvedilol 25 MG tablet  Commonly known as:  COREG  Take 25 mg by mouth daily.     citalopram 20 MG tablet  Commonly known as:  CELEXA  Take 20 mg by mouth daily.     docusate sodium 100 MG capsule  Commonly known as:  COLACE  Take 100 mg by mouth 2 (two) times daily.     ferrous sulfate 325 (65 FE) MG tablet  Take 325 mg by mouth 3 (three) times daily with meals.     folic acid 1 MG tablet  Commonly known as:  FOLVITE  Take 1 mg by mouth daily.     furosemide 20 MG tablet  Commonly known as:  LASIX  Take 20 mg by mouth daily.     LANTUS SOLOSTAR 100 UNIT/ML Solostar Pen  Generic drug:  Insulin Glargine  Inject 5 Units into the skin every evening.     magnesium hydroxide 400 MG/5ML suspension  Commonly known as:  MILK OF MAGNESIA  Take by mouth daily as needed for mild constipation.     Olopatadine HCl 0.2 % Soln  Place 1 drop into both eyes every evening.     polyethylene glycol packet  Commonly known as:  MIRALAX / GLYCOLAX  Take 17 g by mouth daily.     pravastatin 20 MG tablet  Commonly known as:  PRAVACHOL  Take 20 mg by mouth daily.     tamsulosin 0.4 MG Caps capsule  Commonly known as:  FLOMAX  Take 1 capsule (0.4 mg total) by mouth daily after breakfast.     Vitamin D (Ergocalciferol) 50000 UNITS Caps capsule  Commonly known as:  DRISDOL  Take 50,000 Units by mouth every 30 (thirty) days. 14th of the month        No orders of the defined types were placed in this encounter.    Immunization History   Administered Date(s) Administered  . Influenza Split 10/24/2011  . Influenza-Unspecified 11/09/2012  . PPD Test 07/23/2011  . Pneumococcal Polysaccharide-23 10/24/2011  . Pneumococcal-Unspecified 09/12/2012    History  Substance Use Topics  . Smoking status: Former Smoker -- 15 years    Types: Cigarettes    Quit date: 08/10/2011  . Smokeless tobacco: Never Used     Comment: declined  . Alcohol Use: No    Review of Systems  DATA OBTAINED: from patient GENERAL:  no fevers, fatigue, appetite changes SKIN: No itching, rash HEENT: No complaint RESPIRATORY: No cough, wheezing, SOB CARDIAC: No chest pain, palpitations, lower extremity edema  GI: No abdominal pain, No N/V/D or constipation, No heartburn or reflux  GU: No dysuria, frequency or urgency, or incontinence  MUSCULOSKELETAL: No unrelieved bone/joint pain NEUROLOGIC: No headache, dizziness  PSYCHIATRIC: No overt anxiety or sadness  Filed Vitals:   01/01/14 1547  BP: 135/84  Pulse: 67  Temp: 97.1 F (36.2 C)  Resp: 18    Physical Exam  GENERAL APPEARANCE: Alert, nonconversant, No acute distress  SKIN: No diaphoresis rash, or wounds HEENT: Unremarkable RESPIRATORY: Breathing is even, unlabored. Lung sounds are clear   CARDIOVASCULAR: Heart RRR no murmurs, rubs or gallops. No peripheral edema  GASTROINTESTINAL: Abdomen is soft, non-tender, not distended w/ normal bowel sounds.  GENITOURINARY: Bladder non tender, not distended  MUSCULOSKELETAL: contractures NEUROLOGIC: Cranial nerves 2-12 grossly intact, R side weakness PSYCHIATRIC: Mood and affect appropriate to situation, no behavioral issues  Patient Active Problem List   Diagnosis Date Noted  . H/O: stroke with residual effects 01/04/2014  . Major depression, recurrent, chronic 11/06/2013  . Constipation, chronic 06/05/2013  . S/P AKA (above knee amputation) 03/23/2013  . Dementia without behavioral disturbance 03/23/2013  . Depression with anxiety  03/23/2013  . CKD (chronic kidney disease) stage 2, GFR 60-89 ml/min 03/23/2013  . Hyperlipidemia   . Stroke   . AKI (acute kidney injury) 02/27/2013  . Urinary retention 02/27/2013  . Hyperkalemia 02/27/2013  . Pain in limb-Right Thigh 02/07/2013  . 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 Peripheral vascular disease, unspecified 11/26/2011  . Preoperative examination, unspecified 11/26/2011  . Acute respiratory failure with hypoxia 10/23/2011  . Dehydration 10/23/2011  . Preop cardiovascular exam 10/21/2011  . Atherosclerosis of native arteries of the extremities with gangrene 09/24/2011  . Aftercare following surgery of the circulatory system, [place] 09/24/2011  . Gangrene  of foot 08/14/2011  . PVD (peripheral vascular disease) 08/14/2011  . Metabolic acidosis 36/62/9476  . History of CVA (cerebrovascular accident) 08/14/2011  . Rhabdomyolysis 08/14/2011  . HTN (hypertension) 08/14/2011  . Hypertensive emergency 02/11/2011  . Diabetes mellitus type 2, controlled, with complications 54/65/0354  . Chronic combined mod- severe systolic (EF 65-68%) and grade 1 diastolic CHF (congestive heart failure) 02/11/2011  . Aphasia 02/11/2011    CBC    Component Value Date/Time   WBC 4.6 03/02/2013 0344   RBC 4.28 03/02/2013 0344   HGB 10.9* 03/02/2013 0344   HCT 33.7* 03/02/2013 0344   PLT 246 03/02/2013 0344   MCV 78.7 03/02/2013 0344   LYMPHSABS 1.5 02/27/2013 1919   MONOABS 0.7 02/27/2013 1919   EOSABS 0.1 02/27/2013 1919   BASOSABS 0.0 02/27/2013 1919    CMP     Component Value Date/Time   NA 143 03/02/2013 0344   K 4.2 03/02/2013 0344   CL 104 03/02/2013 0344   CO2 28 03/02/2013 0344   GLUCOSE 105* 03/02/2013 0344   BUN 16 03/02/2013 0344   CREATININE 1.05 03/02/2013 0344   CALCIUM 8.3* 03/02/2013 0344   PROT 8.3 12/22/2011 1144   ALBUMIN 3.5 12/22/2011 1144   AST 23 12/22/2011 1144   ALT 33 12/22/2011 1144   ALKPHOS 93 12/22/2011 1144   BILITOT 0.2* 12/22/2011  1144   GFRNONAA 76* 03/02/2013 0344   GFRAA 89* 03/02/2013 0344    Assessment and Plan  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, chronic and stable without decline;speaks with one word answers and hand gestures; ASA as prophylaxis  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 Chronic and stable with 13.3/39.3 and folate > 20 so plan d/c folate  H/O: stroke with residual effects With R 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;ASA daily for prophylaxis  HTN (hypertension) Stable on coreg and lasix  Chronic combined mod- severe systolic (EF 12-75%) and grade 1 diastolic CHF (congestive heart failure) Chronic and stable on coreg and lasix with no recent exacerbation  CKD (chronic kidney disease) stage 2, GFR 60-89 ml/min GFR 99 and CrCl 81 stable since last measurment; BP control, BS control and statin  Diabetes mellitus type 2, controlled, with complications Last T7G 5.5, due for labs; controlled with diet and low dose of lantus  Hyperlipidemia Most recent 09/2013 LDL 165, HDL 39 on lipitor 40 mg with no new labs;order and follow up on increasing dose if neccesary  S/P AKA (above knee amputation) Chronic and stable with no wounds or pain    Hennie Duos, MD

## 2014-01-04 ENCOUNTER — Encounter: Payer: Self-pay | Admitting: Internal Medicine

## 2014-01-04 DIAGNOSIS — I6930 Unspecified sequelae of cerebral infarction: Secondary | ICD-10-CM | POA: Insufficient documentation

## 2014-01-04 NOTE — Assessment & Plan Note (Signed)
With R 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;ASA daily for prophylaxis

## 2014-01-04 NOTE — Assessment & Plan Note (Signed)
Chronic and stable with 13.3/39.3 and folate > 20 so plan d/c folate

## 2014-01-04 NOTE — Assessment & Plan Note (Signed)
2/2 CVA, chronic and stable without decline;speaks with one word answers and hand gestures; ASA as prophylaxis

## 2014-01-04 NOTE — Assessment & Plan Note (Signed)
Chronic and stable with no wounds or pain

## 2014-01-04 NOTE — Assessment & Plan Note (Signed)
Last a1c 5.5, due for labs; controlled with diet and low dose of lantus

## 2014-01-04 NOTE — Assessment & Plan Note (Signed)
Stable on coreg and lasix

## 2014-01-04 NOTE — Assessment & Plan Note (Signed)
GFR 99 and CrCl 81 stable since last measurment; BP control, BS control and statin

## 2014-01-04 NOTE — Assessment & Plan Note (Signed)
Chronic and stable on coreg and lasix with no recent exacerbation

## 2014-01-04 NOTE — Assessment & Plan Note (Signed)
Most recent 09/2013 LDL 165, HDL 39 on lipitor 40 mg with no new labs;order and follow up on increasing dose if neccesary

## 2014-01-18 ENCOUNTER — Encounter (HOSPITAL_COMMUNITY): Payer: Self-pay | Admitting: Vascular Surgery

## 2014-02-19 ENCOUNTER — Non-Acute Institutional Stay (SKILLED_NURSING_FACILITY): Payer: Medicare Other | Admitting: Internal Medicine

## 2014-02-19 DIAGNOSIS — Z89612 Acquired absence of left leg above knee: Secondary | ICD-10-CM

## 2014-02-19 DIAGNOSIS — F339 Major depressive disorder, recurrent, unspecified: Secondary | ICD-10-CM

## 2014-02-19 DIAGNOSIS — D529 Folate deficiency anemia, unspecified: Secondary | ICD-10-CM

## 2014-02-19 DIAGNOSIS — F0390 Unspecified dementia without behavioral disturbance: Secondary | ICD-10-CM

## 2014-02-19 DIAGNOSIS — I69920 Aphasia following unspecified cerebrovascular disease: Secondary | ICD-10-CM

## 2014-02-19 DIAGNOSIS — D509 Iron deficiency anemia, unspecified: Secondary | ICD-10-CM

## 2014-02-19 DIAGNOSIS — N400 Enlarged prostate without lower urinary tract symptoms: Secondary | ICD-10-CM

## 2014-02-19 DIAGNOSIS — I6930 Unspecified sequelae of cerebral infarction: Secondary | ICD-10-CM

## 2014-02-19 DIAGNOSIS — I69320 Aphasia following cerebral infarction: Secondary | ICD-10-CM

## 2014-02-19 NOTE — Progress Notes (Signed)
MRN: [id number] Name: Jeremy Howell  Sex: male Age: 59 y.o. DOB: [birth year]  [place] #: Helene Kelp Facility/Room: Level Of Care: SNF Provider: Inocencio Homes D Emergency Contacts: Extended Emergency Contact Information Primary Emergency Contact: [facility] Address: [address]          [place], [place] [address] Johnnette Litter of [place] Phone: [phone] Work Phone: [phone] Mobile Phone: [phone] Relation: Brother Secondary Emergency Contact: Lelon Huh Address: [place] [address]          [place], [place] [address] [place] Phone: [phone] Mobile Phone: [phone] Relation: Daughter  Code Status: FULL  Allergies: Almond oil  Chief Complaint  Patient presents with  . Medical Management of Chronic Issues    HPI: Patient is 59 y.o. male who is being seen for routine problems  Past Medical History  Diagnosis Date  . Cardiomyopathy     EF 20-25% by echo 01/2010 with severe LVH felt secondary to substance abuse (no ischemic workup)  . Diabetes mellitus   . HTN (hypertension)   . Polysubstance abuse     Reported hx of cocaine, THC, heroin, EtOH abuse  . Noncompliance   . Peripheral vascular disease   . CHF (congestive heart failure)   . Pneumonia   . Dependent for walking   . Wheelchair bound   . Acute renal failure   . Gangrene of foot   . Streptococcal pneumonia   . 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 Hyperlipidemia   . Anemia   . Urinary retention   . Hyperkalemia     Past Surgical History  Procedure Laterality Date  . Total hip arthroplasty Bilateral   . Amputation  08/15/2011    Procedure: AMPUTATION ABOVE KNEE;  Surgeon: Elam Dutch, MD;  Location: [place];  Service: Vascular;  Laterality: Left;  Amputation End:  6387  . Femoral-tibial bypass graft  12/23/2011    Procedure: BYPASS GRAFT FEMORAL-TIBIAL ARTERY;  Surgeon: Elam Dutch, MD;  Location: [facility] OR;  Service: Vascular;  Laterality: Right;  Right Popliteal - Tibial Artery Bypass using Reversed Saphenous vein  . Abdominal aortagram N/A 10/16/2011    Procedure: ABDOMINAL Maxcine Ham;  Surgeon: Elam Dutch, MD;  Location: [facility] CATH LAB;  Service: Cardiovascular;  Laterality: N/A;  . Lower extremity angiogram N/A 02/12/2012    Procedure: LOWER EXTREMITY ANGIOGRAM;  Surgeon: Elam Dutch, MD;  Location: [facility] CATH LAB;  Service: Cardiovascular;  Laterality: N/A;  . Abdominal angiogram  02/12/2012    Procedure: ABDOMINAL ANGIOGRAM;  Surgeon: Elam Dutch, MD;  Location: [facility] CATH LAB;  Service: Cardiovascular;;  . Percutaneous stent intervention  02/12/2012    Procedure: PERCUTANEOUS STENT INTERVENTION;  Surgeon: Elam Dutch, MD;  Location: [facility] CATH LAB;  Service: Cardiovascular;;  . Abdominal aortagram N/A 10/14/2012    Procedure: ABDOMINAL Maxcine Ham;  Surgeon: Elam Dutch, MD;  Location: [facility] CATH LAB;  Service: Cardiovascular;  Laterality: N/A;      Medication List       This list is accurate as of: 02/19/14 11:59 PM.  Always use your most recent med list.               aspirin 325 MG tablet  Take 325 mg by mouth daily.     bisacodyl 10 MG suppository  Commonly known as:  DULCOLAX  Place 10 mg rectally as needed for moderate constipation.  carvedilol 25 MG tablet  Commonly known as:  COREG  Take 25 mg by mouth daily.     citalopram 20 MG tablet  Commonly known as:  CELEXA  Take 20 mg by mouth daily.     docusate sodium 100 MG capsule  Commonly known as:  COLACE  Take 100 mg by mouth 2 (two) times daily.     ferrous sulfate 325 (65 FE) MG tablet  Take 325 mg by mouth 3 (three) times daily with meals.     folic acid 1 MG tablet  Commonly known as:  FOLVITE  Take 1 mg by mouth daily.     furosemide 20 MG tablet  Commonly known as:  LASIX  Take 20 mg by mouth daily.     LANTUS SOLOSTAR 100 UNIT/ML Solostar Pen  Generic drug:  Insulin  Glargine  Inject 5 Units into the skin every evening.     magnesium hydroxide 400 MG/5ML suspension  Commonly known as:  MILK OF MAGNESIA  Take by mouth daily as needed for mild constipation.     Olopatadine HCl 0.2 % Soln  Place 1 drop into both eyes every evening.     polyethylene glycol packet  Commonly known as:  MIRALAX / GLYCOLAX  Take 17 g by mouth daily.     pravastatin 20 MG tablet  Commonly known as:  PRAVACHOL  Take 20 mg by mouth daily.     tamsulosin 0.4 MG Caps capsule  Commonly known as:  FLOMAX  Take 1 capsule (0.4 mg total) by mouth daily after breakfast.     Vitamin D (Ergocalciferol) 50000 UNITS Caps capsule  Commonly known as:  DRISDOL  Take 50,000 Units by mouth every 30 (thirty) days. 14th of the month        No orders of the defined types were placed in this encounter.    Immunization History  Administered Date(s) Administered  . Influenza Split 10/24/2011  . Influenza-Unspecified 11/09/2012  . PPD Test 07/23/2011  . Pneumococcal Polysaccharide-23 10/24/2011  . Pneumococcal-Unspecified 09/12/2012    History  Substance Use Topics  . Smoking status: Former Smoker -- 15 years    Types: Cigarettes    Quit date: 08/10/2011  . Smokeless tobacco: Never Used     Comment: declined  . Alcohol Use: No    Review of Systems  DATA OBTAINED: from patient, nurse, medical record GENERAL:  no fevers, fatigue, appetite changes SKIN: No itching, rash HEENT: No complaint RESPIRATORY: No cough, wheezing, SOB CARDIAC: No chest pain, palpitations, lower extremity edema  GI: No abdominal pain, No N/V/D or constipation, No heartburn or reflux  GU: No dysuria, frequency or urgency, or incontinence  MUSCULOSKELETAL: No unrelieved bone/joint pain NEUROLOGIC: No headache, dizziness  PSYCHIATRIC: No overt anxiety or sadness  Filed Vitals:   02/19/14 2129  BP: 126/72  Pulse: 69  Temp: 98.4 F (36.9 C)  Resp: 20    Physical Exam  GENERAL APPEARANCE:  Alert, nonconversant, No acute distress  SKIN: No diaphoresis rash, or wounds HEENT: Unremarkable RESPIRATORY: Breathing is even, unlabored. Lung sounds are clear   CARDIOVASCULAR: Heart RRR no murmurs, rubs or gallops. No peripheral edema  GASTROINTESTINAL: Abdomen is soft, non-tender, not distended w/ normal bowel sounds.  GENITOURINARY: Bladder non tender, not distended  MUSCULOSKELETAL: L AKA NEUROLOGIC: pt with aphasia and L side weakness PSYCHIATRIC: Mood and affect appropriate to situation, no behavioral issues  Patient Active Problem List   Diagnosis Date Noted  . Enlarged prostate without urinary  obstruction 02/25/2014  . Anemia, iron deficiency 02/25/2014  . H/O: stroke with residual effects 01/04/2014  . Major depression, recurrent, chronic 11/06/2013  . Constipation, chronic 06/05/2013  . S/P AKA (above knee amputation) 03/23/2013  . Dementia without behavioral disturbance 03/23/2013  . Depression with anxiety 03/23/2013  . CKD (chronic kidney disease) stage 2, GFR 60-89 ml/min 03/23/2013  . Hyperlipidemia   . Stroke   . AKI (acute kidney injury) 02/27/2013  . Urinary retention 02/27/2013  . Hyperkalemia 02/27/2013  . Pain in limb-Right Thigh 02/07/2013  . 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 Peripheral vascular disease, unspecified 11/26/2011  . Preoperative examination, unspecified 11/26/2011  . Acute respiratory failure with hypoxia 10/23/2011  . Dehydration 10/23/2011  . Preop cardiovascular exam 10/21/2011  . Atherosclerosis of native arteries of the extremities with gangrene 09/24/2011  . Aftercare following surgery of the circulatory system, [place] 09/24/2011  . Gangrene of foot 08/14/2011  . PVD (peripheral vascular disease) 08/14/2011  . Metabolic acidosis 07/62/2633  . History of CVA (cerebrovascular accident) 08/14/2011  . Rhabdomyolysis 08/14/2011  . HTN (hypertension) 08/14/2011  . Hypertensive emergency 02/11/2011  . Diabetes mellitus type 2,  controlled, with complications 35/45/6256  . Chronic combined mod- severe systolic (EF 38-93%) and grade 1 diastolic CHF (congestive heart failure) 02/11/2011  . Aphasia as late effect of cerebrovascular accident 02/11/2011    CBC    Component Value Date/Time   WBC 4.6 03/02/2013 0344   RBC 4.28 03/02/2013 0344   HGB 10.9* 03/02/2013 0344   HCT 33.7* 03/02/2013 0344   PLT 246 03/02/2013 0344   MCV 78.7 03/02/2013 0344   LYMPHSABS 1.5 02/27/2013 1919   MONOABS 0.7 02/27/2013 1919   EOSABS 0.1 02/27/2013 1919   BASOSABS 0.0 02/27/2013 1919    CMP     Component Value Date/Time   NA 143 03/02/2013 0344   K 4.2 03/02/2013 0344   CL 104 03/02/2013 0344   CO2 28 03/02/2013 0344   GLUCOSE 105* 03/02/2013 0344   BUN 16 03/02/2013 0344   CREATININE 1.05 03/02/2013 0344   CALCIUM 8.3* 03/02/2013 0344   PROT 8.3 12/22/2011 1144   ALBUMIN 3.5 12/22/2011 1144   AST 23 12/22/2011 1144   ALT 33 12/22/2011 1144   ALKPHOS 93 12/22/2011 1144   BILITOT 0.2* 12/22/2011 1144   GFRNONAA 76* 03/02/2013 0344   GFRAA 89* 03/02/2013 0344    Assessment and Plan  Dementia without behavioral disturbance Significant expressive aphasia but pt is able to communicate needs with gestures and verbal cues;self propels around facility, transfers independently, requires some help with ADL's; pt has had no declines since last visit   Major depression, recurrent, chronic Chronic and stable without acute sx;plan continue celexa 10 mg daily   Enlarged prostate without urinary obstruction Stable on flomax without signs or sx of acute problem;plan continue flomax   Anemia, iron deficiency H/H 13.3/39.3 with iron 66;plan to contie ferous sulfate supplementation   Aphasia as late effect of cerebrovascular accident No declines, pt is doing well with communicating with what he has;plan to continue ASA 325 mg daily   H/O: stroke with residual effects Continue ASA daily;no change in R  hemiparesis   S/P AKA (above knee amputation) hronic and stable with no wounds or incisional problems     Hennie Duos, MD

## 2014-02-25 ENCOUNTER — Encounter: Payer: Self-pay | Admitting: Internal Medicine

## 2014-02-25 DIAGNOSIS — S78112A Complete traumatic amputation at level between left hip and knee, initial encounter: Secondary | ICD-10-CM | POA: Insufficient documentation

## 2014-02-25 DIAGNOSIS — I69320 Aphasia following cerebral infarction: Secondary | ICD-10-CM | POA: Insufficient documentation

## 2014-02-25 DIAGNOSIS — N400 Enlarged prostate without lower urinary tract symptoms: Secondary | ICD-10-CM | POA: Insufficient documentation

## 2014-02-25 DIAGNOSIS — D509 Iron deficiency anemia, unspecified: Secondary | ICD-10-CM | POA: Insufficient documentation

## 2014-02-25 NOTE — Assessment & Plan Note (Signed)
H/H 13.3/39.3 with iron 66;plan to contie ferous sulfate supplementation

## 2014-02-25 NOTE — Assessment & Plan Note (Signed)
Significant expressive aphasia but pt is able to communicate needs with gestures and verbal cues;self propels around facility, transfers independently, requires some help with ADL's; pt has had no declines since last visit

## 2014-02-25 NOTE — Assessment & Plan Note (Signed)
Continue ASA daily;no change in R hemiparesis

## 2014-02-25 NOTE — Assessment & Plan Note (Signed)
hronic and stable with no wounds or incisional problems

## 2014-02-25 NOTE — Assessment & Plan Note (Signed)
Chronic and stable without acute sx;plan continue celexa 10 mg daily

## 2014-02-25 NOTE — Assessment & Plan Note (Signed)
Stable on flomax without signs or sx of acute problem;plan continue flomax

## 2014-02-25 NOTE — Assessment & Plan Note (Signed)
No declines, pt is doing well with communicating with what he has;plan to continue ASA 325 mg daily

## 2014-03-07 IMAGING — CT CT HEAD W/O CM
1 of 2 series · 15 of 30 positions shown, 19 images · non-contrast
Comparison: 07/21/2011

CLINICAL DATA: Altered mental status.  Decreased oxygen saturation.
Loss of consciousness.

CT HEAD WITHOUT CONTRAST
TECHNIQUE: Contiguous axial images were obtained from the base of
the skull through the vertex without contrast.

[Series 3: head trauma 2.4 h60s · axial · 0.46mm/px · z∈[-164,-9]mm · 15 of 70 slices shown, 19 images]
[im 4/70  brain]
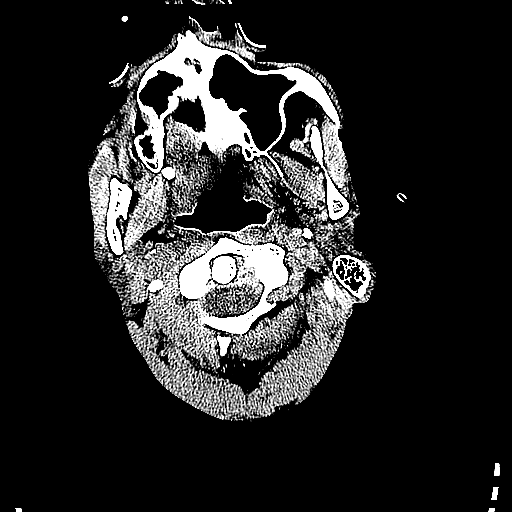
[im 4/70  bone]
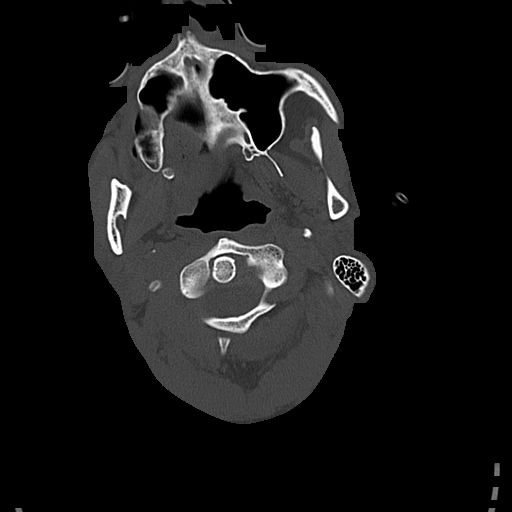
[im 8/70  brain]
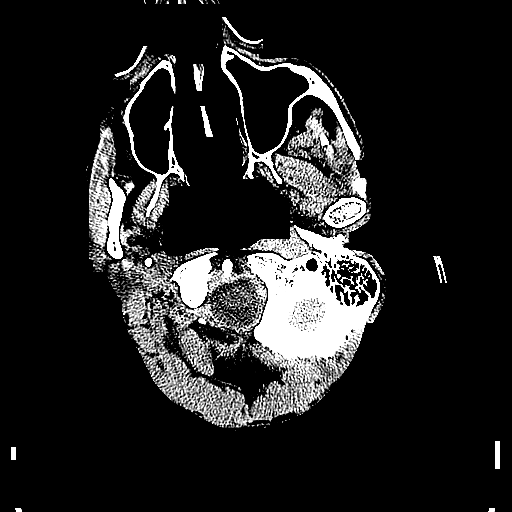
[im 15/70  brain]
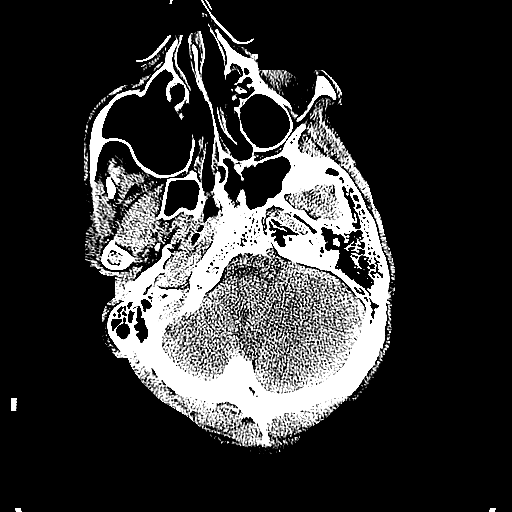
[im 19/70  brain]
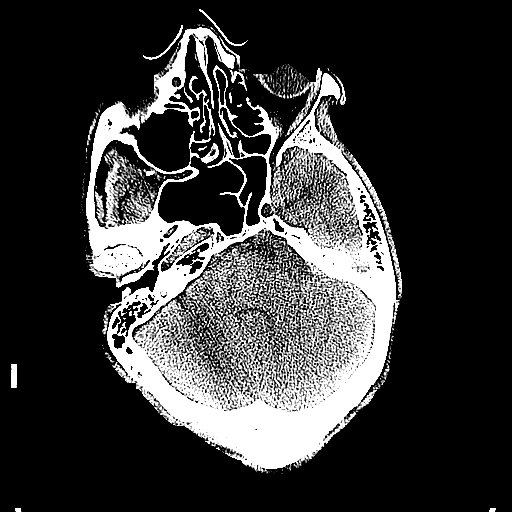
[im 22/70  brain]
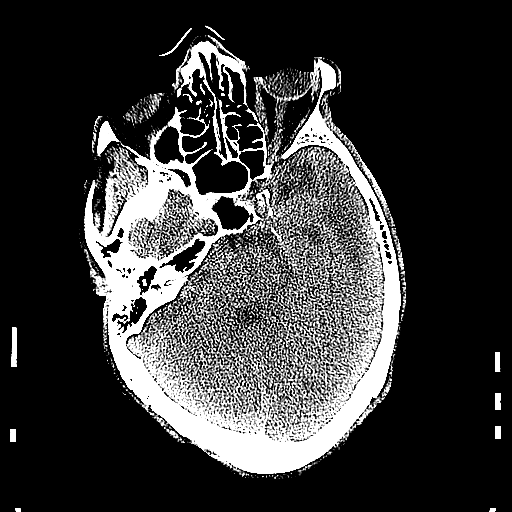
[im 22/70  bone]
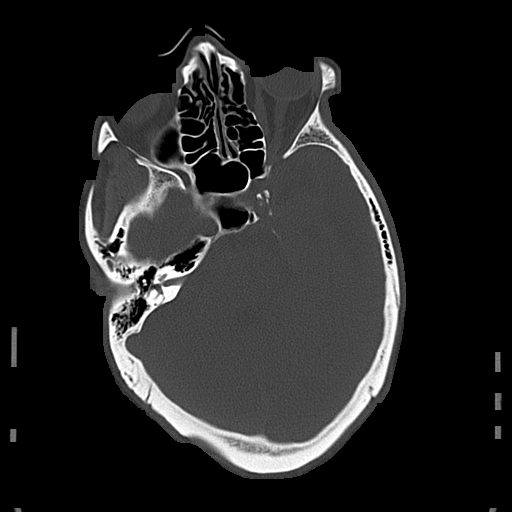
[im 26/70  brain]
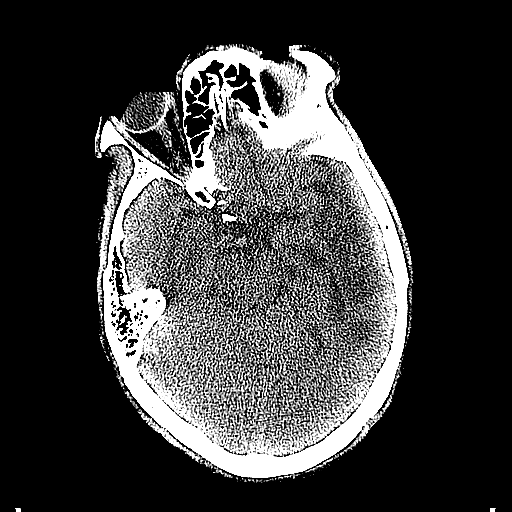
[im 30/70  brain]
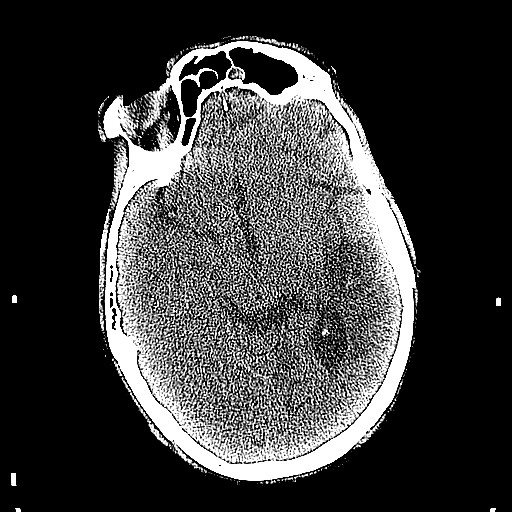
[im 37/70  brain]
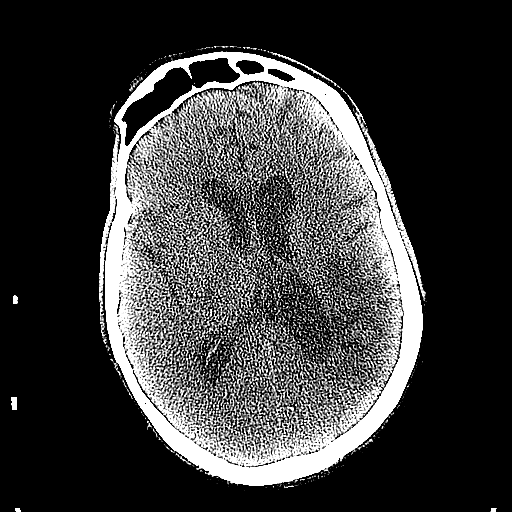
[im 40/70  brain]
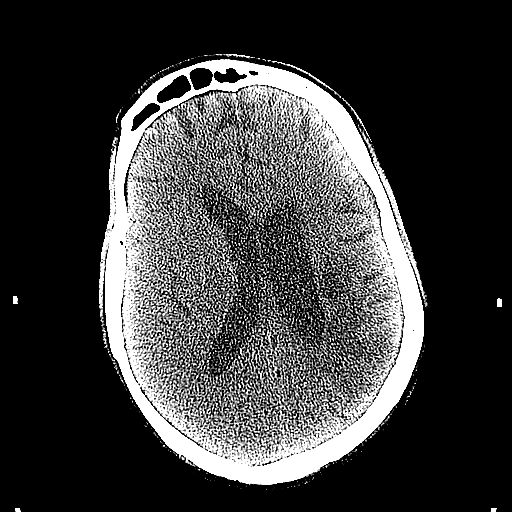
[im 40/70  bone]
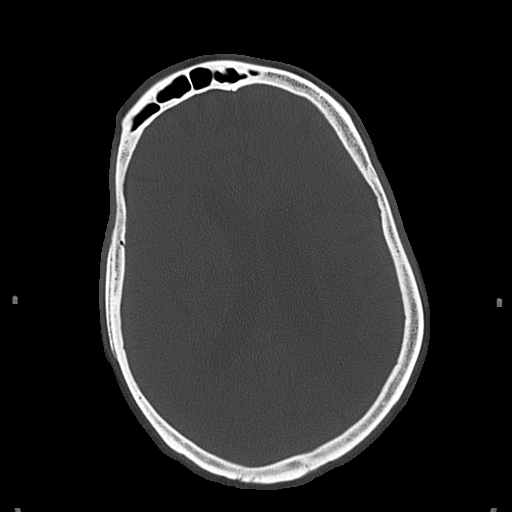
[im 44/70  brain]
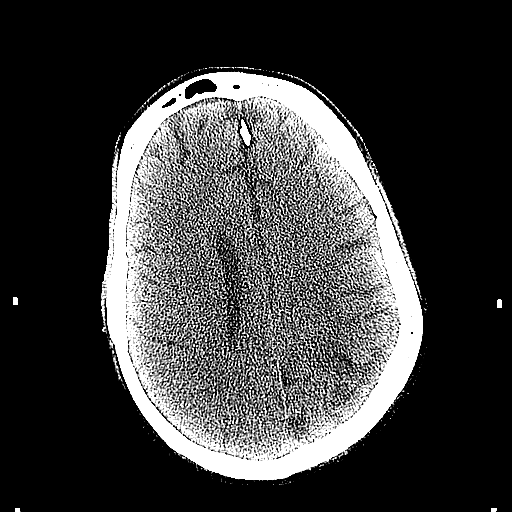
[im 48/70  brain]
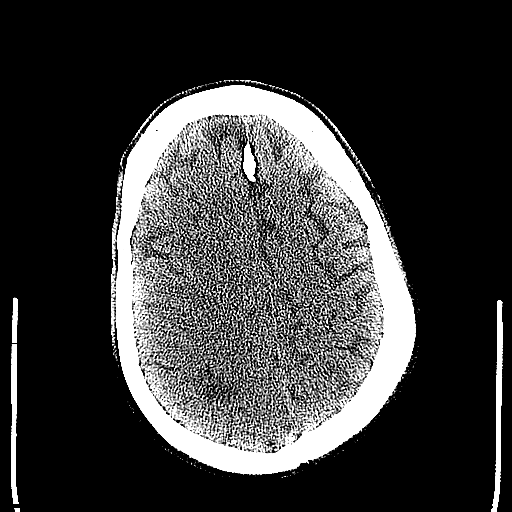
[im 51/70  brain]
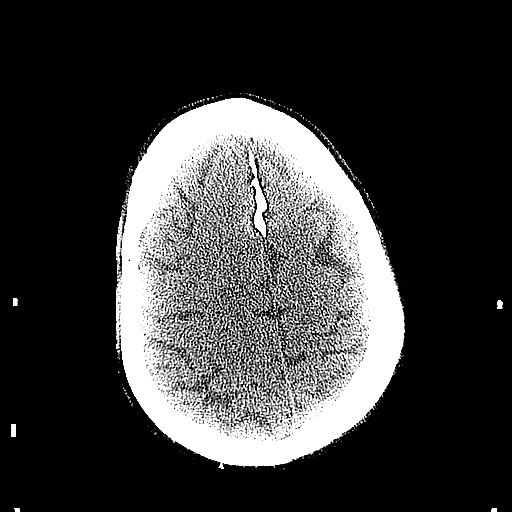
[im 59/70  brain]
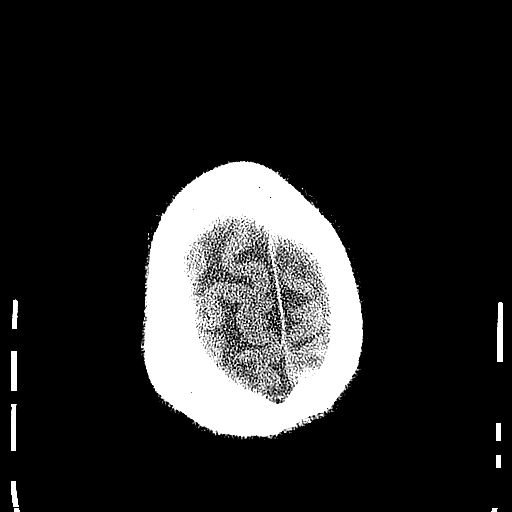
[im 59/70  bone]
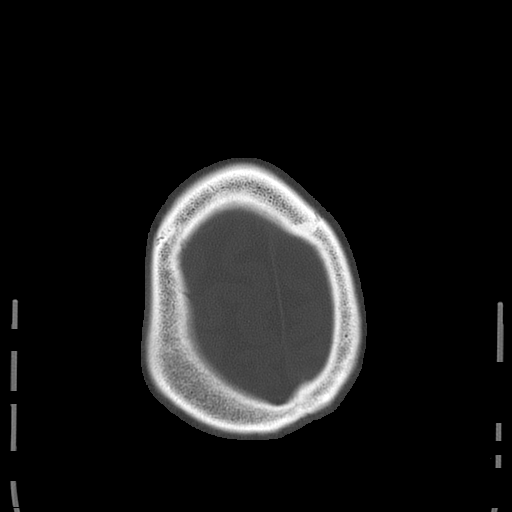
[im 62/70  brain]
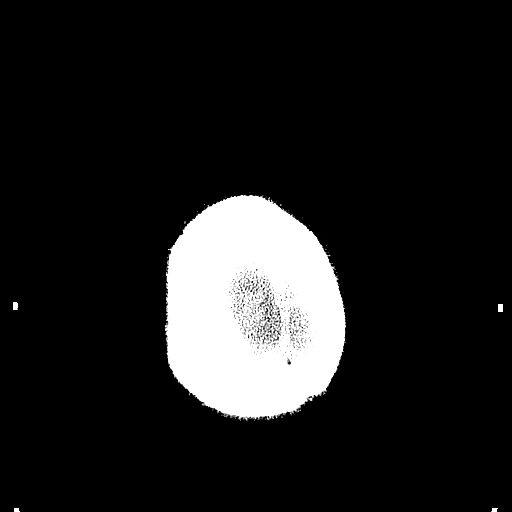
[im 66/70  brain]
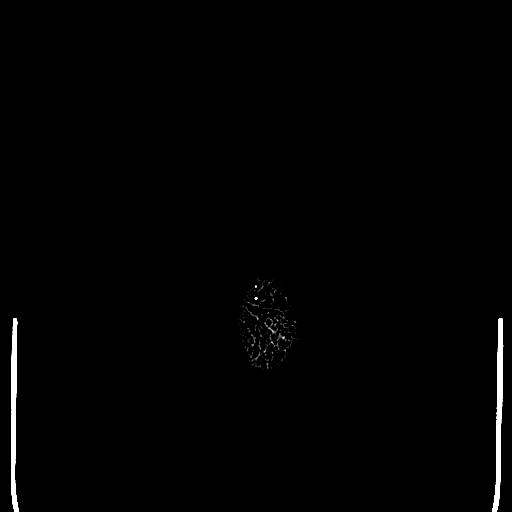

[15 of 30 positions shown; findings below may reference images not displayed]

FINDINGS: Diffuse cerebral atrophy.  Low attenuation changes in the
deep white matter consistent with central atrophy.  Large area of
encephalomalacia involving the left temporal parietal region.
Smaller area of encephalomalacia in the right posterior parietal
region.  These are stable since previous study and are consistent
with old infarcts.  No acute sulci effacement.  No mass effect or
midline shift.  Mild ventricular dilatation consistent with central
atrophy.  No abnormal extra-axial fluid collections.  Basal
cisterns are not effaced.  No evidence of acute intracranial
hemorrhage.  Retention cysts in the right maxillary antrum and left
frontal sinus.  Mastoid air cells are not opacified.  No depressed
skull fractures.
IMPRESSION: No acute intracranial abnormalities.  Old left temporoparietal and
right parietal infarcts.

## 2014-03-29 ENCOUNTER — Encounter: Payer: Self-pay | Admitting: Internal Medicine

## 2014-03-29 ENCOUNTER — Non-Acute Institutional Stay (SKILLED_NURSING_FACILITY): Payer: Medicare Other | Admitting: Internal Medicine

## 2014-03-29 DIAGNOSIS — I739 Peripheral vascular disease, unspecified: Secondary | ICD-10-CM

## 2014-03-29 DIAGNOSIS — N182 Chronic kidney disease, stage 2 (mild): Secondary | ICD-10-CM

## 2014-03-29 DIAGNOSIS — E785 Hyperlipidemia, unspecified: Secondary | ICD-10-CM

## 2014-03-29 DIAGNOSIS — I69320 Aphasia following cerebral infarction: Secondary | ICD-10-CM

## 2014-03-29 DIAGNOSIS — I10 Essential (primary) hypertension: Secondary | ICD-10-CM

## 2014-03-29 DIAGNOSIS — Z89612 Acquired absence of left leg above knee: Secondary | ICD-10-CM

## 2014-03-29 DIAGNOSIS — I69920 Aphasia following unspecified cerebrovascular disease: Secondary | ICD-10-CM

## 2014-03-29 NOTE — Assessment & Plan Note (Signed)
In 11/2013 GFR 87, CrCl- 72, basically unchanged from prior;Plan-continueLasix and start low dose lisinopril 2.5 mg

## 2014-03-29 NOTE — Assessment & Plan Note (Signed)
Chronic and stable without pain or problems since last visit

## 2014-03-29 NOTE — Assessment & Plan Note (Signed)
Chronic and stable with no reported peripheral wounds;Continue statin and ASA 325;pt s/p L AKA na]and R fem/pop

## 2014-03-29 NOTE — Assessment & Plan Note (Signed)
Chronic and stable;able to communicate with short answers and hand gestures; Plan continue ASA 325mg  and SP prn

## 2014-03-29 NOTE — Progress Notes (Signed)
MRN: [id number] Name: Jeremy Howell  Sex: male Age: 59 y.o. DOB: [birth year]  [place] [facility] Facility/Room:121B Level Of Care: SNF Provider: Inocencio Homes D Emergency Contacts: Extended Emergency Contact Information Primary Emergency Contact: [facility] Address: [address]          [place], [place] [address] Johnnette Litter of [place] Phone: [phone] Work Phone: [phone] Mobile Phone: [phone] Relation: Brother Secondary Emergency Contact: Lelon Huh Address: [address]          [place], [place] [address] [place] of [place] Phone: [phone] Mobile Phone: [phone] Relation: Daughter  Code Status: FULL  Allergies: Almond oil  Chief Complaint  Patient presents with  . Medical Management of Chronic Issues    HPI: Patient is 59 y.o. male who is being seen for routine issues. He has I and D L buttock boil last night with a separate note.  Past Medical History  Diagnosis Date  . Cardiomyopathy     EF 20-25% by echo 01/2010 with severe LVH felt secondary to substance abuse (no ischemic workup)  . Diabetes mellitus   . HTN (hypertension)   . Polysubstance abuse     Reported hx of cocaine, THC, heroin, EtOH abuse  . Noncompliance   . Peripheral vascular disease   . CHF (congestive heart failure)   . Pneumonia   . Dependent for walking   . Wheelchair bound   . Acute renal failure   . Gangrene of foot   . Streptococcal pneumonia   . 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 Hyperlipidemia   . Anemia   . Urinary retention   . Hyperkalemia     Past Surgical History  Procedure Laterality Date  . Total hip arthroplasty Bilateral   . Amputation  08/15/2011    Procedure: AMPUTATION ABOVE KNEE;  Surgeon: Elam Dutch, MD;  Location: [place];  Service: Vascular;  Laterality: Left;  Amputation End:  5093  . Femoral-tibial bypass graft  12/23/2011   Procedure: BYPASS GRAFT FEMORAL-TIBIAL ARTERY;  Surgeon: Elam Dutch, MD;  Location: [facility] OR;  Service: Vascular;  Laterality: Right;  Right Popliteal - Tibial Artery Bypass using Reversed Saphenous vein  . Abdominal aortagram N/A 10/16/2011    Procedure: ABDOMINAL Maxcine Ham;  Surgeon: Elam Dutch, MD;  Location: [facility] CATH LAB;  Service: Cardiovascular;  Laterality: N/A;  . Lower extremity angiogram N/A 02/12/2012    Procedure: LOWER EXTREMITY ANGIOGRAM;  Surgeon: Elam Dutch, MD;  Location: [facility] CATH LAB;  Service: Cardiovascular;  Laterality: N/A;  . Abdominal angiogram  02/12/2012    Procedure: ABDOMINAL ANGIOGRAM;  Surgeon: Elam Dutch, MD;  Location: [facility] CATH LAB;  Service: Cardiovascular;;  . Percutaneous stent intervention  02/12/2012    Procedure: PERCUTANEOUS STENT INTERVENTION;  Surgeon: Elam Dutch, MD;  Location: [facility] CATH LAB;  Service: Cardiovascular;;  . Abdominal aortagram N/A 10/14/2012    Procedure: ABDOMINAL Maxcine Ham;  Surgeon: Elam Dutch, MD;  Location: [facility] CATH LAB;  Service: Cardiovascular;  Laterality: N/A;      Medication List       This list is accurate as of: 03/29/14  7:13 PM.  Always use your most recent med list.               aspirin 325 MG tablet  Take 325 mg by mouth daily.     bisacodyl 10 MG suppository  Commonly known as:  DULCOLAX  Place 10 mg rectally as needed for moderate constipation.     carvedilol 25 MG tablet  Commonly known as:  COREG  Take 25 mg by mouth daily.     citalopram 20 MG tablet  Commonly known as:  CELEXA  Take 20 mg by mouth daily.     docusate sodium 100 MG capsule  Commonly known as:  COLACE  Take 100 mg by mouth 2 (two) times daily.     ferrous sulfate 325 (65 FE) MG tablet  Take 325 mg by mouth 3 (three) times daily with meals.     furosemide 20 MG tablet  Commonly known as:  LASIX  Take 20 mg by mouth daily.     LANTUS SOLOSTAR 100 UNIT/ML Solostar Pen  Generic drug:  Insulin Glargine  Inject 5  Units into the skin every evening.     magnesium hydroxide 400 MG/5ML suspension  Commonly known as:  MILK OF MAGNESIA  Take by mouth daily as needed for mild constipation.     Olopatadine HCl 0.2 % Soln  Place 1 drop into both eyes every evening.     polyethylene glycol packet  Commonly known as:  MIRALAX / GLYCOLAX  Take 17 g by mouth daily.     pravastatin 20 MG tablet  Commonly known as:  PRAVACHOL  Take 20 mg by mouth daily.     tamsulosin 0.4 MG Caps capsule  Commonly known as:  FLOMAX  Take 1 capsule (0.4 mg total) by mouth daily after breakfast.     Vitamin D (Ergocalciferol) 50000 UNITS Caps capsule  Commonly known as:  DRISDOL  Take 50,000 Units by mouth every 30 (thirty) days. 14th of the month        No orders of the defined types were placed in this encounter.    Immunization History  Administered Date(s) Administered  . Influenza Split 10/24/2011  . Influenza-Unspecified 11/09/2012  . PPD Test 07/23/2011  . Pneumococcal Polysaccharide-23 10/24/2011  . Pneumococcal-Unspecified 09/12/2012    History  Substance Use Topics  . Smoking status: Former Smoker -- 15 years    Types: Cigarettes    Quit date: 08/10/2011  . Smokeless tobacco: Never Used     Comment: declined  . Alcohol Use: No    Review of Systems  DATA OBTAINED: from patient, nurse GENERAL:  no fevers, fatigue, appetite changes SKIN: No itching, rash HEENT: No complaint RESPIRATORY: No cough, wheezing, SOB CARDIAC: No chest pain, palpitations, lower extremity edema  GI: No abdominal pain, No N/V/D or constipation, No heartburn or reflux  GU: No dysuria, frequency or urgency, or incontinence  MUSCULOSKELETAL: No unrelieved bone/joint pain NEUROLOGIC: No headache, dizziness  PSYCHIATRIC: No overt anxiety or sadness  Filed Vitals:   03/29/14 1853  BP: 126/72  Pulse: 69  Temp: 98 F (36.7 C)  Resp: 20    Physical Exam  GENERAL APPEARANCE: Alert, No acute distress  SKIN: No  diaphoresis rash; I and d site L buttock HEENT: Unremarkable RESPIRATORY: Breathing is even, unlabored. Lung sounds are clear   CARDIOVASCULAR: Heart RRR no murmurs, rubs or gallops. No peripheral edema  GASTROINTESTINAL: Abdomen is soft, non-tender, not distended w/ normal bowel sounds.  GENITOURINARY: Bladder non tender, not distended  MUSCULOSKELETAL: L AKA, contractures NEUROLOGIC: Cranial nerves 2-12 grossly intact PSYCHIATRIC: Mood and affect appropriate to situation, no behavioral issues  Patient Active Problem List   Diagnosis Date Noted  . Enlarged prostate without urinary obstruction 02/25/2014  . Anemia, iron deficiency  02/25/2014  . H/O: stroke with residual effects 01/04/2014  . Major depression, recurrent, chronic 11/06/2013  . Constipation, chronic 06/05/2013  . S/P AKA (above knee amputation) 03/23/2013  . Dementia without behavioral disturbance 03/23/2013  . Depression with anxiety 03/23/2013  . CKD (chronic kidney disease) stage 2, GFR 60-89 ml/min 03/23/2013  . Hyperlipidemia   . Stroke   . AKI (acute kidney injury) 02/27/2013  . Urinary retention 02/27/2013  . Hyperkalemia 02/27/2013  . Pain in limb-Right Thigh 02/07/2013  . 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 Peripheral vascular disease, unspecified 11/26/2011  . Preoperative examination, unspecified 11/26/2011  . Acute respiratory failure with hypoxia 10/23/2011  . Dehydration 10/23/2011  . Preop cardiovascular exam 10/21/2011  . Atherosclerosis of native arteries of the extremities with gangrene 09/24/2011  . Aftercare following surgery of the circulatory system, [place] 09/24/2011  . Gangrene of foot 08/14/2011  . PVD (peripheral vascular disease) 08/14/2011  . Metabolic acidosis 80/99/8338  . History of CVA (cerebrovascular accident) 08/14/2011  . Rhabdomyolysis 08/14/2011  . HTN (hypertension) 08/14/2011  . Hypertensive emergency 02/11/2011  . Diabetes mellitus type 2, controlled, with  complications 25/06/3974  . Chronic combined mod- severe systolic (EF 73-41%) and grade 1 diastolic CHF (congestive heart failure) 02/11/2011  . Aphasia as late effect of cerebrovascular accident 02/11/2011    CBC    Component Value Date/Time   WBC 4.6 03/02/2013 0344   RBC 4.28 03/02/2013 0344   HGB 10.9* 03/02/2013 0344   HCT 33.7* 03/02/2013 0344   PLT 246 03/02/2013 0344   MCV 78.7 03/02/2013 0344   LYMPHSABS 1.5 02/27/2013 1919   MONOABS 0.7 02/27/2013 1919   EOSABS 0.1 02/27/2013 1919   BASOSABS 0.0 02/27/2013 1919    CMP     Component Value Date/Time   NA 143 03/02/2013 0344   K 4.2 03/02/2013 0344   CL 104 03/02/2013 0344   CO2 28 03/02/2013 0344   GLUCOSE 105* 03/02/2013 0344   BUN 16 03/02/2013 0344   CREATININE 1.05 03/02/2013 0344   CALCIUM 8.3* 03/02/2013 0344   PROT 8.3 12/22/2011 1144   ALBUMIN 3.5 12/22/2011 1144   AST 23 12/22/2011 1144   ALT 33 12/22/2011 1144   ALKPHOS 93 12/22/2011 1144   BILITOT 0.2* 12/22/2011 1144   GFRNONAA 76* 03/02/2013 0344   GFRAA 89* 03/02/2013 0344    Assessment and Plan  Aphasia as late effect of cerebrovascular accident Chronic and stable;able to communicate with short answers and hand gestures; Plan continue ASA 325mg  and SP prn   S/P AKA (above knee amputation) Chronic and stable without pain or problems since last visit   Hyperlipidemia In 02/2014 LDL was 128, HDL-43; on lipitor 40 mg; minimal inc in ALT, monitor but continue statin   HTN (hypertension) Chronic and stable on coreg and lasix   CKD (chronic kidney disease) stage 2, GFR 60-89 ml/min In 11/2013 GFR 87, CrCl- 72, basically unchanged from prior;Plan-continueLasix and start low dose lisinopril 2.5 mg   PVD (peripheral vascular disease) Chronic and stable with no reported peripheral wounds;Continue statin and ASA 325;pt s/p L AKA na]and R fem/pop     Hennie Duos, MD

## 2014-03-29 NOTE — Assessment & Plan Note (Signed)
In 02/2014 LDL was 128, HDL-43; on lipitor 40 mg; minimal inc in ALT, monitor but continue statin

## 2014-03-29 NOTE — Assessment & Plan Note (Signed)
Chronic and stable on coreg and lasix

## 2014-05-23 ENCOUNTER — Emergency Department (HOSPITAL_COMMUNITY)
Admission: EM | Admit: 2014-05-23 | Discharge: 2014-05-23 | Disposition: A | Discharge status: Home / Self Care | Payer: Medicare Other | Attending: Emergency Medicine | Admitting: Emergency Medicine

## 2014-05-23 ENCOUNTER — Encounter (HOSPITAL_COMMUNITY): Payer: Self-pay | Admitting: Emergency Medicine

## 2014-05-23 DIAGNOSIS — Z9119 Patient's noncompliance with other medical treatment and regimen: Secondary | ICD-10-CM | POA: Insufficient documentation

## 2014-05-23 DIAGNOSIS — N400 Enlarged prostate without lower urinary tract symptoms: Secondary | ICD-10-CM | POA: Insufficient documentation

## 2014-05-23 DIAGNOSIS — Z794 Long term (current) use of insulin: Secondary | ICD-10-CM | POA: Diagnosis not present

## 2014-05-23 DIAGNOSIS — D649 Anemia, unspecified: Secondary | ICD-10-CM | POA: Diagnosis not present

## 2014-05-23 DIAGNOSIS — Z8701 Personal history of pneumonia (recurrent): Secondary | ICD-10-CM | POA: Diagnosis not present

## 2014-05-23 DIAGNOSIS — I509 Heart failure, unspecified: Secondary | ICD-10-CM | POA: Diagnosis not present

## 2014-05-23 DIAGNOSIS — E785 Hyperlipidemia, unspecified: Secondary | ICD-10-CM | POA: Diagnosis not present

## 2014-05-23 DIAGNOSIS — Z7982 Long term (current) use of aspirin: Secondary | ICD-10-CM | POA: Insufficient documentation

## 2014-05-23 DIAGNOSIS — Z8619 Personal history of other infectious and parasitic diseases: Secondary | ICD-10-CM | POA: Insufficient documentation

## 2014-05-23 DIAGNOSIS — R339 Retention of urine, unspecified: Secondary | ICD-10-CM | POA: Diagnosis not present

## 2014-05-23 DIAGNOSIS — E119 Type 2 diabetes mellitus without complications: Secondary | ICD-10-CM | POA: Insufficient documentation

## 2014-05-23 DIAGNOSIS — Z87891 Personal history of nicotine dependence: Secondary | ICD-10-CM | POA: Diagnosis not present

## 2014-05-23 DIAGNOSIS — Z8673 Personal history of transient ischemic attack (TIA), and cerebral infarction without residual deficits: Secondary | ICD-10-CM | POA: Diagnosis not present

## 2014-05-23 DIAGNOSIS — I10 Essential (primary) hypertension: Secondary | ICD-10-CM | POA: Diagnosis not present

## 2014-05-23 DIAGNOSIS — Z79899 Other long term (current) drug therapy: Secondary | ICD-10-CM | POA: Insufficient documentation

## 2014-05-23 LAB — URINALYSIS, ROUTINE W REFLEX MICROSCOPIC
Bilirubin Urine: NEGATIVE
GLUCOSE, UA: NEGATIVE mg/dL
Ketones, ur: NEGATIVE mg/dL
Leukocytes, UA: NEGATIVE
Nitrite: NEGATIVE
Protein, ur: NEGATIVE mg/dL
SPECIFIC GRAVITY, URINE: 1.017 (ref 1.005–1.030)
Urobilinogen, UA: 0.2 mg/dL (ref 0.0–1.0)
pH: 5 (ref 5.0–8.0)

## 2014-05-23 LAB — I-STAT CHEM 8, ED
BUN: 57 mg/dL — AB (ref 6–23)
Calcium, Ion: 1.19 mmol/L (ref 1.12–1.23)
Chloride: 102 mmol/L (ref 96–112)
Creatinine, Ser: 3.1 mg/dL — ABNORMAL HIGH (ref 0.50–1.35)
Glucose, Bld: 86 mg/dL (ref 70–99)
HEMATOCRIT: 43 % (ref 39.0–52.0)
Hemoglobin: 14.6 g/dL (ref 13.0–17.0)
Potassium: 5.2 mmol/L — ABNORMAL HIGH (ref 3.5–5.1)
SODIUM: 136 mmol/L (ref 135–145)
TCO2: 22 mmol/L (ref 0–100)

## 2014-05-23 LAB — URINE MICROSCOPIC-ADD ON

## 2014-05-23 NOTE — ED Provider Notes (Signed)
CSN: [id number]     Arrival date & time 05/23/14  0421 History   First MD Initiated Contact with Patient 05/23/14 0654     Chief Complaint  Patient presents with  . Urinary Retention     (Consider location/radiation/quality/duration/timing/severity/associated sxs/prior Treatment) HPI Comments: Pt comes in with cc of retained urine from the nursing home. Pt has hx of stroke and is residing in a nursing home. He also has hx of BPH, DM, substance abuse. Per nursing home, pt hasn't urinated in the past few hours. There is not back pain. No associated numbness, weakness, urinary incontinence, urinary retention, bowel incontinence, saddle anesthesia.    The history is provided by the patient.    Past Medical History  Diagnosis Date  . Cardiomyopathy     EF 20-25% by echo 01/2010 with severe LVH felt secondary to substance abuse (no ischemic workup)  . Diabetes mellitus   . HTN (hypertension)   . Polysubstance abuse     Reported hx of cocaine, THC, heroin, EtOH abuse  . Noncompliance   . Peripheral vascular disease   . CHF (congestive heart failure)   . Pneumonia   . Dependent for walking   . Wheelchair bound   . Acute renal failure   . Gangrene of foot   . Streptococcal pneumonia   . 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 Hyperlipidemia   . Anemia   . Urinary retention   . Hyperkalemia    Past Surgical History  Procedure Laterality Date  . Total hip arthroplasty Bilateral   . Amputation  08/15/2011    Procedure: AMPUTATION ABOVE KNEE;  Surgeon: Elam Dutch, MD;  Location: [place];  Service: Vascular;  Laterality: Left;  Amputation End:  0865  . Femoral-tibial bypass graft  12/23/2011    Procedure: BYPASS GRAFT FEMORAL-TIBIAL ARTERY;  Surgeon: Elam Dutch, MD;  Location: [facility] OR;  Service: Vascular;  Laterality: Right;  Right Popliteal - Tibial Artery Bypass using Reversed Saphenous vein  . Abdominal  aortagram N/A 10/16/2011    Procedure: ABDOMINAL Maxcine Ham;  Surgeon: Elam Dutch, MD;  Location: [facility] CATH LAB;  Service: Cardiovascular;  Laterality: N/A;  . Lower extremity angiogram N/A 02/12/2012    Procedure: LOWER EXTREMITY ANGIOGRAM;  Surgeon: Elam Dutch, MD;  Location: [facility] CATH LAB;  Service: Cardiovascular;  Laterality: N/A;  . Abdominal angiogram  02/12/2012    Procedure: ABDOMINAL ANGIOGRAM;  Surgeon: Elam Dutch, MD;  Location: [facility] CATH LAB;  Service: Cardiovascular;;  . Percutaneous stent intervention  02/12/2012    Procedure: PERCUTANEOUS STENT INTERVENTION;  Surgeon: Elam Dutch, MD;  Location: [facility] CATH LAB;  Service: Cardiovascular;;  . Abdominal aortagram N/A 10/14/2012    Procedure: ABDOMINAL Maxcine Ham;  Surgeon: Elam Dutch, MD;  Location: [facility] CATH LAB;  Service: Cardiovascular;  Laterality: N/A;   Family History  Problem Relation Age of Onset  . Stroke Brother   . Emphysema Brother   . Stroke Mother   . Heart disease Mother   . Diabetes Mother   . Hepatitis Mother   . Clotting disorder Mother   . Alzheimer's disease Mother   . Pneumonia Father    History  Substance Use Topics  . Smoking status: Former Smoker -- 15 years    Types: Cigarettes    Quit date: 08/10/2011  . Smokeless tobacco: Never Used     Comment: declined  .  Alcohol Use: No    Review of Systems  Constitutional: Negative for activity change.  Gastrointestinal: Positive for abdominal pain and abdominal distention.  Genitourinary: Negative for decreased urine volume.  Musculoskeletal: Negative for back pain.      Allergies  Almond oil  Home Medications   Prior to Admission medications   Medication Sig Start Date End Date Taking? Authorizing Provider  aspirin 325 MG tablet Take 325 mg by mouth daily.    Historical Provider, MD  bisacodyl (DULCOLAX) 10 MG suppository Place 10 mg rectally as needed for moderate constipation.    Historical Provider, MD  carvedilol (COREG) 25 MG  tablet Take 25 mg by mouth daily.     Historical Provider, MD  citalopram (CELEXA) 20 MG tablet Take 20 mg by mouth daily.  05/09/12   Historical Provider, MD  docusate sodium (COLACE) 100 MG capsule Take 100 mg by mouth 2 (two) times daily.    Historical Provider, MD  ferrous sulfate 325 (65 FE) MG tablet Take 325 mg by mouth 3 (three) times daily with meals.     Historical Provider, MD  furosemide (LASIX) 20 MG tablet Take 20 mg by mouth daily.    Historical Provider, MD  LANTUS SOLOSTAR 100 UNIT/ML injection Inject 5 Units into the skin every evening.  04/30/12   Historical Provider, MD  magnesium hydroxide (MILK OF MAGNESIA) 400 MG/5ML suspension Take by mouth daily as needed for mild constipation.    Historical Provider, MD  Olopatadine HCl 0.2 % SOLN Place 1 drop into both eyes every evening.    Historical Provider, MD  polyethylene glycol (MIRALAX / GLYCOLAX) packet Take 17 g by mouth daily. 05/03/13   Jerene Bears, MD  pravastatin (PRAVACHOL) 20 MG tablet Take 20 mg by mouth daily.    Historical Provider, MD  tamsulosin (FLOMAX) 0.4 MG CAPS capsule Take 1 capsule (0.4 mg total) by mouth daily after breakfast. 03/02/13   Melton Alar, PA-C  Vitamin D, Ergocalciferol, (DRISDOL) 50000 UNITS CAPS capsule Take 50,000 Units by mouth every 30 (thirty) days. 14th of the month    Historical Provider, MD   BP 117/68 mmHg  Pulse 69  Temp(Src) 98.3 F (36.8 C) (Oral)  Resp 18  SpO2 95% Physical Exam  Constitutional: He is oriented to person, place, and time. He appears well-developed.  HENT:  Head: Normocephalic and atraumatic.  Eyes: Conjunctivae and EOM are normal. Pupils are equal, round, and reactive to light.  Neck: Normal range of motion. Neck supple.  Cardiovascular: Normal rate and regular rhythm.   Pulmonary/Chest: Effort normal and breath sounds normal.  Abdominal: Soft. Bowel sounds are normal. He exhibits distension. There is tenderness. There is no rebound and no guarding.   Neurological: He is alert and oriented to person, place, and time.  Skin: Skin is warm.  Nursing note and vitals reviewed.   ED Course  Procedures (including critical care time) Labs Review Labs Reviewed  URINE CULTURE  URINALYSIS, ROUTINE W REFLEX MICROSCOPIC    Imaging Review No results found.   EKG Interpretation None      MDM   Final diagnoses:  Urinary retention    Pt comes in with urinary retention. Has BPH, DM, Strokes. Bladder scan showed urine > 500 cc. There is no back pain and no red flags for cord compression. Given the hx mentioned above, the etiology is likely one of those causes, or even meds and not the spinal cord. Will get a foley catheter. Will have  urology to follow.    Varney Biles, MD 05/23/14 [phone]

## 2014-05-23 NOTE — ED Notes (Signed)
Prior to placing cath, pt voided 30cc in urinal

## 2014-05-23 NOTE — ED Notes (Signed)
Bladder scan 519

## 2014-05-23 NOTE — Discharge Instructions (Signed)
Please have Jeremy Howell see a Urologist. We have placed the foley catheter, as he had 500 ml of urine that was retained in the bladder.   Acute Urinary Retention Acute urinary retention is the temporary inability to urinate. This is a common problem in older men. As men age their prostates become larger and block the flow of urine from the bladder. This is usually a problem that has come on gradually.  HOME CARE INSTRUCTIONS If you are sent home with a Foley catheter and a drainage system, you will need to discuss the best course of action with your health care provider. While the catheter is in, maintain a good intake of fluids. Keep the drainage bag emptied and lower than your catheter. This is so that contaminated urine will not flow back into your bladder, which could lead to a urinary tract infection. There are two main types of drainage bags. One is a large bag that usually is used at night. It has a good capacity that will allow you to sleep through the night without having to empty it. The second type is called a leg bag. It has a smaller capacity, so it needs to be emptied more frequently. However, the main advantage is that it can be attached by a leg strap and can go underneath your clothing, allowing you the freedom to move about or leave your home. Only take over-the-counter or prescription medicines for pain, discomfort, or fever as directed by your health care provider.  SEEK MEDICAL CARE IF:  You develop a low-grade fever.  You experience spasms or leakage of urine with the spasms. SEEK IMMEDIATE MEDICAL CARE IF:   You develop chills or fever.  Your catheter stops draining urine.  Your catheter falls out.  You start to develop increased bleeding that does not respond to rest and increased fluid intake. MAKE SURE YOU:  Understand these instructions.  Will watch your condition.  Will get help right away if you are not doing well or get worse. Document Released: 05/04/2000  Document Revised: 01/31/2013 Document Reviewed: 07/07/2012 [facility] Patient Information 2015 [place], [place]. This information is not intended to replace advice given to you by your health care provider. Make sure you discuss any questions you have with your health care provider.

## 2014-05-23 NOTE — ED Notes (Signed)
Contacted Sherry at [facility] regarding discharge instructions.  Paperwork given to [employer]. Pt comfortable with discharge and follow up instructions as well.

## 2014-05-23 NOTE — ED Notes (Signed)
Cletus Gash, NP left a message with UA results

## 2014-05-23 NOTE — ED Notes (Signed)
Spoke with NP from facility & updated on plan of care

## 2014-05-23 NOTE — ED Notes (Signed)
Attempted foley cath, unsuccessful

## 2014-05-23 NOTE — ED Notes (Signed)
Pt brought by [facility] from [facility] for urinary retention since yesterday. Pt states he is no be able to urinate since yesterday, denies nausea or vomiting.

## 2014-05-23 NOTE — ED Notes (Signed)
[facility] called for pt transport. 

## 2014-05-24 LAB — URINE CULTURE
COLONY COUNT: NO GROWTH
CULTURE: NO GROWTH

## 2014-06-11 ENCOUNTER — Non-Acute Institutional Stay (SKILLED_NURSING_FACILITY): Payer: Medicare Other | Admitting: Internal Medicine

## 2014-06-11 DIAGNOSIS — N183 Chronic kidney disease, stage 3 unspecified: Secondary | ICD-10-CM

## 2014-06-11 DIAGNOSIS — I1310 Hypertensive heart and chronic kidney disease without heart failure, with stage 1 through stage 4 chronic kidney disease, or unspecified chronic kidney disease: Secondary | ICD-10-CM

## 2014-06-11 DIAGNOSIS — I5042 Chronic combined systolic (congestive) and diastolic (congestive) heart failure: Secondary | ICD-10-CM | POA: Diagnosis not present

## 2014-06-11 NOTE — Progress Notes (Signed)
MRN: [id number] Name: Jeremy Howell  Sex: male Age: 59 y.o. DOB: 1955/08/15  [place] #: Helene Kelp Facility/Room: 121B Level Of Care: SNF Provider: Inocencio Homes D Emergency Contacts: Extended Emergency Contact Information Primary Emergency Contact: [facility] Address: [address]          [place], [place] [address] Johnnette Litter of [place] Phone: [phone] Work Phone: [phone] Mobile Phone: [phone] Relation: Brother Secondary Emergency Contact: Lelon Huh Address: [address]          [place], [place] [address] [place] of [place] Phone: [phone] Mobile Phone: [phone] Relation: Daughter  Code Status: FULL  Allergies: Almond oil  Chief Complaint  Patient presents with  . Medical Management of Chronic Issues    HPI: Patient is 59 y.o. male who is being seen for routine issues.  Past Medical History  Diagnosis Date  . Cardiomyopathy     EF 20-25% by echo 01/2010 with severe LVH felt secondary to substance abuse (no ischemic workup)  . Diabetes mellitus   . HTN (hypertension)   . Polysubstance abuse     Reported hx of cocaine, THC, heroin, EtOH abuse  . Noncompliance   . Peripheral vascular disease   . CHF (congestive heart failure)   . Pneumonia   . Dependent for walking   . Wheelchair bound   . Acute renal failure   . Gangrene of foot   . Streptococcal pneumonia   . 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 Hyperlipidemia   . Anemia   . Urinary retention   . Hyperkalemia   . H/O: substance abuse     cocaine, heroin, ETOH, marijuana    Past Surgical History  Procedure Laterality Date  . Total hip arthroplasty Bilateral   . Amputation  08/15/2011    Procedure: AMPUTATION ABOVE KNEE;  Surgeon: Elam Dutch, MD;  Location: [place];  Service: Vascular;  Laterality: Left;  Amputation End:  7673  . Femoral-tibial bypass graft  12/23/2011    Procedure:  BYPASS GRAFT FEMORAL-TIBIAL ARTERY;  Surgeon: Elam Dutch, MD;  Location: [facility] OR;  Service: Vascular;  Laterality: Right;  Right Popliteal - Tibial Artery Bypass using Reversed Saphenous vein  . Abdominal aortagram N/A 10/16/2011    Procedure: ABDOMINAL Maxcine Ham;  Surgeon: Elam Dutch, MD;  Location: [facility] CATH LAB;  Service: Cardiovascular;  Laterality: N/A;  . Lower extremity angiogram N/A 02/12/2012    Procedure: LOWER EXTREMITY ANGIOGRAM;  Surgeon: Elam Dutch, MD;  Location: [facility] CATH LAB;  Service: Cardiovascular;  Laterality: N/A;  . Abdominal angiogram  02/12/2012    Procedure: ABDOMINAL ANGIOGRAM;  Surgeon: Elam Dutch, MD;  Location: [facility] CATH LAB;  Service: Cardiovascular;;  . Percutaneous stent intervention  02/12/2012    Procedure: PERCUTANEOUS STENT INTERVENTION;  Surgeon: Elam Dutch, MD;  Location: [facility] CATH LAB;  Service: Cardiovascular;;  . Abdominal aortagram N/A 10/14/2012    Procedure: ABDOMINAL Maxcine Ham;  Surgeon: Elam Dutch, MD;  Location: [facility] CATH LAB;  Service: Cardiovascular;  Laterality: N/A;      Medication List       This list is accurate as of: 06/11/14 11:59 PM.  Always use your most recent med list.               aspirin 325 MG tablet  Take 325 mg by mouth daily.     bisacodyl 10 MG suppository  Commonly known  as:  DULCOLAX  Place 10 mg rectally as needed for moderate constipation.     carvedilol 25 MG tablet  Commonly known as:  COREG  Take 25 mg by mouth daily.     citalopram 20 MG tablet  Commonly known as:  CELEXA  Take 20 mg by mouth daily.     docusate sodium 100 MG capsule  Commonly known as:  COLACE  Take 100 mg by mouth 2 (two) times daily.     ferrous sulfate 325 (65 FE) MG tablet  Take 325 mg by mouth 3 (three) times daily with meals.     furosemide 20 MG tablet  Commonly known as:  LASIX  Take 20 mg by mouth daily.     LANTUS SOLOSTAR 100 UNIT/ML Solostar Pen  Generic drug:  Insulin Glargine  Inject 5 Units into  the skin every evening.     magnesium hydroxide 400 MG/5ML suspension  Commonly known as:  MILK OF MAGNESIA  Take by mouth daily as needed for mild constipation.     Olopatadine HCl 0.2 % Soln  Place 1 drop into both eyes every evening.     polyethylene glycol packet  Commonly known as:  MIRALAX / GLYCOLAX  Take 17 g by mouth daily.     pravastatin 20 MG tablet  Commonly known as:  PRAVACHOL  Take 20 mg by mouth daily.     tamsulosin 0.4 MG Caps capsule  Commonly known as:  FLOMAX  Take 1 capsule (0.4 mg total) by mouth daily after breakfast.     Vitamin D (Ergocalciferol) 50000 UNITS Caps capsule  Commonly known as:  DRISDOL  Take 50,000 Units by mouth every 30 (thirty) days. 14th of the month        No orders of the defined types were placed in this encounter.    Immunization History  Administered Date(s) Administered  . Influenza Split 10/24/2011  . Influenza-Unspecified 11/09/2012  . PPD Test 07/23/2011  . Pneumococcal Polysaccharide-23 10/24/2011  . Pneumococcal-Unspecified 09/12/2012    History  Substance Use Topics  . Smoking status: Former Smoker -- 15 years    Types: Cigarettes    Quit date: 08/10/2011  . Smokeless tobacco: Never Used     Comment: declined  . Alcohol Use: No    Review of Systems  DATA OBTAINED: from patient GENERAL:  no fevers, fatigue, appetite changes SKIN: No itching, rash HEENT: No complaint RESPIRATORY: No cough, wheezing, SOB CARDIAC: No chest pain, palpitations, lower extremity edema  GI: No abdominal pain, No N/V/D or constipation, No heartburn or reflux  GU: No dysuria, frequency or urgency, or incontinence  MUSCULOSKELETAL: No unrelieved bone/joint pain NEUROLOGIC: No headache, dizziness  PSYCHIATRIC: No overt anxiety or sadness  Filed Vitals:   06/24/14 1619  BP: 132/75  Pulse: 100  Temp: 97.5 F (36.4 C)  Resp: 16    Physical Exam  GENERAL APPEARANCE: Alert, conversant, No acute distress  SKIN: No  diaphoresis rash HEENT: Unremarkable RESPIRATORY: Breathing is even, unlabored. Lung sounds are clear   CARDIOVASCULAR: Heart RRR no murmurs, rubs or gallops. No peripheral edema  GASTROINTESTINAL: Abdomen is soft, non-tender, not distended w/ normal bowel sounds.  GENITOURINARY: Bladder non tender, not distended  MUSCULOSKELETAL: L AKA, contractures NEUROLOGIC: Cranial nerves 2-12 grossly intact; R side weakness PSYCHIATRIC: Mood and affect appropriate to situation, no behavioral issues  Patient Active Problem List   Diagnosis Date Noted  . H/O: substance abuse   . Enlarged prostate without urinary obstruction 02/25/2014  .  Anemia, iron deficiency 02/25/2014  . H/O: stroke with residual effects 01/04/2014  . Major depression, recurrent, chronic 11/06/2013  . Constipation, chronic 06/05/2013  . S/P AKA (above knee amputation) 03/23/2013  . Dementia without behavioral disturbance 03/23/2013  . Depression with anxiety 03/23/2013  . CKD (chronic kidney disease) stage 3, GFR 30-59 ml/min 03/23/2013  . Hyperlipidemia   . Stroke   . AKI (acute kidney injury) 02/27/2013  . Urinary retention 02/27/2013  . Hyperkalemia 02/27/2013  . Pain in limb-Right Thigh 02/07/2013  . 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 Peripheral vascular disease, unspecified 11/26/2011  . Preoperative examination, unspecified 11/26/2011  . Acute respiratory failure with hypoxia 10/23/2011  . Dehydration 10/23/2011  . Preop cardiovascular exam 10/21/2011  . Atherosclerosis of native arteries of the extremities with gangrene 09/24/2011  . Aftercare following surgery of the circulatory system, [place] 09/24/2011  . Gangrene of foot 08/14/2011  . PVD (peripheral vascular disease) 08/14/2011  . Metabolic acidosis 74/94/4967  . History of CVA (cerebrovascular accident) 08/14/2011  . Rhabdomyolysis 08/14/2011  . 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  Hypertensive emergency 02/11/2011  . Diabetes mellitus type 2, controlled, with complications 59/16/3846  . Chronic combined mod- severe systolic (EF 65-99%) and grade 1 diastolic CHF (congestive heart failure) 02/11/2011  . Aphasia as late effect of cerebrovascular accident 02/11/2011    CBC    Component Value Date/Time   WBC 4.6 03/02/2013 0344   RBC 4.28 03/02/2013 0344   HGB 14.6 05/23/2014 0909   HCT 43.0 05/23/2014 0909   PLT 246 03/02/2013 0344   MCV 78.7 03/02/2013 0344   LYMPHSABS 1.5 02/27/2013 1919   MONOABS 0.7 02/27/2013 1919   EOSABS 0.1 02/27/2013 1919   BASOSABS 0.0 02/27/2013 1919    CMP     Component Value Date/Time   NA 136 05/23/2014 0909   K 5.2* 05/23/2014 0909   CL 102 05/23/2014 0909   CO2 28 03/02/2013 0344   GLUCOSE 86 05/23/2014 0909   BUN 57* 05/23/2014 0909   CREATININE 3.10* 05/23/2014 0909   CALCIUM 8.3* 03/02/2013 0344   PROT 8.3 12/22/2011 1144   ALBUMIN 3.5 12/22/2011 1144   AST 23 12/22/2011 1144   ALT 33 12/22/2011 1144   ALKPHOS 93 12/22/2011 1144   BILITOT 0.2* 12/22/2011 1144   GFRNONAA 76* 03/02/2013 0344   GFRAA 89* 03/02/2013 0344    Assessment and Plan  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

## 2014-06-24 ENCOUNTER — Encounter: Payer: Self-pay | Admitting: Internal Medicine

## 2014-06-24 DIAGNOSIS — F1911 Other psychoactive substance abuse, in remission: Secondary | ICD-10-CM | POA: Insufficient documentation

## 2014-06-24 NOTE — Assessment & Plan Note (Signed)
Weights have been stable and no exacerbations; continue coreg 25 mg daily and daily lasix 20 mg

## 2014-06-24 NOTE — Assessment & Plan Note (Signed)
2/2 DM with CrCl -52 in 05/2014 and ACE d/c due to dec renal fx;will cont monitor

## 2014-06-24 NOTE — Assessment & Plan Note (Signed)
Continues stable on coreg and lasix

## 2014-12-20 ENCOUNTER — Non-Acute Institutional Stay (SKILLED_NURSING_FACILITY): Payer: Medicare Other | Admitting: Internal Medicine

## 2014-12-20 ENCOUNTER — Encounter: Payer: Self-pay | Admitting: Internal Medicine

## 2014-12-20 DIAGNOSIS — M205X1 Other deformities of toe(s) (acquired), right foot: Secondary | ICD-10-CM

## 2014-12-20 DIAGNOSIS — Z89612 Acquired absence of left leg above knee: Secondary | ICD-10-CM | POA: Diagnosis not present

## 2014-12-20 DIAGNOSIS — F339 Major depressive disorder, recurrent, unspecified: Secondary | ICD-10-CM

## 2014-12-20 DIAGNOSIS — E118 Type 2 diabetes mellitus with unspecified complications: Secondary | ICD-10-CM | POA: Diagnosis not present

## 2014-12-20 DIAGNOSIS — E785 Hyperlipidemia, unspecified: Secondary | ICD-10-CM

## 2014-12-20 DIAGNOSIS — I5042 Chronic combined systolic (congestive) and diastolic (congestive) heart failure: Secondary | ICD-10-CM | POA: Diagnosis not present

## 2014-12-20 DIAGNOSIS — I10 Essential (primary) hypertension: Secondary | ICD-10-CM | POA: Diagnosis not present

## 2014-12-20 DIAGNOSIS — I6930 Unspecified sequelae of cerebral infarction: Secondary | ICD-10-CM | POA: Diagnosis not present

## 2014-12-20 DIAGNOSIS — I739 Peripheral vascular disease, unspecified: Secondary | ICD-10-CM

## 2014-12-20 NOTE — Progress Notes (Signed)
Patient ID: Jeremy Howell, male   DOB: 07/08/1955, 59 y.o.   MRN: [id number]    DATE: 12/20/14  Location:  [facility]    Place of Service: SNF (31)   Extended Emergency Contact Information Primary Emergency Contact: [facility] Address: [address]          [place], [place] [address] Johnnette Litter of [place] Phone: [phone] Work Phone: [phone] Mobile Phone: [phone] Relation: Brother Secondary Emergency Contact: Lelon Huh Address: [address]          [place], [place] [address] [place] of [place] Phone: [phone] Mobile Phone: [phone] Relation: Daughter  Advanced Directive information Does patient have an advance directive?: Yes, Type of Advance Directive: Out of facility DNR (pink MOST or yellow form), Pre-existing out of facility DNR order (yellow form or pink MOST form): Pink MOST form placed in chart (order not valid for inpatient use)  Chief Complaint  Patient presents with  . Medical Management of Chronic Issues; OPTUM    HPI:  59 yo male long term resident seen today for f/u. He is c/a right st toe contracture that is occasionally painfl. He has had trouble since CVA. No numbness/tingling. He has left AKA but does not recall reasoning.   CHF/ CM - currently euvolemic on coreg and lasix  Elevated NH3 level/encephalopathy - stable on lactulose. Ammonia level 117 (down from 138). Albumin 3.5  PAD/hyperlipidemia/hx CVA - s/p left AKA. takes statin and ASA daily. He takes lamictal daily  HTN - BP controlled on coreg  Depression - mood stable on celexa.   BPH - stable on proscar and flomax  DM - A1c 6%  Past Medical History  Diagnosis Date  . Cardiomyopathy     EF 20-25% by echo 01/2010 with severe LVH felt secondary to substance abuse (no ischemic workup)  . Diabetes mellitus   . HTN (hypertension)   . Polysubstance abuse     Reported hx of cocaine, THC, heroin, EtOH abuse  . Noncompliance   .  Peripheral vascular disease ([place])   . CHF (congestive heart failure) ([place])   . Pneumonia   . Dependent for walking   . Wheelchair bound   . Acute renal failure ([place])   . Gangrene of foot ([place])   . Streptococcal pneumonia ([place])   . 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 Hyperlipidemia   . Anemia   . Urinary retention   . Hyperkalemia   . H/O: substance abuse     cocaine, heroin, ETOH, marijuana    Past Surgical History  Procedure Laterality Date  . Total hip arthroplasty Bilateral   . Amputation  08/15/2011    Procedure: AMPUTATION ABOVE KNEE;  Surgeon: Elam Dutch, MD;  Location: [place];  Service: Vascular;  Laterality: Left;  Amputation End:  [id number]  . Femoral-tibial bypass graft  12/23/2011    Procedure: BYPASS GRAFT FEMORAL-TIBIAL ARTERY;  Surgeon: Elam Dutch, MD;  Location: [facility] OR;  Service: Vascular;  Laterality: Right;  Right Popliteal - Tibial Artery Bypass using Reversed Saphenous vein  . Abdominal aortagram N/A 10/16/2011    Procedure: ABDOMINAL Maxcine Ham;  Surgeon: Elam Dutch, MD;  Location: [facility] CATH LAB;  Service: Cardiovascular;  Laterality: N/A;  . Lower extremity angiogram N/A 02/12/2012    Procedure: LOWER EXTREMITY ANGIOGRAM;  Surgeon: Elam Dutch, MD;  Location: [facility] CATH LAB;  Service:  Cardiovascular;  Laterality: N/A;  . Abdominal angiogram  02/12/2012    Procedure: ABDOMINAL ANGIOGRAM;  Surgeon: Elam Dutch, MD;  Location: [facility] CATH LAB;  Service: Cardiovascular;;  . Percutaneous stent intervention  02/12/2012    Procedure: PERCUTANEOUS STENT INTERVENTION;  Surgeon: Elam Dutch, MD;  Location: [facility] CATH LAB;  Service: Cardiovascular;;  . Abdominal aortagram N/A 10/14/2012    Procedure: ABDOMINAL Maxcine Ham;  Surgeon: Elam Dutch, MD;  Location: [facility] CATH LAB;  Service: Cardiovascular;  Laterality: N/A;    Patient Care Team: Hennie Duos, MD as PCP - General (Internal  Medicine)  Social History   Social History  . Marital Status: Single    Spouse Name: N/A  . Number of Children: 4  . Years of Education: N/A   Occupational History  . disabled    Social History Main Topics  . Smoking status: Former Smoker -- 15 years    Types: Cigarettes    Quit date: 08/10/2011  . Smokeless tobacco: Never Used     Comment: declined  . Alcohol Use: No  . Drug Use: No     Comment: + previous history  . Sexual Activity: Not on file   Other Topics Concern  . Not on file   Social History Narrative   Lives with sister      reports that he quit smoking about 3 years ago. His smoking use included Cigarettes. He quit after 15 years of use. He has never used smokeless tobacco. He reports that he does not drink alcohol or use illicit drugs.  Immunization History  Administered Date(s) Administered  . Influenza Split 10/24/2011  . Influenza-Unspecified 11/09/2012  . PPD Test 07/23/2011  . Pneumococcal Polysaccharide-23 10/24/2011  . Pneumococcal-Unspecified 09/12/2012    Allergies  Allergen Reactions  . Almond Oil Shortness Of Breath and Swelling    Medications: Patient's Medications  New Prescriptions   No medications on file  Previous Medications   ACETAMINOPHEN (TYLENOL) 325 MG TABLET    Take 650 mg by mouth every 6 (six) hours as needed.   ASPIRIN 325 MG TABLET    Take 325 mg by mouth daily.   ATORVASTATIN CALCIUM PO    Give 1 tablet by mouth every morning ( 40 mg )   BISACODYL (DULCOLAX) 10 MG SUPPOSITORY    Place 10 mg rectally as needed for moderate constipation.   CALCIUM CARBONATE-SIMETHICONE (MAALOX MAX PO)    Give 30 cc by mouth every 2 hours PRN   CARVEDILOL (COREG) 25 MG TABLET    Take 25 mg by mouth daily.    CITALOPRAM (CELEXA) 10 MG TABLET    Take 10 mg by mouth daily.   DOCUSATE SODIUM (COLACE) 100 MG CAPSULE    Take 100 mg by mouth 2 (two) times daily.   FERROUS SULFATE 325 (65 FE) MG TABLET    Take 325 mg by mouth 3 (three) times  daily with meals.    FINASTERIDE (PROSCAR) 5 MG TABLET    Take 5 mg by mouth daily.   FUROSEMIDE (LASIX) 20 MG TABLET    Take 20 mg by mouth daily.   LACTULOSE (CHRONULAC) 10 GM/15ML SOLUTION    Give 30 ml by mouth twice daily   LAMOTRIGINE 50 MG TBDP    1 tablet by mouth once daily   MAGNESIUM HYDROXIDE (MILK OF MAGNESIA) 400 MG/5ML SUSPENSION    Take by mouth daily as needed for mild constipation.   POLYETHYLENE GLYCOL (MIRALAX / GLYCOLAX) PACKET  Take 17 g by mouth daily.   TAMSULOSIN (FLOMAX) 0.4 MG CAPS CAPSULE    Take 1 capsule (0.4 mg total) by mouth daily after breakfast.   VITAMIN C (ASCORBIC ACID) 500 MG TABLET    Take 500 mg by mouth daily. To be given with iron   VITAMIN D, ERGOCALCIFEROL, (DRISDOL) 50000 UNITS CAPS CAPSULE    Take 50,000 Units by mouth every 30 (thirty) days. 14th of the month  Modified Medications   No medications on file  Discontinued Medications   CITALOPRAM (CELEXA) 20 MG TABLET    Take 20 mg by mouth daily.    LANTUS SOLOSTAR 100 UNIT/ML INJECTION    Inject 5 Units into the skin every evening.    OLOPATADINE HCL 0.2 % SOLN    Place 1 drop into both eyes every evening.   PRAVASTATIN (PRAVACHOL) 20 MG TABLET    Take 20 mg by mouth daily.    Review of Systems  Unable to perform ROS: Other  expressive aphasia  Filed Vitals:   12/20/14 1549  BP: 137/77  Pulse: 63  Resp: 18  Weight: 146 lb (66.225 kg)   Body mass index is 22.86 kg/(m^2).  Physical Exam  Constitutional: He appears well-developed.  Frail appearing in NAD  HENT:  Mouth/Throat: Oropharynx is clear and moist.  MM dry  Eyes: Pupils are equal, round, and reactive to light. No scleral icterus.  Neck: Neck supple. Carotid bruit is not present. No thyromegaly present.  Cardiovascular: Normal rate, regular rhythm, normal heart sounds and intact distal pulses.  Exam reveals no gallop and no friction rub.   No murmur heard. no distal LE swelling. No calf TTP  Pulmonary/Chest: Effort normal  and breath sounds normal. He has no wheezes. He has no rales. He exhibits no tenderness.  Abdominal: Soft. Bowel sounds are normal. He exhibits no distension, no abdominal bruit, no pulsatile midline mass and no mass. There is no tenderness. There is no rebound and no guarding.  Musculoskeletal: He exhibits edema.  Left AKA; RUE contracture; right 1st contracture  Lymphadenopathy:    He has no cervical adenopathy.  Neurological: He is alert.  Right hemiparesis  Skin: Skin is warm and dry. No rash noted.  Psychiatric: He has a normal mood and affect. His behavior is normal.  Expressive aphasia     Labs reviewed:   No results found.   Assessment/Plan   ICD-9-CM ICD-10-CM   1. Contracture of toe, right 735.8 M20.5X1    1st toe  2. H/O: stroke with residual effects - right hemiparesis and expressive aphasia 438.9 I69.30   3. Essential hypertension - stable 401.9 I10   4. Controlled type 2 diabetes mellitus with complication, without long-term current use of insulin (HCC) - diet controlled 250.90 E11.8   5. Chronic combined mod- severe systolic (EF 99991111) and grade 1 diastolic CHF (congestive heart failure) - stable 428.42 I50.42    428.0    6. PVD (peripheral vascular disease) (HCC) - stable 443.9 I73.9   7. Major depression, recurrent, chronic (HCC) - stable 296.30 F33.9   8. Hyperlipidemia - stable 272.4 E78.5   9. Status post above knee amputation of left lower extremity ([place]) V49.76 [phone]     Podiatry referral for toe contracture  Cont current meds as ordered  PT/OT/ST as indicated  CBGs as ordered  Will follow  Neizan Debruhl S. Perlie Gold  [facility] and [facility] [place], [place]  [address] [phone] Cell (Monday-Friday 8 AM - 5 PM) [phone] After 5 PM and follow prompts

## 2015-01-16 LAB — CBC AND DIFFERENTIAL
HCT: 43 % (ref 41–53)
HEMOGLOBIN: 13.9 g/dL (ref 13.5–17.5)
PLATELETS: 318 10*3/uL (ref 150–399)
WBC: 4.6 10*3/mL

## 2015-01-16 LAB — BASIC METABOLIC PANEL
BUN: 19 mg/dL (ref 4–21)
Creatinine: 1.1 mg/dL (ref 0.6–1.3)
Glucose: 106 mg/dL
POTASSIUM: 4 mmol/L (ref 3.4–5.3)
Sodium: 141 mmol/L (ref 137–147)

## 2015-01-16 LAB — LIPID PANEL
CHOLESTEROL: 178 mg/dL (ref 0–200)
HDL: 41 mg/dL (ref 35–70)
LDL Cholesterol: 102 mg/dL
TRIGLYCERIDES: 173 mg/dL — AB (ref 40–160)

## 2015-01-16 LAB — HEMOGLOBIN A1C: Hemoglobin A1C: 5.6

## 2015-01-16 LAB — HEPATIC FUNCTION PANEL
ALT: 35 U/L (ref 10–40)
AST: 23 U/L (ref 14–40)
Alkaline Phosphatase: 72 U/L (ref 25–125)
Bilirubin, Total: 0.3 mg/dL

## 2015-01-28 ENCOUNTER — Non-Acute Institutional Stay (SKILLED_NURSING_FACILITY): Payer: Medicare Other | Admitting: Internal Medicine

## 2015-01-28 DIAGNOSIS — I6930 Unspecified sequelae of cerebral infarction: Secondary | ICD-10-CM

## 2015-01-28 DIAGNOSIS — Z89612 Acquired absence of left leg above knee: Secondary | ICD-10-CM

## 2015-01-28 DIAGNOSIS — I10 Essential (primary) hypertension: Secondary | ICD-10-CM | POA: Diagnosis not present

## 2015-01-28 DIAGNOSIS — I5042 Chronic combined systolic (congestive) and diastolic (congestive) heart failure: Secondary | ICD-10-CM | POA: Diagnosis not present

## 2015-01-28 DIAGNOSIS — E118 Type 2 diabetes mellitus with unspecified complications: Secondary | ICD-10-CM | POA: Diagnosis not present

## 2015-01-28 DIAGNOSIS — I739 Peripheral vascular disease, unspecified: Secondary | ICD-10-CM | POA: Diagnosis not present

## 2015-01-28 DIAGNOSIS — F339 Major depressive disorder, recurrent, unspecified: Secondary | ICD-10-CM

## 2015-01-28 DIAGNOSIS — N183 Chronic kidney disease, stage 3 unspecified: Secondary | ICD-10-CM

## 2015-01-28 DIAGNOSIS — F0390 Unspecified dementia without behavioral disturbance: Secondary | ICD-10-CM | POA: Diagnosis not present

## 2015-01-30 ENCOUNTER — Encounter: Payer: Self-pay | Admitting: Internal Medicine

## 2015-01-30 NOTE — Progress Notes (Signed)
Patient ID: Jeremy Howell, male   DOB: 03/08/1955, 59 y.o.   MRN: [id number]    DATE:01/28/15  Location:  [facility]    Place of Service: SNF [id number])   Extended Emergency Contact Information Primary Emergency Contact: [facility] Address: [address]          [place], [place] [address] Johnnette Litter of [place] Phone: [phone] Work Phone: [phone] Mobile Phone: [phone] Relation: Brother Secondary Emergency Contact: Lelon Huh Address: [address]          [place],  [address] [place] Phone: [phone] Mobile Phone: [phone] Relation: Daughter  Advanced Directive information  [place]  Chief Complaint  Patient presents with  . Medical Management of Chronic Issues    HPI:   59 yo male seen today at SNF for f/u. He has no c/o. He is a poor historian due to dementia and aphasia. Hx obtained from chart.  CHF/ CM - currently euvolemic on coreg and lasix  DM - CBGs not checked on regular basis  Elevated NH3 level/encephalopathy - stable on lactulose  PAD/hyperlipidemia/hx CVA - takes statin and ASA daily. He takes lamictal daily  HTN - BP controlled on coreg  Depression - mood stable on celexa.   BPH - stable on proscar and flomax  He takes vitamins and minerals daily Past Medical History  Diagnosis Date  . Cardiomyopathy     EF 20-25% by echo 01/2010 with severe LVH felt secondary to substance abuse (no ischemic workup)  . Diabetes mellitus   . HTN (hypertension)   . Polysubstance abuse     Reported hx of cocaine, THC, heroin, EtOH abuse  . Noncompliance   . Peripheral vascular disease ([place])   . CHF (congestive heart failure) ([place])   . Pneumonia   . Dependent for walking   . Wheelchair bound   . Acute renal failure ([place])   . Gangrene of foot ([place])   . Streptococcal pneumonia ([place])   . 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 Hyperlipidemia   . Anemia   . Urinary retention   . Hyperkalemia   . H/O: substance abuse     cocaine, heroin, ETOH, marijuana    Past Surgical History  Procedure Laterality Date  . Total hip arthroplasty Bilateral   . Amputation  08/15/2011    Procedure: AMPUTATION ABOVE KNEE;  Surgeon: Elam Dutch, MD;  Location: [place];  Service: Vascular;  Laterality: Left;  Amputation End:  [id number]  . Femoral-tibial bypass graft  12/23/2011    Procedure: BYPASS GRAFT FEMORAL-TIBIAL ARTERY;  Surgeon: Elam Dutch, MD;  Location: [facility] OR;  Service: Vascular;  Laterality: Right;  Right Popliteal - Tibial Artery Bypass using Reversed Saphenous vein  . Abdominal aortagram N/A 10/16/2011    Procedure: ABDOMINAL Maxcine Ham;  Surgeon: Elam Dutch, MD;  Location: [facility] CATH LAB;  Service: Cardiovascular;  Laterality: N/A;  . Lower extremity angiogram N/A 02/12/2012    Procedure: LOWER EXTREMITY ANGIOGRAM;  Surgeon: Elam Dutch, MD;  Location: [facility] CATH LAB;  Service: Cardiovascular;  Laterality: N/A;  . Abdominal angiogram  02/12/2012    Procedure: ABDOMINAL ANGIOGRAM;  Surgeon: Elam Dutch, MD;  Location: [facility] CATH LAB;  Service: Cardiovascular;;  . Percutaneous stent intervention  02/12/2012    Procedure: PERCUTANEOUS STENT INTERVENTION;  Surgeon: Elam Dutch, MD;  Location: [facility] CATH LAB;  Service: Cardiovascular;;  . Abdominal aortagram N/A 10/14/2012    Procedure: ABDOMINAL Maxcine Ham;  Surgeon: Elam Dutch, MD;  Location: [facility] CATH LAB;  Service: Cardiovascular;  Laterality: N/A;    Patient Care Team: Hennie Duos, MD as PCP - General (Internal Medicine)  Social History   Social History  . Marital Status: Single    Spouse Name: N/A  . Number of Children: 4  . Years of Education: N/A   Occupational History  . disabled    Social History Main Topics  . Smoking status: Former Smoker -- 15 years    Types: Cigarettes    Quit date: 08/10/2011  . Smokeless tobacco:  Never Used     Comment: declined  . Alcohol Use: No  . Drug Use: No     Comment: + previous history  . Sexual Activity: Not on file   Other Topics Concern  . Not on file   Social History Narrative   Lives with sister      reports that he quit smoking about 3 years ago. His smoking use included Cigarettes. He quit after 15 years of use. He has never used smokeless tobacco. He reports that he does not drink alcohol or use illicit drugs.  Immunization History  Administered Date(s) Administered  . Influenza Split 10/24/2011  . Influenza-Unspecified 11/09/2012, 11/21/2014  . PPD Test 07/23/2011  . Pneumococcal Polysaccharide-23 10/24/2011  . Pneumococcal-Unspecified 09/12/2012    Allergies  Allergen Reactions  . Almond Oil Shortness Of Breath and Swelling    Medications: Patient's Medications  New Prescriptions   No medications on file  Previous Medications   ACETAMINOPHEN (TYLENOL) 325 MG TABLET    Take 650 mg by mouth every 6 (six) hours as needed.   ASPIRIN 325 MG TABLET    Take 325 mg by mouth daily.   ATORVASTATIN CALCIUM PO    Give 1 tablet by mouth every morning ( 40 mg )   BISACODYL (DULCOLAX) 10 MG SUPPOSITORY    Place 10 mg rectally as needed for moderate constipation.   CALCIUM CARBONATE-SIMETHICONE (MAALOX MAX PO)    Give 30 cc by mouth every 2 hours PRN   CARVEDILOL (COREG) 25 MG TABLET    Take 25 mg by mouth daily.    CITALOPRAM (CELEXA) 10 MG TABLET    Take 10 mg by mouth daily.   DOCUSATE SODIUM (COLACE) 100 MG CAPSULE    Take 100 mg by mouth 2 (two) times daily.   FERROUS SULFATE 325 (65 FE) MG TABLET    Take 325 mg by mouth 3 (three) times daily with meals.    FINASTERIDE (PROSCAR) 5 MG TABLET    Take 5 mg by mouth daily.   FUROSEMIDE (LASIX) 20 MG TABLET    Take 20 mg by mouth daily.   LACTULOSE (CHRONULAC) 10 GM/15ML SOLUTION    Give 30 ml by mouth twice daily   LAMOTRIGINE 50 MG TBDP    1 tablet by mouth once daily   MAGNESIUM HYDROXIDE (MILK OF  MAGNESIA) 400 MG/5ML SUSPENSION    Take by mouth daily as needed for mild constipation.   POLYETHYLENE GLYCOL (MIRALAX / GLYCOLAX) PACKET    Take 17 g by mouth daily.   TAMSULOSIN (FLOMAX) 0.4 MG CAPS CAPSULE    Take 1 capsule (0.4 mg total) by mouth daily after breakfast.   VITAMIN C (ASCORBIC ACID) 500 MG TABLET    Take 500 mg by mouth daily. To be given with iron   VITAMIN  D, ERGOCALCIFEROL, (DRISDOL) 50000 UNITS CAPS CAPSULE    Take 50,000 Units by mouth every 30 (thirty) days. 14th of the month  Modified Medications   No medications on file  Discontinued Medications   No medications on file    Review of Systems  Unable to perform ROS: Dementia    Filed Vitals:   01/28/15 2241  BP: 142/89  Pulse: 63  Temp: 97.3 F (36.3 C)   There is no weight on file to calculate BMI.  Physical Exam  Constitutional: He appears well-developed. No distress.  Sitting in w/c in NAD. Frail appearing  HENT:  Mouth/Throat: Oropharynx is clear and moist.  Eyes: Pupils are equal, round, and reactive to light. No scleral icterus.  Neck: Neck supple. Carotid bruit is not present. No thyromegaly present.  Cardiovascular: Normal rate, regular rhythm, normal heart sounds and intact distal pulses.  Exam reveals no gallop and no friction rub.   No murmur heard. no distal LE swelling. No calf TTP  Pulmonary/Chest: Effort normal and breath sounds normal. He has no wheezes. He has no rales. He exhibits no tenderness.  Abdominal: Soft. Bowel sounds are normal. He exhibits no distension, no abdominal bruit, no pulsatile midline mass and no mass. There is no tenderness. There is no rebound and no guarding.  Musculoskeletal:  RUE contracture with flexion at elbow and wrist; left AKA  Lymphadenopathy:    He has no cervical adenopathy.  Neurological: He is alert.  Skin: Skin is warm and dry. No rash noted.  Psychiatric: He has a normal mood and affect. His behavior is normal.  Expressive aphasia     Labs  reviewed: No visits with results within 3 Month(s) from this visit. Latest known visit with results is:  Admission on 05/23/2014, Discharged on 05/23/2014  Component Date Value Ref Range Status  . Color, Urine 05/23/2014 YELLOW  YELLOW Final  . APPearance 05/23/2014 CLOUDY* CLEAR Final  . Specific Gravity, Urine 05/23/2014 1.017  1.005 - 1.030 Final  . pH 05/23/2014 5.0  5.0 - 8.0 Final  . Glucose, UA 05/23/2014 NEGATIVE  NEGATIVE mg/dL Final  . Hgb urine dipstick 05/23/2014 TRACE* NEGATIVE Final  . Bilirubin Urine 05/23/2014 NEGATIVE  NEGATIVE Final  . Ketones, ur 05/23/2014 NEGATIVE  NEGATIVE mg/dL Final  . Protein, ur 05/23/2014 NEGATIVE  NEGATIVE mg/dL Final  . Urobilinogen, UA 05/23/2014 0.2  0.0 - 1.0 mg/dL Final  . Nitrite 05/23/2014 NEGATIVE  NEGATIVE Final  . Leukocytes, UA 05/23/2014 NEGATIVE  NEGATIVE Final  . Specimen Description 05/23/2014 URINE, CATHETERIZED   Final  . Special Requests 05/23/2014 NONE   Final  . Colony Count 05/23/2014    Final                   Value:NO GROWTH Performed at [facility]   . Culture 05/23/2014    Final                   Value:NO GROWTH Performed at [facility]   . Report Status 05/23/2014 05/24/2014 FINAL   Final  . Sodium 05/23/2014 136  135 - 145 mmol/L Final  . Potassium 05/23/2014 5.2* 3.5 - 5.1 mmol/L Final  . Chloride 05/23/2014 102  96 - 112 mmol/L Final  . BUN 05/23/2014 57* 6 - 23 mg/dL Final  . Creatinine, Ser 05/23/2014 3.10* 0.50 - 1.35 mg/dL Final  . Glucose, Bld 05/23/2014 86  70 - 99 mg/dL Final  . Calcium, Ion 05/23/2014 1.19  1.12 -  1.23 mmol/L Final  . TCO2 05/23/2014 22  0 - 100 mmol/L Final  . Hemoglobin 05/23/2014 14.6  13.0 - 17.0 g/dL Final  . HCT 05/23/2014 43.0  39.0 - 52.0 % Final  . Squamous Epithelial / LPF 05/23/2014 RARE  RARE Final  . WBC, UA 05/23/2014 0-2  <3 WBC/hpf Final  . RBC / HPF 05/23/2014 0-2  <3 RBC/hpf Final  . Bacteria, UA 05/23/2014 RARE  RARE Final    No results  found.   Assessment/Plan   ICD-9-CM ICD-10-CM   1. Essential hypertension 401.9 I10   2. H/O: stroke with residual effects 438.9 I69.30   3. Major depression, recurrent, chronic (HCC) 296.30 F33.9   4. Controlled type 2 diabetes mellitus with complication, without long-term current use of insulin (HCC) 250.90 E11.8   5. 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 PVD (peripheral vascular disease) (HCC) 443.9 I73.9   7. Status post above knee amputation of left lower extremity ([place]) V49.76 Z89.612   8. CKD (chronic kidney disease) stage 3, GFR 30-59 ml/min 585.3 N18.3   9. Dementia without behavioral disturbance 294.20 F03.90     Pt is medically stable on current tx plan. Continue current medications as ordered. PT/OT/ST as indicated. Will follow  Fumiye Lubben S. Perlie Gold  [facility] and Adult Medicine [address] [place], [place] [address] [phone] Cell (Monday-Friday 8 AM - 5 PM) [phone] After 5 PM and follow prompts

## 2015-03-19 LAB — MICROALBUMIN, URINE: MICROALB UR: 0.6

## 2015-03-25 ENCOUNTER — Encounter: Payer: Self-pay | Admitting: Internal Medicine

## 2015-03-25 ENCOUNTER — Non-Acute Institutional Stay (SKILLED_NURSING_FACILITY): Payer: Medicare Other | Admitting: Internal Medicine

## 2015-03-25 DIAGNOSIS — I5042 Chronic combined systolic (congestive) and diastolic (congestive) heart failure: Secondary | ICD-10-CM

## 2015-03-25 DIAGNOSIS — E118 Type 2 diabetes mellitus with unspecified complications: Secondary | ICD-10-CM | POA: Diagnosis not present

## 2015-03-25 DIAGNOSIS — I10 Essential (primary) hypertension: Secondary | ICD-10-CM

## 2015-03-25 DIAGNOSIS — I6930 Unspecified sequelae of cerebral infarction: Secondary | ICD-10-CM

## 2015-03-25 DIAGNOSIS — Z89612 Acquired absence of left leg above knee: Secondary | ICD-10-CM | POA: Diagnosis not present

## 2015-03-25 DIAGNOSIS — N400 Enlarged prostate without lower urinary tract symptoms: Secondary | ICD-10-CM

## 2015-03-25 DIAGNOSIS — I739 Peripheral vascular disease, unspecified: Secondary | ICD-10-CM

## 2015-03-25 DIAGNOSIS — F339 Major depressive disorder, recurrent, unspecified: Secondary | ICD-10-CM | POA: Diagnosis not present

## 2015-03-25 DIAGNOSIS — E785 Hyperlipidemia, unspecified: Secondary | ICD-10-CM | POA: Diagnosis not present

## 2015-03-25 DIAGNOSIS — F0390 Unspecified dementia without behavioral disturbance: Secondary | ICD-10-CM | POA: Diagnosis not present

## 2015-03-25 NOTE — Progress Notes (Signed)
Patient ID: Jeremy Howell, male   DOB: 1956-01-07, 60 y.o.   MRN: [id number]    DATE: 03/25/15  Location:  [facility] Room Number: 121 B Place of Service: SNF (31)   Extended Emergency Contact Information Primary Emergency Contact: [facility] Address: [address]          [place], [place] [address] Johnnette Litter of [place] Phone: [phone] Work Phone: [phone] Mobile Phone: [phone] Relation: Brother Secondary Emergency Contact: Lelon Huh Address: [id number]          [place], [place] [address] [place] Phone: [phone] Mobile Phone: [phone] Relation: Daughter  Advanced Directive information Does patient have an advance directive?: No, Would patient like information on creating an advanced directive?: No - patient declined information  Chief Complaint  Patient presents with  . Medical Management of Chronic Issues    Routine Visit; [facility]    HPI:  60 yo male long term resident seen today for f/u. He has no c/o. No nursing issues. No falls. Appetite ok. He is a poor historian due to dementia. hx obtained from chart  CHF/ CM - currently euvolemic on coreg and lasix  DM - CBGs not checked on regular basis. A1c 5.6% in Dec 2016  Elevated NH3 level/encephalopathy - stable. He no longer takes lactulose  PAD/hyperlipidemia/hx CVA - takes statin and ASA daily. He takes lamictal daily  HTN - BP controlled on coreg  Depression - mood stable on celexa.   BPH - stable on proscar and flomax  He takes vitamins and minerals daily  Past Medical History  Diagnosis Date  . Cardiomyopathy     EF 20-25% by echo 01/2010 with severe LVH felt secondary to substance abuse (no ischemic workup)  . Diabetes mellitus   . HTN (hypertension)   . Polysubstance abuse     Reported hx of cocaine, THC, heroin, EtOH abuse  . Noncompliance   . Peripheral vascular disease ([place])   . CHF (congestive heart failure)  ([place])   . Pneumonia   . Dependent for walking   . Wheelchair bound   . Acute renal failure ([place])   . Gangrene of foot ([place])   . Streptococcal pneumonia ([place])   . 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 Hyperlipidemia   . Anemia   . Urinary retention   . Hyperkalemia   . H/O: substance abuse     cocaine, heroin, ETOH, marijuana    Past Surgical History  Procedure Laterality Date  . Total hip arthroplasty Bilateral   . Amputation  08/15/2011    Procedure: AMPUTATION ABOVE KNEE;  Surgeon: Elam Dutch, MD;  Location: [place];  Service: Vascular;  Laterality: Left;  Amputation End:  [id number]  . Femoral-tibial bypass graft  12/23/2011    Procedure: BYPASS GRAFT FEMORAL-TIBIAL ARTERY;  Surgeon: Elam Dutch, MD;  Location: [facility] OR;  Service: Vascular;  Laterality: Right;  Right Popliteal - Tibial Artery Bypass using Reversed Saphenous vein  . Abdominal aortagram N/A 10/16/2011    Procedure: ABDOMINAL Maxcine Ham;  Surgeon: Elam Dutch, MD;  Location: [facility] CATH LAB;  Service: Cardiovascular;  Laterality: N/A;  . Lower extremity angiogram N/A 02/12/2012    Procedure: LOWER EXTREMITY ANGIOGRAM;  Surgeon: Elam Dutch, MD;  Location: [facility] CATH LAB;  Service: Cardiovascular;  Laterality: N/A;  . Abdominal angiogram  02/12/2012  Procedure: ABDOMINAL ANGIOGRAM;  Surgeon: Elam Dutch, MD;  Location: [facility] CATH LAB;  Service: Cardiovascular;;  . Percutaneous stent intervention  02/12/2012    Procedure: PERCUTANEOUS STENT INTERVENTION;  Surgeon: Elam Dutch, MD;  Location: [facility] CATH LAB;  Service: Cardiovascular;;  . Abdominal aortagram N/A 10/14/2012    Procedure: ABDOMINAL Maxcine Ham;  Surgeon: Elam Dutch, MD;  Location: [facility] CATH LAB;  Service: Cardiovascular;  Laterality: N/A;    Patient Care Team: Hennie Duos, MD as PCP - General (Internal Medicine)  Social History   Social History  . Marital Status: Single      Spouse Name: N/A  . Number of Children: 4  . Years of Education: N/A   Occupational History  . disabled    Social History Main Topics  . Smoking status: Former Smoker -- 15 years    Types: Cigarettes    Quit date: 08/10/2011  . Smokeless tobacco: Never Used     Comment: declined  . Alcohol Use: No  . Drug Use: No     Comment: + previous history  . Sexual Activity: Not on file   Other Topics Concern  . Not on file   Social History Narrative   Lives with sister      reports that he quit smoking about 3 years ago. His smoking use included Cigarettes. He quit after 15 years of use. He has never used smokeless tobacco. He reports that he does not drink alcohol or use illicit drugs.  Immunization History  Administered Date(s) Administered  . Influenza Split 10/24/2011  . Influenza-Unspecified 11/09/2012, 11/21/2014  . PPD Test 07/23/2011, 08/19/2011  . Pneumococcal Polysaccharide-23 10/24/2011  . Pneumococcal-Unspecified 08/24/2011, 09/12/2012    Allergies  Allergen Reactions  . Almond Oil Shortness Of Breath and Swelling    Medications: Patient's Medications  New Prescriptions   No medications on file  Previous Medications   ACETAMINOPHEN (TYLENOL) 325 MG TABLET    Take 650 mg by mouth every 6 (six) hours as needed.   ASPIRIN 325 MG TABLET    Take 325 mg by mouth daily.   ATORVASTATIN CALCIUM PO    Give 1 tablet by mouth every morning ( 40 mg )   BISACODYL (DULCOLAX) 10 MG SUPPOSITORY    Place 10 mg rectally as needed for moderate constipation.   CARVEDILOL (COREG) 25 MG TABLET    Take 25 mg by mouth daily.    CITALOPRAM (CELEXA) 10 MG TABLET    Take 10 mg by mouth daily.   DOCUSATE SODIUM (COLACE) 100 MG CAPSULE    Take 100 mg by mouth 2 (two) times daily.   FERROUS SULFATE 325 (65 FE) MG TABLET    Take 325 mg by mouth 3 (three) times daily with meals.    FINASTERIDE (PROSCAR) 5 MG TABLET    Take 5 mg by mouth daily.   FUROSEMIDE (LASIX) 20 MG TABLET    Take 20  mg by mouth daily.   LAMOTRIGINE 50 MG TBDP    1 tablet by mouth once daily   MAGNESIUM HYDROXIDE (MILK OF MAGNESIA) 400 MG/5ML SUSPENSION    Take by mouth daily as needed for mild constipation.   OMEPRAZOLE (PRILOSEC) 40 MG CAPSULE    Take 40 mg by mouth daily.   POLYETHYLENE GLYCOL (MIRALAX / GLYCOLAX) PACKET    Take 17 g by mouth daily.   SACCHAROMYCES BOULARDII (FLORASTOR) 250 MG CAPSULE    Take 250 mg by mouth 2 (two) times daily.  TAMSULOSIN (FLOMAX) 0.4 MG CAPS CAPSULE    Take 1 capsule (0.4 mg total) by mouth daily after breakfast.   VITAMIN C (ASCORBIC ACID) 500 MG TABLET    Take 500 mg by mouth daily. To be given with iron   VITAMIN D, ERGOCALCIFEROL, (DRISDOL) 50000 UNITS CAPS CAPSULE    Take 50,000 Units by mouth every 30 (thirty) days. 14th of the month  Modified Medications   No medications on file  Discontinued Medications   CALCIUM CARBONATE-SIMETHICONE (MAALOX MAX PO)    Give 30 cc by mouth every 2 hours PRN   LACTULOSE (CHRONULAC) 10 GM/15ML SOLUTION    Give 30 ml by mouth twice daily    Review of Systems  Unable to perform ROS: Dementia    Filed Vitals:   03/25/15 1322  BP: 142/89  Pulse: 61  Temp: 98 F (36.7 C)  TempSrc: Oral  Resp: 18  Height: 5\' 7"  (1.702 m)  Weight: 148 lb (67.132 kg)   Body mass index is 23.17 kg/(m^2).  Physical Exam  Constitutional: He appears well-developed.  Lying in bed in NAD. Frail appearing  HENT:  Mouth/Throat: Oropharynx is clear and moist.  Eyes: Pupils are equal, round, and reactive to light. No scleral icterus.  Neck: Neck supple. Carotid bruit is not present.  Cardiovascular: Normal rate, regular rhythm and intact distal pulses.  Exam reveals no gallop and no friction rub.   Murmur (1/6 SEM) heard. Pulses:      Dorsalis pedis pulses are 1+ on the right side.       Posterior tibial pulses are 1+ on the right side.  No RLE edema. No right calf TTP. Left AKA  Pulmonary/Chest: Effort normal and breath sounds normal. He  has no wheezes. He has no rales. He exhibits no tenderness.  Abdominal: Soft. Bowel sounds are normal. He exhibits no distension, no abdominal bruit, no pulsatile midline mass and no mass. There is no tenderness. There is no rebound and no guarding.  Musculoskeletal:  Left AKA  Lymphadenopathy:    He has no cervical adenopathy.  Neurological: He is alert.  Expressive aphasia present  Skin: Skin is warm and dry. No rash noted.  Psychiatric: He has a normal mood and affect. His behavior is normal.     Labs reviewed: Nursing Home on 03/25/2015  Component Date Value Ref Range Status  . Hemoglobin 01/16/2015 13.9  13.5 - 17.5 g/dL Final  . HCT 01/16/2015 43  41 - 53 % Final  . Platelets 01/16/2015 318  150 - 399 K/L Final  . WBC 01/16/2015 4.6   Final  . Glucose 01/16/2015 106   Final  . BUN 01/16/2015 19  4 - 21 mg/dL Final  . Creatinine 01/16/2015 1.1  0.6 - 1.3 mg/dL Final  . Potassium 01/16/2015 4.0  3.4 - 5.3 mmol/L Final  . Sodium 01/16/2015 141  137 - 147 mmol/L Final  . Triglycerides 01/16/2015 173* 40 - 160 mg/dL Final  . Cholesterol 01/16/2015 178  0 - 200 mg/dL Final  . HDL 01/16/2015 41  35 - 70 mg/dL Final  . LDL Cholesterol 01/16/2015 102   Final  . Alkaline Phosphatase 01/16/2015 72  25 - 125 U/L Final  . ALT 01/16/2015 35  10 - 40 U/L Final  . AST 01/16/2015 23  14 - 40 U/L Final  . Bilirubin, Total 01/16/2015 0.3   Final  . Hemoglobin A1C 01/16/2015 5.6   Final    No results found.   Assessment/Plan  ICD-9-CM ICD-10-CM   1. Dementia without behavioral disturbance 294.20 F03.90   2. H/O: stroke with residual effects 438.9 I69.30   3. Essential hypertension 401.9 I10   4. Major depression, recurrent, chronic (HCC) 296.30 F33.9   5. Controlled type 2 diabetes mellitus with complication, without long-term current use of insulin (HCC) 250.90 E11.8   6. PVD (peripheral vascular disease) (HCC) 443.9 I73.9   7. Chronic combined mod- severe systolic (EF 99991111) and  grade 1 diastolic CHF (congestive heart failure) 428.42 I50.42    428.0    8. Status post above knee amputation of left lower extremity ([place]) V49.76 Z89.612   9. Enlarged prostate without urinary obstruction 600.90 N40.0   10. Hyperlipidemia 272.4 E78.5     Cont current meds as ordered  PT/OT/ST as indicated  Watch salt intake  Follow BP readings  [facility] provider to follow  Will follow  Sarajean Dessert S. Perlie Gold  [facility] and Adult Medicine [address] [place], [place] [address] [phone] Cell (Monday-Friday 8 AM - 5 PM) [phone] After 5 PM and follow prompts

## 2015-04-01 LAB — HM DIABETES EYE EXAM

## 2015-04-16 LAB — CBC AND DIFFERENTIAL
HCT: 44 % (ref 41–53)
Hemoglobin: 14.5 g/dL (ref 13.5–17.5)
Platelets: 297 10*3/uL (ref 150–399)
WBC: 4.5 10^3/mL

## 2015-04-16 LAB — BASIC METABOLIC PANEL
BUN: 26 mg/dL — AB (ref 4–21)
Creatinine: 1.1 mg/dL (ref 0.6–1.3)
Glucose: 94 mg/dL
Potassium: 5 mmol/L (ref 3.4–5.3)
Sodium: 139 mmol/L (ref 137–147)

## 2015-04-16 LAB — HEMOGLOBIN A1C: Hemoglobin A1C: 6

## 2015-05-13 ENCOUNTER — Encounter: Payer: Self-pay | Admitting: Internal Medicine

## 2015-05-13 ENCOUNTER — Non-Acute Institutional Stay (SKILLED_NURSING_FACILITY): Payer: Medicare Other | Admitting: Internal Medicine

## 2015-05-13 DIAGNOSIS — E118 Type 2 diabetes mellitus with unspecified complications: Secondary | ICD-10-CM

## 2015-05-13 DIAGNOSIS — I10 Essential (primary) hypertension: Secondary | ICD-10-CM

## 2015-05-13 DIAGNOSIS — I69320 Aphasia following cerebral infarction: Secondary | ICD-10-CM

## 2015-05-13 DIAGNOSIS — I6930 Unspecified sequelae of cerebral infarction: Secondary | ICD-10-CM | POA: Diagnosis not present

## 2015-05-13 DIAGNOSIS — I739 Peripheral vascular disease, unspecified: Secondary | ICD-10-CM | POA: Diagnosis not present

## 2015-05-13 DIAGNOSIS — I69920 Aphasia following unspecified cerebrovascular disease: Secondary | ICD-10-CM | POA: Diagnosis not present

## 2015-05-13 DIAGNOSIS — I5042 Chronic combined systolic (congestive) and diastolic (congestive) heart failure: Secondary | ICD-10-CM

## 2015-05-13 DIAGNOSIS — Z89612 Acquired absence of left leg above knee: Secondary | ICD-10-CM | POA: Diagnosis not present

## 2015-05-13 DIAGNOSIS — E785 Hyperlipidemia, unspecified: Secondary | ICD-10-CM

## 2015-05-13 DIAGNOSIS — F0390 Unspecified dementia without behavioral disturbance: Secondary | ICD-10-CM

## 2015-05-13 DIAGNOSIS — F339 Major depressive disorder, recurrent, unspecified: Secondary | ICD-10-CM

## 2015-05-13 NOTE — Progress Notes (Signed)
Patient ID: Jeremy Howell, male   DOB: 1955-10-04, 60 y.o.   MRN: [id number]    DATE: 05/13/15  Location:  [facility] Room Number: 121 B Place of Service: SNF (31)   Extended Emergency Contact Information Primary Emergency Contact: [facility] Address: [address]          [place], [place] [address] Johnnette Litter of [place] Phone: [phone] Work Phone: [phone] Mobile Phone: [phone] Relation: Brother Secondary Emergency Contact: Lelon Huh Address: [id number]          [place], [place] [address] [place] Phone: [phone] Mobile Phone: [phone] Relation: Daughter  Advanced Directive information Does patient have an advance directive?: No, Would patient like information on creating an advanced directive?: No - patient declined information  Chief Complaint  Patient presents with  . Medical Management of Chronic Issues    Routine Visit; [facility]    HPI:  60 yo male long term resident seen today for f/u. He alleges that too much money is being taken from his acct to get his hair cut at the facility. S/w nursing and this is a constant c/o of his. He gets his hair cut 1/week  which costs more instead of monthly. He also c/o bed baths are rough on him and that his bed is not made ina timely fashion. Nursing aware. No nursing concerns. He is a poor historian due to dementia. Hx obtained from chart.  CHF/ CM - currently euvolemic on coreg and lasix  DM - CBGs not checked on regular basis. A1c 5.6% in Dec 2016  Elevated NH3 level/encephalopathy - stable. He no longer takes lactulose  PAD/hyperlipidemia/hx CVA - s/p left AKA. takes statin and ASA daily. He takes lamictal daily  HTN - BP controlled on coreg  Depression - mood stable on celexa.   BPH - stable on proscar and flomax  He takes vitamins and minerals daily  Past Medical History  Diagnosis Date  . Cardiomyopathy     EF 20-25% by echo  01/2010 with severe LVH felt secondary to substance abuse (no ischemic workup)  . Diabetes mellitus   . HTN (hypertension)   . Polysubstance abuse     Reported hx of cocaine, THC, heroin, EtOH abuse  . Noncompliance   . Peripheral vascular disease ([place])   . CHF (congestive heart failure) ([place])   . Pneumonia   . Dependent for walking   . Wheelchair bound   . Acute renal failure ([place])   . Gangrene of foot ([place])   . Streptococcal pneumonia ([place])   . Stroke 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 Hyperlipidemia   . Anemia   . Urinary retention   . Hyperkalemia   . H/O: substance abuse     cocaine, heroin, ETOH, marijuana    Past Surgical History  Procedure Laterality Date  . Total hip arthroplasty Bilateral   . Amputation  08/15/2011    Procedure: AMPUTATION ABOVE KNEE;  Surgeon: Elam Dutch, MD;  Location: [place];  Service: Vascular;  Laterality: Left;  Amputation End:  [id number]  . Femoral-tibial bypass graft  12/23/2011    Procedure: BYPASS GRAFT FEMORAL-TIBIAL ARTERY;  Surgeon: Elam Dutch, MD;  Location: [facility] OR;  Service: Vascular;  Laterality: Right;  Right Popliteal - Tibial Artery Bypass using Reversed Saphenous vein  . Abdominal aortagram N/A 10/16/2011  Procedure: ABDOMINAL AORTAGRAM;  Surgeon: Elam Dutch, MD;  Location: [facility] CATH LAB;  Service: Cardiovascular;  Laterality: N/A;  . Lower extremity angiogram N/A 02/12/2012    Procedure: LOWER EXTREMITY ANGIOGRAM;  Surgeon: Elam Dutch, MD;  Location: [facility] CATH LAB;  Service: Cardiovascular;  Laterality: N/A;  . Abdominal angiogram  02/12/2012    Procedure: ABDOMINAL ANGIOGRAM;  Surgeon: Elam Dutch, MD;  Location: [facility] CATH LAB;  Service: Cardiovascular;;  . Percutaneous stent intervention  02/12/2012    Procedure: PERCUTANEOUS STENT INTERVENTION;  Surgeon: Elam Dutch, MD;  Location: [facility] CATH LAB;  Service: Cardiovascular;;  . Abdominal aortagram  N/A 10/14/2012    Procedure: ABDOMINAL Maxcine Ham;  Surgeon: Elam Dutch, MD;  Location: [facility] CATH LAB;  Service: Cardiovascular;  Laterality: N/A;    Patient Care Team: Gildardo Cranker, DO as PCP - General (Internal Medicine)  Social History   Social History  . Marital Status: Single    Spouse Name: N/A  . Number of Children: 4  . Years of Education: N/A   Occupational History  . disabled    Social History Main Topics  . Smoking status: Former Smoker -- 15 years    Types: Cigarettes    Quit date: 08/10/2011  . Smokeless tobacco: Never Used     Comment: declined  . Alcohol Use: No  . Drug Use: No     Comment: + previous history  . Sexual Activity: Not on file   Other Topics Concern  . Not on file   Social History Narrative   Lives with sister      reports that he quit smoking about 3 years ago. His smoking use included Cigarettes. He quit after 15 years of use. He has never used smokeless tobacco. He reports that he does not drink alcohol or use illicit drugs.  Immunization History  Administered Date(s) Administered  . Influenza Split 10/24/2011  . Influenza-Unspecified 11/09/2012, 11/21/2014  . PPD Test 07/23/2011, 08/19/2011  . Pneumococcal Polysaccharide-23 10/24/2011  . Pneumococcal-Unspecified 08/24/2011, 09/12/2012    Allergies  Allergen Reactions  . Almond Oil Shortness Of Breath and Swelling    Medications: Patient's Medications  New Prescriptions   No medications on file  Previous Medications   ACETAMINOPHEN (TYLENOL) 325 MG TABLET    Take 650 mg by mouth every 6 (six) hours as needed.   ALUM & MAG HYDROXIDE-SIMETH (GERI-LANTA PO)    Give 30 cc by mouth every 2 hours PRN for indigestion x 24 hours. Call MD if indigestion is not relieved within 24 hours.   ASPIRIN 325 MG TABLET    Take 325 mg by mouth daily.   ATORVASTATIN CALCIUM PO    Give 1 tablet by mouth every morning ( 40 mg )   BISACODYL (DULCOLAX) 10 MG SUPPOSITORY    Place 10 mg rectally as  needed for moderate constipation.   CARVEDILOL (COREG) 25 MG TABLET    Take 25 mg by mouth daily.    CITALOPRAM (CELEXA) 10 MG TABLET    Take 10 mg by mouth daily.   DOCUSATE SODIUM (COLACE) 100 MG CAPSULE    Take 100 mg by mouth 2 (two) times daily.   FERROUS SULFATE 325 (65 FE) MG TABLET    Take 325 mg by mouth 3 (three) times daily with meals.    FINASTERIDE (PROSCAR) 5 MG TABLET    Take 5 mg by mouth daily.   FUROSEMIDE (LASIX) 20 MG TABLET    Take 20 mg by mouth  daily.   LAMOTRIGINE 50 MG TBDP    1 tablet by mouth once daily   MAGNESIUM HYDROXIDE (MILK OF MAGNESIA) 400 MG/5ML SUSPENSION    Take by mouth daily as needed for mild constipation.   OMEPRAZOLE (PRILOSEC) 40 MG CAPSULE    Take 40 mg by mouth daily.   POLYETHYL GLYCOL-PROPYL GLYCOL (SYSTANE OP)    Place 2 drops into both eyes daily as needed (No frequency limitations.).   POLYETHYLENE GLYCOL (MIRALAX / GLYCOLAX) PACKET    Take 17 g by mouth daily.   SACCHAROMYCES BOULARDII (FLORASTOR) 250 MG CAPSULE    Take 250 mg by mouth 2 (two) times daily.   TAMSULOSIN (FLOMAX) 0.4 MG CAPS CAPSULE    Take 1 capsule (0.4 mg total) by mouth daily after breakfast.   VITAMIN C (ASCORBIC ACID) 500 MG TABLET    Take 500 mg by mouth daily. To be given with iron   VITAMIN D, ERGOCALCIFEROL, (DRISDOL) 50000 UNITS CAPS CAPSULE    Take 50,000 Units by mouth every 30 (thirty) days. 14th of the month  Modified Medications   No medications on file  Discontinued Medications   No medications on file    Review of Systems  Unable to perform ROS: Dementia    Filed Vitals:   05/13/15 1524  BP: 134/86  Pulse: 74  Temp: 96.7 F (35.9 C)  TempSrc: Oral  Resp: 19  Height: 5\' 7"  (1.702 m)  Weight: 148 lb 12.8 oz (67.495 kg)  SpO2: 97%   Body mass index is 23.3 kg/(m^2).  Physical Exam  Constitutional: He appears well-developed.  Sitting in w/c in NAD. Frail appearing  HENT:  Mouth/Throat: Oropharynx is clear and moist.  Eyes: Pupils are equal,  round, and reactive to light. No scleral icterus.  Neck: Neck supple. Carotid bruit is not present.  Cardiovascular: Normal rate, regular rhythm and intact distal pulses.  Exam reveals no gallop and no friction rub.   Murmur (1/6 SEM) heard. Pulses:      Dorsalis pedis pulses are 1+ on the right side.       Posterior tibial pulses are 1+ on the right side.  No RLE edema. No right calf TTP. Left AKA  Pulmonary/Chest: Effort normal and breath sounds normal. He has no wheezes. He has no rales. He exhibits no tenderness.  Abdominal: Soft. Bowel sounds are normal. He exhibits no distension, no abdominal bruit, no pulsatile midline mass and no mass. There is no tenderness. There is no rebound and no guarding.  Musculoskeletal:  Left AKA; RUE flexion contracture  Lymphadenopathy:    He has no cervical adenopathy.  Neurological: He is alert.  Expressive aphasia present  Skin: Skin is warm and dry. No rash noted.  Psychiatric: He has a normal mood and affect. His behavior is normal.     Labs reviewed: Nursing Home on 05/13/2015  Component Date Value Ref Range Status  . HM Diabetic Eye Exam 04/01/2015 No Retinopathy  No Retinopathy Final  . Hemoglobin 04/16/2015 14.5  13.5 - 17.5 g/dL Final  . HCT 04/16/2015 44  41 - 53 % Final  . Platelets 04/16/2015 297  150 - 399 K/L Final  . WBC 04/16/2015 4.5   Final  . Glucose 04/16/2015 94   Final  . BUN 04/16/2015 26* 4 - 21 mg/dL Final  . Creatinine 04/16/2015 1.1  0.6 - 1.3 mg/dL Final  . Potassium 04/16/2015 5.0  3.4 - 5.3 mmol/L Final  . Sodium 04/16/2015 139  137 -  147 mmol/L Final  . Hemoglobin A1C 04/16/2015 6.0   Final  . Microalb, Ur 03/19/2015 0.6   Final  Nursing Home on 03/25/2015  Component Date Value Ref Range Status  . Hemoglobin 01/16/2015 13.9  13.5 - 17.5 g/dL Final  . HCT 01/16/2015 43  41 - 53 % Final  . Platelets 01/16/2015 318  150 - 399 K/L Final  . WBC 01/16/2015 4.6   Final  . Glucose 01/16/2015 106   Final  . BUN  01/16/2015 19  4 - 21 mg/dL Final  . Creatinine 01/16/2015 1.1  0.6 - 1.3 mg/dL Final  . Potassium 01/16/2015 4.0  3.4 - 5.3 mmol/L Final  . Sodium 01/16/2015 141  137 - 147 mmol/L Final  . Triglycerides 01/16/2015 173* 40 - 160 mg/dL Final  . Cholesterol 01/16/2015 178  0 - 200 mg/dL Final  . HDL 01/16/2015 41  35 - 70 mg/dL Final  . LDL Cholesterol 01/16/2015 102   Final  . Alkaline Phosphatase 01/16/2015 72  25 - 125 U/L Final  . ALT 01/16/2015 35  10 - 40 U/L Final  . AST 01/16/2015 23  14 - 40 U/L Final  . Bilirubin, Total 01/16/2015 0.3   Final  . Hemoglobin A1C 01/16/2015 5.6   Final    No results found.   Assessment/Plan    ICD-9-CM ICD-10-CM   1. Dementia without behavioral disturbance 294.20 F03.90   2. Aphasia as late effect of cerebrovascular accident 438.11 I69.920   3. Essential hypertension 401.9 I10   4. Major depression, recurrent, chronic (HCC) 296.30 F33.9   5. Controlled type 2 diabetes mellitus with complication, without long-term current use of insulin (HCC) 250.90 E11.8   6. PVD (peripheral vascular disease) (HCC) 443.9 I73.9   7. Status post above knee amputation of left lower extremity ([place]) V49.76 Z89.612   8. 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 Hyperlipidemia 272.4 E78.5   10. H/O: stroke with residual effects 438.9 I69.30    [facility] NP to follow  Pt is medically stable on current tx plan. Continue current medications as ordered. PT/OT/ST as indicated. Will follow  Poetry Cerro S. Perlie Gold  [facility] [address] [place], [place] [address] [phone] Cell (Monday-Friday 8 AM - 5 PM) [phone] After 5 PM and follow prompts

## 2015-05-27 ENCOUNTER — Ambulatory Visit (INDEPENDENT_AMBULATORY_CARE_PROVIDER_SITE_OTHER): Payer: Medicare Other | Admitting: Internal Medicine

## 2015-05-27 ENCOUNTER — Encounter: Payer: Self-pay | Admitting: Internal Medicine

## 2015-05-27 VITALS — BP 138/86 | HR 54 | Ht 67.0 in | Wt 148.0 lb

## 2015-05-27 DIAGNOSIS — K5909 Other constipation: Secondary | ICD-10-CM

## 2015-05-27 DIAGNOSIS — R195 Other fecal abnormalities: Secondary | ICD-10-CM

## 2015-05-27 DIAGNOSIS — K5900 Constipation, unspecified: Secondary | ICD-10-CM

## 2015-05-27 MED ORDER — NA SULFATE-K SULFATE-MG SULF 17.5-3.13-1.6 GM/177ML PO SOLN: ORAL | Status: DC

## 2015-05-27 NOTE — Patient Instructions (Signed)

## 2015-05-27 NOTE — Progress Notes (Signed)
   Subjective:    Patient ID: Jeremy Howell, male    DOB: 1955/03/12, 60 y.o.   MRN: [id number]  HPI Mr. Rutt is a 60 year old male with a past medical history of cardiomyopathy, diabetes, hypertension, history of polysubstance abuse, peripheral vascular disease status post right BKA, history of CVA with right hemiparesis who is seen for follow-up to evaluate heme positive stool. He was last seen in the office on 05/03/2013 to discuss colorectal cancer screening and constipation. After this visit Cologuard was recommended, performed, and negative. He is here today with his sister-in-law. He denies complaint from a GI perspective other than intermittent constipation. He has been using MiraLAX 17 g per day with good result. He is also on probiotic. He is also using Colace 100 mg twice daily. With this he denies constipation. He rarely requires Dulcolax suppository. He denies trouble swallowing or heartburn. Denies abdominal pain. Denies seeing visible blood in his stool or melena.  Labs reviewed [facility]. On 02/21/2015 he had a heme positive stool. Another stool collected one day later was also positive  Review of Systems As per history of present illness, otherwise negative  Current Medications, Allergies, Past Medical History, Past Surgical History, Family History and Social History were reviewed in [employer] record.     Objective:   Physical Exam BP 138/86 mmHg  Pulse 54  Ht 5\' 7"  (1.702 m)  Wt 148 lb (67.132 kg)  BMI 23.17 kg/m2 Constitutional: Chronically ill-appearing male sitting in wheelchair in no acute distress HEENT: Normocephalic and atraumatic. Oropharynx is clear and moist. No oropharyngeal exudate. Conjunctivae are normal.  No scleral icterus. Neck: Neck supple. Trachea midline. Cardiovascular: Normal rate, regular rhythm and intact distal pulses.  Pulmonary/chest: Effort normal and breath sounds normal. No wheezing, rales  or rhonchi. Abdominal: Soft, nontender, nondistended. Bowel sounds active throughout.  Extremities: no clubbing, cyanosis, right BKA, right upper extremity contracture Neurological: Mild dysarthria, limited mobility right upper extremity/hand Skin: Skin is warm and dry.  Psychiatric: Normal mood and affect. Behavior is normal.  Labs from [facility] reviewed and sent to be scanned to EMR    Assessment & Plan:  60 year old male with a past medical history of cardiomyopathy, diabetes, hypertension, history of polysubstance abuse, peripheral vascular disease status post right BKA, history of CVA with right hemiparesis who is seen for follow-up to evaluate heme positive stool.   1. Heme positive stool -- the negative Cologuard from 2015 is reassuring though with heme positive stool have recommended colonoscopy. This will need to be done in the outpatient hospital setting due to his cardiomyopathy and limited mobility. We discussed the risks, benefits and alternatives with both the patient and his sister-in-law who both voiced understanding and agreed to proceed.  2. Chronic constipation -- continue MiraLAX and Colace with as needed Dulcolax suppository  25 minutes spent with the patient today. Greater than 50% was spent in counseling and coordination of care with the patient

## 2015-05-27 NOTE — Telephone Encounter (Signed)
Error

## 2015-06-13 ENCOUNTER — Non-Acute Institutional Stay (SKILLED_NURSING_FACILITY): Payer: Medicare Other | Admitting: Internal Medicine

## 2015-06-13 ENCOUNTER — Encounter: Payer: Self-pay | Admitting: Internal Medicine

## 2015-06-13 DIAGNOSIS — I69920 Aphasia following unspecified cerebrovascular disease: Secondary | ICD-10-CM | POA: Diagnosis not present

## 2015-06-13 DIAGNOSIS — N400 Enlarged prostate without lower urinary tract symptoms: Secondary | ICD-10-CM

## 2015-06-13 DIAGNOSIS — I5042 Chronic combined systolic (congestive) and diastolic (congestive) heart failure: Secondary | ICD-10-CM

## 2015-06-13 DIAGNOSIS — F339 Major depressive disorder, recurrent, unspecified: Secondary | ICD-10-CM

## 2015-06-13 DIAGNOSIS — E118 Type 2 diabetes mellitus with unspecified complications: Secondary | ICD-10-CM

## 2015-06-13 DIAGNOSIS — I10 Essential (primary) hypertension: Secondary | ICD-10-CM

## 2015-06-13 DIAGNOSIS — F0390 Unspecified dementia without behavioral disturbance: Secondary | ICD-10-CM | POA: Diagnosis not present

## 2015-06-13 DIAGNOSIS — I69320 Aphasia following cerebral infarction: Secondary | ICD-10-CM

## 2015-06-13 DIAGNOSIS — I6930 Unspecified sequelae of cerebral infarction: Secondary | ICD-10-CM

## 2015-06-13 DIAGNOSIS — Z89612 Acquired absence of left leg above knee: Secondary | ICD-10-CM | POA: Diagnosis not present

## 2015-06-13 DIAGNOSIS — E785 Hyperlipidemia, unspecified: Secondary | ICD-10-CM | POA: Diagnosis not present

## 2015-06-13 NOTE — Progress Notes (Signed)
Patient ID: Jeremy Howell, male   DOB: 02-10-1955, 60 y.o.   MRN: [id number]    DATE:06/13/15  Location:  [facility]    Place of Service: [id number] [phone])   Extended Emergency Contact Information Primary Emergency Contact: [facility] Address: [address]          [place], [place] [address] Johnnette Litter of [place] Phone: [phone] Work Phone: [phone] Mobile Phone: [phone] Relation: Brother Secondary Emergency Contact: Lelon Huh Address: [id number]          [place], [place] [address] [place] of [place] Phone: [phone] Mobile Phone: [phone] Relation: Daughter  Advanced Directive information  [place]  Chief Complaint  Patient presents with  . Medical Management of Chronic Issues    [facility]    HPI:  60 yo male long term resident seen today for f/u. He reports that he was able to straighten out money concerns. He got a haircut today. No nursing issues. No falls. He is a poor historian due to dementia. Hx obtained from chart  CHF/ CM - currently euvolemic on coreg and lasix  DM - CBGs not checked on regular basis. A1c 5.6%. Urine microalbumin  0.6  Elevated NH3 level/encephalopathy - stable. He no longer takes lactulose  PAD/hyperlipidemia/hx CVA - s/p left AKA. takes statin and ASA daily. He takes lamictal daily  HTN - BP stable on coreg  Depression/dementia - mood stable on celexa.   BPH - stable on proscar and flomax  He takes vitamins and minerals daily  Past Medical History  Diagnosis Date  . Cardiomyopathy     EF 20-25% by echo 01/2010 with severe LVH felt secondary to substance abuse (no ischemic workup)  . Diabetes mellitus   . HTN (hypertension)   . Polysubstance abuse     Reported hx of cocaine, THC, heroin, EtOH abuse  . Noncompliance   . Peripheral vascular disease ([place])   . CHF (congestive heart failure) ([place])   . Pneumonia   . Dependent for walking   . Wheelchair bound   . Acute renal  failure ([place])   . Gangrene of foot ([place])   . Streptococcal pneumonia ([place])   . 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 Hyperlipidemia   . Anemia   . Urinary retention   . Hyperkalemia   . H/O: substance abuse     cocaine, heroin, ETOH, marijuana    Past Surgical History  Procedure Laterality Date  . Total hip arthroplasty Bilateral   . Amputation  08/15/2011    Procedure: AMPUTATION ABOVE KNEE;  Surgeon: Elam Dutch, MD;  Location: [place];  Service: Vascular;  Laterality: Left;  Amputation End:  [id number]  . Femoral-tibial bypass graft  12/23/2011    Procedure: BYPASS GRAFT FEMORAL-TIBIAL ARTERY;  Surgeon: Elam Dutch, MD;  Location: [facility] OR;  Service: Vascular;  Laterality: Right;  Right Popliteal - Tibial Artery Bypass using Reversed Saphenous vein  . Abdominal aortagram N/A 10/16/2011    Procedure: ABDOMINAL Maxcine Ham;  Surgeon: Elam Dutch, MD;  Location: [facility] CATH LAB;  Service: Cardiovascular;  Laterality: N/A;  . Lower extremity angiogram N/A 02/12/2012    Procedure: LOWER EXTREMITY ANGIOGRAM;  Surgeon: Elam Dutch, MD;  Location: [facility] C[facility] LAB;  Service: Cardiovascular;  Laterality: N/A;  . Abdominal angiogram  02/12/2012    Procedure: ABDOMINAL ANGIOGRAM;  Surgeon: Elam Dutch, MD;  Location: [place] CATH LAB;  Service: Cardiovascular;;  . Percutaneous stent intervention  02/12/2012    Procedure: PERCUTANEOUS STENT INTERVENTION;  Surgeon: Elam Dutch, MD;  Location: [facility] CATH LAB;  Service: Cardiovascular;;  . Abdominal aortagram N/A 10/14/2012    Procedure: ABDOMINAL Maxcine Ham;  Surgeon: Elam Dutch, MD;  Location: [facility] CATH LAB;  Service: Cardiovascular;  Laterality: N/A;    Patient Care Team: Gildardo Cranker, DO as PCP - General (Internal Medicine)  Social History   Social History  . Marital Status: Single    Spouse Name: N/A  . Number of Children: 4  . Years of Education: N/A    Occupational History  . disabled    Social History Main Topics  . Smoking status: Former Smoker -- 15 years    Types: Cigarettes    Quit date: 08/10/2011  . Smokeless tobacco: Never Used     Comment: declined  . Alcohol Use: No  . Drug Use: No     Comment: + previous history  . Sexual Activity: Not on file   Other Topics Concern  . Not on file   Social History Narrative   Lives with sister      reports that he quit smoking about 3 years ago. His smoking use included Cigarettes. He quit after 15 years of use. He has never used smokeless tobacco. He reports that he does not drink alcohol or use illicit drugs.  Immunization History  Administered Date(s) Administered  . Influenza Split 10/24/2011  . Influenza-Unspecified 11/09/2012, 11/21/2014  . PPD Test 07/23/2011, 08/19/2011  . Pneumococcal Polysaccharide-23 10/24/2011  . Pneumococcal-Unspecified 08/24/2011, 09/12/2012    Allergies  Allergen Reactions  . Almond Oil Shortness Of Breath and Swelling    Medications: Patient's Medications  New Prescriptions   No medications on file  Previous Medications   ACETAMINOPHEN (TYLENOL) 325 MG TABLET    Take 650 mg by mouth every 6 (six) hours as needed.   ALUM & MAG HYDROXIDE-SIMETH (GERI-LANTA PO)    Give 30 cc by mouth every 2 hours PRN for indigestion x 24 hours. Call MD if indigestion is not relieved within 24 hours.   ASPIRIN 325 MG TABLET    Take 325 mg by mouth daily.   ATORVASTATIN CALCIUM PO    Give 1 tablet by mouth every morning ( 40 mg )   BISACODYL (DULCOLAX) 10 MG SUPPOSITORY    Place 10 mg rectally as needed for moderate constipation.   CARVEDILOL (COREG) 25 MG TABLET    Take 25 mg by mouth daily.    CITALOPRAM (CELEXA) 10 MG TABLET    Take 10 mg by mouth daily.   DOCUSATE SODIUM (COLACE) 100 MG CAPSULE    Take 100 mg by mouth 2 (two) times daily.   FERROUS SULFATE 325 (65 FE) MG TABLET    Take 325 mg by mouth 3 (three) times daily with meals.    FINASTERIDE  (PROSCAR) 5 MG TABLET    Take 5 mg by mouth daily.   FUROSEMIDE (LASIX) 20 MG TABLET    Take 20 mg by mouth daily.   LAMOTRIGINE 50 MG TBDP    1 tablet by mouth once daily   MAGNESIUM HYDROXIDE (MILK OF MAGNESIA) 400 MG/5ML SUSPENSION    Take by mouth daily as needed for mild constipation.   NA SULFATE-K SULFATE-MG SULF SOLN    Suprep-Use as directed   OMEPRAZOLE (PRILOSEC) 40 MG CAPSULE    Take 40 mg by mouth daily.   POLYETHYL  GLYCOL-PROPYL GLYCOL (SYSTANE OP)    Place 2 drops into both eyes daily as needed (No frequency limitations.).   POLYETHYLENE GLYCOL (MIRALAX / GLYCOLAX) PACKET    Take 17 g by mouth daily.   SACCHAROMYCES BOULARDII (FLORASTOR) 250 MG CAPSULE    Take 250 mg by mouth 2 (two) times daily.   TAMSULOSIN (FLOMAX) 0.4 MG CAPS CAPSULE    Take 1 capsule (0.4 mg total) by mouth daily after breakfast.   VITAMIN C (ASCORBIC ACID) 500 MG TABLET    Take 500 mg by mouth daily. To be given with iron   VITAMIN D, ERGOCALCIFEROL, (DRISDOL) 50000 UNITS CAPS CAPSULE    Take 50,000 Units by mouth every 30 (thirty) days. 14th of the month  Modified Medications   No medications on file  Discontinued Medications   No medications on file    Review of Systems  Unable to perform ROS: Dementia    Filed Vitals:   06/13/15 1602  BP: 142/89  Pulse: 70  Temp: 97.1 F (36.2 C)  Weight: 148 lb 6.4 oz (67.314 kg)   Body mass index is 23.24 kg/(m^2).  Physical Exam  Constitutional: He appears well-developed.  Sitting in bed in NAD. Frail appearing  HENT:  Mouth/Throat: Oropharynx is clear and moist.  Eyes: Pupils are equal, round, and reactive to light. No scleral icterus.  Neck: Neck supple. Carotid bruit is not present.  Cardiovascular: Normal rate, regular rhythm and intact distal pulses.  Exam reveals no gallop and no friction rub.   Murmur (1/6 SEM) heard. Pulses:      Dorsalis pedis pulses are 1+ on the right side.       Posterior tibial pulses are 1+ on the right side.  +1  pitting RLE edema. No right calf TTP. Left AKA  Pulmonary/Chest: Effort normal and breath sounds normal. He has no wheezes. He has no rales. He exhibits no tenderness.  Abdominal: Soft. Bowel sounds are normal. He exhibits no distension, no abdominal bruit, no pulsatile midline mass and no mass. There is no tenderness. There is no rebound and no guarding.  Musculoskeletal:  Left AKA; RUE flexion contracture  Lymphadenopathy:    He has no cervical adenopathy.  Neurological: He is alert.  Expressive aphasia present  Skin: Skin is warm and dry. No rash noted.  Psychiatric: He has a normal mood and affect. His behavior is normal.     Labs reviewed: Nursing Home on 05/13/2015  Component Date Value Ref Range Status  . HM Diabetic Eye Exam 04/01/2015 No Retinopathy  No Retinopathy Final  . Hemoglobin 04/16/2015 14.5  13.5 - 17.5 g/dL Final  . HCT 04/16/2015 44  41 - 53 % Final  . Platelets 04/16/2015 297  150 - 399 K/L Final  . WBC 04/16/2015 4.5   Final  . Glucose 04/16/2015 94   Final  . BUN 04/16/2015 26* 4 - 21 mg/dL Final  . Creatinine 04/16/2015 1.1  0.6 - 1.3 mg/dL Final  . Potassium 04/16/2015 5.0  3.4 - 5.3 mmol/L Final  . Sodium 04/16/2015 139  137 - 147 mmol/L Final  . Hemoglobin A1C 04/16/2015 6.0   Final  . Microalb, Ur 03/19/2015 0.6   Final  Nursing Home on 03/25/2015  Component Date Value Ref Range Status  . Hemoglobin 01/16/2015 13.9  13.5 - 17.5 g/dL Final  . HCT 01/16/2015 43  41 - 53 % Final  . Platelets 01/16/2015 318  150 - 399 K/L Final  . WBC 01/16/2015 4.6  Final  . Glucose 01/16/2015 106   Final  . BUN 01/16/2015 19  4 - 21 mg/dL Final  . Creatinine 01/16/2015 1.1  0.6 - 1.3 mg/dL Final  . Potassium 01/16/2015 4.0  3.4 - 5.3 mmol/L Final  . Sodium 01/16/2015 141  137 - 147 mmol/L Final  . Triglycerides 01/16/2015 173* 40 - 160 mg/dL Final  . Cholesterol 01/16/2015 178  0 - 200 mg/dL Final  . HDL 01/16/2015 41  35 - 70 mg/dL Final  . LDL Cholesterol  01/16/2015 102   Final  . Alkaline Phosphatase 01/16/2015 72  25 - 125 U/L Final  . ALT 01/16/2015 35  10 - 40 U/L Final  . AST 01/16/2015 23  14 - 40 U/L Final  . Bilirubin, Total 01/16/2015 0.3   Final  . Hemoglobin A1C 01/16/2015 5.6   Final    No results found.   Assessment/Plan   ICD-9-CM ICD-10-CM   1. Essential hypertension 401.9 I10   2. Dementia without behavioral disturbance 294.20 F03.90   3. Aphasia as late effect of cerebrovascular accident 438.11 I69.920   4. Status post above knee amputation of left lower extremity ([place]) V49.76 Z89.612   5. 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 H/O: stroke with residual effects 438.9 I69.30   7. Controlled type 2 diabetes mellitus with complication, without long-term current use of insulin (HCC) 250.90 E11.8   8. Major depression, recurrent, chronic (HCC) 296.30 F33.9   9. Hyperlipidemia 272.4 E78.5   10. Enlarged prostate without urinary obstruction 600.90 N40.0      [facility] provider to follow  Pt is medically stable on current tx plan. Continue current medications as ordered. PT/OT/ST as indicated. Will follow  Jayveon Convey S. Perlie Gold  [facility] [address] [place], [place] [address] [phone] Cell (Monday-Friday 8 AM - 5 PM) [phone] After 5 PM and follow prompts

## 2015-06-14 ENCOUNTER — Emergency Department (HOSPITAL_COMMUNITY): Payer: Medicare Other

## 2015-06-14 ENCOUNTER — Emergency Department (HOSPITAL_COMMUNITY)
Admission: EM | Admit: 2015-06-14 | Discharge: 2015-06-14 | Disposition: A | Discharge status: Home / Self Care | Payer: Medicare Other | Attending: Emergency Medicine | Admitting: Emergency Medicine

## 2015-06-14 ENCOUNTER — Encounter (HOSPITAL_COMMUNITY): Payer: Self-pay | Admitting: Emergency Medicine

## 2015-06-14 DIAGNOSIS — Z79899 Other long term (current) drug therapy: Secondary | ICD-10-CM | POA: Insufficient documentation

## 2015-06-14 DIAGNOSIS — Y998 Other external cause status: Secondary | ICD-10-CM | POA: Insufficient documentation

## 2015-06-14 DIAGNOSIS — S40012A Contusion of left shoulder, initial encounter: Secondary | ICD-10-CM | POA: Diagnosis not present

## 2015-06-14 DIAGNOSIS — Y9389 Activity, other specified: Secondary | ICD-10-CM | POA: Diagnosis not present

## 2015-06-14 DIAGNOSIS — I10 Essential (primary) hypertension: Secondary | ICD-10-CM | POA: Diagnosis not present

## 2015-06-14 DIAGNOSIS — D649 Anemia, unspecified: Secondary | ICD-10-CM | POA: Diagnosis not present

## 2015-06-14 DIAGNOSIS — Z87891 Personal history of nicotine dependence: Secondary | ICD-10-CM | POA: Diagnosis not present

## 2015-06-14 DIAGNOSIS — I69328 Other speech and language deficits following cerebral infarction: Secondary | ICD-10-CM | POA: Insufficient documentation

## 2015-06-14 DIAGNOSIS — E785 Hyperlipidemia, unspecified: Secondary | ICD-10-CM | POA: Diagnosis not present

## 2015-06-14 DIAGNOSIS — Y9289 Other specified places as the place of occurrence of the external cause: Secondary | ICD-10-CM | POA: Insufficient documentation

## 2015-06-14 DIAGNOSIS — S4992XA Unspecified injury of left shoulder and upper arm, initial encounter: Secondary | ICD-10-CM | POA: Diagnosis present

## 2015-06-14 DIAGNOSIS — Z87448 Personal history of other diseases of urinary system: Secondary | ICD-10-CM | POA: Insufficient documentation

## 2015-06-14 DIAGNOSIS — W1839XA Other fall on same level, initial encounter: Secondary | ICD-10-CM | POA: Insufficient documentation

## 2015-06-14 DIAGNOSIS — Z7982 Long term (current) use of aspirin: Secondary | ICD-10-CM | POA: Insufficient documentation

## 2015-06-14 DIAGNOSIS — E119 Type 2 diabetes mellitus without complications: Secondary | ICD-10-CM | POA: Insufficient documentation

## 2015-06-14 DIAGNOSIS — I509 Heart failure, unspecified: Secondary | ICD-10-CM | POA: Insufficient documentation

## 2015-06-14 DIAGNOSIS — Z8701 Personal history of pneumonia (recurrent): Secondary | ICD-10-CM | POA: Insufficient documentation

## 2015-06-14 DIAGNOSIS — W19XXXA Unspecified fall, initial encounter: Secondary | ICD-10-CM

## 2015-06-14 MED ORDER — HYDROCODONE-ACETAMINOPHEN 5-325 MG PO TABS
1.0000 | ORAL_TABLET | Freq: Once | ORAL | Status: AC
  Administered 2015-06-14: 1 via ORAL
  Filled 2015-06-14: qty 1

## 2015-06-14 MED ORDER — HYDROCODONE-ACETAMINOPHEN 5-325 MG PO TABS: 1.0000 | ORAL_TABLET | Freq: Four times a day (QID) | ORAL | Status: DC | PRN

## 2015-06-14 NOTE — Discharge Instructions (Signed)
Hydrocodone as prescribed as needed for pain.  Follow-up with your primary Dr. if not improving in the next week.   Contusion A contusion is a deep bruise. Contusions are the result of a blunt injury to tissues and muscle fibers under the skin. The injury causes bleeding under the skin. The skin overlying the contusion may turn blue, purple, or yellow. Minor injuries will give you a painless contusion, but more severe contusions may stay painful and swollen for a few weeks.  CAUSES  This condition is usually caused by a blow, trauma, or direct force to an area of the body. SYMPTOMS  Symptoms of this condition include:  Swelling of the injured area.  Pain and tenderness in the injured area.  Discoloration. The area may have redness and then turn blue, purple, or yellow. DIAGNOSIS  This condition is diagnosed based on a physical exam and medical history. An X-ray, CT scan, or MRI may be needed to determine if there are any associated injuries, such as broken bones (fractures). TREATMENT  Specific treatment for this condition depends on what area of the body was injured. In general, the best treatment for a contusion is resting, icing, applying pressure to (compression), and elevating the injured area. This is often called the RICE strategy. Over-the-counter anti-inflammatory medicines may also be recommended for pain control.  HOME CARE INSTRUCTIONS   Rest the injured area.  If directed, apply ice to the injured area:  Put ice in a plastic bag.  Place a towel between your skin and the bag.  Leave the ice on for 20 minutes, 2-3 times per day.  If directed, apply light compression to the injured area using an elastic bandage. Make sure the bandage is not wrapped too tightly. Remove and reapply the bandage as directed by your health care provider.  If possible, raise (elevate) the injured area above the level of your heart while you are sitting or lying down.  Take over-the-counter  and prescription medicines only as told by your health care provider. SEEK MEDICAL CARE IF:  Your symptoms do not improve after several days of treatment.  Your symptoms get worse.  You have difficulty moving the injured area. SEEK IMMEDIATE MEDICAL CARE IF:   You have severe pain.  You have numbness in a hand or foot.  Your hand or foot turns pale or cold.   This information is not intended to replace advice given to you by your health care provider. Make sure you discuss any questions you have with your health care provider.   Document Released: 11/05/2004 Document Revised: 10/17/2014 Document Reviewed: 06/13/2014 Elsevier Interactive Patient Education [employer].

## 2015-06-14 NOTE — ED Notes (Signed)
[facility] stated it would be "awhile" before they could be here to get his to transport

## 2015-06-14 NOTE — ED Notes (Signed)
[facility] here to transport  

## 2015-06-14 NOTE — ED Notes (Signed)
Per [facility]  Pt coming from [facility]. Pt fell last night at approx 2045   Pt c/o L shoulder pain. Pt had x-rays at facility but they have not resulted. Pt has a hx of a stroke with speech impairment but per facility his speech sounds the same. Pt has left AKA and a R hand impairment. Pt sent to Er due to continued pain.    178/111 hx of HTN 65 HR 100% RA 16R   Pt A&O per his norm.

## 2015-06-14 NOTE — ED Notes (Signed)
CALLED [facility] [phone]--LESLIE

## 2015-06-14 NOTE — ED Provider Notes (Signed)
CSN: [id number]     Arrival date & time 06/14/15  0126 History   By signing my name below, I, Forrestine Him, attest that this documentation has been prepared under the direction and in the presence of Veryl Speak, MD.  Electronically Signed: Forrestine Him, ED Scribe. 06/14/2015. 2:23 AM.   Chief Complaint  Patient presents with  . Fall  . Shoulder Injury   The history is provided by the patient. No language interpreter was used.    LEVEL 5 CAVEAT DUE TO SPEECH IMPAIRMENT SECONDARY TO PRIOR STROKE   HPI Comments: Jeremy Howell brought in by EMS from [facility] is a 60 y.o. male with a PMHx of HTN, DM, CHF, stroke, and hyperlipidemia who presents to the Emergency Department here for a L shoulder injury this evening. Per triage note, pt sustained a fall last night at approximately 20:45. He now c/o constant, ongoing L shoulder pain. X-Rays performed at facility however, imaging has not yet resulted.  PCP: Gildardo Cranker, DO    Past Medical History  Diagnosis Date  . Cardiomyopathy     EF 20-25% by echo 01/2010 with severe LVH felt secondary to substance abuse (no ischemic workup)  . Diabetes mellitus   . HTN (hypertension)   . Polysubstance abuse     Reported hx of cocaine, THC, heroin, EtOH abuse  . Noncompliance   . Peripheral vascular disease ([place])   . CHF (congestive heart failure) ([place])   . Pneumonia   . Dependent for walking   . Wheelchair bound   . Acute renal failure ([place])   . Gangrene of foot ([place])   . Streptococcal pneumonia ([place])   . 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 Hyperlipidemia   . Anemia   . Urinary retention   . Hyperkalemia   . H/O: substance abuse     cocaine, heroin, ETOH, marijuana   Past Surgical History  Procedure Laterality Date  . Total hip arthroplasty Bilateral   . Amputation  08/15/2011    Procedure: AMPUTATION ABOVE KNEE;  Surgeon: Elam Dutch, MD;  Location: [place];  Service: Vascular;  Laterality: Left;  Amputation End:  [id number]  . Femoral-tibial bypass graft  12/23/2011    Procedure: BYPASS GRAFT FEMORAL-TIBIAL ARTERY;  Surgeon: Elam Dutch, MD;  Location: [facility] OR;  Service: Vascular;  Laterality: Right;  Right Popliteal - Tibial Artery Bypass using Reversed Saphenous vein  . Abdominal aortagram N/A 10/16/2011    Procedure: ABDOMINAL Maxcine Ham;  Surgeon: Elam Dutch, MD;  Location: [facility] CATH LAB;  Service: Cardiovascular;  Laterality: N/A;  . Lower extremity angiogram N/A 02/12/2012    Procedure: LOWER EXTREMITY ANGIOGRAM;  Surgeon: Elam Dutch, MD;  Location: [facility] CATH LAB;  Service: Cardiovascular;  Laterality: N/A;  . Abdominal angiogram  02/12/2012    Procedure: ABDOMINAL ANGIOGRAM;  Surgeon: Elam Dutch, MD;  Location: [facility] CATH LAB;  Service: Cardiovascular;;  . Percutaneous stent intervention  02/12/2012    Procedure: PERCUTANEOUS STENT INTERVENTION;  Surgeon: Elam Dutch, MD;  Location: [facility] CATH LAB;  Service: Cardiovascular;;  . Abdominal aortagram N/A 10/14/2012    Procedure: ABDOMINAL Maxcine Ham;  Surgeon: Elam Dutch, MD;  Location: [facility] CATH LAB;  Service: Cardiovascular;  Laterality: N/A;   Family History  Problem Relation Age of Onset  . Stroke Brother   . Emphysema Brother   . Stroke Mother   . Heart  disease Mother   . Diabetes Mother   . Hepatitis Mother   . Clotting disorder Mother   . Alzheimer's disease Mother   . Pneumonia Father    Social History  Substance Use Topics  . Smoking status: Former Smoker -- 15 years    Types: Cigarettes    Quit date: 08/10/2011  . Smokeless tobacco: Never Used     Comment: declined  . Alcohol Use: No    Review of Systems  Unable to perform ROS: Other      Allergies  Almond oil  Home Medications   Prior to Admission medications   Medication Sig Start Date End Date Taking? Authorizing Provider  acetaminophen (TYLENOL) 325 MG tablet Take 650 mg by mouth every 6 (six)  hours as needed.    Historical Provider, MD  Alum & Mag Hydroxide-Simeth (GERI-LANTA PO) Give 30 cc by mouth every 2 hours PRN for indigestion x 24 hours. Call MD if indigestion is not relieved within 24 hours.    Historical Provider, MD  aspirin 325 MG tablet Take 325 mg by mouth daily.    Historical Provider, MD  ATORVASTATIN CALCIUM PO Give 1 tablet by mouth every morning ( 40 mg )    Historical Provider, MD  bisacodyl (DULCOLAX) 10 MG suppository Place 10 mg rectally as needed for moderate constipation.    Historical Provider, MD  carvedilol (COREG) 25 MG tablet Take 25 mg by mouth daily.     Historical Provider, MD  citalopram (CELEXA) 10 MG tablet Take 10 mg by mouth daily.    Historical Provider, MD  docusate sodium (COLACE) 100 MG capsule Take 100 mg by mouth 2 (two) times daily.    Historical Provider, MD  ferrous sulfate 325 (65 FE) MG tablet Take 325 mg by mouth 3 (three) times daily with meals.     Historical Provider, MD  finasteride (PROSCAR) 5 MG tablet Take 5 mg by mouth daily.    Historical Provider, MD  furosemide (LASIX) 20 MG tablet Take 20 mg by mouth daily.    Historical Provider, MD  LamoTRIgine 50 MG TBDP 1 tablet by mouth once daily    Historical Provider, MD  magnesium hydroxide (MILK OF MAGNESIA) 400 MG/5ML suspension Take by mouth daily as needed for mild constipation.    Historical Provider, MD  Na Sulfate-K Sulfate-Mg Sulf SOLN Suprep-Use as directed 05/27/15   Jerene Bears, MD  omeprazole (PRILOSEC) 40 MG capsule Take 40 mg by mouth daily.    Historical Provider, MD  Polyethyl Glycol-Propyl Glycol (SYSTANE OP) Place 2 drops into both eyes daily as needed (No frequency limitations.).    Historical Provider, MD  polyethylene glycol (MIRALAX / GLYCOLAX) packet Take 17 g by mouth daily. 05/03/13   Jerene Bears, MD  saccharomyces boulardii (FLORASTOR) 250 MG capsule Take 250 mg by mouth 2 (two) times daily.    Historical Provider, MD  tamsulosin (FLOMAX) 0.4 MG CAPS capsule  Take 1 capsule (0.4 mg total) by mouth daily after breakfast. 03/02/13   Melton Alar, PA-C  vitamin C (ASCORBIC ACID) 500 MG tablet Take 500 mg by mouth daily. To be given with iron    Historical Provider, MD  Vitamin D, Ergocalciferol, (DRISDOL) 50000 UNITS CAPS capsule Take 50,000 Units by mouth every 30 (thirty) days. 14th of the month    Historical Provider, MD   Triage Vitals: SpO2 100%   Physical Exam  Constitutional: He is oriented to person, place, and time. He appears well-developed  and well-nourished.  HENT:  Head: Normocephalic and atraumatic.  Eyes: EOM are normal.  Neck: Normal range of motion.  Cardiovascular: Normal rate, regular rhythm, normal heart sounds and intact distal pulses.   Pulmonary/Chest: Effort normal and breath sounds normal. No respiratory distress.  Abdominal: Soft. He exhibits no distension. There is no tenderness.  Musculoskeletal: Normal range of motion.  Neurological: He is alert and oriented to person, place, and time.  Skin: Skin is warm and dry.  Psychiatric: He has a normal mood and affect. Judgment normal.  Nursing note and vitals reviewed.   ED Course  Procedures (including critical care time)  DIAGNOSTIC STUDIES: Oxygen Saturation is 100% on RA, Normal by my interpretation.    COORDINATION OF CARE: 2:18 AM- Will order imaging. Discussed treatment plan with pt at bedside and pt agreed to plan.     Labs Review Labs Reviewed - No data to display  Imaging Review No results found. I have personally reviewed and evaluated these images and lab results as part of my medical decision-making.    MDM   Final diagnoses:  None    X-rays are negative for fracture. I see no other sign of injury. He will be discharged with instructions to return as needed for any problems.  I personally performed the services described in this documentation, which was scribed in my presence. The recorded information has been reviewed and is accurate.        Veryl Speak, MD 06/14/15 [id number]

## 2015-06-14 NOTE — ED Notes (Signed)
Discharge instructions given to [facility] and report called to staff at [facility]

## 2015-06-25 ENCOUNTER — Encounter (HOSPITAL_COMMUNITY): Payer: Self-pay | Admitting: *Deleted

## 2015-06-25 ENCOUNTER — Encounter (HOSPITAL_COMMUNITY): Payer: Self-pay | Admitting: Anesthesiology

## 2015-06-25 NOTE — Progress Notes (Addendum)
06-25-15 1130 Dr. Lissa Hoard reviewed pt history per Epic notes, EKG 1'15, Echo results 1'13, LOV notes 06-13-15 with PCP- noted in Epic

## 2015-06-25 NOTE — Progress Notes (Signed)
Preop instructions for:   Jeremy Howell                      Date of Birth  Jul 28, 1955                          Date of Procedure:  06-27-2015     Doctor: Lenna Sciara. Pyrtle Time to arrive at [facility]: 0900 Am Report to: Admitting Procedure time: 1030-1130 AM     Procedure: Colonoscopy with Propofol Any procedure time changes, MD office will notify you!   Do not eat or drink past midnight the night before your procedure.(To include any tube feedings-must be discontinued) Reminder:Follow bowel prep instructions per MD office!   Take these morning medications only with sips of water.(or give through gastrostomy or feeding tube). Carvedilol- if applies. Citalopram. Finasteride. Lamictal. Omeprazole. Tamsulosin. Insulin(1/2 usual PM dose) night before- no Diabetic or Insulin AM of procedure.  Note: No Insulin or Diabetic meds should be given or taken the morning of the procedure!   Facility contact: "Danae Chen [facility]        Phone:  [phone] p/ [phone] f                Health Care POA: Eugen Rigler, brother [phone] cell(06-25-15 Brother confirmed he would be present).  Transportation contact phone#:[facility] [phone]  Please send day of procedure:current med list and meds last taken/with date and time- that day, confirm nothing by mouth status from what time. Patient Demographic info( to include DNR status, problem list, allergies)   RN contact name/phone#: "Danae Chen [phone]                            and Fax #: [phone]  Bring Insurance card and picture ID Leave all jewelry and other valuables at place where living( no metal or rings to be worn) No contact lens Women-no make-up, no lotions,perfumes,powders Men-no colognes,lotions  Any questions day of procedure,call Endoscopy [phone]!   Sent from :[facility] Presurgical Testing                   [place]                   Fax:[phone]  Sent by :RN: Demarques Pilz,RN-Presurgical Testing Dept- [facility]__ Please return a call to [phone] upon receipt of this information, Thank you.

## 2015-06-27 ENCOUNTER — Telehealth: Payer: Self-pay | Admitting: Internal Medicine

## 2015-06-27 ENCOUNTER — Ambulatory Visit (HOSPITAL_COMMUNITY): Admission: RE | Admit: 2015-06-27 | Payer: Medicare Other | Source: Ambulatory Visit | Admitting: Internal Medicine

## 2015-06-27 HISTORY — DX: Benign prostatic hyperplasia without lower urinary tract symptoms: N40.0

## 2015-06-27 HISTORY — DX: Acquired absence of unspecified leg above knee: Z89.619

## 2015-06-27 SURGERY — COLONOSCOPY WITH PROPOFOL
Anesthesia: Monitor Anesthesia Care

## 2015-06-27 NOTE — Telephone Encounter (Signed)
Spoke with Ria Comment at [place] and pts stools are still formed and brown. Procedure cancelled. Dr. Hilarie Fredrickson should pt be rescheduled to next available hospital slot? Please advise.

## 2015-06-27 NOTE — Telephone Encounter (Signed)
Yes, next outpatient day at [facility] with MAC (Tuesday AM or Friday AM in my scheduled slots.  Not hospital week please)

## 2015-07-11 ENCOUNTER — Encounter: Payer: Self-pay | Admitting: Internal Medicine

## 2015-07-11 ENCOUNTER — Non-Acute Institutional Stay (SKILLED_NURSING_FACILITY): Payer: Medicare Other | Admitting: Internal Medicine

## 2015-07-11 DIAGNOSIS — I5042 Chronic combined systolic (congestive) and diastolic (congestive) heart failure: Secondary | ICD-10-CM

## 2015-07-11 DIAGNOSIS — I6930 Unspecified sequelae of cerebral infarction: Secondary | ICD-10-CM

## 2015-07-11 DIAGNOSIS — E785 Hyperlipidemia, unspecified: Secondary | ICD-10-CM | POA: Diagnosis not present

## 2015-07-11 DIAGNOSIS — N183 Chronic kidney disease, stage 3 unspecified: Secondary | ICD-10-CM

## 2015-07-11 DIAGNOSIS — E1122 Type 2 diabetes mellitus with diabetic chronic kidney disease: Secondary | ICD-10-CM | POA: Diagnosis not present

## 2015-07-11 DIAGNOSIS — Z89612 Acquired absence of left leg above knee: Secondary | ICD-10-CM | POA: Diagnosis not present

## 2015-07-11 DIAGNOSIS — M25512 Pain in left shoulder: Secondary | ICD-10-CM

## 2015-07-11 DIAGNOSIS — F339 Major depressive disorder, recurrent, unspecified: Secondary | ICD-10-CM

## 2015-07-11 DIAGNOSIS — I10 Essential (primary) hypertension: Secondary | ICD-10-CM | POA: Diagnosis not present

## 2015-07-11 DIAGNOSIS — F0390 Unspecified dementia without behavioral disturbance: Secondary | ICD-10-CM | POA: Diagnosis not present

## 2015-07-11 NOTE — Progress Notes (Signed)
DATE: 07/11/15  Location:  [facility] [place] Room Number: 121 B Place of Service: SNF (31)   Extended Emergency Contact Information Primary Emergency Contact: [facility] Address: [address]          [place], [place] [address] Johnnette Litter of [place] Phone: [phone] Work Phone: [phone] Mobile Phone: [phone] Relation: Brother Secondary Emergency Contact: Lelon Huh Address: [id number]          [place], [place] [address] [place] of [place] Phone: [phone] Mobile Phone: [phone] Relation: Daughter  Advanced Directive information Does patient have an advance directive?: No, Would patient like information on creating an advanced directive?: No - patient declined information FULL CODE Chief Complaint  Patient presents with  . Medical Management of Chronic Issues    Routine Visit/ [employer]    HPI:  60 yo male long term resident seen today for f/u. He c/o left shoulder pain with external rotation. Admits to lifting a light bar bell to improve his strength. He has rec'd PT to shoulder also. He is a poor historian due to dementia. Hx obtained from chart. He is unsure if he has had an xray of left shoulder. No nursng issues. No falls  CHF/ CM - currently euvolemic on coreg and lasix  DM - CBGs not checked on regular basis. A1c 5.6%. Urine microalbumin  0.6  Elevated NH3 level/encephalopathy - stable. He no longer takes lactulose  PAD/hyperlipidemia/hx CVA - s/p left AKA. takes statin and ASA daily. He takes lamictal daily  HTN - BP stable on coreg  Depression/dementia - mood stable on celexa. He does not take any med for his memory  BPH - sx's stable on proscar and flomax  He takes vitamins and minerals daily  CKD - Cr 1.1  Past Medical History  Diagnosis Date  . Cardiomyopathy     EF 20-25% by echo 01/2010 with severe LVH felt secondary to substance abuse (no ischemic workup)  . Diabetes mellitus   .  HTN (hypertension)   . Polysubstance abuse     Reported hx of cocaine, THC, heroin, EtOH abuse  . Noncompliance   . Peripheral vascular disease ([place])   . CHF (congestive heart failure) ([place])   . Pneumonia   . Dependent for walking   . Wheelchair bound   . Acute renal failure ([place])   . Gangrene of foot ([place])   . Streptococcal pneumonia ([place])   . 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 Hyperlipidemia   . Anemia   . Urinary retention   . Hyperkalemia   . H/O: substance abuse     cocaine, heroin, ETOH, marijuana  . BPH (benign prostatic hyperplasia)   . Amputee, above knee ([place])     left Above Knee Amputee- wheelchair bound    Past Surgical History  Procedure Laterality Date  . Total hip arthroplasty Bilateral   . Amputation  08/15/2011    Procedure: AMPUTATION ABOVE KNEE;  Surgeon: Elam Dutch, MD;  Location: [place];  Service: Vascular;  Laterality: Left;  Amputation End:  [id number]  . Femoral-tibial bypass graft  12/23/2011    Procedure: BYPASS GRAFT FEMORAL-TIBIAL ARTERY;  Surgeon: Elam Dutch, MD;  Location: [facility] OR;  Service: Vascular;  Laterality: Right;  Right Popliteal - Tibial Artery Bypass using Reversed Saphenous vein  . Abdominal aortagram N/A 10/16/2011    Procedure:  ABDOMINAL AORTAGRAM;  Surgeon: Elam Dutch, MD;  Location: [facility] CATH LAB;  Service: Cardiovascular;  Laterality: N/A;  . Lower extremity angiogram N/A 02/12/2012    Procedure: LOWER EXTREMITY ANGIOGRAM;  Surgeon: Elam Dutch, MD;  Location: [facility] CATH LAB;  Service: Cardiovascular;  Laterality: N/A;  . Abdominal angiogram  02/12/2012    Procedure: ABDOMINAL ANGIOGRAM;  Surgeon: Elam Dutch, MD;  Location: [facility] CATH LAB;  Service: Cardiovascular;;  . Percutaneous stent intervention  02/12/2012    Procedure: PERCUTANEOUS STENT INTERVENTION;  Surgeon: Elam Dutch, MD;  Location: [facility] CATH LAB;  Service: Cardiovascular;;  . Abdominal  aortagram N/A 10/14/2012    Procedure: ABDOMINAL Maxcine Ham;  Surgeon: Elam Dutch, MD;  Location: [facility] CATH LAB;  Service: Cardiovascular;  Laterality: N/A;  . Joint replacement      BTHA    Patient Care Team: Gildardo Cranker, DO as PCP - General (Internal Medicine)  Social History   Social History  . Marital Status: Single    Spouse Name: N/A  . Number of Children: 4  . Years of Education: N/A   Occupational History  . disabled    Social History Main Topics  . Smoking status: Former Smoker -- 15 years    Types: Cigarettes    Quit date: 08/10/2011  . Smokeless tobacco: Never Used     Comment: declined  . Alcohol Use: No  . Drug Use: No     Comment: + previous history  . Sexual Activity: Not on file   Other Topics Concern  . Not on file   Social History Narrative   Lives with sister      reports that he quit smoking about 3 years ago. His smoking use included Cigarettes. He quit after 15 years of use. He has never used smokeless tobacco. He reports that he does not drink alcohol or use illicit drugs.  Family History  Problem Relation Age of Onset  . Stroke Brother   . Emphysema Brother   . Stroke Mother   . Heart disease Mother   . Diabetes Mother   . Hepatitis Mother   . Clotting disorder Mother   . Alzheimer's disease Mother   . Pneumonia Father    Family Status  Relation Status Death Age  . Brother Alive   . Mother Deceased 29  . Brother Alive   . Father Deceased 78    Pneumonia    Immunization History  Administered Date(s) Administered  . Influenza Split 10/24/2011  . Influenza-Unspecified 11/09/2012, 11/21/2014  . PPD Test 07/23/2011, 08/19/2011  . Pneumococcal Polysaccharide-23 10/24/2011  . Pneumococcal-Unspecified 08/24/2011, 09/12/2012    Allergies  Allergen Reactions  . Almond Oil Shortness Of Breath and Swelling    Medications: Patient's Medications  New Prescriptions   No medications on file  Previous Medications    ACETAMINOPHEN (TYLENOL) 325 MG TABLET    Take 650 mg by mouth every 6 (six) hours as needed for mild pain.    ALUM & MAG HYDROXIDE-SIMETH (GERI-LANTA PO)    Take 30 mLs by mouth every 2 (two) hours as needed. PRN for indigestion x 24 hours. Call MD if indigestion is not relieved within 24 hours.   ATORVASTATIN (LIPITOR) 40 MG TABLET    Take 40 mg by mouth daily.   BISACODYL (DULCOLAX) 10 MG SUPPOSITORY    Place 10 mg rectally daily as needed for moderate constipation (for constipation not relieved by milk of magnesia).    CARVEDILOL (COREG) 25 MG TABLET  Take 25 mg by mouth daily. Hold for HR<65   DOCUSATE SODIUM (COLACE) 100 MG CAPSULE    Take 100 mg by mouth 2 (two) times daily.   FERROUS SULFATE 325 (65 FE) MG TABLET    Take 325 mg by mouth daily with breakfast.    FINASTERIDE (PROSCAR) 5 MG TABLET    Take 5 mg by mouth daily.   FUROSEMIDE (LASIX) 20 MG TABLET    Take 20 mg by mouth daily.   LAMOTRIGINE (LAMICTAL) 100 MG TABLET    Take 100 mg by mouth daily.   MAGNESIUM HYDROXIDE (MILK OF MAGNESIA) 400 MG/5ML SUSPENSION    Take 30 mLs by mouth every 3 (three) days as needed for mild constipation.    NON FORMULARY    Med Pass give 130ml daily   OMEPRAZOLE (PRILOSEC) 40 MG CAPSULE    Take 40 mg by mouth daily.   POLYETHYLENE GLYCOL (MIRALAX / GLYCOLAX) PACKET    Take 17 g by mouth daily.   SACCHAROMYCES BOULARDII (FLORASTOR) 250 MG CAPSULE    Take 250 mg by mouth 2 (two) times daily.   TAMSULOSIN (FLOMAX) 0.4 MG CAPS CAPSULE    Take 1 capsule (0.4 mg total) by mouth daily after breakfast.   VITAMIN C (ASCORBIC ACID) 500 MG TABLET    Take 500 mg by mouth daily with breakfast. To be given with iron   VITAMIN D, ERGOCALCIFEROL, (DRISDOL) 50000 UNITS CAPS CAPSULE    Take 50,000 Units by mouth every 30 (thirty) days. 15th of the month  Modified Medications   No medications on file  Discontinued Medications   CITALOPRAM (CELEXA) 20 MG TABLET    Take 20 mg by mouth daily. Reported on 07/11/2015    POLYETHYL GLYCOL-PROPYL GLYCOL (SYSTANE OP)    Place 2 drops into both eyes as needed (For Dry Eyes; **No Frequency Limitations**). Reported on 07/11/2015    Review of Systems  Unable to perform ROS: Dementia    Filed Vitals:   07/11/15 1155  BP: 142/89  Pulse: 72  Temp: 98.2 F (36.8 C)  TempSrc: Oral  Resp: 18  Height: 5\' 7"  (1.702 m)  Weight: 147 lb 12.8 oz (67.042 kg)   Body mass index is 23.14 kg/(m^2).  Physical Exam  Constitutional: He appears well-developed.  Sitting in bed in NAD. Frail appearing  HENT:  Mouth/Throat: Oropharynx is clear and moist.  Eyes: Pupils are equal, round, and reactive to light. No scleral icterus.  Neck: Neck supple. Carotid bruit is not present.  Cardiovascular: Normal rate, regular rhythm and intact distal pulses.  Exam reveals no gallop and no friction rub.   Murmur (1/6 SEM) heard. Pulses:      Dorsalis pedis pulses are 1+ on the right side.       Posterior tibial pulses are 1+ on the right side.  +1 pitting RLE edema. No right calf TTP. Left AKA  Pulmonary/Chest: Effort normal and breath sounds normal. He has no wheezes. He has no rales. He exhibits no tenderness.  Abdominal: Soft. Bowel sounds are normal. He exhibits no distension, no abdominal bruit, no pulsatile midline mass and no mass. There is no tenderness. There is no rebound and no guarding.  Musculoskeletal: He exhibits edema and tenderness.  Left AKA; RUE flexion contracture. Left coracoid process TTP with swelling of anterior shoulder;(+) pain with external rotation and abduction. Strength reduced in LUE  Lymphadenopathy:    He has no cervical adenopathy.  Neurological: He is alert.  Expressive aphasia present  Skin: Skin is warm and dry. No rash noted.  Psychiatric: He has a normal mood and affect. His behavior is normal.     Labs reviewed: Nursing Home on 05/13/2015  Component Date Value Ref Range Status  . HM Diabetic Eye Exam 04/01/2015 No Retinopathy  No  Retinopathy Final  . Hemoglobin 04/16/2015 14.5  13.5 - 17.5 g/dL Final  . HCT 04/16/2015 44  41 - 53 % Final  . Platelets 04/16/2015 297  150 - 399 K/L Final  . WBC 04/16/2015 4.5   Final  . Glucose 04/16/2015 94   Final  . BUN 04/16/2015 26* 4 - 21 mg/dL Final  . Creatinine 04/16/2015 1.1  0.6 - 1.3 mg/dL Final  . Potassium 04/16/2015 5.0  3.4 - 5.3 mmol/L Final  . Sodium 04/16/2015 139  137 - 147 mmol/L Final  . Hemoglobin A1C 04/16/2015 6.0   Final  . Microalb, Ur 03/19/2015 0.6   Final    Dg Shoulder Left  06/14/2015  CLINICAL DATA:  [place] today. EXAM: LEFT SHOULDER - 2+ VIEW COMPARISON:  None. FINDINGS: Two views of the left shoulder are negative for fracture or dislocation. There is no bone lesion or bony destruction. No acute soft tissue abnormality is evident. IMPRESSION: Negative. Electronically Signed   By: Andreas Newport M.D.   On: 06/14/2015 03:10     Assessment/Plan   ICD-9-CM ICD-10-CM   1. Left shoulder pain 719.41 M25.512    due to OA; pain uncontrolled  2. H/O: stroke with residual effects 438.9 I69.30   3. Major depression, recurrent, chronic (HCC) - stable 296.30 F33.9   4. Dementia without behavioral disturbance 294.20 F03.90   5. Essential hypertension - stable 401.9 I10   6. Chronic combined mod- severe systolic (EF 99991111) and grade 1 diastolic CHF (congestive heart failure) 428.42 I50.42    428.0    7. Controlled type 2 diabetes mellitus with stage 3 chronic kidney disease, without long-term current use of insulin (HCC) 250.40 E11.22    585.3 N18.3   8. Hyperlipidemia 272.4 E78.5   9. Status post above knee amputation of left lower extremity ([place]) due to PAD V49.76 [id number]      Reviewed left shoulder xray report from 5/5th - showed mild OA but no fx or dislocation  Refer to Ortho for further mx. He has already completed PT  Cont current meds as ordered  Will need lipid panel, A1c, CMP next month  [employer] NP to follow  Will follow  Jlyn Bracamonte S.  Perlie Gold  [facility] and Adult Medicine [address] [place], [place] [address] [phone] Cell (Monday-Friday 8 AM - 5 PM) [phone] After 5 PM and follow prompts

## 2015-08-06 ENCOUNTER — Other Ambulatory Visit: Payer: Self-pay

## 2015-08-06 DIAGNOSIS — Z1211 Encounter for screening for malignant neoplasm of colon: Secondary | ICD-10-CM

## 2015-08-06 NOTE — Telephone Encounter (Signed)
Pts colon rescheduled at [facility] 08/20/15@10 :45am. Pt to arrive there at 9:15am. Left message for nurse at [facility] to call back.

## 2015-08-08 MED ORDER — NA SULFATE-K SULFATE-MG SULF 17.5-3.13-1.6 GM/177ML PO SOLN: ORAL | Status: DC

## 2015-08-08 NOTE — Telephone Encounter (Signed)
Spoke with nurse at [employer] [facility] [employer] and she is aware. Prep instructions faxed to [phone] along with prescription.

## 2015-08-15 NOTE — Progress Notes (Addendum)
Preop instructions for: Jeremy Howell                        Date of Birth   September 06, 1955                         Date of Procedure:Tuesday  08/20/15           Doctor:Dr. Hilarie Howell Time to arrive at [facility]:    0915 am Report to: Admitting Procedure time: Procedure:1045-1145 am Any procedure time changes, MD office will notify you!   Do not eat or drink past midnight the night before your procedure.(To include any tube feedings-must be discontinued) Reminder:Follow bowel prep instructions per MD office!   Take these morning medications only with sips of water.(or give through gastrostomy or feeding tube).Carvedilol- if applies, Lamictal, Omeprazole,Finesteride and Tamulosin  Note: No Insulin or Diabetic meds should be given or taken the morning of the procedure!   Facility contact: " [facility]               Phone: phone-[phone]/ fax-[phone]                 Health Care EQ:[id number] Jeremy Howell, brother [phone]  Transportation contact phone#:  Jeremy Howell  [phone]  Please send day of procedure:current med list and meds last taken that day, confirm nothing by mouth status from what time, Patient Demographic info( to include DNR status, problem list, allergies)   RN contact name/phone#: "Jeremy Howell [phone]                            and Fax [phone]  [employer] card and picture ID Leave all jewelry and other valuables at place where living( no metal or rings to be worn) No contact lens Women-no make-up, no lotions,perfumes,powders Men-no colognes,lotions  Any questions day of procedure,call Endoscopy unit-[phone]!   Sent from :[facility] Presurgical Testing                   [place]                   [phone]  Sent by :RN:  Jeremy Howell. Jeremy Grip, RN,BSN  Pre-Surgical Testing Dept.[facility]--Please return a call to [phone] upon receipt of this information. Thank you!

## 2015-08-16 ENCOUNTER — Encounter (HOSPITAL_COMMUNITY): Payer: Self-pay | Admitting: *Deleted

## 2015-08-19 NOTE — Progress Notes (Signed)
Spoke with lindsay at [facility] pre op instructions given and understood, per lindsay pt refusing to do bowel prep, lindsay given dr pyrtl'es number and mesaage left again for pt poa brother Nicole [facility] to call back and confirm he is coming to sign for pt.

## 2015-08-20 ENCOUNTER — Ambulatory Visit (HOSPITAL_COMMUNITY): Payer: Medicare Other | Admitting: Anesthesiology

## 2015-08-20 ENCOUNTER — Encounter (HOSPITAL_COMMUNITY)
Admission: RE | Disposition: A | Discharge status: Home / Self Care | Payer: Self-pay | Source: Ambulatory Visit | Attending: Internal Medicine

## 2015-08-20 ENCOUNTER — Ambulatory Visit (HOSPITAL_COMMUNITY)
Admission: RE | Admit: 2015-08-20 | Discharge: 2015-08-20 | Disposition: A | Discharge status: Home / Self Care | Payer: Medicare Other | Source: Ambulatory Visit | Attending: Internal Medicine | Admitting: Internal Medicine

## 2015-08-20 ENCOUNTER — Encounter (HOSPITAL_COMMUNITY): Payer: Self-pay

## 2015-08-20 DIAGNOSIS — K644 Residual hemorrhoidal skin tags: Secondary | ICD-10-CM | POA: Insufficient documentation

## 2015-08-20 DIAGNOSIS — I110 Hypertensive heart disease with heart failure: Secondary | ICD-10-CM | POA: Diagnosis not present

## 2015-08-20 DIAGNOSIS — Z87891 Personal history of nicotine dependence: Secondary | ICD-10-CM | POA: Insufficient documentation

## 2015-08-20 DIAGNOSIS — N400 Enlarged prostate without lower urinary tract symptoms: Secondary | ICD-10-CM | POA: Insufficient documentation

## 2015-08-20 DIAGNOSIS — I69351 Hemiplegia and hemiparesis following cerebral infarction affecting right dominant side: Secondary | ICD-10-CM | POA: Diagnosis not present

## 2015-08-20 DIAGNOSIS — Z1211 Encounter for screening for malignant neoplasm of colon: Secondary | ICD-10-CM

## 2015-08-20 DIAGNOSIS — K5909 Other constipation: Secondary | ICD-10-CM | POA: Diagnosis not present

## 2015-08-20 DIAGNOSIS — D125 Benign neoplasm of sigmoid colon: Secondary | ICD-10-CM

## 2015-08-20 DIAGNOSIS — Z96643 Presence of artificial hip joint, bilateral: Secondary | ICD-10-CM | POA: Insufficient documentation

## 2015-08-20 DIAGNOSIS — Z95828 Presence of other vascular implants and grafts: Secondary | ICD-10-CM | POA: Insufficient documentation

## 2015-08-20 DIAGNOSIS — I509 Heart failure, unspecified: Secondary | ICD-10-CM | POA: Insufficient documentation

## 2015-08-20 DIAGNOSIS — Z993 Dependence on wheelchair: Secondary | ICD-10-CM | POA: Diagnosis not present

## 2015-08-20 DIAGNOSIS — K5730 Diverticulosis of large intestine without perforation or abscess without bleeding: Secondary | ICD-10-CM | POA: Insufficient documentation

## 2015-08-20 DIAGNOSIS — Z79899 Other long term (current) drug therapy: Secondary | ICD-10-CM | POA: Insufficient documentation

## 2015-08-20 DIAGNOSIS — E785 Hyperlipidemia, unspecified: Secondary | ICD-10-CM | POA: Diagnosis not present

## 2015-08-20 DIAGNOSIS — Z89612 Acquired absence of left leg above knee: Secondary | ICD-10-CM | POA: Diagnosis not present

## 2015-08-20 DIAGNOSIS — K648 Other hemorrhoids: Secondary | ICD-10-CM | POA: Diagnosis not present

## 2015-08-20 DIAGNOSIS — E1165 Type 2 diabetes mellitus with hyperglycemia: Secondary | ICD-10-CM | POA: Insufficient documentation

## 2015-08-20 DIAGNOSIS — E1151 Type 2 diabetes mellitus with diabetic peripheral angiopathy without gangrene: Secondary | ICD-10-CM | POA: Insufficient documentation

## 2015-08-20 DIAGNOSIS — R195 Other fecal abnormalities: Secondary | ICD-10-CM

## 2015-08-20 HISTORY — PX: COLONOSCOPY: SHX5424

## 2015-08-20 LAB — GLUCOSE, CAPILLARY: GLUCOSE-CAPILLARY: 84 mg/dL (ref 65–99)

## 2015-08-20 SURGERY — COLONOSCOPY
Anesthesia: Monitor Anesthesia Care

## 2015-08-20 MED ORDER — PROPOFOL 10 MG/ML IV BOLUS
INTRAVENOUS | Status: DC | PRN
  Administered 2015-08-20 (×2): 50 mg via INTRAVENOUS

## 2015-08-20 MED ORDER — PROPOFOL 500 MG/50ML IV EMUL
INTRAVENOUS | Status: DC | PRN
  Administered 2015-08-20: 100 ug/kg/min via INTRAVENOUS

## 2015-08-20 MED ORDER — PROPOFOL 10 MG/ML IV BOLUS
INTRAVENOUS | Status: AC
  Filled 2015-08-20: qty 40

## 2015-08-20 MED ORDER — LIDOCAINE HCL (CARDIAC) 20 MG/ML IV SOLN
INTRAVENOUS | Status: AC
  Filled 2015-08-20: qty 5

## 2015-08-20 MED ORDER — SODIUM CHLORIDE 0.9 % IV SOLN
INTRAVENOUS | Status: DC
  Administered 2015-08-20: 10:00:00 via INTRAVENOUS

## 2015-08-20 MED ORDER — LIDOCAINE HCL (CARDIAC) 20 MG/ML IV SOLN
INTRAVENOUS | Status: DC | PRN
  Administered 2015-08-20: 50 mg via INTRAVENOUS

## 2015-08-20 MED ORDER — SODIUM CHLORIDE 0.9 % IV SOLN: INTRAVENOUS | Status: DC

## 2015-08-20 NOTE — Anesthesia Postprocedure Evaluation (Signed)
Anesthesia Post Note  Patient: Jeremy Howell  Procedure(s) Performed: Procedure(s) (LRB): COLONOSCOPY (N/A)  Patient location during evaluation: PACU Anesthesia Type: MAC Level of consciousness: awake and alert Pain management: pain level controlled Vital Signs Assessment: post-procedure vital signs reviewed and stable Respiratory status: spontaneous breathing, nonlabored ventilation and respiratory function stable Cardiovascular status: blood pressure returned to baseline and stable Postop Assessment: no signs of nausea or vomiting Anesthetic complications: no    Last Vitals:  Filed Vitals:   08/20/15 0948  BP: 151/78  Pulse: 69  Temp: 37.7 C  Resp: 15    Last Pain: There were no vitals filed for this visit.               Saige Canton A.

## 2015-08-20 NOTE — Anesthesia Preprocedure Evaluation (Addendum)
Anesthesia Evaluation  Patient identified by MRN, date of birth, ID band Patient awake    Reviewed: Allergy & Precautions, NPO status , Patient's Chart, lab work & pertinent test results, reviewed documented beta blocker date and time   Airway Mallampati: III  TM Distance: >3 FB Neck ROM: Full    Dental no notable dental hx. (+) Edentulous Upper, Edentulous Lower   Pulmonary pneumonia, resolved, former smoker,    Pulmonary exam normal breath sounds clear to auscultation       Cardiovascular hypertension, Pt. on medications and Pt. on home beta blockers + Peripheral Vascular Disease and +CHF  Normal cardiovascular exam Rhythm:Regular Rate:Normal  Cardiomyopathy LVEF 20-25% EKG- NSR w/ non specific IVCD   Neuro/Psych PSYCHIATRIC DISORDERS Depression  Neuromuscular disease CVA, Residual Symptoms    GI/Hepatic (+)     substance abuse  alcohol use, cocaine use, marijuana use and IV drug use,   Endo/Other  diabetes, Poorly Controlled, Type 2  Renal/GU Renal InsufficiencyRenal disease   Enlarged prostate    Musculoskeletal S/P left AKA   Abdominal   Peds  Hematology  (+) anemia ,   Anesthesia Other Findings   Reproductive/Obstetrics                          Anesthesia Physical Anesthesia Plan  ASA: III  Anesthesia Plan: MAC   Post-op Pain Management:    Induction: Intravenous  Airway Management Planned: Natural Airway and Simple Face Mask  Additional Equipment:   Intra-op Plan:   Post-operative Plan:   Informed Consent: I have reviewed the patients History and Physical, chart, labs and discussed the procedure including the risks, benefits and alternatives for the proposed anesthesia with the patient or authorized representative who has indicated his/her understanding and acceptance.     Plan Discussed with: CRNA, Anesthesiologist and Surgeon  Anesthesia Plan Comments:          Anesthesia Quick Evaluation

## 2015-08-20 NOTE — H&P (Signed)
HPI: Jeremy Howell is a 60 year old male with a complicated past medical history including but not limited to cardiomyopathy, diabetes, hypertension, peripheral vascular disease status post BKA, history of stroke with right hemiparesis who presents for outpatient colonoscopy to evaluate heme positive stool. He was seen in the office in April to discuss heme positive stool found at his living facility. He has a history of chronic constipation treated with MiraLAX and Colace. He denies seeing visible blood in his stool or melena. He denies abdominal pain.  Of note he did have a negative Cologuard in 2015.  Past Medical History  Diagnosis Date  . Cardiomyopathy     EF 20-25% by echo 01/2010 with severe LVH felt secondary to substance abuse (no ischemic workup)  . Diabetes mellitus   . HTN (hypertension)   . Polysubstance abuse     Reported hx of cocaine, THC, heroin, EtOH abuse  . Noncompliance   . Peripheral vascular disease ([place])   . CHF (congestive heart failure) ([place])   . Pneumonia   . Dependent for walking   . Wheelchair bound   . Acute renal failure ([place])   . Gangrene of foot ([place])   . Streptococcal pneumonia ([place])   . Stroke [facility])     Left MCA CVA 01/2010 not felt to be Coumadin candidate at that time because of noncompliance  . Hyperlipidemia   . Anemia   . Urinary retention   . Hyperkalemia   . H/O: substance abuse     cocaine, heroin, ETOH, marijuana  . BPH (benign prostatic hyperplasia)   . Amputee, above knee ([place])     left Above Knee Amputee- wheelchair bound    Past Surgical History  Procedure Laterality Date  . Total hip arthroplasty Bilateral   . Amputation  08/15/2011    Procedure: AMPUTATION ABOVE KNEE;  Surgeon: Elam Dutch, MD;  Location: [facility];  Service: Vascular;  Laterality: Left;  Amputation End:  [id number]  . Femoral-tibial bypass graft  12/23/2011    Procedure: BYPASS GRAFT FEMORAL-TIBIAL ARTERY;  Surgeon: Elam Dutch, MD;  Location: [facility] OR;  Service:  Vascular;  Laterality: Right;  Right Popliteal - Tibial Artery Bypass using Reversed Saphenous vein  . Abdominal aortagram N/A 10/16/2011    Procedure: ABDOMINAL Maxcine Ham;  Surgeon: Elam Dutch, MD;  Location: [facility] CATH LAB;  Service: Cardiovascular;  Laterality: N/A;  . Lower extremity angiogram N/A 02/12/2012    Procedure: LOWER EXTREMITY ANGIOGRAM;  Surgeon: Elam Dutch, MD;  Location: [facility] CATH LAB;  Service: Cardiovascular;  Laterality: N/A;  . Abdominal angiogram  02/12/2012    Procedure: ABDOMINAL ANGIOGRAM;  Surgeon: Elam Dutch, MD;  Location: [facility] CATH LAB;  Service: Cardiovascular;;  . Percutaneous stent intervention  02/12/2012    Procedure: PERCUTANEOUS STENT INTERVENTION;  Surgeon: Elam Dutch, MD;  Location: [facility] CATH LAB;  Service: Cardiovascular;;  . Abdominal aortagram N/A 10/14/2012    Procedure: ABDOMINAL Maxcine Ham;  Surgeon: Elam Dutch, MD;  Location: [facility] CATH LAB;  Service: Cardiovascular;  Laterality: N/A;  . Joint replacement      BTHA     (Not in an outpatient encounter)  Allergies  Allergen Reactions  . Almond Oil Shortness Of Breath and Swelling    Family History  Problem Relation Age of Onset  . Stroke Brother   . Emphysema Brother   . Stroke Mother   . Heart disease Mother   . Diabetes Mother   . Hepatitis Mother   . Clotting disorder  Mother   . Alzheimer's disease Mother   . Pneumonia Father     Social History  Substance Use Topics  . Smoking status: Former Smoker -- 15 years    Types: Cigarettes    Quit date: 08/10/2011  . Smokeless tobacco: Never Used     Comment: declined  . Alcohol Use: No    ROS: As per history of present illness, otherwise negative  BP 151/78 mmHg  Pulse 69  Temp(Src) 99.8 F (37.7 C) (Oral)  Resp 15  Ht 5\' 7"  (1.702 m)  Wt 147 lb (66.679 kg)  BMI 23.02 kg/m2  SpO2 98% Gen: awake, alert, NAD HEENT: anicteric, op clear CV: RRR, no mrg Pulm: CTA b/l Abd: soft, NT/ND, +BS throughout Ext: right  lower BKA Neuro: mild dysarthria, contracture of right upper extremity   RELEVANT LABS AND IMAGING: CBC    Component Value Date/Time   WBC 4.5 04/16/2015   WBC 4.6 03/02/2013 0344   RBC 4.28 03/02/2013 0344   HGB 14.5 04/16/2015   HCT 44 04/16/2015   PLT 297 04/16/2015   MCV 78.7 03/02/2013 0344   MCH 25.5* 03/02/2013 0344   MCHC 32.3 03/02/2013 0344   RDW 15.5 03/02/2013 0344   LYMPHSABS 1.5 02/27/2013 1919   MONOABS 0.7 02/27/2013 1919   EOSABS 0.1 02/27/2013 1919   BASOSABS 0.0 02/27/2013 1919    CMP     Component Value Date/Time   NA 139 04/16/2015   NA 136 05/23/2014 0909   K 5.0 04/16/2015   CL 102 05/23/2014 0909   CO2 28 03/02/2013 0344   GLUCOSE 86 05/23/2014 0909   BUN 26* 04/16/2015   BUN 57* 05/23/2014 0909   CREATININE 1.1 04/16/2015   CREATININE 3.10* 05/23/2014 0909   CALCIUM 8.3* 03/02/2013 0344   PROT 8.3 12/22/2011 1144   ALBUMIN 3.5 12/22/2011 1144   AST 23 01/16/2015   ALT 35 01/16/2015   ALKPHOS 72 01/16/2015   BILITOT 0.2* 12/22/2011 1144   GFRNONAA 76* 03/02/2013 0344   GFRAA 89* 03/02/2013 0344    ASSESSMENT/PLAN: 60 year old male with a complicated past medical history including but not limited to cardiomyopathy, diabetes, hypertension, peripheral vascular disease status post BKA, history of stroke with right hemiparesis who presents for outpatient colonoscopy to evaluate heme positive stool  1. Heme + stools -- After discussion of the risks, benefits and alternatives of both the patient and his family my recommendation was for colonoscopy to evaluate heme positive stool. Both the patient and family agree and wish to proceed. Positive Cologuard from 2 years ago is reassuring. MAC case.

## 2015-08-20 NOTE — Transfer of Care (Signed)
Immediate Anesthesia Transfer of Care Note  Patient: Jeremy Howell  Procedure(s) Performed: Procedure(s): COLONOSCOPY (N/A)  Patient Location: PACU  Anesthesia Type:MAC  Level of Consciousness: awake, alert  and oriented  Airway & Oxygen Therapy: Patient Spontanous Breathing and Patient connected to face mask oxygen  Post-op Assessment: Report given to RN and Post -op Vital signs reviewed and stable  Post vital signs: Reviewed and stable  Last Vitals:  Filed Vitals:   08/20/15 0948  BP: 151/78  Pulse: 69  Temp: 37.7 C  Resp: 15    Last Pain: There were no vitals filed for this visit.       Complications: No apparent anesthesia complications

## 2015-08-20 NOTE — Discharge Instructions (Signed)

## 2015-08-20 NOTE — Op Note (Signed)
[facility] Patient Name: Jeremy Howell Procedure Date: 08/20/2015 MRN: [id number] Attending MD: Jerene Bears , MD Date of Birth: 1955/10/05 CSN: [id number] Age: 60 Admit Type: Outpatient Procedure:                Colonoscopy Indications:              Heme positive stool, negative Cologuard 2015 Providers:                Lajuan Lines. Hilarie Fredrickson, MD, Cleda Daub, RN, William Dalton, Technician Referring MD:             Gildardo Cranker Medicines:                Monitored Anesthesia Care Complications:            No immediate complications. Estimated Blood Loss:     Estimated blood loss: none. Procedure:                Pre-Anesthesia Assessment:                           - Prior to the procedure, a History and Physical                            was performed, and patient medications and                            allergies were reviewed. The patient's tolerance of                            previous anesthesia was also reviewed. The risks                            and benefits of the procedure and the sedation                            options and risks were discussed with the patient.                            All questions were answered, and informed consent                            was obtained. Prior Anticoagulants: The patient has                            taken no previous anticoagulant or antiplatelet                            agents. ASA Grade Assessment: III - A patient with                            severe systemic disease. After reviewing the risks  and benefits, the patient was deemed in                            satisfactory condition to undergo the procedure.                           After obtaining informed consent, the colonoscope                            was passed under direct vision. Throughout the                            procedure, the patient's blood pressure, pulse, and           oxygen saturations were monitored continuously. The                            [id number]) scope was introduced through                            the anus and advanced to the the cecum, identified                            by appendiceal orifice and ileocecal valve. The                            colonoscopy was performed without difficulty. The                            patient tolerated the procedure well. The quality                            of the bowel preparation was fair in the right                            colon clearing to adequate with copious irrigation                            and lavage. The ileocecal valve, appendiceal                            orifice, and rectum were photographed. Scope In: 11:06:35 AM Scope Out: 11:24:14 AM Scope Withdrawal Time: 0 hours 15 minutes 18 seconds  Total Procedure Duration: 0 hours 17 minutes 39 seconds  Findings:      The perianal exam findings include a skin tag.      A 8 mm polyp was found in the sigmoid colon. The polyp was pedunculated.       The polyp was removed with a hot snare. Resection and retrieval were       complete.      Scattered small-mouthed diverticula were found in the sigmoid colon and       hepatic flexure.      Internal hemorrhoids were found during retroflexion. The hemorrhoids       were small. Impression:               -  Perianal skin tag found on perianal exam.                           - One 8 mm polyp in the sigmoid colon, removed with                            a hot snare. Resected and retrieved.                           - Diverticulosis in the sigmoid colon and at the                            hepatic flexure.                           - Internal hemorrhoids. Moderate Sedation:      N/A Recommendation:           - Patient has a contact number available for                            emergencies. The signs and symptoms of potential                            delayed complications  were discussed with the                            patient. Return to normal activities tomorrow.                            Written discharge instructions were provided to the                            patient.                           - Resume previous diet.                           - Continue present medications.                           - No aspirin, ibuprofen, naproxen, or other                            non-steroidal anti-inflammatory drugs for 2 weeks                            after polyp removal.                           - Await pathology results.                           - Repeat colonoscopy is recommended for  surveillance (2 day prep at next examination). The                            colonoscopy date will be determined after pathology                            results from today's exam become available for                            review. Procedure Code(s):        --- Professional ---                           [phone], Colonoscopy, flexible; with removal of                            tumor(s), polyp(s), or other lesion(s) by snare                            technique Diagnosis Code(s):        --- Professional ---                           K64.8, Other hemorrhoids                           D12.5, Benign neoplasm of sigmoid colon                           K64.4, Residual hemorrhoidal skin tags                           R19.5, Other fecal abnormalities                           K57.30, Diverticulosis of large intestine without                            perforation or abscess without bleeding CPT copyright 2016 American Medical Association. All rights reserved. The codes documented in this report are preliminary and upon coder review may  be revised to meet current compliance requirements. Jerene Bears, MD 08/20/2015 11:33:00 AM This report has been signed electronically. Number of Addenda: 0

## 2015-08-21 ENCOUNTER — Encounter (HOSPITAL_COMMUNITY): Payer: Self-pay | Admitting: Internal Medicine

## 2015-08-22 ENCOUNTER — Encounter: Payer: Self-pay | Admitting: Internal Medicine

## 2015-08-22 ENCOUNTER — Non-Acute Institutional Stay (SKILLED_NURSING_FACILITY): Payer: Medicare Other | Admitting: Internal Medicine

## 2015-08-22 DIAGNOSIS — I5042 Chronic combined systolic (congestive) and diastolic (congestive) heart failure: Secondary | ICD-10-CM | POA: Diagnosis not present

## 2015-08-22 DIAGNOSIS — D126 Benign neoplasm of colon, unspecified: Secondary | ICD-10-CM

## 2015-08-22 DIAGNOSIS — N183 Chronic kidney disease, stage 3 unspecified: Secondary | ICD-10-CM

## 2015-08-22 DIAGNOSIS — Z89612 Acquired absence of left leg above knee: Secondary | ICD-10-CM

## 2015-08-22 DIAGNOSIS — F339 Major depressive disorder, recurrent, unspecified: Secondary | ICD-10-CM

## 2015-08-22 DIAGNOSIS — I10 Essential (primary) hypertension: Secondary | ICD-10-CM | POA: Diagnosis not present

## 2015-08-22 DIAGNOSIS — I6930 Unspecified sequelae of cerebral infarction: Secondary | ICD-10-CM | POA: Diagnosis not present

## 2015-08-22 DIAGNOSIS — E1122 Type 2 diabetes mellitus with diabetic chronic kidney disease: Secondary | ICD-10-CM | POA: Diagnosis not present

## 2015-08-22 DIAGNOSIS — E785 Hyperlipidemia, unspecified: Secondary | ICD-10-CM

## 2015-08-22 DIAGNOSIS — F0390 Unspecified dementia without behavioral disturbance: Secondary | ICD-10-CM

## 2015-08-22 NOTE — Progress Notes (Signed)
Patient ID: Jeremy Howell, male   DOB: [birth year], 60 y.o.   MRN: [id number]    DATE: 08/22/15  Location:  Nursing Home Location: [facility]  Nursing Home Room Number: [place] of Service: SNF (31)   Extended Emergency Contact Information Primary Emergency Contact: [facility] Address: [address] [place], [place] [address] Johnnette Litter of [place] Phone: [phone] Work Phone: [phone] Mobile Phone: [phone] Relation: Brother Secondary Emergency Contact: Anitra Lauth, [place] [address] [place] of [place] Phone: [phone] Relation: Daughter  Advanced Directive information Does patient have an advance directive?: No, Would patient like information on creating an advanced directive?: No - patient declined information FULL CODE Chief Complaint  Patient presents with  . Medical Management of Chronic Issues    Routine Visit/ [facility]    HPI:   60 yo male long term resident seen today for f/u. He is a poor historian due to dementia. Hx obtained from chart. He underwent o/p colonoscopy by GI Dr Hilarie Fredrickson on 08/20/15. Polyp removed from sigmoid colon  that revealed pathology of tubular adenoma. Also seen was diverticular changes and rectal skin tag. He has no concerns today. No falls. No nursing issues  Left shoulder pain - unchanged. has rec'd PT  CHF/ CM - currently euvolemic on coreg and lasix  DM - CBGs not checked on regular basis. A1c 5.6%. Urine microalbumin  0.6  Elevated NH3 level/encephalopathy - stable. He no longer takes lactulose  PAD/hyperlipidemia/hx CVA - s/p left AKA. takes statin and ASA daily. He takes lamictal daily  HTN - BP stable on coreg  Depression/dementia - mood stable on celexa and lamictal. He does not take any med for his memory  BPH - sx's stable on proscar and flomax  He takes vitamins and minerals daily  CKD - Cr 1.1  Past Medical History  Diagnosis Date  . Cardiomyopathy     EF 20-25% by echo 01/2010 with severe LVH felt secondary to substance abuse (no ischemic workup)  . Diabetes mellitus   . HTN (hypertension)   . Polysubstance abuse     Reported hx of cocaine, THC, heroin, EtOH abuse  . Noncompliance   . Peripheral vascular disease ([place])   . CHF (congestive heart failure) ([place])   . Pneumonia   . Dependent for walking   . Wheelchair bound   . Acute renal failure ([place])   . Gangrene of foot ([place])   . Streptococcal pneumonia ([place])   . 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 Hyperlipidemia   . Anemia   . Urinary retention   . Hyperkalemia   . H/O: substance abuse     cocaine, heroin, ETOH, marijuana  . BPH (benign prostatic hyperplasia)   . Amputee, above knee ([place])     left Above Knee Amputee- wheelchair bound    Past Surgical History  Procedure Laterality Date  . Total hip arthroplasty Bilateral   . Amputation  08/15/2011    Procedure: AMPUTATION ABOVE KNEE;  Surgeon: Elam Dutch, MD;  Location: [place];  Service: Vascular;  Laterality: Left;  Amputation End:  [id number]  . Femoral-tibial bypass graft  12/23/2011    Procedure: BYPASS GRAFT FEMORAL-TIBIAL ARTERY;  Surgeon: Elam Dutch, MD;  Location: [facility];  Service: Vascular;  Laterality: Right;  Right Popliteal - Tibial Artery Bypass using Reversed Saphenous vein  . Abdominal aortagram N/A 10/16/2011    Procedure: ABDOMINAL Maxcine Ham;  Surgeon: Elam Dutch, MD;  Location: [facility] CATH LAB;  Service: Cardiovascular;  Laterality: N/A;  . Lower extremity angiogram N/A 02/12/2012    Procedure: LOWER EXTREMITY ANGIOGRAM;  Surgeon: Elam Dutch, MD;  Location: [facility] CATH LAB;  Service: Cardiovascular;  Laterality: N/A;  . Abdominal angiogram  02/12/2012    Procedure: ABDOMINAL ANGIOGRAM;  Surgeon: Elam Dutch, MD;  Location: [facility] CATH LAB;  Service: Cardiovascular;;  . Percutaneous stent intervention  02/12/2012    Procedure:  PERCUTANEOUS STENT INTERVENTION;  Surgeon: Elam Dutch, MD;  Location: [facility] CATH LAB;  Service: Cardiovascular;;  . Abdominal aortagram N/A 10/14/2012    Procedure: ABDOMINAL Maxcine Ham;  Surgeon: Elam Dutch, MD;  Location: [facility] CATH LAB;  Service: Cardiovascular;  Laterality: N/A;  . Joint replacement      BTHA  . Colonoscopy N/A 08/20/2015    Procedure: COLONOSCOPY;  Surgeon: Jerene Bears, MD;  Location: [facility] ENDOSCOPY;  Service: Gastroenterology;  Laterality: N/A;    Patient Care Team: Gildardo Cranker, DO as PCP - General (Internal Medicine)  Social History   Social History  . Marital Status: Single    Spouse Name: N/A  . Number of Children: 4  . Years of Education: N/A   Occupational History  . disabled    Social History Main Topics  . Smoking status: Former Smoker -- 15 years    Types: Cigarettes    Quit date: 08/10/2011  . Smokeless tobacco: Never Used     Comment: declined  . Alcohol Use: No  . Drug Use: No     Comment: + previous history  . Sexual Activity: Not on file   Other Topics Concern  . Not on file   Social History Narrative   Lives with sister      reports that he quit smoking about 4 years ago. His smoking use included Cigarettes. He quit after 15 years of use. He has never used smokeless tobacco. He reports that he does not drink alcohol or use illicit drugs.  Family History  Problem Relation Age of Onset  . Stroke Brother   . Emphysema Brother   . Stroke Mother   . Heart disease Mother   . Diabetes Mother   . Hepatitis Mother   . Clotting disorder Mother   . Alzheimer's disease Mother   . Pneumonia Father    Family Status  Relation Status Death Age  . Brother Alive   . Mother Deceased 47  . Brother Alive   . Father Deceased 95    Pneumonia    Immunization History  Administered Date(s) Administered  . Influenza Split 10/24/2011  . Influenza-Unspecified 11/09/2012, 11/21/2014  . PPD Test 07/23/2011, 08/19/2011  . Pneumococcal  Polysaccharide-23 10/24/2011  . Pneumococcal-Unspecified 08/24/2011, 09/12/2012    Allergies  Allergen Reactions  . Almond Oil Shortness Of Breath and Swelling    Medications: Patient's Medications  New Prescriptions   No medications on file  Previous Medications   ACETAMINOPHEN (TYLENOL) 325 MG TABLET    Take 650 mg by mouth every 6 (six) hours as needed for mild pain.    ALUM & MAG HYDROXIDE-SIMETH (GERI-LANTA PO)    Take 30 mLs by mouth every 2 (two) hours as needed. PRN for indigestion x 24 hours. Call MD if indigestion is not relieved within 24 hours.   ATORVASTATIN (LIPITOR) 40 MG TABLET  Take 40 mg by mouth daily.   BISACODYL (DULCOLAX) 10 MG SUPPOSITORY    Place 10 mg rectally daily as needed for moderate constipation (for constipation not relieved by milk of magnesia).    CARVEDILOL (COREG) 25 MG TABLET    Take 25 mg by mouth daily. Hold for HR<65   CHOLECALCIFEROL (VITAMIN D3) 50000 UNITS TABS    Take by mouth. Take 1 tablet by mouth once monthly   CITALOPRAM (CELEXA) 20 MG TABLET    Take 20 mg by mouth every morning.   DOCUSATE SODIUM (COLACE) 100 MG CAPSULE    Take 100 mg by mouth 2 (two) times daily.   FERROUS SULFATE 325 (65 FE) MG TABLET    Take 325 mg by mouth daily with breakfast.    FINASTERIDE (PROSCAR) 5 MG TABLET    Take 5 mg by mouth daily.   FUROSEMIDE (LASIX) 20 MG TABLET    Take 20 mg by mouth daily.   LAMOTRIGINE (LAMICTAL) 100 MG TABLET    Take 100 mg by mouth daily.   MAGNESIUM HYDROXIDE (MILK OF MAGNESIA) 400 MG/5ML SUSPENSION    Take 30 mLs by mouth every 3 (three) days as needed for mild constipation.    NON FORMULARY    Med Pass give 146ml daily   OMEPRAZOLE (PRILOSEC) 40 MG CAPSULE    Take 40 mg by mouth daily.   POLYETHYLENE GLYCOL (MIRALAX / GLYCOLAX) PACKET    Take 17 g by mouth daily.   PROPYLENE GLYCOL (SYSTANE BALANCE) 0.6 % SOLN    Place 1 drop into both eyes 2 (two) times daily as needed (dry eyes).   RISPERIDONE (RISPERDAL) 0.5 MG TABLET     Take 0.5 mg by mouth at bedtime.   SACCHAROMYCES BOULARDII (FLORASTOR) 250 MG CAPSULE    Take 250 mg by mouth 2 (two) times daily.   TAMSULOSIN (FLOMAX) 0.4 MG CAPS CAPSULE    Take 1 capsule (0.4 mg total) by mouth daily after breakfast.   VITAMIN C (ASCORBIC ACID) 500 MG TABLET    Take 500 mg by mouth daily with breakfast. To be given with iron  Modified Medications   No medications on file  Discontinued Medications   No medications on file    Review of Systems  Unable to perform ROS: Dementia    Filed Vitals:   08/22/15 1228  BP: 112/70  Pulse: 72  Temp: 97.2 F (36.2 C)  TempSrc: Oral  Resp: 18  Height: 5\' 7"  (1.702 m)  Weight: 152 lb (68.947 kg)   Body mass index is 23.8 kg/(m^2).  Physical Exam  Constitutional: He appears well-developed.  Sitting in w/c in NAD. Frail appearing  HENT:  Mouth/Throat: Oropharynx is clear and moist.  Eyes: Pupils are equal, round, and reactive to light. No scleral icterus.  Neck: Neck supple. Carotid bruit is not present.  Cardiovascular: Normal rate, regular rhythm and intact distal pulses.  Exam reveals no gallop and no friction rub.   Murmur (1/6 SEM) heard. Pulses:      Dorsalis pedis pulses are 1+ on the right side.       Posterior tibial pulses are 1+ on the right side.  +1 pitting RLE edema. No right calf TTP. Left AKA  Pulmonary/Chest: Effort normal and breath sounds normal. He has no wheezes. He has no rales. He exhibits no tenderness.  Abdominal: Soft. Bowel sounds are normal. He exhibits no distension, no abdominal bruit, no pulsatile midline mass and no mass. There is no tenderness.  There is no rebound and no guarding.  Musculoskeletal: He exhibits edema and tenderness.  Left AKA; RUE flexion contracture. Left coracoid process TTP with swelling of anterior shoulder;(+) pain with external rotation and abduction. Strength reduced in LUE  Lymphadenopathy:    He has no cervical adenopathy.  Neurological: He is alert.    Expressive aphasia present  Skin: Skin is warm and dry. No rash noted.  Psychiatric: He has a normal mood and affect. His behavior is normal.     Labs reviewed: Admission on 08/20/2015, Discharged on 08/20/2015  Component Date Value Ref Range Status  . Glucose-Capillary 08/20/2015 84  65 - 99 mg/dL Final    No results found.   Assessment/Plan   ICD-9-CM ICD-10-CM   1. Adenomatous colon polyp 211.3 D12.6    tubular adenomatous polyp of sigmoid colon dx in 08/2015  2. H/O: stroke with residual effects 438.9 I69.30   3. Major depression, recurrent, chronic (HCC) 296.30 F33.9   4. Dementia without behavioral disturbance 294.20 F03.90   5. Controlled type 2 diabetes mellitus with stage 3 chronic kidney disease, without long-term current use of insulin (HCC) 250.40 E11.22    585.3 N18.3   6. Chronic combined mod- severe systolic (EF 99991111) and grade 1 diastolic CHF (congestive heart failure) 428.42 I50.42    428.0    7. Essential hypertension 401.9 I10   8. Hyperlipidemia 272.4 E78.5   9. Status post above knee amputation of left lower extremity (HCC) V49.76 Z89.612     Check CMP, lipid panel and A1c  Cont current meds as ordered  PT/OT/ST as indicated  F/u with GI as scheduled for path results  [facility] NP to follow  Will follow   Jianni Batten S. Perlie Gold  [facility] [address] [place], [place] [address] [phone] Cell (Monday-Friday 8 AM - 5 PM) [phone] After 5 PM and follow prompts

## 2015-08-23 LAB — CBC AND DIFFERENTIAL
HCT: 43 % (ref 41–53)
HEMOGLOBIN: 14.6 g/dL (ref 13.5–17.5)
Platelets: 303 10*3/uL (ref 150–399)
WBC: 4.2 10*3/mL

## 2015-08-23 LAB — LIPID PANEL
CHOLESTEROL: 202 mg/dL — AB (ref 0–200)
HDL: 44 mg/dL (ref 35–70)
LDL CALC: 128 mg/dL
TRIGLYCERIDES: 151 mg/dL (ref 40–160)

## 2015-08-23 LAB — HEMOGLOBIN A1C: HEMOGLOBIN A1C: 5.8

## 2015-08-26 ENCOUNTER — Encounter: Payer: Self-pay | Admitting: Internal Medicine

## 2015-10-15 ENCOUNTER — Encounter: Payer: Self-pay | Admitting: Internal Medicine

## 2015-10-15 ENCOUNTER — Non-Acute Institutional Stay (SKILLED_NURSING_FACILITY): Payer: Medicare Other | Admitting: Internal Medicine

## 2015-10-15 DIAGNOSIS — I6930 Unspecified sequelae of cerebral infarction: Secondary | ICD-10-CM | POA: Diagnosis not present

## 2015-10-15 DIAGNOSIS — M6282 Rhabdomyolysis: Secondary | ICD-10-CM

## 2015-10-15 DIAGNOSIS — D509 Iron deficiency anemia, unspecified: Secondary | ICD-10-CM | POA: Diagnosis not present

## 2015-10-15 DIAGNOSIS — R7303 Prediabetes: Secondary | ICD-10-CM | POA: Diagnosis not present

## 2015-10-15 NOTE — Assessment & Plan Note (Signed)
Check CK as he is on atorvastatin 40 mg daily

## 2015-10-15 NOTE — Assessment & Plan Note (Addendum)
Expressive aphasia persists Flexion contractures of right upper extremity Blood pressure control adequate but carvedilol will be changed to 12.5 mg twice a day as its half-life is not 24 hours

## 2015-10-15 NOTE — Patient Instructions (Addendum)
New orders : Ferritin Vitamin D 25 OH level as he is on vitamin D3 50,000 units monthly CK because of PMH of rhabdomyolysis and present statin therapy Carvedilol 12.5 mg bid as it is bid drug based on its half life

## 2015-10-15 NOTE — Assessment & Plan Note (Signed)
A1c is 5.8% without medication. No intervention indicated

## 2015-10-15 NOTE — Assessment & Plan Note (Signed)
Ferritin level is indicated as hemoglobin was 14.6 on 08/22/76 and supplementation may not be necessary.

## 2015-10-15 NOTE — Progress Notes (Signed)
   Facility Location: [facility]  Room Number: 121-B  Code Status: Full Code   This is a nursing facility follow up of chronic medical diagnoses  Interim medical record and care since last [place] visit was updated with review of diagnostic studies and change in clinical status since last visit were documented.  HPI: This pleasant gentleman states he has no active symptoms except for intermittent itching of the right eye and intermittent pain in the left shoulder. History was difficult due to expressive aphasia; most answers were mumbled or monosyllabic. He was unable to give me the month or year or name of the president. Most recent labs from 08/23/15: LDL was 128 despite Lipitor dose of 40 mg. In 2013 he had a CK over 13,000 and was diagnosed with rhabdomyolysis. No current CK on record. He has a diagnosis of diabetes but his A1c is 5.8% ,non diabetic. He is on iron but his hemoglobin is 14.6.   Physical exam:  Pertinent or positive findings: His hair is cut very closely. He is Geneticist, molecular. He has dense arcus senilis. He has only two lower mandibular teeth but is otherwise edentulous.  Breath sounds are decreased. Heart sounds are heard best in the epigastrium. The right upper extremity is in a splint and he has a flexion contracture of the hand. He has an AKA of LLE. Pedal pulses in the right foot are decreased.  General appearance:Adequately nourished; no acute distress , increased work of breathing is present.   Lymphatic: No lymphadenopathy about the head, neck, axilla . Eyes: No conjunctival inflammation or lid edema is present. There is no scleral icterus. Ears:  External ear exam shows no significant lesions or deformities.   Nose:  External nasal examination shows no deformity or inflammation. Nasal mucosa are pink and moist without lesions ,exudates Oral exam: lips and gums are healthy appearing.There is no oropharyngeal erythema or  exudate . Neck:  No thyromegaly, masses, tenderness noted.    Heart:  Normal rate and regular rhythm. S1 and S2 normal without gallop, murmur, click, rub .  Lungs:Chest clear to auscultation without wheezes, rhonchi,rales , rubs. Abdomen:Bowel sounds are normal. Abdomen is soft and nontender with no organomegaly, hernias,masses. GU: deferred  Extremities:  No cyanosis, clubbing,edema  Neurologic exam : Balance,Rhomberg,finger to nose testing could not be completed due to clinical state Skin: Warm & dry w/o tenting. No significant lesions or rash.    See summary under each active problem in the Problem List with associated updated therapeutic plan

## 2015-12-31 ENCOUNTER — Encounter: Payer: Self-pay | Admitting: Internal Medicine

## 2015-12-31 ENCOUNTER — Non-Acute Institutional Stay (SKILLED_NURSING_FACILITY): Payer: Medicare Other | Admitting: Internal Medicine

## 2015-12-31 DIAGNOSIS — I6930 Unspecified sequelae of cerebral infarction: Secondary | ICD-10-CM

## 2015-12-31 DIAGNOSIS — F0280 Dementia in other diseases classified elsewhere without behavioral disturbance: Secondary | ICD-10-CM | POA: Diagnosis not present

## 2015-12-31 DIAGNOSIS — R7303 Prediabetes: Secondary | ICD-10-CM | POA: Diagnosis not present

## 2015-12-31 DIAGNOSIS — E782 Mixed hyperlipidemia: Secondary | ICD-10-CM | POA: Diagnosis not present

## 2015-12-31 NOTE — Assessment & Plan Note (Addendum)
Pharmacy was consulted to determine the difference in cost between Crestor 40 and atorvastatin 80 as LDL is not at goal of less than 70. Generic Crestor should be less likely to cause rhabdo and the atorvastatin as it is hydrophilic rather than lipophilic. After the change in medication  , follow-up lipids will be recommended with CK and liver function tests after 8-10 weeks.

## 2015-12-31 NOTE — Assessment & Plan Note (Signed)
Past history of rhabdo, LDL not at goal of less than 70. The atorvastatin 40 will be changed to generic Crestor 40 with follow-up labs after 8-10 weeks.

## 2015-12-31 NOTE — Patient Instructions (Signed)
See Current Assessment & Plan in Problem List under specific Diagnosis 

## 2015-12-31 NOTE — Assessment & Plan Note (Signed)
No meaningful history could be obtained; no behavioral abnormalities noted

## 2015-12-31 NOTE — Assessment & Plan Note (Signed)
08/23/15 A1c was nondiabetic at 5.8%

## 2015-12-31 NOTE — Progress Notes (Signed)
  This is a nursing facility follow up of chronic medical diagnoses  Interim medical record and care since last [place] visit was updated with review of diagnostic studies and change in clinical status since last visit were documented.  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 He was not felt to be a Coumadin candidate because of noncompliance. He has history of polysubstance abuse including cocaine, THC, heroin, and alcohol. He has medical diagnoses of dyslipidemia hypertension, diabetes, and cardiomyopathy. 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 He had percutaneous stent intervention in 2014 of the lower extremity. He has had femoral/tibial bypass grafting.  Renal function was last checked 04/16/15 and was normal except for mildly elevated BUN of 26. Lipids revealed an LDL of 128 and 08/23/15 on atorvastatin 40 mg. He has a history of rhabdomyolysis. A1c was nondiabetic at 5.8% on 08/23/15.  Review of systems: Dementia invalidated responses. Every answer was "good" and "yes mamm ";with the replies he would shake his head up and down. He was unable to give the date.  Physical exam:  Pertinent or positive findings: He appears older than his stated age. Arcus senilis is present. He has a mustache. He has only 2 lower teeth left and is otherwise edentulous. The right upper extremity is flexed across the chest. The fingers show both flexion and extension contractions. The skin over the shins is shiny and dry. Pedal pulses in the right lower extremity are decreased.R BKA.  General appearance:Adequately nourished; no acute distress , increased work of breathing is present.   Lymphatic: No lymphadenopathy about the head, neck, axilla . Eyes: No conjunctival inflammation or lid edema is present. There is no scleral icterus. Ears:  External ear exam shows no significant lesions or deformities.    Nose:  External nasal examination shows no deformity or inflammation. Nasal mucosa are pink and moist without lesions ,exudates Oral exam: lips and gums are healthy appearing.There is no oropharyngeal erythema or exudate . Neck:  No thyromegaly, masses, tenderness noted.    Heart:  Normal rate and regular rhythm. S1 and S2 normal without gallop, murmur, click, rub .  Lungs:Chest clear to auscultation without wheezes, rhonchi,rales , rubs. Abdomen:Bowel sounds are normal. Abdomen is soft and nontender with no organomegaly, hernias,masses. GU: deferred  RLE:  No cyanosis, clubbing,edema  Neurologic exam : Strength decreased in R upper & lower extremities Balance,Rhomberg,finger to nose testing could not be completed due to clinical state Skin: Warm & dry w/o tenting. No significant lesions or rash.  See summary under each active problem in the Problem List with associated updated therapeutic plan

## 2016-03-03 LAB — MICROALBUMIN, URINE: MICROALB UR: 197

## 2016-03-11 LAB — HM DIABETES EYE EXAM

## 2016-04-10 LAB — CBC AND DIFFERENTIAL
HEMATOCRIT: 41 % (ref 41–53)
HEMOGLOBIN: 12.8 g/dL — AB (ref 13.5–17.5)
Platelets: 255 10*3/uL (ref 150–399)
WBC: 4.5 10^3/mL

## 2016-04-10 LAB — BASIC METABOLIC PANEL
BUN: 13 mg/dL (ref 4–21)
CREATININE: 1 mg/dL (ref 0.6–1.3)
Glucose: 94 mg/dL
POTASSIUM: 4.5 mmol/L (ref 3.4–5.3)
Sodium: 139 mmol/L (ref 137–147)

## 2016-04-10 LAB — HEMOGLOBIN A1C: Hemoglobin A1C: 6.2

## 2016-04-10 LAB — LIPID PANEL
CHOLESTEROL: 144 mg/dL (ref 0–200)
HDL: 50 mg/dL (ref 35–70)
LDL Cholesterol: 85 mg/dL
TRIGLYCERIDES: 45 mg/dL (ref 40–160)

## 2016-04-10 LAB — TSH: TSH: 1.01 u[IU]/mL (ref 0.41–5.90)

## 2016-04-10 LAB — VITAMIN D 25 HYDROXY (VIT D DEFICIENCY, FRACTURES): Vit D, 25-Hydroxy: 27.07

## 2016-05-26 ENCOUNTER — Encounter: Payer: Self-pay | Admitting: Internal Medicine

## 2016-05-26 ENCOUNTER — Non-Acute Institutional Stay (SKILLED_NURSING_FACILITY): Payer: Medicare Other | Admitting: Internal Medicine

## 2016-05-26 DIAGNOSIS — F0280 Dementia in other diseases classified elsewhere without behavioral disturbance: Secondary | ICD-10-CM

## 2016-05-26 DIAGNOSIS — N183 Chronic kidney disease, stage 3 unspecified: Secondary | ICD-10-CM

## 2016-05-26 DIAGNOSIS — I10 Essential (primary) hypertension: Secondary | ICD-10-CM | POA: Diagnosis not present

## 2016-05-26 DIAGNOSIS — F418 Other specified anxiety disorders: Secondary | ICD-10-CM

## 2016-05-26 DIAGNOSIS — I5042 Chronic combined systolic (congestive) and diastolic (congestive) heart failure: Secondary | ICD-10-CM

## 2016-05-26 NOTE — Patient Instructions (Signed)
See assessment and plan under each diagnosis in the problem list and acutely for this visit 

## 2016-05-26 NOTE — Assessment & Plan Note (Signed)
Continue present psychotropic regimen as he is stable clinically

## 2016-05-26 NOTE — Assessment & Plan Note (Signed)
No change as clinically stable.

## 2016-05-26 NOTE — Progress Notes (Signed)
Facility Location: [facility]  Room Number: 121 B  Code Status: Full Code   This is a nursing facility follow up of chronic medical diagnoses  Interim medical record and care since last [place] visit was updated with review of diagnostic studies and change in clinical status since last visit were documented.  HPI: [employer] NP evaluation  04/23/16 was reviewed; patient was compliant with the medications without any acute symptoms. He has resisted assistance with personal care needs and having labs drawn. 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 [place] has seen him. He is presently on Risperdal, rectal, and cell telegram. The last psych eval was 03/24/16. Risperdal was for psychosis, Lamictal for mood disorder, and Celexa for depression. 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 Medical history is long and complicated. 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 He also has a history of polysubstance abuse including cocaine, heroin, alcohol, and marijuana. Surgeries include percutaneous stent intervention ;femoral/tibial bypass grafting, AKA and abdominal aortogram on at least 3 occasions. Last labs were 04/10/16. Chemistries, electrolytes, and lipids were excellent. A1c was prediabetic at 6.2%. He did exhibit a minimal anemia with hemoglobin 12.8.  Review of systems: Aphasia limited communication. Responses tended to be monosyllabic and virtually unintelligible. He seemed to indicate that he has no active issues with special attention to cardiopulmonary, neuromuscular, or mood.  Constitutional: No fever,significant weight change, fatigue  Eyes: No redness, discharge, pain, vision change ENT/mouth: No nasal congestion,  purulent discharge, earache,change in  hearing ,sore throat  Cardiovascular: No chest pain, palpitations,paroxysmal nocturnal dyspnea, claudication, edema  Respiratory: No cough, sputum production,hemoptysis, DOE , significant snoring,apnea  Gastrointestinal: No heartburn,dysphagia,abdominal pain, nausea / vomiting,rectal bleeding, melena,change in bowels Genitourinary: No dysuria,hematuria, pyuria,  incontinence, nocturia Musculoskeletal: No joint stiffness, joint swelling, weakness,pain Dermatologic: No rash, pruritus, change in appearance of skin Neurologic: No dizziness,headache,syncope, seizures, numbness , tingling Psychiatric: No significant anxiety , depression, insomnia, anorexia Endocrine: No change in hair/skin/ nails, excessive thirst, excessive hunger, excessive urination  Hematologic/lymphatic: No significant bruising, lymphadenopathy,abnormal bleeding Allergy/immunology: No itchy/ watery eyes, significant sneezing, urticaria, angioedema  Physical exam:  Pertinent or positive findings: He sits in a wheelchair but can self propel. He has pattern alopecia. Bilateral ptosis is present. He has dense arcus senilis. He has a goatee and beard. He has only 2 lower teeth. He has decreased extension of the fingers of right hand. The right upper extremity is flexed across chest. Trace pedal edema. Pedal pulses are decreased. He has an AKA on the left.  General appearance:Adequately nourished; no acute distress , increased work of breathing is present.   Lymphatic: No lymphadenopathy about the head, neck, axilla . Eyes: No conjunctival inflammation or lid edema is present. There is no scleral icterus. Ears:  External ear exam shows no significant lesions or deformities.   Nose:  External nasal examination shows no deformity or inflammation. Nasal mucosa are pink and moist without lesions ,exudates Oral exam: lips and gums are healthy appearing.There is no oropharyngeal erythema or exudate . Neck:  No thyromegaly, masses, tenderness  noted.    Heart:  Normal rate and regular rhythm. S1 and S2 normal without gallop, murmur, click, rub .  Lungs:Chest clear to auscultation without wheezes, rhonchi,rales , rubs. Abdomen:Bowel sounds are normal. Abdomen is soft and nontender with no organomegaly, hernias,masses. GU: deferred  Extremities:  No cyanosis, clubbing Neurologic exam : Balance,Rhomberg,finger to nose testing could not be completed due to clinical state Skin: Warm & dry w/o tenting. No significant lesions or rash.  See summary under each active problem in the Problem List with associated updated therapeutic plan

## 2016-05-26 NOTE — Assessment & Plan Note (Addendum)
BP adequately controlled; he tends to get anxious with interviews/exams. No change in antihypertensive medications BMET WNL 04/10/16

## 2016-05-27 NOTE — Assessment & Plan Note (Signed)
05/26/16 clinically compensated

## 2016-05-27 NOTE — Assessment & Plan Note (Signed)
04/10/16 creatinine and BUN normal; decreased muscle mass may contribute to the low creatinine

## 2016-08-06 ENCOUNTER — Encounter: Payer: Self-pay | Admitting: Internal Medicine

## 2016-08-06 ENCOUNTER — Non-Acute Institutional Stay (SKILLED_NURSING_FACILITY): Payer: Medicare Other | Admitting: Internal Medicine

## 2016-08-06 DIAGNOSIS — H578 Other specified disorders of eye and adnexa: Secondary | ICD-10-CM

## 2016-08-06 DIAGNOSIS — I10 Essential (primary) hypertension: Secondary | ICD-10-CM | POA: Diagnosis not present

## 2016-08-06 DIAGNOSIS — I6930 Unspecified sequelae of cerebral infarction: Secondary | ICD-10-CM

## 2016-08-06 DIAGNOSIS — R6889 Other general symptoms and signs: Secondary | ICD-10-CM

## 2016-08-06 DIAGNOSIS — D509 Iron deficiency anemia, unspecified: Secondary | ICD-10-CM

## 2016-08-06 DIAGNOSIS — F418 Other specified anxiety disorders: Secondary | ICD-10-CM

## 2016-08-06 NOTE — Progress Notes (Signed)
NURSING HOME LOCATION:  [facility] ROOM NUMBER:  121-B  CODE STATUS:  Full Code  PCP:  Hendricks Limes, MD  [place] [address]  This is a nursing facility follow up of chronic medical diagnoses  Interim medical record and care since last [place] visit was updated with review of diagnostic studies and change in clinical status since last visit were documented.  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 He is on Risperdal for psychosis, Lamictal for mood disorder & on Celexa for depression. No behavioral component reported. Psychiatry feels medication regimen should not be changed as this could undermine his stability. Medical issues include BPH, urinary retention, peripheral vascular disease, hypertension, diabetes, cardio myopathy with congestive heart failure, renal failure. Remotely he has a history of polysubstance abuse including cocaine, heroin, alcohol,& marijuana. He has had percutaneous stent intervention of the PVD and femoral/tibial bypass grafting. Last labs on record were 04/10/16. At that time mild anemia was present with a hemoglobin of 12.8. He is on iron supplement.He has a history of adenomatous colon polyp He has resisted having monitoring labs done and is very independent, declining assistance in activities of daily living.  Review of systems: The patient's dementia and post CVA residua invalidate review of systems. He was unable to give me the date. His responses were typically monosyllabic and "no". He did state that he has itchy eyes but no other extrinsic symptoms. Constitutional: No fever,significant weight change, fatigue  Eyes: No redness, discharge, pain, vision change ENT/mouth: No nasal congestion,  purulent discharge, earache,change in hearing ,sore throat  Cardiovascular: No chest pain, palpitations,paroxysmal  nocturnal dyspnea, claudication, edema  Respiratory: No cough, sputum production,hemoptysis, DOE , significant snoring,apnea  Gastrointestinal: No heartburn,dysphagia,abdominal pain, nausea / vomiting,rectal bleeding, melena,change in bowels Genitourinary: No dysuria,hematuria, pyuria,  incontinence, nocturia Musculoskeletal: No joint stiffness, joint swelling, weakness,pain Dermatologic: No rash, pruritus, change in appearance of skin Neurologic: No dizziness,headache,syncope, seizures, numbness , tingling Psychiatric: No significant anxiety , depression, insomnia, anorexia Endocrine: No change in hair/skin/ nails, excessive thirst, excessive hunger, excessive urination  Hematologic/lymphatic: No significant bruising, lymphadenopathy,abnormal bleeding Allergy/immunology: No significant sneezing, urticaria, angioedema  Physical exam:  Pertinent or positive findings: His hair is cut very short. He has drooping of the left mouth. He has marked ptosis on the right. His responses are somewhat stammering & dysarthric. He has only 2 lower teeth remaining. Breath sounds are slightly decreased. 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 He has partial contractures of the right hand. He can extend the fingers except for the fifth digit. With extension there is actually hyperextension of the second and third fingers of the right hand. There is slight decreased range of motion of the right upper extremity compared to the left. He exhibits weakness in the right lower extremity. Pedal pulses in RLE are decreased.  General appearance:Adequately nourished; no acute distress , increased work of breathing is present.   Lymphatic: No lymphadenopathy about the head, neck, axilla . Eyes: No conjunctival inflammation or lid edema is present. There is no scleral icterus. Ears:  External ear exam shows no significant lesions or deformities.   Nose:  External nasal examination shows no deformity or inflammation. Nasal mucosa are pink  and moist without lesions ,exudates Oral exam: lips and gums are healthy appearing.There is no oropharyngeal erythema or exudate . Neck:  No thyromegaly, masses, tenderness noted.  Heart:  Normal rate and regular rhythm. S1 and S2 normal without gallop, murmur, click, rub .  Lungs: without wheezes, rhonchi,rales , rubs. Abdomen:Bowel sounds are normal. Abdomen is soft and nontender with no organomegaly, hernias,masses. GU: deferred  Extremities:  No cyanosis, clubbing,edema  Neurologic exam : Balance,Rhomberg,finger to nose testing could not be completed due to clinical state Skin: Warm & dry w/o tenting. No significant lesions or rash.  See summary under each active problem in the Problem List with associated updated therapeutic plan

## 2016-08-07 NOTE — Patient Instructions (Signed)
See assessment and plan under each diagnosis in the problem list and acutely for this visit 

## 2016-08-07 NOTE — Assessment & Plan Note (Signed)
Clinically stable without progression

## 2016-08-07 NOTE — Assessment & Plan Note (Signed)
No change in medication regimen indicated

## 2016-08-07 NOTE — Assessment & Plan Note (Signed)
CBC & dif 

## 2016-08-07 NOTE — Assessment & Plan Note (Addendum)
BP adequately controlled; no change in antihypertensive medications  

## 2016-08-08 LAB — CBC AND DIFFERENTIAL
HCT: 45 (ref 41–53)
Hemoglobin: 14 (ref 13.5–17.5)
NEUTROS ABS: 2
PLATELETS: 298 (ref 150–399)
WBC: 4.3

## 2016-08-08 LAB — BASIC METABOLIC PANEL
BUN: 19 (ref 4–21)
CREATININE: 1.1 (ref 0.6–1.3)
GLUCOSE: 89
Potassium: 4.3 (ref 3.4–5.3)
SODIUM: 141 (ref 137–147)

## 2016-10-01 ENCOUNTER — Non-Acute Institutional Stay (SKILLED_NURSING_FACILITY): Payer: Medicare Other

## 2016-10-01 DIAGNOSIS — Z0000 Encounter for general adult medical examination without abnormal findings: Secondary | ICD-10-CM | POA: Diagnosis not present

## 2016-10-01 NOTE — Patient Instructions (Signed)
[name] , Thank you for taking time to come for your Medicare Wellness Visit. I appreciate your ongoing commitment to your health goals. Please review the following plan we discussed and let me know if I can assist you in the future.   Screening recommendations/referrals: Colonoscopy excluded, pt is long term Recommended yearly ophthalmology/optometry visit for glaucoma screening and checkup Recommended yearly dental visit for hygiene and checkup  Vaccinations: Influenza vaccine due 2018 flu season Pneumococcal vaccine up to date Tdap vaccine due, ordered Shingles vaccine not in records  Advanced directives: Healthcare power of attorney in chart. Living will needed for chart  Conditions/risks identified: None  Next appointment: Dr. Linna Darner makes rounds  Preventive Care 40-64 Years, Male Preventive care refers to lifestyle choices and visits with your health care provider that can promote health and wellness. What does preventive care include?  A yearly physical exam. This is also called an annual well check.  Dental exams once or twice a year.  Routine eye exams. Ask your health care provider how often you should have your eyes checked.  Personal lifestyle choices, including:  Daily care of your teeth and gums.  Regular physical activity.  Eating a healthy diet.  Avoiding tobacco and drug use.  Limiting alcohol use.  Practicing safe sex.  Taking low-dose aspirin every day starting at age 61. What happens during an annual well check? The services and screenings done by your health care provider during your annual well check will depend on your age, overall health, lifestyle risk factors, and family history of disease. Counseling  Your health care provider may ask you questions about your:  Alcohol use.  Tobacco use.  Drug use.  Emotional well-being.  Home and relationship well-being.  Sexual activity.  Eating habits.  Work and work  Statistician. Screening  You may have the following tests or measurements:  Height, weight, and BMI.  Blood pressure.  Lipid and cholesterol levels. These may be checked every 5 years, or more frequently if you are over 19 years old.  Skin check.  Lung cancer screening. You may have this screening every year starting at age 64 if you have a 30-pack-year history of smoking and currently smoke or have quit within the past 15 years.  Fecal occult blood test (FOBT) of the stool. You may have this test every year starting at age 46.  Flexible sigmoidoscopy or colonoscopy. You may have a sigmoidoscopy every 5 years or a colonoscopy every 10 years starting at age 37.  Prostate cancer screening. Recommendations will vary depending on your family history and other risks.  Hepatitis C blood test.  Hepatitis B blood test.  Sexually transmitted disease (STD) testing.  Diabetes screening. This is done by checking your blood sugar (glucose) after you have not eaten for a while (fasting). You may have this done every 1-3 years. Discuss your test results, treatment options, and if necessary, the need for more tests with your health care provider. Vaccines  Your health care provider may recommend certain vaccines, such as:  Influenza vaccine. This is recommended every year.  Tetanus, diphtheria, and acellular pertussis (Tdap, Td) vaccine. You may need a Td booster every 10 years.  Zoster vaccine. You may need this after age 68.  Pneumococcal 13-valent conjugate (PCV13) vaccine. You may need this if you have certain conditions and have not been vaccinated.  Pneumococcal polysaccharide (PPSV23) vaccine. You may need one or two doses if you smoke cigarettes or if you have certain conditions. Talk to  your health care provider about which screenings and vaccines you need and how often you need them. This information is not intended to replace advice given to you by your health care provider. Make  sure you discuss any questions you have with your health care provider. Document Released: 02/22/2015 Document Revised: 10/16/2015 Document Reviewed: 11/27/2014 Elsevier Interactive Patient Education  2017 [place] Prevention in the Home Falls can cause injuries. They can happen to people of all ages. There are many things you can do to make your home safe and to help prevent falls. What can I do on the outside of my home?  Regularly fix the edges of walkways and driveways and fix any cracks.  Remove anything that might make you trip as you walk through a door, such as a raised step or threshold.  Trim any bushes or trees on the path to your home.  Use bright outdoor lighting.  Clear any walking paths of anything that might make someone trip, such as rocks or tools.  Regularly check to see if handrails are loose or broken. Make sure that both sides of any steps have handrails.  Any raised decks and porches should have guardrails on the edges.  Have any leaves, snow, or ice cleared regularly.  Use sand or salt on walking paths during winter.  Clean up any spills in your garage right away. This includes oil or grease spills. What can I do in the bathroom?  Use night lights.  Install grab bars by the toilet and in the tub and shower. Do not use towel bars as grab bars.  Use non-skid mats or decals in the tub or shower.  If you need to sit down in the shower, use a plastic, non-slip stool.  Keep the floor dry. Clean up any water that spills on the floor as soon as it happens.  Remove soap buildup in the tub or shower regularly.  Attach bath mats securely with double-sided non-slip rug tape.  Do not have throw rugs and other things on the floor that can make you trip. What can I do in the bedroom?  Use night lights.  Make sure that you have a light by your bed that is easy to reach.  Do not use any sheets or blankets that are too big for your bed. They should  not hang down onto the floor.  Have a firm chair that has side arms. You can use this for support while you get dressed.  Do not have throw rugs and other things on the floor that can make you trip. What can I do in the kitchen?  Clean up any spills right away.  Avoid walking on wet floors.  Keep items that you use a lot in easy-to-reach places.  If you need to reach something above you, use a strong step stool that has a grab bar.  Keep electrical cords out of the way.  Do not use floor polish or wax that makes floors slippery. If you must use wax, use non-skid floor wax.  Do not have throw rugs and other things on the floor that can make you trip. What can I do with my stairs?  Do not leave any items on the stairs.  Make sure that there are handrails on both sides of the stairs and use them. Fix handrails that are broken or loose. Make sure that handrails are as long as the stairways.  Check any carpeting to make sure that it  is firmly attached to the stairs. Fix any carpet that is loose or worn.  Avoid having throw rugs at the top or bottom of the stairs. If you do have throw rugs, attach them to the floor with carpet tape.  Make sure that you have a light switch at the top of the stairs and the bottom of the stairs. If you do not have them, ask someone to add them for you. What else can I do to help prevent falls?  Wear shoes that:  Do not have high heels.  Have rubber bottoms.  Are comfortable and fit you well.  Are closed at the toe. Do not wear sandals.  If you use a stepladder:  Make sure that it is fully opened. Do not climb a closed stepladder.  Make sure that both sides of the stepladder are locked into place.  Ask someone to hold it for you, if possible.  Clearly mark and make sure that you can see:  Any grab bars or handrails.  First and last steps.  Where the edge of each step is.  Use tools that help you move around (mobility aids) if they are  needed. These include:  Canes.  Walkers.  Scooters.  Crutches.  Turn on the lights when you go into a dark area. Replace any light bulbs as soon as they burn out.  Set up your furniture so you have a clear path. Avoid moving your furniture around.  If any of your floors are uneven, fix them.  If there are any pets around you, be aware of where they are.  Review your medicines with your doctor. Some medicines can make you feel dizzy. This can increase your chance of falling. Ask your doctor what other things that you can do to help prevent falls. This information is not intended to replace advice given to you by your health care provider. Make sure you discuss any questions you have with your health care provider. Document Released: 11/22/2008 Document Revised: 07/04/2015 Document Reviewed: 03/02/2014 Elsevier Interactive Patient Education  2017 [employer].

## 2016-10-01 NOTE — Progress Notes (Signed)
Subjective:   Jeremy Howell is a 61 y.o. male who presents for an Initial Medicare Annual Wellness Visit at [facility] term SNF; incapacitated patient unable to answer questions appropriately      Objective:    Today's Vitals   10/01/16 0939  BP: 130/90  Pulse: 60  Temp: 97.8 F (36.6 C)  TempSrc: Oral  SpO2: 94%  Weight: 159 lb (72.1 kg)  Height: 5\' 7"  (1.702 m)   Body mass index is 24.9 kg/m.  Current Medications (verified) Outpatient Encounter Prescriptions as of 10/01/2016  Medication Sig  . acetaminophen (TYLENOL) 325 MG tablet Take 650 mg by mouth every 6 (six) hours as needed for mild pain.   . bisacodyl (DULCOLAX) 10 MG suppository Place 10 mg rectally daily as needed for moderate constipation (for constipation not relieved by milk of magnesia).   . carvedilol (COREG) 25 MG tablet Take 12.5 mg by mouth 2 (two) times daily. Hold for HR<65  . Cholecalciferol (VITAMIN D3) 50000 units TABS Take by mouth. Take 1 tablet by mouth once monthly  . citalopram (CELEXA) 20 MG tablet Take 20 mg by mouth every morning.  . docusate sodium (COLACE) 100 MG capsule Take 100 mg by mouth 2 (two) times daily.  . ferrous sulfate 325 (65 FE) MG tablet Take 325 mg by mouth daily with breakfast.   . finasteride (PROSCAR) 5 MG tablet Take 5 mg by mouth daily.  . furosemide (LASIX) 20 MG tablet Take 20 mg by mouth daily.  Marland Kitchen lamoTRIgine (LAMICTAL) 100 MG tablet Take 100 mg by mouth daily.  . magnesium hydroxide (MILK OF MAGNESIA) 400 MG/5ML suspension Take 30 mLs by mouth every 3 (three) days as needed for mild constipation.   Marland Kitchen omeprazole (PRILOSEC) 40 MG capsule Take 40 mg by mouth daily.  . polyethylene glycol (MIRALAX / GLYCOLAX) packet Take 17 g by mouth daily.  Marland Kitchen Propylene Glycol (SYSTANE BALANCE) 0.6 % SOLN Place 1 drop into both eyes 2 (two) times daily.   . risperiDONE (RISPERDAL) 1 MG tablet Take 1 mg by mouth at bedtime.  . rosuvastatin (CRESTOR) 40 MG tablet Take 40 mg by  mouth daily.  Marland Kitchen saccharomyces boulardii (FLORASTOR) 250 MG capsule Take 250 mg by mouth 2 (two) times daily.  . tamsulosin (FLOMAX) 0.4 MG CAPS capsule Take 1 capsule (0.4 mg total) by mouth daily after breakfast.  . vitamin C (ASCORBIC ACID) 500 MG tablet Take 500 mg by mouth daily with breakfast. To be given with iron   No facility-administered encounter medications on file as of 10/01/2016.     Allergies (verified) Almond oil   History: Past Medical History:  Diagnosis Date  . Acute renal failure ([place])   . 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 Anemia   . BPH (benign prostatic hyperplasia)   . Cardiomyopathy    EF 20-25% by echo 01/2010 with severe LVH felt secondary to substance abuse (no ischemic workup)  . CHF (congestive heart failure) ([place])   . Dependent for walking   . Diabetes mellitus   . Gangrene of foot ([place])   . HTN (hypertension)   . Hyperkalemia   . Hyperlipidemia   . Noncompliance   . Peripheral vascular disease ([place])   . Pneumonia   . Polysubstance abuse    Reported hx of cocaine, THC, heroin, EtOH abuse  . Streptococcal pneumonia ([place])   . 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 Urinary retention   . Wheelchair bound    Past Surgical History:  Procedure Laterality Date  . ABDOMINAL ANGIOGRAM  02/12/2012   Procedure: ABDOMINAL ANGIOGRAM;  Surgeon: Elam Dutch, MD;  Location: [facility] CATH LAB;  Service: Cardiovascular;;  . ABDOMINAL AORTAGRAM N/A 10/16/2011   Procedure: ABDOMINAL Maxcine Ham;  Surgeon: Elam Dutch, MD;  Location: [facility] CATH LAB;  Service: Cardiovascular;  Laterality: N/A;  . ABDOMINAL AORTAGRAM N/A 10/14/2012   Procedure: ABDOMINAL Maxcine Ham;  Surgeon: Elam Dutch, MD;  Location: [facility] CATH LAB;  Service: Cardiovascular;  Laterality: N/A;  . AMPUTATION  08/15/2011   Procedure: AMPUTATION ABOVE KNEE;  Surgeon: Elam Dutch, MD;   Location: [place];  Service: Vascular;  Laterality: Left;  Amputation End:  2992  . COLONOSCOPY N/A 08/20/2015   Procedure: COLONOSCOPY;  Surgeon: Jerene Bears, MD;  Location: [facility] ENDOSCOPY;  Service: Gastroenterology;  Laterality: N/A;  . FEMORAL-TIBIAL BYPASS GRAFT  12/23/2011   Procedure: BYPASS GRAFT FEMORAL-TIBIAL ARTERY;  Surgeon: Elam Dutch, MD;  Location: [facility] OR;  Service: Vascular;  Laterality: Right;  Right Popliteal - Tibial Artery Bypass using Reversed Saphenous vein  . JOINT REPLACEMENT     BTHA  . LOWER EXTREMITY ANGIOGRAM N/A 02/12/2012   Procedure: LOWER EXTREMITY ANGIOGRAM;  Surgeon: Elam Dutch, MD;  Location: [facility] CATH LAB;  Service: Cardiovascular;  Laterality: N/A;  . PERCUTANEOUS STENT INTERVENTION  02/12/2012   Procedure: PERCUTANEOUS STENT INTERVENTION;  Surgeon: Elam Dutch, MD;  Location: [facility] CATH LAB;  Service: Cardiovascular;;  . TOTAL HIP ARTHROPLASTY Bilateral    Family History  Problem Relation Age of Onset  . Stroke Brother   . Emphysema Brother   . Stroke Mother   . Heart disease Mother   . Diabetes Mother   . Hepatitis Mother   . Clotting disorder Mother   . Alzheimer's disease Mother   . Pneumonia Father    Social History   Occupational History  . disabled Unemployed   Social History Main Topics  . Smoking status: Former Smoker    Years: 15.00    Types: Cigarettes    Quit date: 08/10/2011  . Smokeless tobacco: Never Used     Comment: declined  . Alcohol use No  . Drug use: No     Comment: + previous history  . Sexual activity: Not on file   Tobacco Counseling Counseling given: Not Answered   Activities of Daily Living In your present state of health, do you have any difficulty performing the following activities: 10/01/2016  Hearing? Y  Vision? N  Difficulty concentrating or making decisions? Y  Walking or climbing stairs? Y  Dressing or bathing? Y  Doing errands, shopping? Y  Preparing Food and eating ? Y  Using the Toilet? Y   In the past six months, have you accidently leaked urine? Y  Do you have problems with loss of bowel control? Y  Managing your Medications? Y  Managing your Finances? Y  Housekeeping or managing your Housekeeping? Y  Some recent data might be hidden    Immunizations and Health Maintenance Immunization History  Administered Date(s) Administered  . Influenza Split 10/24/2011  . Influenza, High Dose Seasonal PF 11/07/2015  . Influenza-Unspecified 11/09/2012, 11/21/2014  . PPD Test 07/23/2011, 08/19/2011  . Pneumococcal Conjugate-13 11/08/2015  . Pneumococcal Polysaccharide-23 10/24/2011  . Pneumococcal-Unspecified 08/24/2011, 09/12/2012   Health Maintenance Due  Topic Date Due  .  PNEUMOCOCCAL POLYSACCHARIDE VACCINE (2) 08/23/2016  . INFLUENZA VACCINE  09/09/2016    Patient Care Team: Hendricks Limes, MD as PCP - General (Internal Medicine)  Indicate any recent Medical Services you may have received from other than [facility] providers in the past year (date may be approximate).    Assessment:   This is a routine wellness examination for [place].   Hearing/Vision screen No exam data present  Dietary issues and exercise activities discussed: Current Exercise Habits: The patient does not participate in regular exercise at present, Exercise limited by: neurologic condition(s)  Goals    None     Depression Screen PHQ 2/9 Scores 10/01/2016  Exception Documentation Medical reason    Fall Risk Fall Risk  10/01/2016  Falls in the past year? No    Cognitive Function: MMSE - Mini Mental State Exam 10/01/2016  Not completed: Unable to complete        Screening Tests Health Maintenance  Topic Date Due  . PNEUMOCOCCAL POLYSACCHARIDE VACCINE (2) 08/23/2016  . INFLUENZA VACCINE  09/09/2016  . TETANUS/TDAP  06/09/2018 (Originally 12/19/1974)  . HEMOGLOBIN A1C  10/11/2016  . URINE MICROALBUMIN  03/03/2017  . OPHTHALMOLOGY EXAM  03/11/2017  . COLONOSCOPY  08/19/2025  .  Hepatitis C Screening  Completed  . HIV Screening  Completed        Plan:    I have personally reviewed and addressed the Medicare Annual Wellness questionnaire and have noted the following in the patient's chart:  A. Medical and social history B. Use of alcohol, tobacco or illicit drugs  C. Current medications and supplements D. Functional ability and status E.  Nutritional status F.  Physical activity G. Advance directives H. List of other physicians I.  Hospitalizations, surgeries, and ER visits in previous 12 months J.  [place] to include hearing, vision, cognitive, depression L. Referrals and appointments - none  In addition, I am unable to review and discuss with incapacitated patient certain preventive protocols, quality metrics, and best practice recommendations. A written personalized care plan for preventive services as well as general preventive health recommendations were provided to patient.   See attached scanned questionnaire for additional information.   Signed,   Rich Reining, RN Nurse Health Advisor   Quick Notes   Health Maintenance: TDAP due     Abnormal Screen: Unable to complete mental exam      Patient Concerns: None     Nurse Concerns: None  I have personally reviewed the health advisor's clinical note, was available for consultation, and agree with the assessment and plan as written. Hendricks Limes M.D., FACP, [facility]

## 2016-10-20 LAB — HEMOGLOBIN A1C: Hemoglobin A1C: 6

## 2016-11-19 ENCOUNTER — Encounter: Payer: Self-pay | Admitting: Internal Medicine

## 2016-11-19 ENCOUNTER — Non-Acute Institutional Stay (SKILLED_NURSING_FACILITY): Payer: Medicare Other | Admitting: Internal Medicine

## 2016-11-19 DIAGNOSIS — F0280 Dementia in other diseases classified elsewhere without behavioral disturbance: Secondary | ICD-10-CM

## 2016-11-19 DIAGNOSIS — I6930 Unspecified sequelae of cerebral infarction: Secondary | ICD-10-CM | POA: Diagnosis not present

## 2016-11-19 DIAGNOSIS — I10 Essential (primary) hypertension: Secondary | ICD-10-CM | POA: Diagnosis not present

## 2016-11-19 DIAGNOSIS — R7303 Prediabetes: Secondary | ICD-10-CM | POA: Diagnosis not present

## 2016-11-19 NOTE — Assessment & Plan Note (Signed)
Right-sided weakness is stable

## 2016-11-19 NOTE — Assessment & Plan Note (Signed)
A1c is current with a value of 6%, confirming prediabetes No medication indicated at this time, metformin could be considered if A1c increases as creatinine is normal

## 2016-11-19 NOTE — Patient Instructions (Addendum)
See assessment and plan under each diagnosis in the problem list and acutely for this visit Total time31  minutes; greater than 50% of the visit spent counseling patient and coordinating care for problems addressed at this encounter  

## 2016-11-19 NOTE — Progress Notes (Signed)
NURSING HOME LOCATION:  [facility] ROOM NUMBER:  [id number]  CODE STATUS:  Full Code  PCP:  Hendricks Limes, MD  [place] [address]  This is a nursing facility follow up of chronic medical diagnoses Interim medical record and care since last [place] visit was updated with review of diagnostic studies and change in clinical status since last visit were documented.  HPI: The patient is a long-term resident of the SNF. Medical diagnoses include hypertension, cardiomyopathy with congestive heart failure, diabetes, history of stroke, depression, iron deficiency anemia, symptomatic prostatism with urinary retention, peripheral neuropathy, schizophrenia, history polysubstance abuse  (cocaine, THC, heroin, and alcohol)GERD, and dyslipidemia. Surgeries include AKA, percutaneous stent intervention, femoral-tibial bypass grafting, abdominal aortogram 3.  Family history of medical issues is extensive including strokes, diabetes, and heart disease. Labs 4 months ago revealed no significant abnormality. A1c was 6%, prediabetic.  He is a former smoker having quit in 2013. He no longer drinks but does have a history of abuse.  Review of systems: Dementia invalidated responses. Other than "eye" (? pain)he answered "no" to all coronaries.  Constitutional: No fever,significant weight change, fatigue  Eyes: No redness, discharge, vision change ENT/mouth: No nasal congestion,  purulent discharge, earache,change in hearing ,sore throat  Cardiovascular: No chest pain, palpitations,paroxysmal nocturnal dyspnea, claudication, edema  Respiratory: No cough, sputum production,hemoptysis, DOE , significant snoring,apnea  Gastrointestinal: No heartburn,dysphagia,abdominal pain, nausea / vomiting,rectal bleeding, melena,change in bowels Genitourinary: No dysuria,hematuria, pyuria,  incontinence, nocturia Musculoskeletal: No joint stiffness, joint swelling, weakness,pain Dermatologic:  No rash, pruritus, change in appearance of skin Neurologic: No dizziness,headache,syncope, seizures, numbness , tingling Psychiatric: No significant anxiety , depression, insomnia, anorexia Endocrine: No change in hair/skin/ nails, excessive thirst, excessive hunger, excessive urination  Hematologic/lymphatic: No significant bruising, lymphadenopathy,abnormal bleeding Allergy/immunology: No itchy/ watery eyes, significant sneezing, urticaria, angioedema  Physical exam:  Pertinent or positive findings: The patient exhibits aphasic speech pattern with intermittent stuttering. There is a slight tremor of the mandible intermittently. Dense arcus senilis is present. Ptosis is present on the right. Extraocular motion is intact as is vision to direct confrontation on the left. Edentulous except for2 lower teeth. Heart rhythm and rate are slow and regular. Abdomen is protuberant. 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 The right upper extremity is flexed at the elbow with contractures of the fingers. He has decreased range of motion of the right upper extremity. The right upper and right lower extremity exhibit weakness to opposition. Pedal pulses are decreased in the right foot. He has scarring over the left shin.  General appearance:Adequately nourished; no acute distress , increased work of breathing is present.   Lymphatic: No lymphadenopathy about the head, neck, axilla . Eyes: No conjunctival inflammation or lid edema is present. There is no scleral icterus. Ears:  External ear exam shows no significant lesions or deformities.   Nose:  External nasal examination shows no deformity or inflammation. Nasal mucosa are pink and moist without lesions ,exudates Oral exam: lips and gums are healthy appearing.There is no oropharyngeal erythema or exudate . Neck:  No thyromegaly, masses, tenderness noted.    Heart:  No gallop, murmur, click, rub .  Lungs: without wheezes, rhonchi,rales , rubs. Abdomen:Bowel sounds are  normal. Abdomen is soft and nontender with no organomegaly, hernias,masses. GU: deferred  Extremities:  No cyanosis, clubbing,edema  Neurologic exam : Balance,Rhomberg,finger to nose testing could not be completed due to clinical state Skin: Warm & dry w/o tenting. No significant  lesions or rash.  See summary under each active problem in the Problem List with associated updated therapeutic plan

## 2016-11-19 NOTE — Assessment & Plan Note (Signed)
His dementia makes evaluating his complaint of "eye" difficult. On exam there was no sign of conjunctivitis and extraocular motion and vision to confrontation were normal.

## 2016-11-19 NOTE — Assessment & Plan Note (Signed)
BP controlled; no change in antihypertensive medications  

## 2017-02-04 LAB — CBC AND DIFFERENTIAL
HCT: 40 — AB (ref 41–53)
HEMOGLOBIN: 13 — AB (ref 13.5–17.5)
NEUTROS ABS: 1
PLATELETS: 270 (ref 150–399)
WBC: 3.3

## 2017-02-04 LAB — LIPID PANEL
CHOLESTEROL: 135 (ref 0–200)
HDL: 39 (ref 35–70)
LDL CALC: 82
LDL/HDL RATIO: 3.5
Triglycerides: 71 (ref 40–160)

## 2017-02-04 LAB — BASIC METABOLIC PANEL
BUN: 14 (ref 4–21)
Creatinine: 1.1 (ref 0.6–1.3)
Glucose: 82
Potassium: 4.4 (ref 3.4–5.3)
Sodium: 145 (ref 137–147)

## 2017-02-04 LAB — HEPATIC FUNCTION PANEL
ALT: 31 (ref 10–40)
AST: 16 (ref 14–40)
Alkaline Phosphatase: 83 (ref 25–125)
Bilirubin, Total: 0.2

## 2017-02-04 LAB — VITAMIN D 25 HYDROXY (VIT D DEFICIENCY, FRACTURES): Vit D, 25-Hydroxy: 30.85

## 2017-02-04 LAB — VITAMIN B12: Vitamin B-12: 944

## 2017-02-04 LAB — HEMOGLOBIN A1C: HEMOGLOBIN A1C: 6.2

## 2017-02-04 LAB — TSH: TSH: 1.57 (ref 0.41–5.90)

## 2017-03-10 LAB — FECAL OCCULT BLOOD, GUAIAC: FECAL OCCULT BLD: NEGATIVE

## 2017-03-11 ENCOUNTER — Encounter: Payer: Self-pay | Admitting: Internal Medicine

## 2017-03-11 ENCOUNTER — Non-Acute Institutional Stay (SKILLED_NURSING_FACILITY): Payer: Medicare Other | Admitting: Internal Medicine

## 2017-03-11 DIAGNOSIS — E785 Hyperlipidemia, unspecified: Secondary | ICD-10-CM

## 2017-03-11 DIAGNOSIS — D509 Iron deficiency anemia, unspecified: Secondary | ICD-10-CM

## 2017-03-11 DIAGNOSIS — H1031 Unspecified acute conjunctivitis, right eye: Secondary | ICD-10-CM | POA: Diagnosis not present

## 2017-03-11 DIAGNOSIS — F339 Major depressive disorder, recurrent, unspecified: Secondary | ICD-10-CM | POA: Diagnosis not present

## 2017-03-11 DIAGNOSIS — I10 Essential (primary) hypertension: Secondary | ICD-10-CM | POA: Diagnosis not present

## 2017-03-11 DIAGNOSIS — R7303 Prediabetes: Secondary | ICD-10-CM

## 2017-03-11 NOTE — Patient Instructions (Signed)
See assessment and plan under each diagnosis in the problem list and acutely for this visit 

## 2017-03-11 NOTE — Assessment & Plan Note (Signed)
Clinically stable; no change. Psychiatric reevaluation @ next site visit

## 2017-03-11 NOTE — Progress Notes (Signed)
NURSING HOME LOCATION:  [facility] ROOM NUMBER:  121-B  CODE STATUS:  Full Code  PCP:  Hendricks Limes, MD  [place] [id number]  This is a nursing facility follow up for specific chronic medical diagnoses   Interim medical record and care since last [place] visit was updated with review of diagnostic studies and change in clinical status since last visit were documented.  HPI: He is a permanent resident here with diagnosis of hypertension, cardiomyopathy complicated by congestive heart failure, diabetes, history of stroke, iron deficiency anemia, depression, BPH with urinary retention, peripheral neuropathy, schizophrenia, GERD, dyslipidemia, and history of polysubstance abuse including cocaine, THC, heroin, and alcohol. AKA performed for his peripheral vascular disease . Also he has had  percutaneous stent intervention, femoral-tibial bypass grafting and multiple aortograms. Most recent labs were 02/04/17 ;he had minor anemia with hemoglobin 13 and hematocrit 40. A1c remain prediabetic at 6.2%. Lipids were essentially at goal, LDL was 82 Optimal value is < 70 because of vascular disease..  Review of systems: Dementia invalidated responses and dysarthria hindered communication. His only complaint was " eye is gone". With this he will point to and actually rub his right eye. He denies any associated constitutional or upper respiratory tract symptoms. Apparently also has had no vision change of an acute nature.   Constitutional: No fever,significant weight change, fatigue  ENT/mouth: No nasal congestion,  purulent discharge, earache,change in hearing ,sore throat  Cardiovascular: No chest pain, palpitations,paroxysmal nocturnal dyspnea, claudication, edema  Respiratory: No cough, sputum production,hemoptysis, DOE , significant snoring,apnea  Gastrointestinal: No heartburn,dysphagia,abdominal pain, nausea / vomiting,rectal bleeding, melena,change in  bowels Genitourinary: No dysuria,hematuria, pyuria,  incontinence, nocturia Musculoskeletal: No joint stiffness, joint swelling, weakness,pain Dermatologic: No rash, pruritus, change in appearance of skin Neurologic: No dizziness,headache,syncope, seizures, numbness , tingling Psychiatric: No significant anxiety , depression, insomnia, anorexia Endocrine: No change in hair/skin/ nails, excessive thirst, excessive hunger, excessive urination  Hematologic/lymphatic: No significant bruising, lymphadenopathy,abnormal bleeding Allergy/immunology: No itchy/ watery eyes, significant sneezing, urticaria, angioedema  Physical exam:  Pertinent or positive findings:as noted he has dysarthria and speech content is very difficult to discern. There is marked ptosis on the right. He has marked erythema of the conjunctiva of the right eye as well as the sclera. He has dense arcus senilis.Extraocular motions grossly intact. There is decreased medial rotation of the left eye with accommodation. With superior gaze there is slight lateral deviation of the left eye.  The maxilla is edentulous. He has multiple missing lower teeth. There is asymmetry of the nasolabial fold. Heart rhythm is slow but regular. He has low-grade diffuse rhonchi with expiration. 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 Pedal pulses are decreased on the right. The right upper extremity is flexed across his chest with contractures of the fingers. Surprisingly he is stronger in the right upper arm than the left upper extremity. There is weakness to opposition in the right lower extremity.  General appearance:Adequately nourished; no acute distress , increased work of breathing is present.   Lymphatic: No lymphadenopathy about the head, neck, axilla . Eyes: No conjunctival inflammation or lid edema is present. There is no scleral icterus. Ears:  External ear exam shows no significant lesions or deformities.   Nose:  External nasal examination shows no  deformity or inflammation. Nasal mucosa are pink and moist without lesions ,exudates Oral exam: lips and gums are healthy appearing.There is no oropharyngeal erythema or exudate . Neck:  No thyromegaly, masses,  tenderness noted.    Heart:  No gallop, murmur, click, rub .  Lungs: without wheezes, rhonchi,rales , rubs. Abdomen:Bowel sounds are normal. Abdomen is soft and nontender with no organomegaly, hernias,masses. GU: deferred  Extremities:  No cyanosis, clubbing,edema  Neurologic exam : Balance,Rhomberg,finger to nose testing could not be completed due to clinical state Skin: Warm & dry w/o tenting. No significant lesions or rash.  See summary under each active problem in the Problem List with associated updated therapeutic plan

## 2017-03-11 NOTE — Assessment & Plan Note (Signed)
Minimal anemia, essentially stable. Monitor for any active bleeding. Continue iron

## 2017-03-11 NOTE — Assessment & Plan Note (Signed)
BP controlled; no change in antihypertensive medications  

## 2017-03-11 NOTE — Assessment & Plan Note (Addendum)
Essentially at goal with LDL of 82 No change indicated in Crestor dose

## 2017-03-11 NOTE — Assessment & Plan Note (Signed)
A1c remains prediabetic at 6.2%. No change indicated

## 2017-04-16 LAB — HEMOGLOBIN A1C: Hemoglobin A1C: 6.2

## 2017-05-11 LAB — BASIC METABOLIC PANEL WITH GFR
BUN: 14 (ref 4–21)
Creatinine: 1.1 (ref 0.6–1.3)
Glucose: 77
Potassium: 4.8 (ref 3.4–5.3)
Sodium: 144 (ref 137–147)

## 2017-05-11 LAB — CBC AND DIFFERENTIAL
HCT: 39 — AB (ref 41–53)
Hemoglobin: 12.8 — AB (ref 13.5–17.5)
NEUTROS ABS: 2
PLATELETS: 295 (ref 150–399)
WBC: 3.4

## 2017-05-13 ENCOUNTER — Encounter: Payer: Self-pay | Admitting: Internal Medicine

## 2017-05-13 ENCOUNTER — Non-Acute Institutional Stay (SKILLED_NURSING_FACILITY): Payer: Medicare Other | Admitting: Internal Medicine

## 2017-05-13 DIAGNOSIS — D126 Benign neoplasm of colon, unspecified: Secondary | ICD-10-CM | POA: Diagnosis not present

## 2017-05-13 DIAGNOSIS — M6282 Rhabdomyolysis: Secondary | ICD-10-CM

## 2017-05-13 DIAGNOSIS — D509 Iron deficiency anemia, unspecified: Secondary | ICD-10-CM

## 2017-05-13 NOTE — Assessment & Plan Note (Addendum)
CK only if symptomatic

## 2017-05-13 NOTE — Assessment & Plan Note (Addendum)
Minimal progression of the anemia with normal iron stores 02/04/17. Hemoccult stool studies were negative in January. Significant GI pathology is unlikely based on these findings.

## 2017-05-13 NOTE — Assessment & Plan Note (Addendum)
02/04/17  Hgb 13/hematocrit 40; CBC 05/10/17 showed no significant progression Monitor for any GI blood loss. Repeat colonoscopy in 2022 in absence of significant anemia

## 2017-05-13 NOTE — Progress Notes (Signed)
    NURSING HOME LOCATION:  [facility] ROOM NUMBER:  121-B  CODE STATUS:  Full Code  PCP:  Hendricks Limes, MD  [place] [address]  This is a nursing facility follow up of chronic medical diagnoses.  Interim medical record and care since last [place] visit was updated with review of diagnostic studies and change in clinical status since last visit were documented.  HPI: He is a permanent resident at this facility with medical diagnoses of peripheral vascular disease, essential hypertension, chronic diastolic heart failure, vascular dementia, hyperglycemia, depression, dyslipidemia, history of substance abuse, and history of stroke with aphasia. Labs were last performed 02/04/17. Chemistries, electrolytes, and renal function were normal.. LDL was mildly above goal at 82. He does have a history of rhabdomyolysis. Since June 2018 he had developed a mild anemia with hemoglobin of 13 and hematocrit 40 on 02/04/17. Iron panel was normal. On 05/11/17 hemoglobin was 12.8 and hematocrit 39.1 indicating minimal progression. His white count is chronically low normal He had a colonoscopy in 2017 with resection of a tubular adenoma. FOB negative in January & iron panel normal. A1c has remained prediabetic at 6.2%. TSH, B12 level, and vitamin D level were therapeutic are normal.  Review of systems: Dementia prevented validation of review of systems. Responses typically were "that's good". He denied any specific GI, bleeding dycrasia related,cardiopulmonary or neurologic symptoms.  Constitutional: No fever, significant weight change, fatigue  Cardiovascular: No chest pain, palpitations, paroxysmal nocturnal dyspnea, claudication, edema  Respiratory: No cough, sputum production, hemoptysis, DOE , significant snoring, apnea   Gastrointestinal: No heartburn, dysphagia, abdominal pain, nausea /vomiting, rectal bleeding, melena, change in bowels Genitourinary: No dysuria,  hematuria, pyuria, incontinence, nocturia Neurologic: No dizziness, headache, syncope, seizures, numbness, tingling Hematologic/lymphatic: No significant bruising, lymphadenopathy, abnormal bleeding  Physical exam:  Pertinent or positive findings: Head is shaven, his face is unshaven. He has only 2 remaining lower teeth. He has dense arcus senilis. He has minor expiratory rhonchi. Pedal pulses in the right extremity are decreased. He has a BKA on the left. He is weaker on the left side  General appearance: Adequately nourished; no acute distress, increased work of breathing is present.   Lymphatic: No lymphadenopathy about the head, neck, axilla. Eyes: No conjunctival inflammation or lid edema is present. There is no scleral icterus. Ears:  External ear exam shows no significant lesions or deformities.   Nose:  External nasal examination shows no deformity or inflammation. Nasal mucosa are pink and moist without lesions, exudates Oral exam:  Lips and gums are healthy appearing. There is no oropharyngeal erythema or exudate. Neck:  No thyromegaly, masses, tenderness noted.    Heart:  Normal rate and regular rhythm. S1 and S2 normal without gallop, murmur, click, rub .  Lungs: without wheezes,rales , rubs. Abdomen:Bowel sounds are normal. Abdomen is soft and nontender with no organomegaly, hernias,masses. GU: deferred  Extremities:  No cyanosis, clubbing,edema  Skin: Warm & dry w/o tenting. No significant lesions or rash.  See summary under each active problem in the Problem List with associated updated therapeutic plan

## 2017-05-13 NOTE — Patient Instructions (Signed)
See assessment and plan under each diagnosis in the problem list and acutely for this visit 

## 2017-05-25 LAB — HM DIABETES EYE EXAM

## 2017-06-30 LAB — MICROALBUMIN, URINE: MICROALB UR: 1.2

## 2017-07-13 LAB — BASIC METABOLIC PANEL
BUN: 12 (ref 4–21)
Creatinine: 1 (ref 0.6–1.3)
Glucose: 88
Potassium: 4.3 (ref 3.4–5.3)
Sodium: 142 (ref 137–147)

## 2017-07-13 LAB — CBC AND DIFFERENTIAL
HEMATOCRIT: 42 (ref 41–53)
HEMOGLOBIN: 13.4 — AB (ref 13.5–17.5)
NEUTROS ABS: 2
Platelets: 300 (ref 150–399)
WBC: 3.5

## 2017-07-13 LAB — HEMOGLOBIN A1C: Hemoglobin A1C: 6.2

## 2017-09-02 ENCOUNTER — Non-Acute Institutional Stay (SKILLED_NURSING_FACILITY): Payer: Medicare Other | Admitting: Internal Medicine

## 2017-09-02 ENCOUNTER — Encounter: Payer: Self-pay | Admitting: Internal Medicine

## 2017-09-02 DIAGNOSIS — D509 Iron deficiency anemia, unspecified: Secondary | ICD-10-CM | POA: Diagnosis not present

## 2017-09-02 DIAGNOSIS — F0280 Dementia in other diseases classified elsewhere without behavioral disturbance: Secondary | ICD-10-CM | POA: Diagnosis not present

## 2017-09-02 DIAGNOSIS — I10 Essential (primary) hypertension: Secondary | ICD-10-CM

## 2017-09-02 DIAGNOSIS — R7303 Prediabetes: Secondary | ICD-10-CM | POA: Diagnosis not present

## 2017-09-02 NOTE — Assessment & Plan Note (Signed)
No significant progression of anemia

## 2017-09-02 NOTE — Assessment & Plan Note (Signed)
BP controlled; no change in antihypertensive medications  

## 2017-09-02 NOTE — Progress Notes (Signed)
    NURSING HOME LOCATION:  [facility] ROOM NUMBER:  121-B  CODE STATUS:  Full Code  PCP:  Hendricks Limes, MD  [place] [address]  This is a nursing facility follow up of chronic medical diagnoses.    Interim medical record and care since last [place] visit was updated with review of diagnostic studies and change in clinical status since last visit were documented.  HPI: He is a permanent resident of SNF with diagnoses of stroke, complicated by aphasia, peripheral vascular disease with amputation, essential hypertension, chronic diastolic heart failure, dyslipidemia, vascular dementia, and history of substance abuse. Staff reports no change in his condition. The only unusual occurrence was that he had a bowel movement in his [place] today which is atypical for him. Labs last performed 07/13/17. Renal function was normal. A1c was prediabetic at 6.2%. He had a minimal anemia with hemoglobin of 13.4.  Review of systems: Dementia invalidated responses. All responses are "pretty good or that good". He denies any symptoms. Constitutional: No fever, significant weight change, fatigue  Eyes: No redness, discharge, pain, vision change ENT/mouth: No nasal congestion,  purulent discharge, earache, change in hearing, sore throat  Cardiovascular: No chest pain, palpitations, paroxysmal nocturnal dyspnea, claudication, edema  Respiratory: No cough, sputum production, hemoptysis, DOE, significant snoring, apnea   Gastrointestinal: No heartburn, dysphagia, abdominal pain, nausea /vomiting, rectal bleeding, melena, change in bowels Genitourinary: No dysuria, hematuria, pyuria, incontinence, nocturia Musculoskeletal: No joint stiffness, joint swelling, weakness, pain Dermatologic: No rash, pruritus, change in appearance of skin Neurologic: No dizziness, headache, syncope, seizures, numbness, tingling Psychiatric: No significant anxiety, depression, insomnia,  anorexia Endocrine: No change in hair/skin/nails, excessive thirst, excessive hunger, excessive urination  Hematologic/lymphatic: No significant bruising, lymphadenopathy, abnormal bleeding Allergy/immunology: No itchy/watery eyes, significant sneezing, urticaria, angioedema  Physical exam:  Pertinent or positive findings: He has a beard and mustache. He has dense arcus senilis. He has only two lower canine teeth remaining which are plaque encrusted. He has contractures of the right upper extremity. The right upper extremity is cooler than the left. There are no definite ischemic changes in the skin noted. He is also markedly weak in the right lower extremity. He has a BKA on the left. The right pedal pulses are decreased. Skin over the right shin is dry and cracking.  General appearance: Adequately nourished; no acute distress, increased work of breathing is present.   Lymphatic: No lymphadenopathy about the head, neck, axilla. Eyes: No conjunctival inflammation or lid edema is present. There is no scleral icterus. Ears:  External ear exam shows no significant lesions or deformities.   Nose:  External nasal examination shows no deformity or inflammation. Nasal mucosa are pink and moist without lesions, exudates Oral exam:  Lips and gums are healthy appearing. There is no oropharyngeal erythema or exudate. Neck:  No thyromegaly, masses, tenderness noted.    Heart:  Normal rate and regular rhythm. S1 and S2 normal without gallop, murmur, click, rub .  Lungs: Chest clear to auscultation without wheezes, rhonchi, rales, rubs. Abdomen: Bowel sounds are normal. Abdomen is soft and nontender with no organomegaly, hernias, masses. GU: Deferred  Extremities:  No cyanosis, clubbing, edema  Neurologic exam : Balance, Rhomberg, finger to nose testing could not be completed due to clinical state Skin: Warm & dry w/o tenting. No significant rash.  See summary under each active problem in the Problem  List with associated updated therapeutic plan

## 2017-09-02 NOTE — Assessment & Plan Note (Signed)
Psych NP to continue monitor of psychotropic drugs No behavioral issues reported by staff

## 2017-09-02 NOTE — Assessment & Plan Note (Signed)
A1c remains prediabetic

## 2017-09-28 ENCOUNTER — Non-Acute Institutional Stay (SKILLED_NURSING_FACILITY): Payer: Medicare Other

## 2017-09-28 DIAGNOSIS — Z0000 Encounter for general adult medical examination without abnormal findings: Secondary | ICD-10-CM | POA: Diagnosis not present

## 2017-09-28 NOTE — Progress Notes (Signed)
Subjective:   Jeremy Howell is a 62 y.o. male who presents for Medicare Annual/Subsequent preventive examination at [facility] long term snf; incapacitated patient unable to answer questions appropriately     Objective:    Vitals: BP 134/81 (BP Location: Left Arm, Patient Position: Supine)   Pulse 60   Temp 97.6 F (36.4 C) (Oral)   Ht 5\' 7"  (1.702 m)   Wt 157 lb (71.2 kg)   BMI 24.59 kg/m   Body mass index is 24.59 kg/m.  Advanced Directives 09/28/2017 10/01/2016 08/22/2015 08/20/2015 08/16/2015 07/11/2015 06/14/2015  Does Patient Have a Medical Advance Directive? Yes Yes No No No No No  Type of Advance Directive Healthcare Power of [place]  Does patient want to make changes to medical advance directive? No - Patient declined No - Patient declined - - - - -  Copy of [place] in Chart? Yes Yes - - No - copy requested - -  Would patient like information on creating a medical advance directive? - - No - patient declined information No - patient declined information - No - patient declined information No - patient declined information  Pre-existing out of facility DNR order (yellow form or pink MOST form) - - - - - - -    Tobacco Social History   Tobacco Use  Smoking Status Former Smoker  . Years: 15.00  . Types: Cigarettes  . Last attempt to quit: 08/10/2011  . Years since quitting: 6.1  Smokeless Tobacco Never Used  Tobacco Comment   declined     Counseling given: Not Answered Comment: declined   Clinical Intake:  Pre-visit preparation completed: No  Pain : Faces Faces Pain Scale: No hurt  Faces Pain Scale: No hurt  Nutritional Risks: None Diabetes: No  How often do you need to have someone help you when you read instructions, pamphlets, or other written materials from your doctor or pharmacy?: 4 - Often  Interpreter Needed?: No  Information entered by :: Tyson Dense, RN  Past Medical History:    Diagnosis Date  . Acute renal failure ([place])   . 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 Anemia   . BPH (benign prostatic hyperplasia)   . Cardiomyopathy    EF 20-25% by echo 01/2010 with severe LVH felt secondary to substance abuse (no ischemic workup)  . CHF (congestive heart failure) ([place])   . Dependent for walking   . Diabetes mellitus    Prediabetes  . Gangrene of foot ([place])   . HTN (hypertension)   . Hyperkalemia   . Hyperlipidemia   . Noncompliance   . Peripheral vascular disease ([place])   . Pneumonia   . Polysubstance abuse ([place])    Reported hx of cocaine, THC, heroin, EtOH abuse  . Streptococcal pneumonia ([place])   . 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 Urinary retention   . Wheelchair bound    Past Surgical History:  Procedure Laterality Date  . ABDOMINAL ANGIOGRAM  02/12/2012   Procedure: ABDOMINAL ANGIOGRAM;  Surgeon: Elam Dutch, MD;  Location: [facility] CATH LAB;  Service: Cardiovascular;;  . ABDOMINAL AORTAGRAM N/A 10/16/2011   Procedure: ABDOMINAL Maxcine Ham;  Surgeon: Elam Dutch, MD;  Location: [facility] CATH LAB;  Service: Cardiovascular;  Laterality: N/A;  . ABDOMINAL AORTAGRAM N/A 10/14/2012  Procedure: ABDOMINAL AORTAGRAM;  Surgeon: Elam Dutch, MD;  Location: [facility] CATH LAB;  Service: Cardiovascular;  Laterality: N/A;  . AMPUTATION  08/15/2011   Procedure: AMPUTATION ABOVE KNEE;  Surgeon: Elam Dutch, MD;  Location: [place];  Service: Vascular;  Laterality: Left;  Amputation End:  2671  . COLONOSCOPY N/A 08/20/2015   Procedure: COLONOSCOPY;  Surgeon: Jerene Bears, MD;  Location: [facility] ENDOSCOPY;  Service: Gastroenterology;  Laterality: N/A;  . FEMORAL-TIBIAL BYPASS GRAFT  12/23/2011   Procedure: BYPASS GRAFT FEMORAL-TIBIAL ARTERY;  Surgeon: Elam Dutch, MD;  Location: [facility] OR;  Service: Vascular;  Laterality: Right;  Right Popliteal - Tibial Artery Bypass  using Reversed Saphenous vein  . JOINT REPLACEMENT     BTHA  . LOWER EXTREMITY ANGIOGRAM N/A 02/12/2012   Procedure: LOWER EXTREMITY ANGIOGRAM;  Surgeon: Elam Dutch, MD;  Location: [facility] CATH LAB;  Service: Cardiovascular;  Laterality: N/A;  . PERCUTANEOUS STENT INTERVENTION  02/12/2012   Procedure: PERCUTANEOUS STENT INTERVENTION;  Surgeon: Elam Dutch, MD;  Location: [facility] CATH LAB;  Service: Cardiovascular;;  . TOTAL HIP ARTHROPLASTY Bilateral    Family History  Problem Relation Age of Onset  . Stroke Brother   . Emphysema Brother   . Stroke Mother   . Heart disease Mother   . Diabetes Mother   . Hepatitis Mother   . Clotting disorder Mother   . Alzheimer's disease Mother   . Pneumonia Father    Social History   Socioeconomic History  . Marital status: Single    Spouse name: Not on file  . Number of children: 4  . Years of education: Not on file  . Highest education level: Not on file  Occupational History  . Occupation: disabled    Fish farm manager: UNEMPLOYED  Social Needs  . Financial resource strain: Not on file  . Food insecurity:    Worry: Not on file    Inability: Not on file  . Transportation needs:    Medical: Not on file    Non-medical: Not on file  Tobacco Use  . Smoking status: Former Smoker    Years: 15.00    Types: Cigarettes    Last attempt to quit: 08/10/2011    Years since quitting: 6.1  . Smokeless tobacco: Never Used  . Tobacco comment: declined  Substance and Sexual Activity  . Alcohol use: No    Alcohol/week: 0.0 standard drinks  . Drug use: No    Comment: + previous history  . Sexual activity: Not on file  Lifestyle  . Physical activity:    Days per week: Not on file    Minutes per session: Not on file  . Stress: Not on file  Relationships  . Social connections:    Talks on phone: Not on file    Gets together: Not on file    Attends religious service: Not on file    Active member of club or organization: Not on file    Attends meetings  of clubs or organizations: Not on file    Relationship status: Not on file  Other Topics Concern  . Not on file  Social History Narrative   Lived with sister prior to moving to [facility]     Outpatient Encounter Medications as of 09/28/2017  Medication Sig  . acetaminophen (TYLENOL) 325 MG tablet Take 650 mg by mouth every 6 (six) hours as needed for mild pain.   . bisacodyl (DULCOLAX) 10 MG suppository Place 10 mg rectally  daily as needed for moderate constipation (for constipation not relieved by milk of magnesia).   . carvedilol (COREG) 25 MG tablet Take 12.5 mg by mouth 2 (two) times daily. Hold for HR<65  . Cholecalciferol (VITAMIN D3) 50000 units TABS Take 1 tablet by mouth every 30 (thirty) days.   . citalopram (CELEXA) 20 MG tablet Take 20 mg by mouth every morning.  . docusate sodium (COLACE) 100 MG capsule Take 100 mg by mouth 2 (two) times daily.  . ferrous sulfate 325 (65 FE) MG tablet Take 325 mg by mouth daily with breakfast.   . finasteride (PROSCAR) 5 MG tablet Take 5 mg by mouth daily.  . furosemide (LASIX) 20 MG tablet Take 20 mg by mouth daily.  Mar[place] Kitchen gabapentin (NEURONTIN) 100 MG capsule Take 100 mg by mouth 2 (two) times daily.  Mar[place] Kitchen lamoTRIgine (LAMICTAL) 100 MG tablet Take 100 mg by mouth daily.  Mar[place] Kitchen lisinopril (PRINIVIL,ZESTRIL) 10 MG tablet Take 10 mg by mouth daily. Hold if SBP <100 or SBP <60  . magnesium hydroxide (MILK OF MAGNESIA) 400 MG/5ML suspension Take 30 mLs by mouth every 3 (three) days as needed for mild constipation.   Mar[place] Kitchen omeprazole (PRILOSEC) 20 MG capsule Take 20 mg by mouth daily.  . polyethylene glycol (MIRALAX / GLYCOLAX) packet Take 17 g by mouth daily.  [place] Kitchen Propylene Glycol (SYSTANE BALANCE) 0.6 % SOLN Place 2 drops into both eyes 4 (four) times daily.   . risperiDONE (RISPERDAL) 0.25 MG tablet Take 0.75 mg by mouth at bedtime. Give three 0.25 mg tablets to = 0.75 mg  . rosuvastatin (CRESTOR) 40 MG tablet Take 40 mg by mouth daily.   . tamsulosin (FLOMAX) 0.4 MG CAPS capsule Take 1 capsule (0.4 mg total) by mouth daily after breakfast.  . vitamin C (ASCORBIC ACID) 500 MG tablet Take 500 mg by mouth daily with breakfast. To be given with iron   No facility-administered encounter medications on file as of 09/28/2017.     Activities of Daily Living In your present state of health, do you have any difficulty performing the following activities: 09/28/2017 10/01/2016  Hearing? N Y  Vision? N N  Difficulty concentrating or making decisions? Tempie Donning  Walking or climbing stairs? Y Y  Dressing or bathing? Y Y  Doing errands, shopping? Tempie Donning  Preparing Food and eating ? Y Y  Using the Toilet? Y Y  In the past six months, have you accidently leaked urine? Y Y  Do you have problems with loss of bowel control? Y Y  Managing your Medications? Y Y  Managing your Finances? Tempie Donning  Housekeeping or managing your Housekeeping? Tempie Donning  Some recent data might be hidden    Patient Care Team: Hendricks Limes, MD as PCP - General (Internal Medicine)   Assessment:   This is a routine wellness examination for [place].  Exercise Activities and Dietary recommendations Current Exercise Habits: The patient does not participate in regular exercise at present, Exercise limited by: orthopedic condition(s)  Goals   None     Fall Risk Fall Risk  09/28/2017 10/01/2016  Falls in the past year? No No   Is the patient's home free of loose throw rugs in walkways, pet beds, electrical cords, etc?   yes      Grab bars in the bathroom? yes      Handrails on the stairs?   yes      Adequate lighting?   yes  Depression Screen PHQ 2/9 Scores  09/28/2017 10/01/2016  Exception Documentation Medical reason Medical reason    Cognitive Function MMSE - Mini Mental State Exam 09/28/2017 10/01/2016  Not completed: Unable to complete Unable to complete        Immunization History  Administered Date(s) Administered  . Influenza Split 10/24/2011  . Influenza,  High Dose Seasonal PF 11/07/2015  . Influenza-Unspecified 11/09/2012, 11/21/2014, 11/22/2016  . PPD Test 07/23/2011, 08/19/2011  . Pneumococcal Conjugate-13 11/08/2015  . Pneumococcal Polysaccharide-23 10/24/2011  . Pneumococcal-Unspecified 08/24/2011, 09/12/2012    Qualifies for Shingles Vaccine? Not in past records  Screening Tests Health Maintenance  Topic Date Due  . INFLUENZA VACCINE  09/09/2017  . TETANUS/TDAP  06/09/2018 (Originally 12/19/1974)  . HEMOGLOBIN A1C  01/12/2018  . OPHTHALMOLOGY EXAM  05/26/2018  . COLONOSCOPY  08/19/2025  . PNEUMOCOCCAL POLYSACCHARIDE VACCINE AGE 63-64 HIGH RISK  Completed  . Hepatitis C Screening  Completed  . HIV Screening  Completed   Cancer Screenings: Lung: Low Dose CT Chest recommended if Age 29-80 years, 30 pack-year currently smoking OR have quit w/in 15years. Patient does not qualify. Colorectal: up to date  Additional Screenings:  Hepatitis C Screening: unable to accept or decline  Tdap due: ordered    Plan:    I have personally reviewed and addressed the Medicare Annual Wellness questionnaire and have noted the following in the patient's chart:  A. Medical and social history B. Use of alcohol, tobacco or illicit drugs  C. Current medications and supplements D. Functional ability and status E.  Nutritional status F.  Physical activity G. Advance directives H. List of other physicians I.  Hospitalizations, surgeries, and ER visits in previous 12 months J.  [place] to include hearing, vision, cognitive, depression L. Referrals and appointments - none  In addition, I am unable to review and discuss with incapacitated patient certain preventive protocols, quality metrics, and best practice recommendations. A written personalized care plan for preventive services as well as general preventive health recommendations were provided to patient.   See attached scanned questionnaire for additional information.   Signed,    Tyson Dense, RN Nurse Health Advisor

## 2017-09-28 NOTE — Patient Instructions (Addendum)
[name] , Thank you for taking time to come for your Medicare Wellness Visit. I appreciate your ongoing commitment to your health goals. Please review the following plan we discussed and let me know if I can assist you in the future.   Screening recommendations/referrals: Colonoscopy up to date, due 08/19/2025 Recommended yearly ophthalmology/optometry visit for glaucoma screening and checkup Recommended yearly dental visit for hygiene and checkup  Vaccinations: Influenza vaccine due Pneumococcal vaccine due at age 63 Tdap vaccine due, ordered Shingles vaccine not in past records    Advanced directives: in chart  Conditions/risks identified: none  Next appointment: Dr. Linna Darner makes rounds  Preventive Care 40-64 Years, Male Preventive care refers to lifestyle choices and visits with your health care provider that can promote health and wellness. What does preventive care include?  A yearly physical exam. This is also called an annual well check.  Dental exams once or twice a year.  Routine eye exams. Ask your health care provider how often you should have your eyes checked.  Personal lifestyle choices, including:  Daily care of your teeth and gums.  Regular physical activity.  Eating a healthy diet.  Avoiding tobacco and drug use.  Limiting alcohol use.  Practicing safe sex.  Taking low-dose aspirin every day starting at age 93. What happens during an annual well check? The services and screenings done by your health care provider during your annual well check will depend on your age, overall health, lifestyle risk factors, and family history of disease. Counseling  Your health care provider may ask you questions about your:  Alcohol use.  Tobacco use.  Drug use.  Emotional well-being.  Home and relationship well-being.  Sexual activity.  Eating habits.  Work and work Statistician. Screening  You may have the following tests or  measurements:  Height, weight, and BMI.  Blood pressure.  Lipid and cholesterol levels. These may be checked every 5 years, or more frequently if you are over 55 years old.  Skin check.  Lung cancer screening. You may have this screening every year starting at age 51 if you have a 30-pack-year history of smoking and currently smoke or have quit within the past 15 years.  Fecal occult blood test (FOBT) of the stool. You may have this test every year starting at age 80.  Flexible sigmoidoscopy or colonoscopy. You may have a sigmoidoscopy every 5 years or a colonoscopy every 10 years starting at age 33.  Prostate cancer screening. Recommendations will vary depending on your family history and other risks.  Hepatitis C blood test.  Hepatitis B blood test.  Sexually transmitted disease (STD) testing.  Diabetes screening. This is done by checking your blood sugar (glucose) after you have not eaten for a while (fasting). You may have this done every 1-3 years. Discuss your test results, treatment options, and if necessary, the need for more tests with your health care provider. Vaccines  Your health care provider may recommend certain vaccines, such as:  Influenza vaccine. This is recommended every year.  Tetanus, diphtheria, and acellular pertussis (Tdap, Td) vaccine. You may need a Td booster every 10 years.  Zoster vaccine. You may need this after age 38.  Pneumococcal 13-valent conjugate (PCV13) vaccine. You may need this if you have certain conditions and have not been vaccinated.  Pneumococcal polysaccharide (PPSV23) vaccine. You may need one or two doses if you smoke cigarettes or if you have certain conditions. Talk to your health care provider about which screenings and  vaccines you need and how often you need them. This information is not intended to replace advice given to you by your health care provider. Make sure you discuss any questions you have with your health care  provider. Document Released: 02/22/2015 Document Revised: 10/16/2015 Document Reviewed: 11/27/2014 Elsevier Interactive Patient Education  2017 [employer] Prevention in the Home Falls can cause injuries. They can happen to people of all ages. There are many things you can do to make your home safe and to help prevent falls. What can I do on the outside of my home?  Regularly fix the edges of walkways and driveways and fix any cracks.  Remove anything that might make you trip as you walk through a door, such as a raised step or threshold.  Trim any bushes or trees on the path to your home.  Use bright outdoor lighting.  Clear any walking paths of anything that might make someone trip, such as rocks or tools.  Regularly check to see if handrails are loose or broken. Make sure that both sides of any steps have handrails.  Any raised decks and porches should have guardrails on the edges.  Have any leaves, snow, or ice cleared regularly.  Use sand or salt on walking paths during winter.  Clean up any spills in your garage right away. This includes oil or grease spills. What can I do in the bathroom?  Use night lights.  Install grab bars by the toilet and in the tub and shower. Do not use towel bars as grab bars.  Use non-skid mats or decals in the tub or shower.  If you need to sit down in the shower, use a plastic, non-slip stool.  Keep the floor dry. Clean up any water that spills on the floor as soon as it happens.  Remove soap buildup in the tub or shower regularly.  Attach bath mats securely with double-sided non-slip rug tape.  Do not have throw rugs and other things on the floor that can make you trip. What can I do in the bedroom?  Use night lights.  Make sure that you have a light by your bed that is easy to reach.  Do not use any sheets or blankets that are too big for your bed. They should not hang down onto the floor.  Have a firm chair that has  side arms. You can use this for support while you get dressed.  Do not have throw rugs and other things on the floor that can make you trip. What can I do in the kitchen?  Clean up any spills right away.  Avoid walking on wet floors.  Keep items that you use a lot in easy-to-reach places.  If you need to reach something above you, use a strong step stool that has a grab bar.  Keep electrical cords out of the way.  Do not use floor polish or wax that makes floors slippery. If you must use wax, use non-skid floor wax.  Do not have throw rugs and other things on the floor that can make you trip. What can I do with my stairs?  Do not leave any items on the stairs.  Make sure that there are handrails on both sides of the stairs and use them. Fix handrails that are broken or loose. Make sure that handrails are as long as the stairways.  Check any carpeting to make sure that it is firmly attached to the stairs. Fix any  carpet that is loose or worn.  Avoid having throw rugs at the top or bottom of the stairs. If you do have throw rugs, attach them to the floor with carpet tape.  Make sure that you have a light switch at the top of the stairs and the bottom of the stairs. If you do not have them, ask someone to add them for you. What else can I do to help prevent falls?  Wear shoes that:  Do not have high heels.  Have rubber bottoms.  Are comfortable and fit you well.  Are closed at the toe. Do not wear sandals.  If you use a stepladder:  Make sure that it is fully opened. Do not climb a closed stepladder.  Make sure that both sides of the stepladder are locked into place.  Ask someone to hold it for you, if possible.  Clearly mark and make sure that you can see:  Any grab bars or handrails.  First and last steps.  Where the edge of each step is.  Use tools that help you move around (mobility aids) if they are needed. These  include:  Canes.  Walkers.  Scooters.  Crutches.  Turn on the lights when you go into a dark area. Replace any light bulbs as soon as they burn out.  Set up your furniture so you have a clear path. Avoid moving your furniture around.  If any of your floors are uneven, fix them.  If there are any pets around you, be aware of where they are.  Review your medicines with your doctor. Some medicines can make you feel dizzy. This can increase your chance of falling. Ask your doctor what other things that you can do to help prevent falls. This information is not intended to replace advice given to you by your health care provider. Make sure you discuss any questions you have with your health care provider. Document Released: 11/22/2008 Document Revised: 07/04/2015 Document Reviewed: 03/02/2014 Elsevier Interactive Patient Education  2017 [employer].

## 2017-11-09 ENCOUNTER — Non-Acute Institutional Stay (SKILLED_NURSING_FACILITY): Payer: Medicare Other | Admitting: Internal Medicine

## 2017-11-09 ENCOUNTER — Encounter: Payer: Self-pay | Admitting: Internal Medicine

## 2017-11-09 DIAGNOSIS — I10 Essential (primary) hypertension: Secondary | ICD-10-CM | POA: Diagnosis not present

## 2017-11-09 DIAGNOSIS — E782 Mixed hyperlipidemia: Secondary | ICD-10-CM

## 2017-11-09 DIAGNOSIS — F0280 Dementia in other diseases classified elsewhere without behavioral disturbance: Secondary | ICD-10-CM

## 2017-11-09 DIAGNOSIS — R7303 Prediabetes: Secondary | ICD-10-CM

## 2017-11-09 DIAGNOSIS — D509 Iron deficiency anemia, unspecified: Secondary | ICD-10-CM

## 2017-11-09 NOTE — Assessment & Plan Note (Addendum)
Lipids due in December BMET & LFTs also indicated then

## 2017-11-09 NOTE — Assessment & Plan Note (Signed)
Glucoses are normal and A1c has remained stable and prediabetic at 6.2% since December 2018.

## 2017-11-09 NOTE — Assessment & Plan Note (Addendum)
BP controlled; no change in antihypertensive medications BMET in 12/19

## 2017-11-09 NOTE — Assessment & Plan Note (Addendum)
Staff reports no behavioral disturbances Focus should be on the reversible risk factors including hypertension, dyslipidemia, and diabetes Psych NP to monitor periodically or with acute change in behavior

## 2017-11-09 NOTE — Progress Notes (Signed)
    NURSING HOME LOCATION:  [facility] ROOM NUMBER:  [id number]  CODE STATUS:  Full Code  PCP:  Hendricks Limes, MD  [place] [address]  This is a nursing facility follow up of chronic medical diagnoses.    Interim medical record and care since last [place] visit was updated with review of diagnostic studies and change in clinical status since last visit were documented.  HPI: He is a permanent resident of the SNF with medical diagnoses of prior stroke with hemiparesis & vascular dementia, history of substance abuse, peripheral vascular disease status post amputation, chronic heart failure, and essential hypertension. He is on Lamictal but has exhibited no behavioral issues. Most recent labs were in June.  Chemistries and electrolytes were normal.  A1c has been stable at 6.2% since December 2018.  At that time lipids were essentially at goal.  He remains on iron and anemia is stable to improved.  Review of systems: Dementia invalidated responses.  All responses are "that's good".  He has great difficulty even following commands.    Physical exam:  Pertinent or positive findings: When seen it was after lunch & he was still in bed.  Staff was preparing to bathe him.  Hair is close cropped.  He has dense arcus senilis.  He has only 2 large lower canine teeth.  Heart sounds are distant.  There is a slight increase in second heart sound.  Breath sounds are decreased.  There is contracture of the right hand and right upper extremity but he can oppose with this to some extent.  There is some weakness in the left upper extremity as well.  The right lower extremity also exhibits weakness.  Below the R knee there are marked keratotic changes of the shin.  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  Pedal pulses on the right are decreased.  It was difficult to test strength as he had difficulty following commands to oppose resistance.  General appearance: Adequately nourished; no acute  distress, increased work of breathing is present.   Lymphatic: No lymphadenopathy about the head, neck, axilla. Eyes: No conjunctival inflammation or lid edema is present. There is no scleral icterus. Ears:  External ear exam shows no significant lesions or deformities.   Nose:  External nasal examination shows no deformity or inflammation. Nasal mucosa are pink and moist without lesions, exudates Oral exam:  Lips and gums are healthy appearing. Neck:  No thyromegaly, masses, tenderness noted.    Heart:  Normal rate and regular rhythm. S1 normal without gallop, murmur, click, rub .  Lungs:  without wheezes, rhonchi, rales, rubs. Abdomen: Bowel sounds are normal. Abdomen is soft and nontender with no organomegaly, hernias, masses. GU: Deferred  Extremities:  No cyanosis, clubbing, edema  Neurologic exam : Balance, Rhomberg, finger to nose testing could not be completed due to clinical state Deep tendon reflexes are equal Skin: Warm & dry w/o tenting. No significant rash.  See summary under each active problem in the Problem List with associated updated therapeutic plan  '

## 2017-11-10 ENCOUNTER — Encounter: Payer: Self-pay | Admitting: Internal Medicine

## 2017-11-10 NOTE — Patient Instructions (Signed)
See assessment and plan under each diagnosis in the problem list and acutely for this visit 

## 2017-11-10 NOTE — Assessment & Plan Note (Signed)
Stable to improved serially

## 2017-11-22 LAB — BASIC METABOLIC PANEL
BUN: 20 (ref 4–21)
Creatinine: 1.2 (ref 0.6–1.3)
Glucose: 94
Potassium: 4.4 (ref 3.4–5.3)
SODIUM: 144 (ref 137–147)

## 2017-11-25 LAB — HM DIABETES EYE EXAM

## 2018-02-22 LAB — LIPID PANEL
Cholesterol: 157 (ref 0–200)
HDL: 47 (ref 35–70)
LDL Cholesterol: 91
LDl/HDL Ratio: 3.4
Triglycerides: 97 (ref 40–160)

## 2018-02-22 LAB — CBC AND DIFFERENTIAL
HCT: 40 — AB (ref 41–53)
HEMOGLOBIN: 13.4 — AB (ref 13.5–17.5)
Neutrophils Absolute: 2
Platelets: 292 (ref 150–399)
WBC: 3.4

## 2018-02-22 LAB — HEPATIC FUNCTION PANEL
ALT: 31 (ref 10–40)
AST: 19 (ref 14–40)
Alkaline Phosphatase: 74 (ref 25–125)
Bilirubin, Total: 0.3

## 2018-02-22 LAB — BASIC METABOLIC PANEL
BUN: 19 (ref 4–21)
Creatinine: 1.2 (ref <=1.3)
GLUCOSE: 83
Potassium: 4.8 (ref 3.4–5.3)
Sodium: 141 (ref 137–147)

## 2018-02-22 LAB — TSH: TSH: 1.91 (ref <=5.90)

## 2018-02-22 LAB — HEMOGLOBIN A1C: Hemoglobin A1C: 6.2

## 2018-03-08 ENCOUNTER — Encounter: Payer: Self-pay | Admitting: Internal Medicine

## 2018-03-08 ENCOUNTER — Non-Acute Institutional Stay (SKILLED_NURSING_FACILITY): Payer: Medicare Other | Admitting: Internal Medicine

## 2018-03-08 DIAGNOSIS — I10 Essential (primary) hypertension: Secondary | ICD-10-CM | POA: Diagnosis not present

## 2018-03-08 DIAGNOSIS — D509 Iron deficiency anemia, unspecified: Secondary | ICD-10-CM | POA: Diagnosis not present

## 2018-03-08 DIAGNOSIS — R7303 Prediabetes: Secondary | ICD-10-CM | POA: Diagnosis not present

## 2018-03-08 DIAGNOSIS — F0280 Dementia in other diseases classified elsewhere without behavioral disturbance: Secondary | ICD-10-CM | POA: Diagnosis not present

## 2018-03-08 NOTE — Patient Instructions (Signed)
See assessment and plan under each diagnosis in the problem list and acutely for this visit 

## 2018-03-08 NOTE — Progress Notes (Signed)
    NURSING HOME LOCATION:  [facility] ROOM NUMBER:  121-B  CODE STATUS:  Full Code  PCP:  Hendricks Limes, MD  [place] [address]  This is a nursing facility follow up of chronic medical diagnoses.    Interim medical record and care since last [place] visit was updated with review of diagnostic studies and change in clinical status since last visit were documented.  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 Labs were performed 02/22/2018.  Chemistries were normal.  LDL was 91 and HDL 47.  He had a minimal anemia which was essentially stable with hemoglobin 13.4 hematocrit of 40.  A1c was prediabetic at 6.2%.  TSH was therapeutic.  Review of systems: Dementia invalidated responses.  He was unable to give me the date.  When the same question was asked again the answer would vary.  Normally he would say simply "good,good". He indicated he had constipation and diarrhea. He also on one occasion answered "yes ma'am"   Physical exam:  Pertinent or positive findings: He has dense arcus senilis.  He has only 2 remaining mandibular teeth, 2 canines.  Pulses are decreased in the right lower extremity.  He has dry skin with some exfoliation over the left shin.  Left AKA is present.  He has good strength in the upper extremities, the right lower extremity is weaker.  He has contractures of the right hand.  General appearance: Adequately nourished; no acute distress, increased work of breathing is present.   Lymphatic: No lymphadenopathy about the head, neck, axilla. Eyes: No conjunctival inflammation or lid edema is present. There is no scleral icterus. Ears:  External ear exam shows no significant lesions or deformities.   Nose:  External nasal examination  shows no deformity or inflammation. Nasal mucosa are pink and moist without lesions, exudates Oral exam:  Lips and gums are healthy appearing. There is no oropharyngeal erythema or exudate. Neck:  No thyromegaly, masses, tenderness noted.    Heart:  Normal rate and regular rhythm. S1 and S2 normal without gallop, murmur, click, rub .  Lungs: Chest clear to auscultation without wheezes, rhonchi, rales, rubs. Abdomen: Bowel sounds are normal. Abdomen is soft and nontender with no organomegaly, hernias, masses. GU: Deferred  Extremities:  No cyanosis, clubbing, edema  Neurologic exam : Balance, Rhomberg, finger to nose testing could not be completed due to clinical state Skin: Warm & dry w/o tenting. No significant lesions or rash.  See summary under each active problem in the Problem List with associated updated therapeutic plan

## 2018-03-08 NOTE — Assessment & Plan Note (Signed)
Dementia is probably multifactorial, mostly due to vascular dementia No behavioral issues present

## 2018-03-08 NOTE — Assessment & Plan Note (Signed)
Anemia is stable.

## 2018-03-08 NOTE — Assessment & Plan Note (Signed)
A1c 6.2%, prediabetic

## 2018-03-08 NOTE — Assessment & Plan Note (Signed)
BP controlled; no change in antihypertensive medications  

## 2018-04-12 ENCOUNTER — Other Ambulatory Visit: Payer: Self-pay

## 2018-04-12 NOTE — Patient Outreach (Signed)
[facility]) Care Management  04/12/2018  Jeremy Howell 1955/02/22 [id number]   Medication Adherence call to Jeremy Howell patient is at a long term facility at this time patient is due on Rosuvastatin 40 mg and Lisinopril 10 mg. Jeremy Howell is showing past due under [employer].   [place] Management Direct Dial [phone]  Fax [phone] [email or web address]

## 2018-05-19 ENCOUNTER — Encounter: Payer: Self-pay | Admitting: Internal Medicine

## 2018-05-19 ENCOUNTER — Non-Acute Institutional Stay (SKILLED_NURSING_FACILITY): Payer: Medicare Other | Admitting: Internal Medicine

## 2018-05-19 DIAGNOSIS — N183 Chronic kidney disease, stage 3 unspecified: Secondary | ICD-10-CM

## 2018-05-19 DIAGNOSIS — E782 Mixed hyperlipidemia: Secondary | ICD-10-CM

## 2018-05-19 DIAGNOSIS — E1122 Type 2 diabetes mellitus with diabetic chronic kidney disease: Secondary | ICD-10-CM | POA: Diagnosis not present

## 2018-05-19 DIAGNOSIS — I5042 Chronic combined systolic (congestive) and diastolic (congestive) heart failure: Secondary | ICD-10-CM | POA: Diagnosis not present

## 2018-05-19 DIAGNOSIS — D509 Iron deficiency anemia, unspecified: Secondary | ICD-10-CM

## 2018-05-19 DIAGNOSIS — I739 Peripheral vascular disease, unspecified: Secondary | ICD-10-CM

## 2018-05-19 NOTE — Assessment & Plan Note (Addendum)
Anemia stable with hemoglobin 13.4

## 2018-05-19 NOTE — Patient Instructions (Signed)
See assessment and plan under each diagnosis in the problem list and acutely for this visit 

## 2018-05-19 NOTE — Assessment & Plan Note (Signed)
Creatinine is current and normal at 1.2

## 2018-05-19 NOTE — Assessment & Plan Note (Signed)
LDL minimally elevated at 91. Liver function tests normal.  With history of rhabdomyolysis, statin dose will not be increased.

## 2018-05-19 NOTE — Progress Notes (Signed)
   NURSING HOME LOCATION:  [facility] ROOM NUMBER:  121  CODE STATUS: Full code  PCP: Unice Cobble, MD  This is a nursing facility follow up of chronic medical diagnoses  Interim medical record and care since last [place] visit was updated with review of diagnostic studies and change in clinical status since last visit were documented.  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 Labs were performed in January of this year and revealed normal kidney function.  A1c was prediabetic at 6.2%.  Anemia was stable with hemoglobin of 13.4.  LDL was mildly elevated with a value of 91.  Review of systems: Dementia invalidated responses.  All responses are monosyllabic "no".  Constitutional: No fever, significant weight change, fatigue  Cardiovascular: No chest pain, palpitations, paroxysmal nocturnal dyspnea, claudication, edema  Respiratory: No cough, sputum production, hemoptysis, DOE, significant snoring, apnea   Gastrointestinal: No heartburn, dysphagia, abdominal pain, nausea /vomiting, rectal bleeding, melena, change in bowels Genitourinary: No dysuria, hematuria, pyuria, incontinence, nocturia Musculoskeletal: No joint stiffness, joint swelling Dermatologic: No rash, pruritus, change in appearance of skin Neurologic: No dizziness, headache, syncope, seizures Psychiatric: No significant anxiety, depression, insomnia, anorexia  Endocrine: No change in hair/skin/nails, excessive thirst, excessive hunger, excessive urination  Hematologic/lymphatic: No significant bruising, lymphadenopathy, abnormal bleeding  Physical exam:  Pertinent or positive findings: Head is shaven.  Slight ptosis is present on the right.  Arcus senilis is present.  Higinio Plan is close  shaven.  Has only 2 lower teeth.  Left AKA is present.  Pedal pulses are decreased in the right lower extremity.  Left upper extremity stronger than the right.  There is flexion of the right upper extremity at the elbow with contractures of the right hand.  General appearance: Adequately nourished; no acute distress, increased work of breathing is present.   Lymphatic: No lymphadenopathy about the head, neck, axilla. Eyes: No conjunctival inflammation or lid edema is present. There is no scleral icterus. Ears:  External ear exam shows no significant lesions or deformities.   Nose:  External nasal examination shows no deformity or inflammation. Nasal mucosa are pink and moist without lesions, exudates Oral exam:  Lips are healthy appearing. There is no oropharyngeal erythema or exudate. Neck:  No thyromegaly, masses, tenderness noted.    Heart:  Normal rate and regular rhythm. S1 and S2 normal without gallop, murmur, click, rub .  Lungs: Chest clear to auscultation without wheezes, rhonchi, rales, rubs. Abdomen: Bowel sounds are normal. Abdomen is soft and nontender with no organomegaly, hernias, masses. GU: Deferred  Extremities:  No cyanosis, clubbing, edema  Neurologic exam : Balance, Rhomberg, finger to nose testing could not be completed due to clinical state Skin: Warm & dry w/o tenting. No significant lesions or rash.  See summary under each active problem in the Problem List with associated updated therapeutic plan

## 2018-05-21 ENCOUNTER — Encounter: Payer: Self-pay | Admitting: Internal Medicine

## 2018-06-05 ENCOUNTER — Encounter: Payer: Self-pay | Admitting: Internal Medicine

## 2018-08-25 ENCOUNTER — Encounter: Payer: Self-pay | Admitting: Internal Medicine

## 2018-08-25 ENCOUNTER — Non-Acute Institutional Stay (SKILLED_NURSING_FACILITY): Payer: Medicare Other | Admitting: Internal Medicine

## 2018-08-25 DIAGNOSIS — F0280 Dementia in other diseases classified elsewhere without behavioral disturbance: Secondary | ICD-10-CM

## 2018-08-25 DIAGNOSIS — E1122 Type 2 diabetes mellitus with diabetic chronic kidney disease: Secondary | ICD-10-CM

## 2018-08-25 DIAGNOSIS — D509 Iron deficiency anemia, unspecified: Secondary | ICD-10-CM

## 2018-08-25 DIAGNOSIS — N183 Chronic kidney disease, stage 3 unspecified: Secondary | ICD-10-CM

## 2018-08-25 DIAGNOSIS — I10 Essential (primary) hypertension: Secondary | ICD-10-CM | POA: Diagnosis not present

## 2018-08-25 NOTE — Assessment & Plan Note (Signed)
Last A1c was in January of this year and was prediabetic at 6.2%.  A1c will be updated once coronavirus quarantine is lifted.

## 2018-08-25 NOTE — Assessment & Plan Note (Signed)
Psych NP will continue monitor; this has been impacted by the coronavirus quarantine.  Staff reports no behavioral issues on his present regimen of Lamictal, SSRI, and risperidone.

## 2018-08-25 NOTE — Assessment & Plan Note (Addendum)
BP controlled; recording of 93/48 was an outlier.  No change in antihypertensive medications

## 2018-08-25 NOTE — Patient Instructions (Signed)
See assessment and plan under each diagnosis in the problem list and acutely for this visit 

## 2018-08-25 NOTE — Progress Notes (Signed)
NURSING HOME LOCATION:  [facility] ROOM NUMBER:  121-B  CODE STATUS:  Full Code  PCP:  Hendricks Limes, MD  [place] [address]   This is a nursing facility follow up of chronic medical diagnoses.  Interim medical record and care since last [place] visit was updated with review of diagnostic studies and change in clinical status since last visit were documented.  HPI: He is a permanent resident of the facility with diagnoses of stroke, past history of polysubstance abuse, peripheral vascular disease, dyslipidemia, essential hypertension, diabetes complicated by vascular disease and neuropathy, cardiomyopathy, symptomatic BPH, and history of renal failure. He has had multiple vascular interventions including fem-tib bypass grafting and percutaneous stent intervention.  Additionally he has had an AKA on the left.  Extensive family history reviewed.  There is a strong family history of stroke.  He no longer drinks; he is a past smoker.  Blood pressure has been well controlled; there was 1 value of 93/48 which was an outlier.  Labs were last performed in January of this year.  He had a minimal anemia with hemoglobin 13.4/hematocrit 40. A1c was prediabetic at that time at 6.2%.  He is on high-dose vitamin D supplementation at 50,000 international units monthly.  The last vitamin D level on record was 02/04/2017 with a value of 30.85.   Review of systems: The repetitive response to questions was "that good".  He could not follow simple commands.  He seemed to deny any active symptoms; but dementia invalidates the veracity of his responses.  He is on an SSRI as well as risperidone and Lamictal.  Psych NP monitor has been impacted by the coronavirus quarantine; but no behavioral issues have been reported.  Constitutional: No fever, significant weight change, fatigue  Cardiovascular: No chest pain, palpitations, paroxysmal nocturnal dyspnea, claudication,  edema  Respiratory: No cough, sputum production, hemoptysis, DOE, significant snoring, apnea   Gastrointestinal: No heartburn, dysphagia, abdominal pain, nausea /vomiting, rectal bleeding, melena, change in bowels Genitourinary: No dysuria, hematuria, pyuria, incontinence, nocturia Musculoskeletal: No joint stiffness, joint swelling, weakness, pain Dermatologic: No rash, pruritus, change in appearance of skin Neurologic: No dizziness, headache, syncope, seizures, numbness, tingling Psychiatric: No significant anxiety, depression, insomnia, anorexia Endocrine: No change in hair/skin/nails, excessive thirst, excessive hunger, excessive urination  Hematologic/lymphatic: No significant bruising, lymphadenopathy, abnormal bleeding   Physical exam:  Pertinent or positive findings: Head is shaven.  He has dense arcus senilis.  He is edentulous except for 2 lower canine teeth.  Heart rhythm appears to be regular but rate is slow.  He has minor bibasilar rales.  Abdomen is slightly protuberant.  Pedal pulses in the right foot are decreased.  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    General appearance: Adequately nourished; no acute distress, increased work of breathing is present.   Lymphatic: No lymphadenopathy about the head, neck, axilla. Eyes: No conjunctival inflammation or lid edema is present. There is no scleral icterus. Ears:  External ear exam shows no significant lesions or deformities.   Nose:  External nasal examination shows no deformity or inflammation. Nasal mucosa are pink and moist without lesions, exudates Oral exam:  Lips  are healthy appearing. There is no oropharyngeal erythema or exudate. Neck:  No thyromegaly, masses, tenderness noted.    Heart:  No gallop, murmur, click, rub .  Lungs:  without wheezes, rhonchi, rubs. Abdomen: Bowel sounds are normal. Abdomen is soft and nontender with no organomegaly, hernias, masses. GU: Deferred  Extremities:  No cyanosis, clubbing, edema   Neurologic exam : Balance, Rhomberg, finger to nose testing could not be completed due to clinical state Skin: Warm & dry w/o tenting. No significant lesions or rash.  See summary under each active problem in the Problem List with associated updated therapeutic plan

## 2018-08-25 NOTE — Assessment & Plan Note (Addendum)
He remains on iron supplementation with supplemental vitamin C simultaneously to increase absorption. In January of this year minimal anemia was present with hemoglobin 13.4 and hematocrit of 40.  CBC will be updated once coronavirus quarantine is lifted.

## 2018-11-15 ENCOUNTER — Encounter: Payer: Self-pay | Admitting: Internal Medicine

## 2018-11-15 ENCOUNTER — Non-Acute Institutional Stay (SKILLED_NURSING_FACILITY): Payer: Medicare Other | Admitting: Internal Medicine

## 2018-11-15 DIAGNOSIS — R7303 Prediabetes: Secondary | ICD-10-CM | POA: Diagnosis not present

## 2018-11-15 DIAGNOSIS — I10 Essential (primary) hypertension: Secondary | ICD-10-CM | POA: Diagnosis not present

## 2018-11-15 DIAGNOSIS — F0280 Dementia in other diseases classified elsewhere without behavioral disturbance: Secondary | ICD-10-CM | POA: Diagnosis not present

## 2018-11-15 NOTE — Assessment & Plan Note (Signed)
BP controlled; no change in antihypertensive medications  

## 2018-11-15 NOTE — Progress Notes (Signed)
   NURSING HOME LOCATION:  [facility] ROOM NUMBER:  121-B  CODE STATUS:  Full Code  PCP:  Hendricks Limes, MD  [place] [address]  This is a nursing facility follow up of chronic medical diagnoses.  Interim medical record and care since last [place] visit was updated with review of diagnostic studies and change in clinical status since last visit were documented.  HPI: He is a permanent resident of facility with medical diagnoses of history of CVA, history of polysubstance abuse, PVD, dyslipidemia, essential hypertension, prediabetes, cardiomyopathy, BPH, history of colon polyp and post stroke associated dysarthria/aphasia. Surgeries include left AKA and multiple vascular procedures to addresse complications of PVD.  Labs are not current because of the COVID-19 restrictions.  Review of systems: Dementia invalidated responses.  His responses tend to be monosyllabic.  Most of the questions are answered "good".  At times he will contradict himself stating "yeah" initially but then quickly saying "no".  He even denied having weakness or numbness and tingling.  Physical exam:  Pertinent or positive findings: He has alopecia totalis.  Dense arcus senilis is present.  He is edentulous except for 2 large mandibular canines.  Pedal pulses are decreased in the RLE.  AKA present on the left.  He has contractures of the left upper extremity at the elbow and of the hand.  Strength to opposition is fair.  General appearance:  no acute distress, increased work of breathing is present.   Lymphatic: No lymphadenopathy about the head, neck, axilla. Eyes: No conjunctival inflammation or lid edema is present. There is no scleral icterus. Ears:  External ear exam shows no significant lesions or deformities.   Nose:  External nasal examination shows no deformity or inflammation. Nasal mucosa are pink and moist without lesions, exudates Oral exam:  Lips and gums are healthy  appearing. There is no oropharyngeal erythema or exudate. Neck:  No thyromegaly, masses, tenderness noted.    Heart:  Normal rate and regular rhythm. S1 and S2 normal without gallop, murmur, click, rub .  Lungs: without wheezes, rhonchi, rales, rubs. Abdomen: Bowel sounds are normal. Abdomen is soft and nontender with no organomegaly, hernias, masses. GU: Deferred  Extremities:  No cyanosis, clubbing, edema  Neurologic exam : Balance, Rhomberg, finger to nose testing could not be completed due to clinical state Skin: Warm & dry w/o tenting. No significant lesions or rash.  See summary under each active problem in the Problem List with associated updated therapeutic plan

## 2018-11-15 NOTE — Patient Instructions (Signed)
See assessment and plan under each diagnosis in the problem list and acutely for this visit 

## 2018-11-15 NOTE — Assessment & Plan Note (Signed)
No behavioral issues reported 

## 2018-11-16 NOTE — Assessment & Plan Note (Signed)
Update A1c post COVID 19 quarantine lifting

## 2018-12-21 LAB — COMPREHENSIVE METABOLIC PANEL
Albumin: 4 (ref 3.5–5.0)
Calcium: 9.5 (ref 8.7–10.7)
GFR calc Af Amer: 64.43
GFR calc non Af Amer: 55.59
Globulin: 2.8

## 2018-12-21 LAB — BASIC METABOLIC PANEL
BUN: 16 (ref 4–21)
CO2: 28 — AB (ref 13–22)
Chloride: 108 (ref 99–108)
Creatinine: 1.3 (ref 0.6–1.3)
Glucose: 86
Potassium: 4.8 (ref 3.4–5.3)
Sodium: 145 (ref 137–147)

## 2018-12-21 LAB — CBC: RBC: 4.51 (ref 3.87–5.11)

## 2018-12-21 LAB — CBC AND DIFFERENTIAL
HCT: 38 — AB (ref 41–53)
Hemoglobin: 12 — AB (ref 13.5–17.5)
Neutrophils Absolute: 2
Platelets: 306 (ref 150–399)
WBC: 3.6

## 2018-12-21 LAB — HEPATIC FUNCTION PANEL
ALT: 21 (ref 10–40)
AST: 17 (ref 14–40)
Alkaline Phosphatase: 74 (ref 25–125)
Bilirubin, Total: 0.2

## 2018-12-21 LAB — HEMOGLOBIN A1C: Hemoglobin A1C: 6.2

## 2019-02-16 ENCOUNTER — Encounter: Payer: Self-pay | Admitting: Internal Medicine

## 2019-02-16 ENCOUNTER — Non-Acute Institutional Stay (SKILLED_NURSING_FACILITY): Payer: Medicare Other | Admitting: Internal Medicine

## 2019-02-16 DIAGNOSIS — I10 Essential (primary) hypertension: Secondary | ICD-10-CM

## 2019-02-16 DIAGNOSIS — D509 Iron deficiency anemia, unspecified: Secondary | ICD-10-CM | POA: Diagnosis not present

## 2019-02-16 DIAGNOSIS — E782 Mixed hyperlipidemia: Secondary | ICD-10-CM

## 2019-02-16 DIAGNOSIS — D126 Benign neoplasm of colon, unspecified: Secondary | ICD-10-CM

## 2019-02-16 DIAGNOSIS — E1122 Type 2 diabetes mellitus with diabetic chronic kidney disease: Secondary | ICD-10-CM

## 2019-02-16 DIAGNOSIS — N183 Chronic kidney disease, stage 3 unspecified: Secondary | ICD-10-CM

## 2019-02-16 NOTE — Assessment & Plan Note (Addendum)
12/21/2018 A1c is now prediabetic with a value of 6.2% without diabetic medications. Creatinine is stable at 1.3; GFR is increased to 64.43 indicating stage II kidney impairment.

## 2019-02-16 NOTE — Patient Instructions (Signed)
See assessment and plan under each diagnosis in the problem list and acutely for this visit 

## 2019-02-16 NOTE — Assessment & Plan Note (Addendum)
Continue high-dose Crestor because a history of stroke. Fasting lipids due this month. Discuss with [facility] NP.

## 2019-02-16 NOTE — Assessment & Plan Note (Addendum)
Discuss fecal occult blood monitor and repeat CBC with [facility] NP.  He is on low-dose aspirin which should not significantly impact FOB testing.

## 2019-02-16 NOTE — Progress Notes (Signed)
   NURSING HOME LOCATION:  [facility] ROOM NUMBER:  121-B  CODE STATUS:  FULL CODE   PCP:  Hendricks Limes, MD  [address] [place] [address]  This is a nursing facility follow up of chronic medical diagnoses.   Interim medical record and care since last [place] visit was updated with review of diagnostic studies and change in clinical status since last visit were documented.  HPI: He is a permanent resident of the facility with medical diagnoses of peripheral vascular disease, essential hypertension, chronic combined systolic/diastolic congestive heart failure, diabetes complicated by renal disease, vascular dementia, dyslipidemia, history of substance abuse, and history of stroke.  Low-dose aspirin has recently been ordered as stroke prophylaxis.  He is on carvedilol for essential hypertension as well as a combined congestive heart failure.  He is on ACE inhibitor for nephro protection and on high-dose statin for dyslipidemia.  He is on no diabetic medications; on 12/21/2018 A1c was prediabetic at 6.2%.  On that same day creatinine was 1.3 and GFR 64.43 indicating that he is no longer stage III CKD.  Review of systems: He can provide no history.  His answers to all queries are "that's good, that's good".  Physical exam:  Pertinent or positive findings: Pattern alopecia is present.  He has a closely cropped beard.  Arcus senilis is present.  He has only 2 remaining mandibular teeth,very large canines.  Breath sounds are decreased.  Pedal pulses in the right foot are decreased.  Clubbing of the nailbeds is present. L AKA present.  Flexion deformities are noted in the right hand.  He is surprisingly strong to opposition.  General appearance: Adequately nourished; no acute distress, increased work of breathing is present.   Lymphatic: No lymphadenopathy about the head, neck, axilla. Eyes: No conjunctival inflammation or lid edema is present. There is no scleral  icterus. Ears:  External ear exam shows no significant lesions or deformities.   Nose:  External nasal examination shows no deformity or inflammation. Nasal mucosa are pink and moist without lesions, exudates Oral exam: There is no oropharyngeal erythema or exudate. Neck:  No thyromegaly, masses, tenderness noted.    Heart:  Normal rate and regular rhythm. S1 and S2 normal without gallop, murmur, click, rub .  Lungs: without wheezes, rhonchi, rales, rubs. Abdomen: Bowel sounds are normal. Abdomen is soft and nontender with no organomegaly, hernias, masses. GU: Deferred  Extremities:  No cyanosis, edema  Neurologic exam : Balance, Rhomberg, finger to nose testing could not be completed due to clinical state Skin: Warm & dry w/o tenting. No significant lesions or rash.  See summary under each active problem in the Problem List with associated updated therapeutic plan

## 2019-02-16 NOTE — Assessment & Plan Note (Signed)
12/21/2018 hemoglobin 12, down from 13.4

## 2019-02-16 NOTE — Assessment & Plan Note (Signed)
BP controlled; no change in antihypertensive medications  

## 2019-02-22 ENCOUNTER — Encounter: Payer: Self-pay | Admitting: Internal Medicine

## 2019-06-06 ENCOUNTER — Encounter: Payer: Self-pay | Admitting: Internal Medicine

## 2019-06-06 ENCOUNTER — Non-Acute Institutional Stay (SKILLED_NURSING_FACILITY): Payer: Medicare Other | Admitting: Internal Medicine

## 2019-06-06 DIAGNOSIS — I10 Essential (primary) hypertension: Secondary | ICD-10-CM

## 2019-06-06 DIAGNOSIS — I5042 Chronic combined systolic (congestive) and diastolic (congestive) heart failure: Secondary | ICD-10-CM

## 2019-06-06 DIAGNOSIS — I739 Peripheral vascular disease, unspecified: Secondary | ICD-10-CM | POA: Diagnosis not present

## 2019-06-06 DIAGNOSIS — E782 Mixed hyperlipidemia: Secondary | ICD-10-CM | POA: Diagnosis not present

## 2019-06-06 DIAGNOSIS — N183 Chronic kidney disease, stage 3 unspecified: Secondary | ICD-10-CM

## 2019-06-06 DIAGNOSIS — D126 Benign neoplasm of colon, unspecified: Secondary | ICD-10-CM

## 2019-06-06 DIAGNOSIS — Z89612 Acquired absence of left leg above knee: Secondary | ICD-10-CM

## 2019-06-06 DIAGNOSIS — E1122 Type 2 diabetes mellitus with diabetic chronic kidney disease: Secondary | ICD-10-CM

## 2019-06-06 NOTE — Assessment & Plan Note (Signed)
FOB negative 3/1,3/4 & 04/20/2019 H/H stable to improved.

## 2019-06-06 NOTE — Assessment & Plan Note (Signed)
Today's blood pressure is an outlier; blood pressure control has been good.  No change indicated but continue to monitor.

## 2019-06-06 NOTE — Assessment & Plan Note (Addendum)
02/22/2019 A1c prediabetic at 5.8%. GFR now greater than 90.  No change indicated.

## 2019-06-06 NOTE — Assessment & Plan Note (Signed)
Clinically well compensated

## 2019-06-06 NOTE — Assessment & Plan Note (Addendum)
Chronic and stable with no new lesions as per Wound Care Nurse.

## 2019-06-06 NOTE — Assessment & Plan Note (Addendum)
LDL 98 on high-dose rosuvastatin. Possibility of adding ezetimibe discussed with [facility] NP.

## 2019-06-06 NOTE — Progress Notes (Signed)
NURSING HOME LOCATION:  [facility] ROOM NUMBER: 121/B   CODE STATUS: Full Code   PCP:  Hendricks Limes MD.   This is a nursing facility follow up of chronic medical diagnoses.  Interim medical record and care since last [place] visit was updated with review of diagnostic studies and change in clinical status since last visit were documented.  HPI: He is a permanent resident of the facility with diagnoses of history of stroke with residual aphasia, history of polysubstance abuse, PVD, dyslipidemia, essential hypertension, prediabetes, history of congestive heart failure in the context of cardiomyopathy, BPH, and history of AKI. He has had multiple vascular procedures including fem-tib bypass grafting.  He is also had bilateral THA. He has had a left AKA for gangrene in the context of PVD.  Labs were completed 02/22/2019 by [facility] NP.  GFR was over 90.  Chem-20 was completely normal.  He had slight leukopenia with a white count of 3800.  Hemoglobin/hematocrit were stable to slightly improved at 12.7/38.9. LDL was 98 and HDL 44 on high-dose rosuvastatin. A1c was 5.8%, down from 6.2% in November 2020.  Review of systems: Dementia invalidated responses.  He denies any active symptoms.  All responses are "good, good".  Constitutional: No fever, significant weight change, fatigue  Eyes: No redness, discharge, pain, vision change ENT/mouth: No nasal congestion,  purulent discharge, earache, change in hearing, sore throat  Cardiovascular: No chest pain, palpitations, paroxysmal nocturnal dyspnea, claudication, edema  Respiratory: No cough, sputum production, hemoptysis, DOE, significant snoring, apnea   Gastrointestinal: No heartburn, dysphagia, abdominal pain, nausea /vomiting, rectal bleeding, melena, change in bowels Genitourinary: No dysuria, hematuria, pyuria, incontinence, nocturia Musculoskeletal: No joint stiffness, joint swelling, weakness, pain Dermatologic: No  rash, pruritus, change in appearance of skin Neurologic: No dizziness, headache, syncope, seizures, numbness, tingling Psychiatric: No significant anxiety, depression, insomnia, anorexia Endocrine: No change in hair/skin/nails, excessive thirst, excessive hunger, excessive urination  Hematologic/lymphatic: No significant bruising, lymphadenopathy, abnormal bleeding Allergy/immunology: No itchy/watery eyes, significant sneezing, urticaria, angioedema  Physical exam:  Pertinent or positive findings: Hair is gross cropped as is his mustache and beard.  He has dense arcus senilis.  He is edentulous except for 2 extremely large lower canines which are discolored.  Abdomen is protuberant.  RLE pedal pulses are decreased.  Toenails on the right foot are deformed.  The right upper extremity is flexed at the elbow and wrist with contractures of the fingers.  AKA present on the left.  Weakness of right lower extremity.  General appearance: Adequately nourished; no acute distress, increased work of breathing is present.   Lymphatic: No lymphadenopathy about the head, neck, axilla. Eyes: No conjunctival inflammation or lid edema is present. There is no scleral icterus. Ears:  External ear exam shows no significant lesions or deformities.   Nose:  External nasal examination shows no deformity or inflammation. Nasal mucosa are pink and moist without lesions, exudates Oral exam: There is no oropharyngeal erythema or exudate. Neck:  No thyromegaly, masses, tenderness noted.    Heart:  Normal rate and regular rhythm. S1 and S2 normal without gallop, murmur, click, rub .  Lungs: Chest clear to auscultation without wheezes, rhonchi, rales, rubs. Abdomen: Bowel sounds are normal. Abdomen is soft and nontender with no organomegaly, hernias, masses. GU: Deferred  Extremities:  No cyanosis, clubbing, edema  Neurologic exam : Balance, Rhomberg, finger to nose testing could not be completed due to clinical state  Skin: Warm & dry w/o tenting. No significant  lesions or rash.  See summary under each active problem in the Problem List with associated updated therapeutic plan

## 2019-06-06 NOTE — Patient Instructions (Signed)
See assessment and plan under each diagnosis in the problem list and acutely for this visit 

## 2019-06-06 NOTE — Assessment & Plan Note (Addendum)
Wound Care Nurse reports no associated lesions.

## 2019-09-07 ENCOUNTER — Non-Acute Institutional Stay (SKILLED_NURSING_FACILITY): Payer: Medicare Other | Admitting: Internal Medicine

## 2019-09-07 ENCOUNTER — Encounter: Payer: Self-pay | Admitting: Internal Medicine

## 2019-09-07 DIAGNOSIS — M79605 Pain in left leg: Secondary | ICD-10-CM

## 2019-09-07 DIAGNOSIS — I10 Essential (primary) hypertension: Secondary | ICD-10-CM

## 2019-09-07 NOTE — Progress Notes (Signed)
NURSING HOME LOCATION:  [facility] ROOM NUMBER:  [id number]  CODE STATUS:  FULL CODE  PCP:  Hendricks Limes, MD  [address] [place] [address]  This is a nursing facility follow up of chronic medical diagnoses.    Interim medical record and care since last [place] visit was updated with review of diagnostic studies and change in clinical status since last visit were documented.  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  Wound Care Nurse evaluated him and found no evidence of cellulitis or abscess. [facility] NP did initiate Cymbalta.  PT/OT have been using ultrasound therapy on the extremity. Blood pressure has been variable from a low of 119/63 up  to a high of 168/92.   Labs are not current but were normal in November 2020.  Review of systems: His verbal communication is dramatically limited.  Responses are monosyllabic, typically "no".  He does validate tenderness to touch at the operative scar line at the left AKA op site.He does seem to tenderness at the scar. He seems to indicate he only has discomfort when is touched.  Constitutional: No fever, significant weight change, fatigue  Cardiovascular: No chest pain, palpitations, paroxysmal nocturnal dyspnea, edema  Respiratory: No cough, sputum production, hemoptysis Gastrointestinal: No heartburn, dysphagia, abdominal pain, nausea /vomiting, rectal bleeding, melena, change in bowels Genitourinary: No dysuria, hematuria, pyuria, nocturia Musculoskeletal: No joint stiffness, joint swelling Dermatologic: No rash, pruritus, change in appearance of skin Neurologic: No dizziness, headache, syncope, seizures Psychiatric: No significant anxiety, depression, insomnia, anorexia Endocrine: No change in hair/skin/nails, excessive thirst, excessive hunger, excessive urination  Hematologic/lymphatic: No significant bruising,  lymphadenopathy, abnormal bleeding  Physical exam:  Pertinent or positive findings: Pattern alopecia is present.  He appears adequately nourished.  Dense arcus senilis is present.  Left nasolabial fold is slightly decreased.  He is edentulous except for 2 lower large canines.  Pedal pulses are decreased in the right lower extremity.  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  The op site is clean without erythema or edema.  He does appear to be tender to touch over the op scar line.  He has contracture of the right upper extremity.  The right lower extremity is weak to opposition.  He had difficulty following commands such as request to raise 2 fingers but he could perform this when I asked him to mimic my raising 2 fingers.  General appearance: no acute distress, increased work of breathing is present.   Lymphatic: No lymphadenopathy about the head, neck, axilla. Eyes: No conjunctival inflammation or lid edema is present. There is no scleral icterus. Ears:  External ear exam shows no significant lesions or deformities.   Nose:  External nasal examination shows no deformity or inflammation. Nasal mucosa are pink and moist without lesions, exudates Oral exam:  Lips and gums are healthy appearing. There is no oropharyngeal erythema or exudate. Neck:  No thyromegaly, masses, tenderness noted.    Heart:  Normal rate and regular rhythm. S1 and S2 normal without gallop, murmur, click, rub .  Lungs: Chest clear to auscultation without wheezes, rhonchi, rales, rubs. Abdomen: Bowel sounds are normal. Abdomen is soft and nontender with no organomegaly, hernias, masses. GU: Deferred  Extremities:  No cyanosis, clubbing, edema  Neurologic exam :Balance, Rhomberg, finger to nose testing could not be completed due to clinical state Skin: Warm & dry w/o tenting.  No significant lesions or rash.  See summary under each active problem in the Problem List with associated updated therapeutic plan

## 2019-09-07 NOTE — Assessment & Plan Note (Signed)
Antihypertensive therapy will be based on the average of his blood pressures which have a significant range.

## 2019-09-07 NOTE — Patient Instructions (Signed)
See assessment and plan under each diagnosis in the problem list and acutely for this visit 

## 2019-09-07 NOTE — Assessment & Plan Note (Addendum)
PT/OT is employing ultrasound.  If response is not significant; consider topical agents to treat neuroma type pain.  I will discuss possible employing gabapentin with [facility] NP.

## 2019-09-21 ENCOUNTER — Non-Acute Institutional Stay (SKILLED_NURSING_FACILITY): Payer: Medicare Other | Admitting: Internal Medicine

## 2019-09-21 ENCOUNTER — Encounter: Payer: Self-pay | Admitting: Internal Medicine

## 2019-09-21 DIAGNOSIS — U071 COVID-19: Secondary | ICD-10-CM | POA: Insufficient documentation

## 2019-09-21 NOTE — Patient Instructions (Signed)
See assessment and plan under each diagnosis in the problem list and acutely for this visit 

## 2019-09-21 NOTE — Assessment & Plan Note (Addendum)
He can provide no meaningful history.  O2 sats 96% on room air.  On exam there is nothing to suggest active Covid infection. If Vikor test positive; consider monoclonal antibody infusion because of multiple comorbidities.

## 2019-09-21 NOTE — Progress Notes (Signed)
   NURSING HOME LOCATION:  [facility] ROOM NUMBER:  [id number]  CODE STATUS:  FULL CODE  PCP:  Hendricks Limes, MD  [address] [place] [address]  This is a nursing facility follow up for specific acute issue of positive rapid test for Covid.  Interim medical record and care since last [place] visit was updated with review of diagnostic studies and change in clinical status since last visit were documented.  HPI: His roommate had positive rapid testing as well as confirmation with Vikor testing and is being actively treated.  The confirmatory Vikor testing for this gentleman is pending.  He has received both Moderna vaccines for Covid. Significant comorbidities include peripheral vascular disease with history of stroke, dyslipidemia, essential hypertension, cardiomyopathy with history congestive heart failure, and prediabetes.  He is a former smoker.  [facility] NP follows the patient; there are no current labs in Epic.  Review of systems: Vascular dementia  invalidated responses.  Responses were not focused; he responded to me as "yes ma'am".  As I went through the review of systems for Covid infection; his response was "I cannot talk right now".  He was absorbed in watching a serial comedy on TV.  Constitutional: No fever, significant weight change, fatigue  Eyes: No redness, discharge, pain, vision change ENT/mouth: No nasal congestion,  purulent discharge, earache, change in hearing, sore throat  Cardiovascular: No chest pain, palpitations, paroxysmal nocturnal dyspnea, claudication, edema  Respiratory: No cough, sputum production, hemoptysis, DOE, significant snoring, apnea   Gastrointestinal: No heartburn, dysphagia, abdominal pain, nausea /vomiting, rectal bleeding, melena, change in bowels Genitourinary: No dysuria, hematuria, pyuria, incontinence, nocturia Dermatologic: No rash, pruritus, change in appearance of skin Hematologic/lymphatic: No significant bruising,  lymphadenopathy, abnormal bleeding  Physical exam:  Pertinent or positive findings: Bilateral ptosis is present.  He has arcus senilis.  He has 2 very large mandibular canines.  No abnormal cardiopulmonary findings auscultated.  Left AKA present.  The right ankle is dressed.  The right great toe is deviated laterally.  He has contractures of the right hand.  General appearance: Adequately nourished; no acute distress, increased work of breathing is present.   Lymphatic: No lymphadenopathy about the head, neck, axilla. Eyes: No conjunctival inflammation or lid edema is present. There is no scleral icterus. Ears:  External ear exam shows no significant lesions or deformities.   Nose:  External nasal examination shows no deformity or inflammation. Nasal mucosa are pink and moist without lesions, exudates Oral exam:  Lips and gums are healthy appearing. There is no oropharyngeal erythema or exudate. Neck:  No thyromegaly, masses, tenderness noted.    Heart:  Normal rate and regular rhythm. S1 and S2 normal without gallop, murmur, click, rub .  Lungs: Chest clear to auscultation without wheezes, rhonchi, rales, rubs. Abdomen: Bowel sounds are normal. Abdomen is soft and nontender with no organomegaly, hernias, masses. GU: Deferred  Extremities:  No cyanosis, clubbing, edema  Neurologic exam : Balance, Rhomberg, finger to nose testing could not be completed due to clinical state Skin: Warm & dry w/o tenting. No significant  rash.  See summary under each active problem in the Problem List with associated updated therapeutic plan

## 2019-10-18 LAB — BASIC METABOLIC PANEL
BUN: 11 (ref 4–21)
CO2: 27 — AB (ref 13–22)
Chloride: 105 (ref 99–108)
Creatinine: 1.1 (ref 0.6–1.3)
Glucose: 89
Potassium: 4.8 (ref 3.4–5.3)
Sodium: 142 (ref 137–147)

## 2019-10-18 LAB — HEPATIC FUNCTION PANEL
ALT: 42 — AB (ref 10–40)
AST: 23 (ref 14–40)
Alkaline Phosphatase: 96 (ref 25–125)
Bilirubin, Total: 0.2

## 2019-10-18 LAB — CBC: RBC: 5.27 — AB (ref 3.87–5.11)

## 2019-10-18 LAB — HEMOGLOBIN A1C: Hemoglobin A1C: 6.4

## 2019-10-18 LAB — LIPID PANEL
Cholesterol: 161 (ref 0–200)
HDL: 48 (ref 35–70)
LDL Cholesterol: 96
LDl/HDL Ratio: 3.4
Triglycerides: 88 (ref 40–160)

## 2019-10-18 LAB — COMPREHENSIVE METABOLIC PANEL
Albumin: 4 (ref 3.5–5.0)
Calcium: 9.8 (ref 8.7–10.7)
GFR calc Af Amer: 79.56
GFR calc non Af Amer: 68.65
Globulin: 3.4

## 2019-10-18 LAB — CBC AND DIFFERENTIAL
HCT: 42 (ref 41–53)
Hemoglobin: 13.7 (ref 13.5–17.5)
Neutrophils Absolute: 2
Platelets: 357 (ref 150–399)
WBC: 4

## 2019-10-18 LAB — VITAMIN D 25 HYDROXY (VIT D DEFICIENCY, FRACTURES): Vit D, 25-Hydroxy: 13.2

## 2019-11-23 ENCOUNTER — Non-Acute Institutional Stay (SKILLED_NURSING_FACILITY): Payer: Medicare Other | Admitting: Internal Medicine

## 2019-11-23 ENCOUNTER — Encounter: Payer: Self-pay | Admitting: Internal Medicine

## 2019-11-23 DIAGNOSIS — E1122 Type 2 diabetes mellitus with diabetic chronic kidney disease: Secondary | ICD-10-CM | POA: Diagnosis not present

## 2019-11-23 DIAGNOSIS — D509 Iron deficiency anemia, unspecified: Secondary | ICD-10-CM

## 2019-11-23 DIAGNOSIS — E782 Mixed hyperlipidemia: Secondary | ICD-10-CM

## 2019-11-23 DIAGNOSIS — I10 Essential (primary) hypertension: Secondary | ICD-10-CM | POA: Diagnosis not present

## 2019-11-23 DIAGNOSIS — N183 Chronic kidney disease, stage 3 unspecified: Secondary | ICD-10-CM

## 2019-11-23 NOTE — Progress Notes (Signed)
   NURSING HOME LOCATION:  [facility] ROOM NUMBER:  [id number]  CODE STATUS:  FULL CODE  PCP:  Hendricks Limes, MD  [address] [place] [address]   This is a nursing facility follow up of chronic medical diagnoses.  Interim medical record and care since last [place] visit was updated with review of diagnostic studies and change in clinical status since last visit were documented.  HPI: He is a permanent resident of the facility with medical diagnoses of prior left MCA stroke, history of polysubstance abuse, diabetes with PVD, dyslipidemia, essential hypertension, history of congestive heart failure in the context of cardiomyopathy, and post CVA aphasia & dysarthria. He has had multiple vascular procedures including fem-tib bypass graft and percutaneous stent.  He has had bilateral THAs. 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  Family history is strongly positive for stroke. Smoking and polysubstance abuse is remote history.  Review of systems: Vascular dementia invalidated responses.  Responses were typically undiscernible sounds with very few actual words.  He might respond positively only to quickly deny active symptoms.  Physical exam:  Pertinent or positive findings: Pattern alopecia is present; he is unshaven.  He has dense arcus senilis.  He has a lower partial and upper plate.  Pedal pulses in the right foot are decreased.  He has scarring with splotchy isolated hypopigmentation over the right shin.  Left AKA is present.  There is contracture of the right upper extremity at the elbow and wrist.  The right upper and lower extremities are weaker than the left upper extremity.  The right upper extremity is cool to touch without ischemic skin changes.  General appearance: Adequately nourished; no acute distress, increased work of breathing is present.   Lymphatic: No lymphadenopathy about the head, neck, axilla. Eyes: No conjunctival inflammation or lid edema is  present. There is no scleral icterus. Ears:  External ear exam shows no significant lesions or deformities.   Nose:  External nasal examination shows no deformity or inflammation. Nasal mucosa are pink and moist without lesions, exudates Neck:  No thyromegaly, masses, tenderness noted.    Heart:  No gallop, murmur, click, rub .  Lungs:  without wheezes, rhonchi, rales, rubs. Abdomen: Bowel sounds are normal. Abdomen is soft and nontender with no organomegaly, hernias, masses. GU: Deferred  Extremities:  No cyanosis, clubbing, edema  Neurologic exam :Balance, Rhomberg, finger to nose testing could not be completed due to clinical state Skin: Warm & dry w/o tenting. No significant lesions or rash.  See summary under each active problem in the Problem List with associated updated therapeutic plan

## 2019-11-23 NOTE — Assessment & Plan Note (Signed)
10/18/2019 LDL was 96.  With history of stroke LDL goal is less than 70.  Problematic however is his past history of rhabdomyolysis.  Also the ALT was 42 with normals less than 40.  This is not clinically significant.  This will be discussed with [facility] NP.

## 2019-11-23 NOTE — Assessment & Plan Note (Signed)
10/18/2019 anemia has resolved.

## 2019-11-23 NOTE — Assessment & Plan Note (Signed)
Although on 10/18/2019 A1c had risen to 6.4% (borderline or prediabetes) ,his renal function remains normal.  No change indicated.

## 2019-11-23 NOTE — Assessment & Plan Note (Signed)
BP controlled; no change in antihypertensive medications  

## 2019-11-23 NOTE — Patient Instructions (Signed)
See assessment and plan under each diagnosis in the problem list and acutely for this visit 

## 2020-01-01 ENCOUNTER — Other Ambulatory Visit: Payer: Self-pay

## 2020-01-01 ENCOUNTER — Emergency Department (HOSPITAL_COMMUNITY): Payer: Medicare Other

## 2020-01-01 ENCOUNTER — Encounter (HOSPITAL_COMMUNITY): Payer: Self-pay

## 2020-01-01 ENCOUNTER — Emergency Department (HOSPITAL_COMMUNITY)
Admission: EM | Admit: 2020-01-01 | Discharge: 2020-01-01 | Disposition: A | Discharge status: Home / Self Care | Payer: Medicare Other | Attending: Emergency Medicine | Admitting: Emergency Medicine

## 2020-01-01 DIAGNOSIS — I130 Hypertensive heart and chronic kidney disease with heart failure and stage 1 through stage 4 chronic kidney disease, or unspecified chronic kidney disease: Secondary | ICD-10-CM | POA: Diagnosis not present

## 2020-01-01 DIAGNOSIS — Z7982 Long term (current) use of aspirin: Secondary | ICD-10-CM | POA: Diagnosis not present

## 2020-01-01 DIAGNOSIS — Z87891 Personal history of nicotine dependence: Secondary | ICD-10-CM | POA: Diagnosis not present

## 2020-01-01 DIAGNOSIS — F0390 Unspecified dementia without behavioral disturbance: Secondary | ICD-10-CM | POA: Diagnosis not present

## 2020-01-01 DIAGNOSIS — Z96643 Presence of artificial hip joint, bilateral: Secondary | ICD-10-CM | POA: Diagnosis not present

## 2020-01-01 DIAGNOSIS — I5042 Chronic combined systolic (congestive) and diastolic (congestive) heart failure: Secondary | ICD-10-CM | POA: Diagnosis not present

## 2020-01-01 DIAGNOSIS — R52 Pain, unspecified: Secondary | ICD-10-CM

## 2020-01-01 DIAGNOSIS — M79605 Pain in left leg: Secondary | ICD-10-CM | POA: Insufficient documentation

## 2020-01-01 DIAGNOSIS — M25561 Pain in right knee: Secondary | ICD-10-CM | POA: Insufficient documentation

## 2020-01-01 DIAGNOSIS — G8929 Other chronic pain: Secondary | ICD-10-CM

## 2020-01-01 DIAGNOSIS — E1122 Type 2 diabetes mellitus with diabetic chronic kidney disease: Secondary | ICD-10-CM | POA: Diagnosis not present

## 2020-01-01 DIAGNOSIS — Z79899 Other long term (current) drug therapy: Secondary | ICD-10-CM | POA: Diagnosis not present

## 2020-01-01 DIAGNOSIS — N183 Chronic kidney disease, stage 3 unspecified: Secondary | ICD-10-CM | POA: Diagnosis not present

## 2020-01-01 DIAGNOSIS — I739 Peripheral vascular disease, unspecified: Secondary | ICD-10-CM

## 2020-01-01 LAB — CBC WITH DIFFERENTIAL/PLATELET
Abs Immature Granulocytes: 0.02 10*3/uL (ref 0.00–0.07)
Basophils Absolute: 0 10*3/uL (ref 0.0–0.1)
Basophils Relative: 0 %
Eosinophils Absolute: 0 10*3/uL (ref 0.0–0.5)
Eosinophils Relative: 0 %
HCT: 42.3 % (ref 39.0–52.0)
Hemoglobin: 13.1 g/dL (ref 13.0–17.0)
Immature Granulocytes: 0 %
Lymphocytes Relative: 12 %
Lymphs Abs: 0.8 10*3/uL (ref 0.7–4.0)
MCH: 25.8 pg — ABNORMAL LOW (ref 26.0–34.0)
MCHC: 31 g/dL (ref 30.0–36.0)
MCV: 83.4 fL (ref 80.0–100.0)
Monocytes Absolute: 0.5 10*3/uL (ref 0.1–1.0)
Monocytes Relative: 7 %
Neutro Abs: 5.5 10*3/uL (ref 1.7–7.7)
Neutrophils Relative %: 81 %
Platelets: 264 10*3/uL (ref 150–400)
RBC: 5.07 MIL/uL (ref 4.22–5.81)
RDW: 15.5 % (ref 11.5–15.5)
WBC: 6.9 10*3/uL (ref 4.0–10.5)
nRBC: 0 % (ref 0.0–0.2)

## 2020-01-01 LAB — BASIC METABOLIC PANEL
Anion gap: 13 (ref 5–15)
BUN: 23 mg/dL (ref 8–23)
CO2: 21 mmol/L — ABNORMAL LOW (ref 22–32)
Calcium: 9.2 mg/dL (ref 8.9–10.3)
Chloride: 105 mmol/L (ref 98–111)
Creatinine, Ser: 1.51 mg/dL — ABNORMAL HIGH (ref 0.61–1.24)
GFR, Estimated: 51 mL/min — ABNORMAL LOW (ref >60)
Glucose, Bld: 110 mg/dL — ABNORMAL HIGH (ref 70–99)
Potassium: 4.3 mmol/L (ref 3.5–5.1)
Sodium: 139 mmol/L (ref 135–145)

## 2020-01-01 MED ORDER — SODIUM CHLORIDE 0.9 % IV BOLUS
1000.0000 mL | Freq: Once | INTRAVENOUS | Status: AC
  Administered 2020-01-01: 1000 mL via INTRAVENOUS

## 2020-01-01 MED ORDER — HYDROCODONE-ACETAMINOPHEN 5-325 MG PO TABS
2.0000 | ORAL_TABLET | Freq: Once | ORAL | Status: AC
  Administered 2020-01-01: 2 via ORAL
  Filled 2020-01-01: qty 2

## 2020-01-01 NOTE — ED Notes (Signed)
Secretary has been asked to call [facility].

## 2020-01-01 NOTE — ED Provider Notes (Signed)
6:42 AM BP 100/67   Pulse (!) 108   Temp 98.2 F (36.8 C) (Oral)   Resp (!) 21   SpO2 92%  Patient here with stump pain from [place]. Awaiting labs . If neg d.c/ Left AKA- suspect phantom limb pain.  10:14 AM. BP 115/73   Pulse 84   Temp 98.2 F (36.8 C) (Oral)   Resp 16   SpO2 91%  I assessed the patient. He has a 1+ femoral pulse and warm, well perfused stump. I have  He has some apparent apahsia, but tells me the pain in his stump is chronic. Patient will be discharged at this time back to his SNF.      Margarita Mail, PA-C 01/01/20 [place], MD 01/01/20 1136

## 2020-01-01 NOTE — ED Provider Notes (Signed)
[facility] EMERGENCY DEPARTMENT Provider Note   CSN: [id number] Arrival date & time: 01/01/20  0334     History Chief Complaint  Patient presents with  . Leg Pain    Jeremy Howell is a 64 y.o. male.  Patient presents to the emergency department with chief complaint of left knee stump pain.  States that he has had worsening pain over the past couple of days.  He is concerned he may be getting an infection in his stump.  He denies any discharge or drainage.  Denies any redness.  Denies any fevers or chills.  He states that his regular pain medications have not been controlling his pain.  He has tried taking tramadol without relief.  Denies any other associated symptoms.  Denies any recent trauma.  The history is provided by the patient. No language interpreter was used.       Past Medical History:  Diagnosis Date  . Acute renal failure ([place])   . Amputee, above knee ([place])    left Above Knee Amputee- wheelchair bound  . Anemia   . BPH (benign prostatic hyperplasia)   . Cardiomyopathy    EF 20-25% by echo 01/2010 with severe LVH felt secondary to substance abuse (no ischemic workup)  . CHF (congestive heart failure) ([place])   . Dependent for walking   . Diabetes mellitus    Prediabetes  . Gangrene of foot ([place])   . HTN (hypertension)   . Hyperkalemia   . Hyperlipidemia   . Noncompliance   . Peripheral vascular disease ([place])   . Pneumonia   . Polysubstance abuse ([place])    Reported hx of cocaine, THC, heroin, EtOH abuse  . Streptococcal pneumonia ([place])   . Stroke [facility])    Left MCA CVA 01/2010 not felt to be Coumadin candidate at that time because of noncompliance  . Urinary retention   . Wheelchair bound     Patient Active Problem List   Diagnosis Date Noted  . Real time reverse transcriptase PCR positive for 2019 novel coronavirus 09/21/2019  . Controlled type 2 diabetes mellitus with stage 3 chronic kidney disease, without long-term current use of  insulin ([place]) 05/19/2018  . Essential hypertension 05/26/2016  . Adenomatous colon polyp 08/22/2015  . Heme positive stool   . H/O: substance abuse ([place])   . Enlarged prostate without urinary obstruction 02/25/2014  . Anemia, iron deficiency 02/25/2014  . H/O: stroke with residual effects 01/04/2014  . Constipation, chronic 06/05/2013  . S/P AKA (above knee amputation) ([place]) 03/23/2013  . Dementia without behavioral disturbance ([place]) 03/23/2013  . Depression with anxiety 03/23/2013  . CKD (chronic kidney disease) stage 3, GFR 30-59 ml/min ([facility]) 03/23/2013  . Hyperlipidemia   . Urinary retention 02/27/2013  . Hyperkalemia 02/27/2013  . Lower extremity pain 02/07/2013  . 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 Dehydration 10/23/2011  . Atherosclerosis of native arteries of the extremities with gangrene ([place]) 09/24/2011  . Gangrene of foot ([place]) 08/14/2011  . PVD (peripheral vascular disease) ([place]) 08/14/2011  . Metabolic acidosis 80/99/8338  . Rhabdomyolysis 08/14/2011  . 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 Hypertensive emergency 02/11/2011  . Pre-diabetes 02/11/2011  . Chronic combined mod- severe systolic (EF 25-05%) and grade 1 diastolic CHF (congestive heart failure) 02/11/2011  . Aphasia as late effect of cerebrovascular accident 02/11/2011    Past Surgical History:  Procedure Laterality Date  . ABDOMINAL ANGIOGRAM  02/12/2012  Procedure: ABDOMINAL ANGIOGRAM;  Surgeon: Elam Dutch, MD;  Location[facility] C[facility] LAB;  Service: Cardiovascular;;  . ABDOMINAL AORTAGRAM N/A 10/16/2011   Procedure: ABDOMINAL Maxcine Ham;  Surgeon: Elam Dutch, MD;  Location: [facility] CATH LAB;  Service: Cardiovascular;  Laterality: N/A;  . ABDOMINAL AORTAGRAM N/A 10/14/2012   Procedure: ABDOMINAL Maxcine Ham;  Surgeon: Elam Dutch, MD;  Location: [facility] CATH LAB;  Service: Cardiovascular;  Laterality: N/A;  . AMPUTATION  08/15/2011    Procedure: AMPUTATION ABOVE KNEE;  Surgeon: Elam Dutch, MD;  Location: [facility];  Service: Vascular;  Laterality: Left;  Amputation End:  1941  . COLONOSCOPY N/A 08/20/2015   Procedure: COLONOSCOPY;  Surgeon: Jerene Bears, MD;  Location: [facility] ENDOSCOPY;  Service: Gastroenterology;  Laterality: N/A;  . FEMORAL-TIBIAL BYPASS GRAFT  12/23/2011   Procedure: BYPASS GRAFT FEMORAL-TIBIAL ARTERY;  Surgeon: Elam Dutch, MD;  Location: [facility] OR;  Service: Vascular;  Laterality: Right;  Right Popliteal - Tibial Artery Bypass using Reversed Saphenous vein  . JOINT REPLACEMENT     BTHA  . LOWER EXTREMITY ANGIOGRAM N/A 02/12/2012   Procedure: LOWER EXTREMITY ANGIOGRAM;  Surgeon: Elam Dutch, MD;  Location: [facility] CATH LAB;  Service: Cardiovascular;  Laterality: N/A;  . PERCUTANEOUS STENT INTERVENTION  02/12/2012   Procedure: PERCUTANEOUS STENT INTERVENTION;  Surgeon: Elam Dutch, MD;  Location: [facility] CATH LAB;  Service: Cardiovascular;;  . TOTAL HIP ARTHROPLASTY Bilateral        Family History  Problem Relation Age of Onset  . Stroke Brother   . Emphysema Brother   . Stroke Mother   . Heart disease Mother   . Diabetes Mother   . Hepatitis Mother   . Clotting disorder Mother   . Alzheimer's disease Mother   . Pneumonia Father     Social History   Tobacco Use  . Smoking status: Former Smoker    Years: 15.00    Types: Cigarettes    Quit date: 08/10/2011    Years since quitting: 8.4  . Smokeless tobacco: Never Used  . Tobacco comment: declined  Substance Use Topics  . Alcohol use: No    Alcohol/week: 0.0 standard drinks  . Drug use: No    Comment: + previous history    Home Medications Prior to Admission medications   Medication Sig Start Date End Date Taking? Authorizing Provider  acetaminophen (TYLENOL) 325 MG tablet Take 650 mg by mouth every 6 (six) hours as needed for mild pain.     [provider]  aspirin EC 81 MG tablet Take 81 mg by mouth daily.    [provider]  carvedilol (COREG) 25 MG tablet Take 12.5 mg by mouth 2 (two) times daily. Hold for HR<65    [provider]  Cholecalciferol (VITAMIN D3) 1.25 MG (50000 UT) CAPS Take 1 capsule by mouth See admin instructions. Take on 1st and 15th of the month.    [provider]  docusate sodium (COLACE) 100 MG capsule Take 100 mg by mouth 2 (two) times daily.    [provider]  DULoxetine (CYMBALTA) 60 MG capsule Take 60 mg by mouth daily.    [provider]  ferrous sulfate 325 (65 FE) MG tablet Take 325 mg by mouth daily with breakfast.     [provider]  finasteride (PROSCAR) 5 MG tablet Take 5 mg by mouth daily.    [provider]  furosemide (LASIX) 20 MG tablet Take 20 mg by mouth daily.  [provider]  gabapentin (NEURONTIN) 100 MG capsule Take 100 mg by mouth 3 (three) times daily.     [provider]  Ipratropium-Albuterol (COMBIVENT RESPIMAT) 20-100 MCG/ACT AERS respimat Inhale 1-2 puffs into the lungs 4 (four) times daily as needed for wheezing.    [provider]  lamoTRIgine (LAMICTAL) 100 MG tablet Take 100 mg by mouth daily.  06/13/15   [provider]  Lidocaine (ASPERCREME LIDOCAINE) 4 % PTCH Apply 1 patch topically daily. Apply to left stump    [provider]  lisinopril (PRINIVIL,ZESTRIL) 10 MG tablet Take 10 mg by mouth daily. Hold if SBP <100 or SBP <60    [provider]  NON FORMULARY REGULAR, CCD, NAS    [provider]  omeprazole (PRILOSEC) 20 MG capsule Take 20 mg by mouth daily.    [provider]  polyethylene glycol (MIRALAX / GLYCOLAX) packet Take 17 g by mouth daily. 05/03/13   Pyrtle, Lajuan Lines, MD  Propylene Glycol (SYSTANE BALANCE) 0.6 % SOLN Place 2 drops into both eyes 4 (four) times daily.     [provider]  rosuvastatin (CRESTOR) 40 MG tablet Take 40 mg by mouth daily.    [provider]  tamsulosin (FLOMAX) 0.4  MG CAPS capsule Take 1 capsule (0.4 mg total) by mouth daily after breakfast. 03/02/13   Dellinger, Bobby Rumpf, PA-C  traMADol (ULTRAM) 50 MG tablet Take 50 mg by mouth every 6 (six) hours as needed for moderate pain or severe pain.    [provider]  vitamin C (ASCORBIC ACID) 500 MG tablet Take 500 mg by mouth daily with breakfast. To be given with iron    [provider]    Allergies    Almond oil  Review of Systems   Review of Systems  All other systems reviewed and are negative.   Physical Exam Updated Vital Signs BP (!) 152/82 (BP Location: Left Arm)   Pulse (!) 113   Temp 98.2 F (36.8 C) (Oral)   Resp 18   SpO2 94%   Physical Exam Nursing note and vitals reviewed.  Constitutional: Pt appears well-developed and well-nourished. No distress.  HENT:  Head: Normocephalic and atraumatic.  Eyes: Conjunctivae are normal.  Neck: Normal range of motion.  Cardiovascular: Normal rate, regular rhythm. Intact distal pulses.   Capillary refill < 3 sec.  Pulmonary/Chest: Effort normal and breath sounds normal.  Musculoskeletal:  Left AKA Pt exhibits TTP to the stump distally.  No erythema, induration, or sign of infection/abscess of the skin ROM: normal at left hip  Strength: normal at left hip  Neurological: Pt  is alert. Coordination normal.  Sensation: 5/5 Skin: Skin is warm and dry. Pt is not diaphoretic.  No evidence of open wound or skin tenting Psychiatric: Pt has a normal mood and affect.    ED Results / Procedures / Treatments   Labs (all labs ordered are listed, but only abnormal results are displayed) Labs Reviewed  CBC WITH DIFFERENTIAL/PLATELET  BASIC METABOLIC PANEL    EKG None  Radiology No results found.  Procedures Procedures (including critical care time)  Medications Ordered in ED Medications - No data to display  ED Course  I have reviewed the triage vital signs and the nursing notes.  Pertinent labs & imaging results  that were available during my care of the patient were reviewed by me and considered in my medical decision making (see chart for details).    MDM Rules/Calculators/A&P  Patient here with right knee pain.  Symptoms have been gradually worsening over the past 3 days.  Denies fevers.  States tramadol has not been helping.  There is no redness, no swelling, no induration.  There is some tenderness to palpation.  Plain films are negative.  Labs are pending.  Patient signed out to [place], [place], who will follow up on labs.  Anticipate discharge pending normal blood work.   Final Clinical Impression(s) / ED Diagnoses Final diagnoses:  Pain    Rx / DC Orders ED Discharge Orders    None       Montine Circle, PA-C 01/01/20 0645    Mesner, Corene Cornea, MD 01/01/20 [phone]

## 2020-01-01 NOTE — ED Triage Notes (Signed)
Pt brought to ED via EMS from [facility] with c/o left leg pain at amputation site, no bleeding or drainage present. Staff called EMS due to persistent pain despite tramadol administration. Pt has hx of dementia, DM2. Alert on arrival to ED, NAD.   EMS v/s: 146/92 110 HR 94% room air 163 CBG 98.6 tympanic

## 2020-01-17 ENCOUNTER — Telehealth: Payer: Self-pay

## 2020-01-17 NOTE — Telephone Encounter (Signed)
Returned [employer] from [employer] call regarding L leg "phantom pain" that the pt has been c/o. Advised her to call pain management or pt's PCP as the amputation is from several years ago. She verbalized understanding; no further questions/concerns at this time.

## 2020-02-20 ENCOUNTER — Encounter: Payer: Self-pay | Admitting: Internal Medicine

## 2020-03-07 ENCOUNTER — Non-Acute Institutional Stay (SKILLED_NURSING_FACILITY): Payer: Medicare Other | Admitting: Internal Medicine

## 2020-03-07 ENCOUNTER — Encounter: Payer: Self-pay | Admitting: Internal Medicine

## 2020-03-07 DIAGNOSIS — D509 Iron deficiency anemia, unspecified: Secondary | ICD-10-CM

## 2020-03-07 DIAGNOSIS — I10 Essential (primary) hypertension: Secondary | ICD-10-CM | POA: Diagnosis not present

## 2020-03-07 DIAGNOSIS — I5042 Chronic combined systolic (congestive) and diastolic (congestive) heart failure: Secondary | ICD-10-CM

## 2020-03-07 DIAGNOSIS — N1831 Chronic kidney disease, stage 3a: Secondary | ICD-10-CM | POA: Diagnosis not present

## 2020-03-07 DIAGNOSIS — E1122 Type 2 diabetes mellitus with diabetic chronic kidney disease: Secondary | ICD-10-CM | POA: Diagnosis not present

## 2020-03-07 DIAGNOSIS — N183 Chronic kidney disease, stage 3 unspecified: Secondary | ICD-10-CM

## 2020-03-07 DIAGNOSIS — Z89612 Acquired absence of left leg above knee: Secondary | ICD-10-CM

## 2020-03-07 NOTE — Assessment & Plan Note (Addendum)
He has a vascular appointment with Dr. Ruta Hinds 04/04/2020 for recurrent pain at the stump site without visible evidence of wound or cellulitis.

## 2020-03-07 NOTE — Assessment & Plan Note (Signed)
Current CBC and differential reveals anemia has resolved; the [facility] was minimally reduced at 25.8. [place] Kitchen

## 2020-03-07 NOTE — Patient Instructions (Signed)
See assessment and plan under each diagnosis in the problem list and acutely for this visit 

## 2020-03-07 NOTE — Assessment & Plan Note (Signed)
Current renal function reveals a creatinine of 1.51 and GFR 51 compatible with CKD stage IIIa. Potentially nephrotoxic medicines will be avoided.

## 2020-03-07 NOTE — Progress Notes (Signed)
   NURSING HOME LOCATION:  [facility]   ROOM NUMBER: [id number]   CODE STATUS: Full Code   PCP: Hendricks Limes, MD   This is a nursing facility follow up visit of chronic medical diagnoses  Interim medical record and care since last [place] visit was updated with review of diagnostic studies and change in clinical status since last visit were documented.  HPI: He is a permanent resident facility with medical diagnoses of history of stroke, history of polysubstance abuse, PVD, dyslipidemia, essential hypertension, history of gangrene of the foot, diabetes with vascular complications, history congestive heart failure, and history of AKI. He has had multiple vascular procedures as well as the limb amputation.  Review of systems: Aphasia precluded obtaining any meaningful history. His responses are singsong without any enunciation. I specifically asked him about pain at the stump and his nonverbal response was indefinite.   Physical exam:  Pertinent or positive findings: It was almost 2:00 but he was still in bed but fully dressed. Pattern alopecia is present. He is Geneticist, molecular. Dense arcus senilis is present. He is wearing an upper plate and lower partial. Second heart sound is slightly accentuated. Abdomen is protuberant. Left AKA is present. Pedal pulses in the right foot are decreased. There is contracture of the right hand.  General appearance: Adequately nourished; no acute distress, increased work of breathing is present.   Lymphatic: No lymphadenopathy about the head, neck, axilla. Eyes: No conjunctival inflammation or lid edema is present. There is no scleral icterus. Ears:  External ear exam shows no significant lesions or deformities.   Nose:  External nasal examination shows no deformity or inflammation. Nasal mucosa are pink and moist without lesions, exudates Neck:  No thyromegaly, masses, tenderness noted.    Heart:  Normal rate and  regular rhythm. S1  normal without gallop, murmur, click, rub .  Lungs:  without wheezes, rhonchi, rales, rubs. Abdomen: Bowel sounds are normal. Abdomen is soft and nontender with no organomegaly, hernias, masses. GU: Deferred  Extremities:  No cyanosis, clubbing, edema  Neurologic exam :Balance, Rhomberg, finger to nose testing could not be completed due to clinical state Skin: Warm & dry w/o tenting. No significant lesions or rash.  See summary under each active problem in the Problem List with associated updated therapeutic plan

## 2020-03-08 NOTE — Assessment & Plan Note (Signed)
BP controlled; no change in antihypertensive medications  

## 2020-03-13 ENCOUNTER — Other Ambulatory Visit: Payer: Self-pay

## 2020-03-13 DIAGNOSIS — I739 Peripheral vascular disease, unspecified: Secondary | ICD-10-CM

## 2020-04-04 ENCOUNTER — Other Ambulatory Visit: Payer: Self-pay

## 2020-04-04 ENCOUNTER — Ambulatory Visit (HOSPITAL_COMMUNITY)
Admission: RE | Admit: 2020-04-04 | Discharge: 2020-04-04 | Disposition: A | Discharge status: Home / Self Care | Payer: Medicare Other | Source: Ambulatory Visit | Attending: Vascular Surgery | Admitting: Vascular Surgery

## 2020-04-04 ENCOUNTER — Ambulatory Visit (INDEPENDENT_AMBULATORY_CARE_PROVIDER_SITE_OTHER): Payer: Medicare Other | Admitting: Vascular Surgery

## 2020-04-04 ENCOUNTER — Encounter: Payer: Self-pay | Admitting: Vascular Surgery

## 2020-04-04 VITALS — BP 147/87 | HR 68 | Temp 98.1°F | Resp 20 | Ht 67.0 in | Wt 155.0 lb

## 2020-04-04 DIAGNOSIS — I739 Peripheral vascular disease, unspecified: Secondary | ICD-10-CM

## 2020-04-04 NOTE — Progress Notes (Signed)
Patient is a 65 year old male who was referred apparently for pain in the left above-knee amputation stump.  Unfortunately the patient has an aphasia from prior stroke and is not really able to express himself.  He did not really complain of pain in the left above-knee amputation stump.  He did complain of some left shoulder pain today.  History was very difficult to obtain.  Patient did not have any family members or other people that are his caregivers present for the office visit.  His left above-knee amputation was in 2013.  He has also previously had a popliteal bypass in the right leg.  He has had a fairly debilitating stroke and has a right knee and right arm contracture.  Past Medical History:  Diagnosis Date  . Acute renal failure ([place])   . Amputee, above knee ([place])    left Above Knee Amputee- wheelchair bound  . Anemia   . BPH (benign prostatic hyperplasia)   . Cardiomyopathy    EF 20-25% by echo 01/2010 with severe LVH felt secondary to substance abuse (no ischemic workup)  . CHF (congestive heart failure) ([place])   . Dependent for walking   . Diabetes mellitus    Prediabetes  . Gangrene of foot ([place])   . HTN (hypertension)   . Hyperkalemia   . Hyperlipidemia   . Noncompliance   . Peripheral vascular disease ([place])   . Pneumonia   . Polysubstance abuse ([place])    Reported hx of cocaine, THC, heroin, EtOH abuse  . Streptococcal pneumonia ([place])   . Stroke [facility])    Left MCA CVA 01/2010 not felt to be Coumadin candidate at that time because of noncompliance  . Urinary retention   . Wheelchair bound     Past Surgical History:  Procedure Laterality Date  . ABDOMINAL ANGIOGRAM  02/12/2012   Procedure: ABDOMINAL ANGIOGRAM;  Surgeon: Elam Dutch, MD;  Location[facility] CATH LAB;  Service: Cardiovascular;;  . ABDOMINAL AORTAGRAM N/A 10/16/2011   Procedure: ABDOMINAL Maxcine Ham;  Surgeon: Elam Dutch, MD;  Location: [facility] CATH LAB;  Service: Cardiovascular;  Laterality: N/A;  . ABDOMINAL  AORTAGRAM N/A 10/14/2012   Procedure: ABDOMINAL Maxcine Ham;  Surgeon: Elam Dutch, MD;  Location: [facility] CATH LAB;  Service: Cardiovascular;  Laterality: N/A;  . AMPUTATION  08/15/2011   Procedure: AMPUTATION ABOVE KNEE;  Surgeon: Elam Dutch, MD;  Location: [facility];  Service: Vascular;  Laterality: Left;  Amputation End:  2671  . COLONOSCOPY N/A 08/20/2015   Procedure: COLONOSCOPY;  Surgeon: Jerene Bears, MD;  Location: [facility] ENDOSCOPY;  Service: Gastroenterology;  Laterality: N/A;  . FEMORAL-TIBIAL BYPASS GRAFT  12/23/2011   Procedure: BYPASS GRAFT FEMORAL-TIBIAL ARTERY;  Surgeon: Elam Dutch, MD;  Location: [facility] OR;  Service: Vascular;  Laterality: Right;  Right Popliteal - Tibial Artery Bypass using Reversed Saphenous vein  . JOINT REPLACEMENT     BTHA  . LOWER EXTREMITY ANGIOGRAM N/A 02/12/2012   Procedure: LOWER EXTREMITY ANGIOGRAM;  Surgeon: Elam Dutch, MD;  Location: [facility] CATH LAB;  Service: Cardiovascular;  Laterality: N/A;  . PERCUTANEOUS STENT INTERVENTION  02/12/2012   Procedure: PERCUTANEOUS STENT INTERVENTION;  Surgeon: Elam Dutch, MD;  Location: [facility] CATH LAB;  Service: Cardiovascular;;  . TOTAL HIP ARTHROPLASTY Bilateral     Current Outpatient Medications on File Prior to Visit  Medication Sig Dispense Refill  . acetaminophen (TYLENOL) 325 MG tablet Take 650 mg by mouth every 6 (six) hours as needed for mild pain or fever (>  100.4).    Marland Kitchen aspirin EC 81 MG tablet Take 81 mg by mouth daily.    . carvedilol (COREG) 12.5 MG tablet Take 12.5 mg by mouth 2 (two) times daily with a meal. For hypertension    . Cholecalciferol (VITAMIN D3) 1.25 MG (50000 UT) CAPS Take 1 capsule by mouth See admin instructions. Take on 1st and 15th of the month.    . DULoxetine (CYMBALTA) 60 MG capsule Take 60 mg by mouth daily.    . ferrous sulfate 325 (65 FE) MG tablet Take 325 mg by mouth daily with breakfast.     . finasteride (PROSCAR) 5 MG tablet Take 5 mg by mouth daily.    . furosemide  (LASIX) 20 MG tablet Take 20 mg by mouth daily.    Marland Kitchen gabapentin (NEURONTIN) 400 MG capsule Take 400 mg by mouth 3 (three) times daily. Do not crush    . Ipratropium-Albuterol (COMBIVENT RESPIMAT) 20-100 MCG/ACT AERS respimat Inhale 1-2 puffs into the lungs 4 (four) times daily as needed for wheezing.    . lamoTRIgine (LAMICTAL) 100 MG tablet Take 100 mg by mouth daily.     . Lidocaine 4 % PTCH Apply 1 patch topically daily. Apply to left stump    . lisinopril (PRINIVIL,ZESTRIL) 10 MG tablet Take 10 mg by mouth daily. Hold if SBP <100 or SBP <60    . NON FORMULARY REGULAR, CCD, NAS    . omeprazole (PRILOSEC) 20 MG capsule Take 20 mg by mouth daily.    . ondansetron (ZOFRAN) 4 MG tablet Take 4 mg by mouth every 6 (six) hours as needed for nausea or vomiting.    . polyethylene glycol (MIRALAX / GLYCOLAX) packet Take 17 g by mouth daily. 30 each 6  . Propylene Glycol 0.6 % SOLN Place 2 drops into both eyes 4 (four) times daily.    . rosuvastatin (CRESTOR) 40 MG tablet Take 40 mg by mouth daily.    Marland Kitchen senna-docusate (SENOKOT-S) 8.6-50 MG tablet Take 2 tablets by mouth 2 (two) times daily.    . tamsulosin (FLOMAX) 0.4 MG CAPS capsule Take 1 capsule (0.4 mg total) by mouth daily after breakfast. 30 capsule 0  . traMADol (ULTRAM) 50 MG tablet Take 50 mg by mouth every 6 (six) hours as needed for moderate pain or severe pain.     No current facility-administered medications on file prior to visit.    Review of systems: Unable to obtain due to patient's aphasia  Physical exam:  Vitals:   04/04/20 1242  BP: (!) 147/87  Pulse: 68  Resp: 20  Temp: 98.1 F (36.7 C)  SpO2: 96%  Weight: 155 lb (70.3 kg)  Height: 5\' 7"  (1.702 m)    Extremities: Well-healed left above-knee amputation no open ulcer or wound nontender to palpation, right lower extremity no palpable pedal pulses no wounds on the foot  Patient is in a wheelchair making examination of his femoral pulses more difficult  Data: Patient  had a ABI of his right leg today which was 0.8.  Assessment: Debilitated patient from prior stroke.  Apparently has some history of pain in the left above-knee amputation stump.  There are no wounds on the stump.  I was unable to elicit pain on palpation of the stump today.  Unfortunately patient does not communicate very well secondary to his stroke.  No real indication for vascular intervention or revision of the amputation at this point.  Plan: Patient will follow up with [place] on an  as-needed basis.  If he has continued pain in his left above-knee amputation stump he could have a referral to pain management services.  Ruta Hinds, MD [facility] Office: [phone]

## 2020-05-29 ENCOUNTER — Non-Acute Institutional Stay (SKILLED_NURSING_FACILITY): Payer: Medicare Other | Admitting: Internal Medicine

## 2020-05-29 ENCOUNTER — Encounter: Payer: Self-pay | Admitting: Internal Medicine

## 2020-05-29 DIAGNOSIS — E1122 Type 2 diabetes mellitus with diabetic chronic kidney disease: Secondary | ICD-10-CM

## 2020-05-29 DIAGNOSIS — M25512 Pain in left shoulder: Secondary | ICD-10-CM | POA: Diagnosis not present

## 2020-05-29 DIAGNOSIS — N1831 Chronic kidney disease, stage 3a: Secondary | ICD-10-CM

## 2020-05-29 DIAGNOSIS — Z89612 Acquired absence of left leg above knee: Secondary | ICD-10-CM | POA: Diagnosis not present

## 2020-05-29 DIAGNOSIS — N183 Chronic kidney disease, stage 3 unspecified: Secondary | ICD-10-CM

## 2020-05-29 DIAGNOSIS — G8929 Other chronic pain: Secondary | ICD-10-CM

## 2020-05-29 NOTE — Assessment & Plan Note (Signed)
Consider Voltaren gel trial if symptoms persist.

## 2020-05-29 NOTE — Patient Instructions (Signed)
See assessment and plan under each diagnosis in the problem list and acutely for this visit 

## 2020-05-29 NOTE — Assessment & Plan Note (Addendum)
He saw Dr. Oneida Alar 04/04/2020; no tenderness or wounds were present.  There was no indication for surgical revision of the stump.   He is on Duloxetine which should help neuropathic pain. Referral to the pain management clinic was recommended if symptoms persist or progress.

## 2020-05-29 NOTE — Assessment & Plan Note (Addendum)
Prior to the most recent renal function studies creatinine had been 1.1 and GFR approximately 80 in September 2021.  He remains on low dose ACE-I. Due to the progression to stage IIIa; renal function will need to be reassessed.  Nephrotoxic drugs should be avoided.

## 2020-05-29 NOTE — Assessment & Plan Note (Addendum)
Low dose ACE-I is prophylactic. He is not on Metformin. Updating the A1c will be discussed with [employer] NP

## 2020-05-29 NOTE — Progress Notes (Signed)
NURSING HOME LOCATION:  [facility] ROOM NUMBER:  [place]:  Full Code   PCP:  Pascal Lux MD  This is a nursing facility follow up visit of chronic medical diagnoses & to document compliance with Regulation 483.30 (c) in The [place] Manual Phase 2 which mandates caregiver visit ( visits can alternate among physician, PA or NP as per statutes) within 10 days of 30 days / 60 days/ 90 days post admission to SNF date    Interim medical record and care since last SNF visit was updated with review of diagnostic studies and change in clinical status since last visit were documented.  HPI: He is a permanent resident of the facility with medical diagnoses of dyslipidemia, essential hypertension, history of gangrene of the foot, diabetes with vascular complications, history of stroke, history of polysubstance abuse, history of AKI, and history of congestive heart failure.  He has had a lower extremity amputation as well as multiple invasive vascular procedures. Vascular surgeon Dr. Ruta Hinds consulted 04/04/2020 to evaluate apparent discomfort in the stump of the left AKA performed in 2013.  He also has had popliteal bypass in the right lower extremity.  Aphasia severely limited obtaining any history.  There was no tenderness on exam at the stump area.  ABI of the right leg was 0.8.  It was felt there was no indication for vascular intervention or revision of the amputation at this point.  Follow-up was to be as needed.  Referral to pain management services was recommended if were he to continue to have pain at the op site.  Review of systems: Aphasia prevented completion of review of systems ideally.  Most of his responses are a  monosyllabic "no" even before the question is completed.  He seems to indicate that he has left shoulder pain.  Dr. Oneida Alar had noted this and recommended orthopedic follow-up.  He pantomimes moving his left upper extremity up and  down and then pantomimes rotating the left wheel of his wheelchair.  As he performs these maneuvers he keeps saying "just now".  Physical exam:  Pertinent or positive findings: He was sitting in the wheelchair when I saw him mobilizing throughout the halls.  The mouth sags on the left.  Ptosis is present greater on the right than the left.  Arcus senilis is noted.  He has an upper plate and lower partial.  He has only 2 remaining mandibular canine teeth.  Breath sounds are decreased.  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  The right lower extremity pedal pulses are decreased.  The right upper extremity is flexed at the elbow and partially clawed in the hand.  Strength is fair in the right upper extremity and good in the right lower extremity.  However, when I asked him to lift his thigh against my hand he would extend the lower leg.  He has hyperpigmentation over the right shin.  General appearance: Adequately nourished; no acute distress, increased work of breathing is present.   Lymphatic: No lymphadenopathy about the head, neck, axilla. Eyes: No conjunctival inflammation or lid edema is present. There is no scleral icterus. Ears:  External ear exam shows no significant lesions or deformities.   Nose:  External nasal examination shows no deformity or inflammation. Nasal mucosa are pink and moist without lesions, exudates Neck:  No thyromegaly, masses, tenderness noted.    Heart:  No gallop, murmur, click, rub .  Lungs:  without wheezes,  rhonchi, rales, rubs. Abdomen: Bowel sounds are normal. Abdomen is soft and nontender with no organomegaly, hernias, masses. GU: Deferred  Extremities:  No cyanosis, clubbing, edema  Neurologic exam :Balance, Rhomberg, finger to nose testing could not be completed due to clinical state Skin: Warm & dry w/o tenting. No significant lesions or rash.  See summary under each active problem in the Problem List with associated updated therapeutic plan

## 2020-07-24 LAB — BASIC METABOLIC PANEL
BUN: 14 (ref 4–21)
CO2: 25 — AB (ref 13–22)
Chloride: 105 (ref 99–108)
Creatinine: 1.2 (ref <=1.3)
Glucose: 67
Potassium: 4.8 (ref 3.4–5.3)
Sodium: 144 (ref 137–147)

## 2020-07-24 LAB — CBC: RBC: 5.05 (ref 3.87–5.11)

## 2020-07-24 LAB — CBC AND DIFFERENTIAL
HCT: 41 (ref 41–53)
Hemoglobin: 13.1 — AB (ref 13.5–17.5)
Platelets: 281 (ref 150–399)
WBC: 4.1

## 2020-07-24 LAB — HEPATIC FUNCTION PANEL
ALT: 16 (ref 10–40)
AST: 13 — AB (ref 14–40)
Alkaline Phosphatase: 84 (ref 25–125)
Bilirubin, Total: 0.2

## 2020-07-24 LAB — LIPID PANEL
Cholesterol: 225 — AB (ref 0–200)
HDL: 53 (ref 35–70)
LDL Cholesterol: 152
Triglycerides: 105 (ref 40–160)

## 2020-07-24 LAB — COMPREHENSIVE METABOLIC PANEL
Albumin: 4 (ref 3.5–5.0)
Calcium: 9.3 (ref 8.7–10.7)
GFR calc Af Amer: 73.54
GFR calc non Af Amer: 63.45
Globulin: 2.8

## 2020-09-12 ENCOUNTER — Non-Acute Institutional Stay (SKILLED_NURSING_FACILITY): Payer: Medicare Other | Admitting: Internal Medicine

## 2020-09-12 ENCOUNTER — Encounter: Payer: Self-pay | Admitting: Internal Medicine

## 2020-09-12 DIAGNOSIS — D509 Iron deficiency anemia, unspecified: Secondary | ICD-10-CM | POA: Diagnosis not present

## 2020-09-12 DIAGNOSIS — I10 Essential (primary) hypertension: Secondary | ICD-10-CM

## 2020-09-12 DIAGNOSIS — E785 Hyperlipidemia, unspecified: Secondary | ICD-10-CM

## 2020-09-12 DIAGNOSIS — N183 Chronic kidney disease, stage 3 unspecified: Secondary | ICD-10-CM

## 2020-09-12 DIAGNOSIS — M79605 Pain in left leg: Secondary | ICD-10-CM

## 2020-09-12 NOTE — Progress Notes (Signed)
   NURSING HOME LOCATION:  [facility] ROOM NUMBER:  [place]:  Full Code  PCP:  [place] NP  This is a nursing facility follow up visit of chronic medical diagnoses & to document compliance with Regulation 483.30 (c) in The [place] Manual Phase 2 which mandates caregiver visit ( visits can alternate among physician, PA or NP as per statutes) within 10 days of 30 days / 60 days/ 90 days post admission to SNF date    Interim medical record and care since last SNF visit was updated with review of diagnostic studies and change in clinical status since last visit were documented.  HPI: He is a permanent resident of this facility with medical diagnoses of BPH, cardiomyopathy, history of congestive heart failure, diabetes with peripheral vascular disease, essential hypertension, dyslipidemia, history of stroke, remote history of polysubstance abuse, and history of chronic anemia. He has had multiple vascular procedures as well as colonoscopy, bilateral THA, and L AKA.  Most recent labs were completed 07/24/2020.  Chemistry and electrolytes were excellent except for mild reduction in glucose to  67.  Creatinine was 1.19 with a GFR of 73.54.  He exhibited a mild anemia with H/H of 13.1/40.6 with minimal hypochromic indices.  White count and differential were normal.  Lipids revealed an LDL of 152 and HDL of 53 on generic Crestor 40 mg qd.  Review of systems: ROS could not be completed due to marked dysarthria & dysphasia in the context of prior stroke and confabulation.  Responses were garbled and virtually unintelligible.  He would make intelligible sounds intermittently with responses such as "good" but then followed by "no, no, no".  He also tends to pantomime with his left arm as he attempts to answer questions.  He does seem to validate that he is not having the phantom pain in the left lower extremity where he had AKA.  Physical exam:   Pertinent or positive findings: He has alopecia.  He has dense arcus senilis.  The corner of the mouth droop slightly on the left.  He has only 2 remaining mandibular teeth.  Pedal pulses of the right lower extremity are decreased.  AKA present on the left.  The right upper extremity is flexed and the hand is contracted.  He is strong in the left upper extremity to opposition.  There are sclerotic hyperpigmented changes over the right shin.  General appearance: Adequately nourished; no acute distress, increased work of breathing is present.   Lymphatic: No lymphadenopathy about the head, neck, axilla. Eyes: No conjunctival inflammation or lid edema is present. There is no scleral icterus. Ears:  External ear exam shows no significant lesions or deformities.   Nose:  External nasal examination shows no deformity or inflammation. Nasal mucosa are pink and moist without lesions, exudates Neck:  No thyromegaly, masses, tenderness noted.    Heart:  Normal rate and regular rhythm. S1 and S2 normal without gallop, murmur, click, rub .  Lungs: Chest clear to auscultation without wheezes, rhonchi, rales, rubs. Abdomen: Bowel sounds are normal. Abdomen is soft and nontender with no organomegaly, hernias, masses. GU: Deferred  Extremities:  No cyanosis, clubbing, edema  Neurologic exam :Balance, Rhomberg, finger to nose testing could not be completed due to clinical state Skin: Warm & dry w/o tenting. No significant rash.  See summary under each active problem in the Problem List with associated updated therapeutic plan

## 2020-09-12 NOTE — Assessment & Plan Note (Signed)
Minimal anemia w H/H 13.1/40.6 w/o bleeding dyscrasias Continue monitor

## 2020-09-12 NOTE — Assessment & Plan Note (Addendum)
07/24/20 Creat 1.19/GFR 73.54, CKD Stage 2 Medication List reviewed; on ACE-I & Gabapentin . Dosage adjustment of these if CKD progresses

## 2020-09-12 NOTE — Assessment & Plan Note (Signed)
BP controlled; no change in antihypertensive medications  

## 2020-09-12 NOTE — Assessment & Plan Note (Addendum)
07/24/20 LDL 152 supposedly on Crestor 40 mg qd. Discuss w Erick Blinks NP

## 2020-09-12 NOTE — Assessment & Plan Note (Signed)
Phantom pain appears to have resolved

## 2020-09-12 NOTE — Patient Instructions (Signed)
See assessment and plan under each diagnosis in the problem list and acutely for this visit 

## 2020-10-14 ENCOUNTER — Encounter: Payer: Self-pay | Admitting: Internal Medicine

## 2021-03-15 ENCOUNTER — Emergency Department (HOSPITAL_COMMUNITY): Payer: Medicare Other

## 2021-03-15 ENCOUNTER — Emergency Department (HOSPITAL_COMMUNITY)
Admission: EM | Admit: 2021-03-15 | Discharge: 2021-03-15 | Disposition: A | Discharge status: Home / Self Care | Payer: Medicare Other | Attending: Emergency Medicine | Admitting: Emergency Medicine

## 2021-03-15 ENCOUNTER — Other Ambulatory Visit: Payer: Self-pay

## 2021-03-15 ENCOUNTER — Encounter (HOSPITAL_COMMUNITY): Payer: Self-pay

## 2021-03-15 DIAGNOSIS — I509 Heart failure, unspecified: Secondary | ICD-10-CM | POA: Insufficient documentation

## 2021-03-15 DIAGNOSIS — R109 Unspecified abdominal pain: Secondary | ICD-10-CM

## 2021-03-15 DIAGNOSIS — I110 Hypertensive heart disease with heart failure: Secondary | ICD-10-CM | POA: Insufficient documentation

## 2021-03-15 DIAGNOSIS — S0990XA Unspecified injury of head, initial encounter: Secondary | ICD-10-CM | POA: Insufficient documentation

## 2021-03-15 DIAGNOSIS — E119 Type 2 diabetes mellitus without complications: Secondary | ICD-10-CM | POA: Insufficient documentation

## 2021-03-15 DIAGNOSIS — R112 Nausea with vomiting, unspecified: Secondary | ICD-10-CM | POA: Insufficient documentation

## 2021-03-15 DIAGNOSIS — K5900 Constipation, unspecified: Secondary | ICD-10-CM | POA: Diagnosis not present

## 2021-03-15 DIAGNOSIS — Z20822 Contact with and (suspected) exposure to covid-19: Secondary | ICD-10-CM | POA: Insufficient documentation

## 2021-03-15 DIAGNOSIS — Z7982 Long term (current) use of aspirin: Secondary | ICD-10-CM | POA: Diagnosis not present

## 2021-03-15 DIAGNOSIS — W19XXXA Unspecified fall, initial encounter: Secondary | ICD-10-CM | POA: Diagnosis not present

## 2021-03-15 DIAGNOSIS — Z79899 Other long term (current) drug therapy: Secondary | ICD-10-CM | POA: Insufficient documentation

## 2021-03-15 DIAGNOSIS — R059 Cough, unspecified: Secondary | ICD-10-CM | POA: Diagnosis not present

## 2021-03-15 DIAGNOSIS — K8020 Calculus of gallbladder without cholecystitis without obstruction: Secondary | ICD-10-CM | POA: Insufficient documentation

## 2021-03-15 LAB — URINALYSIS, MICROSCOPIC (REFLEX): Squamous Epithelial / HPF: NONE SEEN (ref 0–5)

## 2021-03-15 LAB — URINALYSIS, ROUTINE W REFLEX MICROSCOPIC
Bilirubin Urine: NEGATIVE
Glucose, UA: NEGATIVE mg/dL
Ketones, ur: NEGATIVE mg/dL
Leukocytes,Ua: NEGATIVE
Nitrite: NEGATIVE
Protein, ur: NEGATIVE mg/dL
Specific Gravity, Urine: 1.025 (ref 1.005–1.030)
pH: 5.5 (ref 5.0–8.0)

## 2021-03-15 LAB — CBC WITH DIFFERENTIAL/PLATELET
Abs Immature Granulocytes: 0.01 10*3/uL (ref 0.00–0.07)
Basophils Absolute: 0 10*3/uL (ref 0.0–0.1)
Basophils Relative: 1 %
Eosinophils Absolute: 0.2 10*3/uL (ref 0.0–0.5)
Eosinophils Relative: 3 %
HCT: 42.9 % (ref 39.0–52.0)
Hemoglobin: 13.5 g/dL (ref 13.0–17.0)
Immature Granulocytes: 0 %
Lymphocytes Relative: 25 %
Lymphs Abs: 1.3 10*3/uL (ref 0.7–4.0)
MCH: 26.4 pg (ref 26.0–34.0)
MCHC: 31.5 g/dL (ref 30.0–36.0)
MCV: 84 fL (ref 80.0–100.0)
Monocytes Absolute: 0.5 10*3/uL (ref 0.1–1.0)
Monocytes Relative: 10 %
Neutro Abs: 3.1 10*3/uL (ref 1.7–7.7)
Neutrophils Relative %: 61 %
Platelets: 319 10*3/uL (ref 150–400)
RBC: 5.11 MIL/uL (ref 4.22–5.81)
RDW: 16.1 % — ABNORMAL HIGH (ref 11.5–15.5)
WBC: 5.1 10*3/uL (ref 4.0–10.5)
nRBC: 0 % (ref 0.0–0.2)

## 2021-03-15 LAB — COMPREHENSIVE METABOLIC PANEL
ALT: 16 U/L (ref 0–44)
AST: 18 U/L (ref 15–41)
Albumin: 3.6 g/dL (ref 3.5–5.0)
Alkaline Phosphatase: 65 U/L (ref 38–126)
Anion gap: 11 (ref 5–15)
BUN: 35 mg/dL — ABNORMAL HIGH (ref 8–23)
CO2: 28 mmol/L (ref 22–32)
Calcium: 9.7 mg/dL (ref 8.9–10.3)
Chloride: 101 mmol/L (ref 98–111)
Creatinine, Ser: 2.11 mg/dL — ABNORMAL HIGH (ref 0.61–1.24)
GFR, Estimated: 34 mL/min — ABNORMAL LOW (ref >60)
Glucose, Bld: 86 mg/dL (ref 70–99)
Potassium: 4.5 mmol/L (ref 3.5–5.1)
Sodium: 140 mmol/L (ref 135–145)
Total Bilirubin: 0.2 mg/dL — ABNORMAL LOW (ref 0.3–1.2)
Total Protein: 7.2 g/dL (ref 6.5–8.1)

## 2021-03-15 LAB — RESP PANEL BY RT-PCR (FLU A&B, COVID) ARPGX2
Influenza A by PCR: NEGATIVE
Influenza B by PCR: NEGATIVE
SARS Coronavirus 2 by RT PCR: NEGATIVE

## 2021-03-15 LAB — TSH: TSH: 1.379 u[IU]/mL (ref 0.350–4.500)

## 2021-03-15 LAB — PROTIME-INR
INR: 1 (ref 0.8–1.2)
Prothrombin Time: 13.2 seconds (ref 11.4–15.2)

## 2021-03-15 LAB — LACTIC ACID, PLASMA
Lactic Acid, Venous: 2.8 mmol/L (ref 0.5–1.9)
Lactic Acid, Venous: 1.6 mmol/L (ref 0.5–1.9)

## 2021-03-15 LAB — LIPASE, BLOOD: Lipase: 61 U/L — ABNORMAL HIGH (ref 11–51)

## 2021-03-15 MED ORDER — ONDANSETRON HCL 4 MG/2ML IJ SOLN
4.0000 mg | Freq: Once | INTRAMUSCULAR | Status: AC
  Administered 2021-03-15: 4 mg via INTRAVENOUS
  Filled 2021-03-15: qty 2

## 2021-03-15 MED ORDER — LACTATED RINGERS IV BOLUS
1000.0000 mL | Freq: Once | INTRAVENOUS | Status: AC
  Administered 2021-03-15: 1000 mL via INTRAVENOUS

## 2021-03-15 MED ORDER — IOHEXOL 300 MG/ML  SOLN
75.0000 mL | Freq: Once | INTRAMUSCULAR | Status: AC | PRN
  Administered 2021-03-15: 75 mL via INTRAVENOUS

## 2021-03-15 NOTE — ED Notes (Signed)
IV infiltrated upon assessment. Placed another IV team consult.

## 2021-03-15 NOTE — ED Notes (Signed)
IV team at bedside 

## 2021-03-15 NOTE — ED Notes (Signed)
Pt transported to CT ?

## 2021-03-15 NOTE — ED Provider Notes (Signed)
Care of the patient assumed at the change of shift. [place] with prior stroke and L AKA here after being found half out of bed. He has also had abdominal pain, vomiting and distention concerning for SBO. Pending labs, CT head and CT Abd/Pel.  Physical Exam  BP (!) 141/86    Pulse 66    Temp 97.9 F (36.6 C) (Oral)    Resp 11    Ht 5\' 7"  (1.702 m)    Wt 71.5 kg    SpO2 96%    BMI 24.69 kg/m   Physical Exam  Procedures  Procedures  ED Course / MDM   Clinical Course as of 03/15/21 2059  Sat Mar 15, 2021  1646 UA with WBC but no other signs of infection, WBC is normal not febrile. Will send for culture but not in need of treatment at this time.  [CS]  6803 CBC is normal. CMP shows AKI, prior Cr was 1.2. Covid/Flu are neg.  [CS]  2122 CT head shows no acute process, evidence of prior stroke.  [CS]  4825 CT with possible cholecystitis, recommends [place]. No leukocytosis or signs of biliary obstruction. Will check [place] and re-evaluate.  [CS]  2046 [place] with gall stones, but no cholecystitis. Plan discharge back to SNF. Outpatient general surgery follow up for further management.  [CS]    Clinical Course User Index [CS] Truddie Hidden, MD   Medical Decision Making Problems Addressed: Calculus of gallbladder without cholecystitis without obstruction: undiagnosed new problem with uncertain prognosis Fall, initial encounter: acute illness or injury that poses a threat to life or bodily functions  Amount and/or Complexity of Data Reviewed Labs: ordered. Decision-making details documented in ED Course. Radiology: ordered and independent interpretation performed. Decision-making details documented in ED Course.  Risk Prescription drug management. Decision regarding hospitalization.          Truddie Hidden, MD 03/15/21 2100

## 2021-03-15 NOTE — ED Notes (Signed)
Pt discharged and wheeled out of the ED on a stretcher with [facility] crew.

## 2021-03-15 NOTE — ED Triage Notes (Signed)
Pt arrived to ED via EMS from [place] w/ c/o fall. Pt was found w/ his torso in the floor and his legs on the bed w/ vomit around him. Upon EMS arrival pt had been placed back in bed and cleaned up. Staff told EMS that pt hasn't been acting right, but could not tell EMS what pt baseline is or how he isn't acting right. Pt w/ hx or dementia and L MCA CVA w/ R side deficits and aphasia. Pt A&Ox2 w/ EMS VSS w/ EMS.

## 2021-03-15 NOTE — ED Notes (Signed)
Attempted IV start x 2 unsuccessfully. Placing IV team consult

## 2021-03-15 NOTE — ED Provider Notes (Signed)
[facility] EMERGENCY DEPARTMENT Provider Note   CSN: [id number] Arrival date & time: 03/15/21  1313     History  Chief Complaint  Patient presents with   Francoise Ceo is a 66 y.o. male.  The history is provided by the patient, the EMS personnel and medical records. The history is limited by the condition of the patient. No language interpreter was used.  Fall This is a new problem. The current episode started less than 1 hour ago. The problem has been resolved. Associated symptoms include abdominal pain. Pertinent negatives include no chest pain, no headaches and no shortness of breath. Nothing aggravates the symptoms. Nothing relieves the symptoms. He has tried nothing for the symptoms. The treatment provided no relief.      Home Medications Prior to Admission medications   Medication Sig Start Date End Date Taking? Authorizing Provider  acetaminophen (TYLENOL) 325 MG tablet Take 650 mg by mouth every 6 (six) hours as needed for mild pain or fever (> 100.4).    [provider]  aspirin EC 81 MG tablet Take 81 mg by mouth daily.    [provider]  baclofen (LIORESAL) 10 MG tablet Take 5 mg by mouth 3 (three) times daily.    [provider]  Benzocaine (ORAJEL MT) Rub orajel on affected area as needed 3X MOUTH SORES    [provider]  carvedilol (COREG) 12.5 MG tablet Take 12.5 mg by mouth 2 (two) times daily with a meal. For hypertension    [provider]  Cholecalciferol (VITAMIN D3) 1.25 MG (50000 UT) CAPS Take 1 capsule by mouth See admin instructions. Take on 1st and 15th of the month.    [provider]  DULoxetine (CYMBALTA) 60 MG capsule Take 60 mg by mouth daily.    [provider]  ferrous sulfate 325 (65 FE) MG tablet Take 325 mg by mouth daily with breakfast.     [provider]  finasteride (PROSCAR) 5 MG tablet Take 5 mg by mouth daily.    [provider]   furosemide (LASIX) 20 MG tablet Take 20 mg by mouth daily.    [provider]  gabapentin (NEURONTIN) 400 MG capsule Take 400 mg by mouth 3 (three) times daily. Do not crush    [provider]  Ipratropium-Albuterol (COMBIVENT RESPIMAT) 20-100 MCG/ACT AERS respimat Inhale 1-2 puffs into the lungs 4 (four) times daily as needed for wheezing.    [provider]  lamoTRIgine (LAMICTAL) 100 MG tablet Take 100 mg by mouth daily.  06/13/15   [provider]  Lidocaine 4 % PTCH Apply 1 patch topically daily. Apply to left stump    [provider]  lisinopril (PRINIVIL,ZESTRIL) 10 MG tablet Take 10 mg by mouth daily. Hold if SBP <100 or SBP <60    [provider]  NON FORMULARY REGULAR, CCD, NAS    [provider]  omeprazole (PRILOSEC) 20 MG capsule Take 20 mg by mouth daily.    [provider]  ondansetron (ZOFRAN) 4 MG tablet Take 4 mg by mouth every 6 (six) hours as needed for nausea or vomiting.    [provider]  polyethylene glycol (MIRALAX / GLYCOLAX) packet Take 17 g by mouth daily. 05/03/13   Pyrtle, Lajuan Lines, MD  Propylene Glycol 0.6 % SOLN Place 2 drops into both eyes 4 (four) times daily.    [provider]  rosuvastatin (CRESTOR) 40 MG tablet  Take 40 mg by mouth daily.    [provider]  senna-docusate (SENOKOT-S) 8.6-50 MG tablet Take 2 tablets by mouth 2 (two) times daily.    [provider]  tamsulosin (FLOMAX) 0.4 MG CAPS capsule Take 1 capsule (0.4 mg total) by mouth daily after breakfast. 03/02/13   Dellinger, Bobby Rumpf, PA-C  traMADol (ULTRAM) 50 MG tablet Take 50 mg by mouth every 6 (six) hours as needed for moderate pain or severe pain.    [provider]      Allergies    Almond oil    Review of Systems   Review of Systems  Constitutional:  Negative for chills, diaphoresis, fatigue and fever.  HENT:  Negative for congestion.   Respiratory:  Positive for cough.  Negative for chest tightness, shortness of breath and wheezing.   Cardiovascular:  Negative for chest pain, palpitations and leg swelling.  Gastrointestinal:  Positive for abdominal pain, constipation, nausea and vomiting. Negative for diarrhea.  Genitourinary:  Negative for dysuria, flank pain and frequency.  Musculoskeletal:  Negative for back pain, neck pain and neck stiffness.  Skin:  Negative for rash and wound.  Neurological:  Negative for light-headedness and headaches.  Psychiatric/Behavioral:  Negative for agitation.    Physical Exam Updated Vital Signs BP (!) 157/88    Pulse 70    Temp 97.9 F (36.6 C) (Oral)    Resp 16    Ht 5\' 7"  (1.702 m)    Wt 71.5 kg    SpO2 97%    BMI 24.69 kg/m  Physical Exam Vitals and nursing note reviewed.  Constitutional:      General: He is not in acute distress.    Appearance: He is well-developed. He is not ill-appearing, toxic-appearing or diaphoretic.  HENT:     Head: Normocephalic and atraumatic.     Nose: Nose normal.     Mouth/Throat:     Mouth: Mucous membranes are moist.  Eyes:     Extraocular Movements: Extraocular movements intact.     Conjunctiva/sclera: Conjunctivae normal.     Pupils: Pupils are equal, round, and reactive to light.  Cardiovascular:     Rate and Rhythm: Normal rate and regular rhythm.     Heart sounds: No murmur heard. Pulmonary:     Effort: Pulmonary effort is normal. No respiratory distress.     Breath sounds: Normal breath sounds. No wheezing or rhonchi.  Chest:     Chest wall: No tenderness.  Abdominal:     General: Abdomen is flat.     Palpations: Abdomen is soft.     Tenderness: There is abdominal tenderness. There is no right CVA tenderness, guarding or rebound.  Musculoskeletal:        General: No swelling or tenderness.     Cervical back: Neck supple. No tenderness.     Comments: L AKA  Skin:    General: Skin is warm and dry.     Capillary Refill: Capillary refill takes less than 2 seconds.      Findings: No erythema or rash.  Neurological:     Mental Status: He is alert. Mental status is at baseline.  Psychiatric:        Mood and Affect: Mood normal.    ED Results / Procedures / Treatments   Labs (all labs ordered are listed, but only abnormal results are displayed) Labs Reviewed  CBC WITH DIFFERENTIAL/PLATELET - Abnormal; Notable for the following components:      Result Value  RDW 16.1 (*)    All other components within normal limits  URINE CULTURE  RESP PANEL BY RT-PCR (FLU A&B, COVID) ARPGX2  PROTIME-INR  TSH  COMPREHENSIVE METABOLIC PANEL  LIPASE, BLOOD  LACTIC ACID, PLASMA  LACTIC ACID, PLASMA  URINALYSIS, ROUTINE W REFLEX MICROSCOPIC    EKG EKG Interpretation  Date/Time:  Saturday March 15 2021 13:23:28 EST Ventricular Rate:  72 PR Interval:  169 QRS Duration: 119 QT Interval:  408 QTC Calculation: 447 R Axis:   58 Text Interpretation: Sinus rhythm Nonspecific intraventricular conduction delay Baseline wander in lead(s) V6 when compared to prior, more artifact. No STEMI Confirmed by Antony Blackbird [phone]) on 03/15/2021 1:34:17 PM  Radiology DG Chest Portable 1 View  Result Date: 03/15/2021 CLINICAL DATA:  Fall and cough. EXAM: PORTABLE CHEST 1 VIEW COMPARISON:  01/01/2020 chest radiograph and prior studies FINDINGS: The cardiomediastinal silhouette is unremarkable. Mild LEFT basilar atelectasis/scarring noted. There is no evidence of focal airspace disease, pulmonary edema, suspicious pulmonary nodule/mass, pleural effusion, or pneumothorax. No acute bony abnormalities are identified. Remote LEFT rib fractures are again identified. IMPRESSION: Mild LEFT basilar atelectasis/scarring. Electronically Signed   By: Margarette Canada M.D.   On: 03/15/2021 14:06    Procedures Procedures    Medications Ordered in ED Medications  ondansetron (ZOFRAN) injection 4 mg (has no administration in time range)    ED Course/ Medical Decision Making/ A&P                            Medical Decision Making Amount and/or Complexity of Data Reviewed Labs: ordered. Radiology: ordered.  Risk Prescription drug management.   JOAKIM HUESMAN is a 66 y.o. male with a past medical history significant for previous stroke with aphasia and right-sided deficits at baseline, hypertension, left AKA, CHF, hyperlipidemia, diabetes, and peripheral vascular disease who presents with nausea, vomiting, abdominal pain, decreased flatus, abdominal distention, cough, and a fall.  According to EMS, patient was found with his leg still in the bed but his torso on the ground including his head.  He says that he has had nausea and vomiting for the last week or so and has been having abdominal pain with distention.  He could not tell me the number for the pain but it is moderate.  He reports nausea and vomiting when he eats and drinks and reports decreased stool and no flatus.  Denies any urinary changes.  Denies fevers or chills but does report a cough.  Denies chest pain or shortness of breath.  Denies any other complaints and denies any headache at this time.  Denies neck pain.  On exam, lungs clear and chest nontender.  Abdomen is tender to palpation and slightly distended.  Bowel sounds were appreciated however.  Neck and back nontender.  No evidence of head trauma.  Patient has some contractures on the right side which she reports reports is at baseline.  Otherwise patient resting.  Per EMS, patient is at his mental status baseline now but the facility at 1 point said he was not quite acting right but could not describe focally what was different.  We will order nausea medicine for the patient and start his work-up.  With his history of stroke, the transient altered mental status, and fall hitting his head, will get CT head.  Will get chest x-ray with his cough and will also get CT abdomen pelvis given the possible small bowel obstruction with the nausea, vomiting,  abdominal pain,  distention, and decreased flatus.  Will get urinalysis for the abdominal discomfort as well and will get screening labs.  Anticipate reassessment after work-up to determine disposition.  Care transferred to oncoming team awaiting for CT of the pelvis and CT of the head.  Anticipate reassessment after work-up.         Final Clinical Impression(s) / ED Diagnoses Final diagnoses:  Fall, initial encounter  Cough, unspecified type  Abdominal pain, unspecified abdominal location  Nausea and vomiting, unspecified vomiting type    Clinical Impression: 1. Fall, initial encounter   2. Cough, unspecified type   3. Abdominal pain, unspecified abdominal location   4. Nausea and vomiting, unspecified vomiting type     Disposition: Care transferred to oncoming team awaiting for CT of the pelvis and CT of the head.  Anticipate reassessment after work-up.  This note was prepared with assistance of Systems analyst. Occasional wrong-word or sound-a-like substitutions may have occurred due to the inherent limitations of voice recognition software.       Willeen Novak, Gwenyth Allegra, MD 03/15/21 [phone]

## 2021-03-16 LAB — URINE CULTURE: Culture: <10000 — AB
